# Patient Record
Sex: Female | Born: 1970
Health system: Southern US, Community
[De-identification: ages and names within clinical notes are randomized; demographics above are authoritative.]

## PROBLEM LIST (undated history)

## (undated) DIAGNOSIS — R569 Unspecified convulsions: Secondary | ICD-10-CM

## (undated) DIAGNOSIS — G43019 Migraine without aura, intractable, without status migrainosus: Secondary | ICD-10-CM

## (undated) DIAGNOSIS — J9600 Acute respiratory failure, unspecified whether with hypoxia or hypercapnia: Secondary | ICD-10-CM

## (undated) DIAGNOSIS — I6930 Unspecified sequelae of cerebral infarction: Secondary | ICD-10-CM

## (undated) DIAGNOSIS — G43409 Hemiplegic migraine, not intractable, without status migrainosus: Secondary | ICD-10-CM

## (undated) DIAGNOSIS — R51 Headache: Secondary | ICD-10-CM

## (undated) DIAGNOSIS — R519 Headache, unspecified: Secondary | ICD-10-CM

## (undated) DIAGNOSIS — I671 Cerebral aneurysm, nonruptured: Secondary | ICD-10-CM

## (undated) HISTORY — DX: Hemiplegic migraine, not intractable, without status migrainosus: G43.409

## (undated) HISTORY — PX: ABDOMINAL HYSTERECTOMY: SHX81

## (undated) HISTORY — DX: Migraine without aura, intractable, without status migrainosus: G43.019

## (undated) HISTORY — DX: Unspecified sequelae of cerebral infarction: I69.30

---

## 2005-09-29 ENCOUNTER — Inpatient Hospital Stay: Payer: Self-pay | Admitting: Obstetrics and Gynecology

## 2005-10-08 ENCOUNTER — Inpatient Hospital Stay: Payer: Self-pay | Admitting: Obstetrics and Gynecology

## 2006-04-27 ENCOUNTER — Emergency Department: Payer: Self-pay | Admitting: Emergency Medicine

## 2006-06-13 ENCOUNTER — Emergency Department: Payer: Self-pay | Admitting: Emergency Medicine

## 2006-06-17 ENCOUNTER — Emergency Department: Payer: Self-pay | Admitting: Emergency Medicine

## 2007-06-19 ENCOUNTER — Inpatient Hospital Stay: Payer: Self-pay | Admitting: Internal Medicine

## 2007-11-06 ENCOUNTER — Emergency Department: Payer: Self-pay | Admitting: Unknown Physician Specialty

## 2008-02-05 ENCOUNTER — Emergency Department: Payer: Self-pay | Admitting: Emergency Medicine

## 2008-02-06 ENCOUNTER — Inpatient Hospital Stay (HOSPITAL_COMMUNITY): Admission: EM | Admit: 2008-02-06 | Discharge: 2008-02-11 | Payer: Self-pay | Admitting: Neurology

## 2008-03-07 ENCOUNTER — Ambulatory Visit: Payer: Self-pay

## 2008-03-19 ENCOUNTER — Inpatient Hospital Stay (HOSPITAL_COMMUNITY): Admission: EM | Admit: 2008-03-19 | Discharge: 2008-03-22 | Payer: Self-pay | Admitting: Emergency Medicine

## 2008-04-30 ENCOUNTER — Emergency Department (HOSPITAL_COMMUNITY): Admission: EM | Admit: 2008-04-30 | Discharge: 2008-05-01 | Payer: Self-pay | Admitting: Emergency Medicine

## 2008-06-24 ENCOUNTER — Emergency Department: Payer: Self-pay | Admitting: Emergency Medicine

## 2008-06-24 ENCOUNTER — Other Ambulatory Visit: Payer: Self-pay

## 2008-08-15 ENCOUNTER — Encounter: Payer: Self-pay | Admitting: Neurology

## 2008-08-26 ENCOUNTER — Encounter: Payer: Self-pay | Admitting: Neurology

## 2008-09-25 ENCOUNTER — Encounter: Payer: Self-pay | Admitting: Neurology

## 2008-10-26 ENCOUNTER — Encounter: Payer: Self-pay | Admitting: Neurology

## 2008-11-02 ENCOUNTER — Ambulatory Visit: Payer: Self-pay

## 2008-11-09 ENCOUNTER — Ambulatory Visit: Payer: Self-pay | Admitting: Cardiovascular Disease

## 2008-11-20 ENCOUNTER — Ambulatory Visit: Payer: Self-pay

## 2008-11-20 ENCOUNTER — Encounter: Payer: Self-pay | Admitting: Cardiovascular Disease

## 2008-11-20 ENCOUNTER — Ambulatory Visit: Payer: Self-pay | Admitting: Cardiovascular Disease

## 2009-05-30 ENCOUNTER — Ambulatory Visit: Payer: Self-pay

## 2009-08-08 ENCOUNTER — Ambulatory Visit: Payer: Self-pay

## 2009-12-01 ENCOUNTER — Emergency Department: Payer: Self-pay | Admitting: Emergency Medicine

## 2009-12-20 ENCOUNTER — Ambulatory Visit: Payer: Self-pay

## 2010-02-01 IMAGING — CT CT HEAD W/O CM
1 series · 16 of 30 positions shown, 20 images · non-contrast
Comparison: MRI 02/06/2008 and head CT 02/06/2008

CLINICAL DATA: Intracranial hemorrhage.  Headache.

CT HEAD WITHOUT CONTRAST
TECHNIQUE: Contiguous axial images were obtained from the base of
the skull through the vertex without contrast.

[Series 2: head routine 4.8 h37s · axial · 0.43mm/px · z∈[+1245,+1378]mm · 16 of 30 slices shown, 20 images]
[im 2/30  brain]
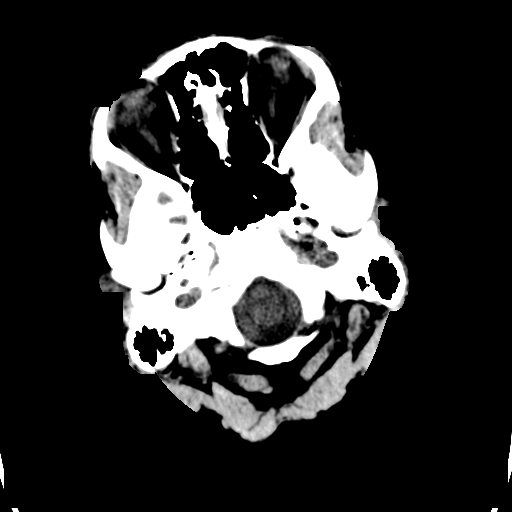
[im 2/30  bone]
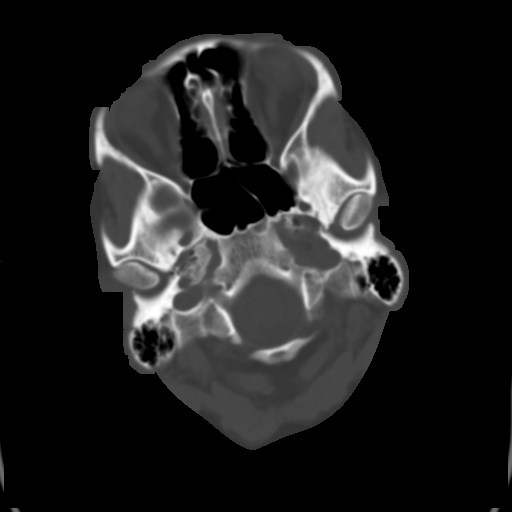
[im 4/30  brain]
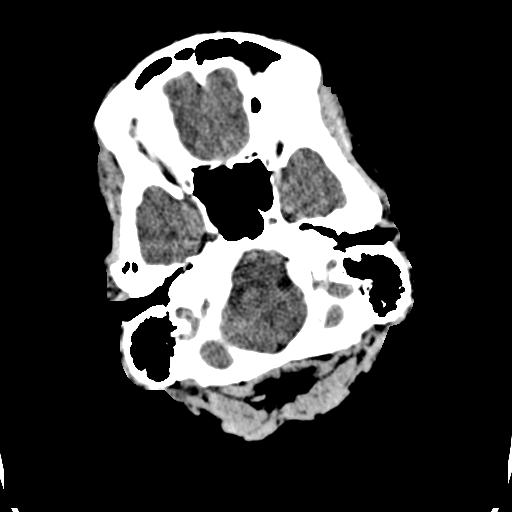
[im 6/30  brain]
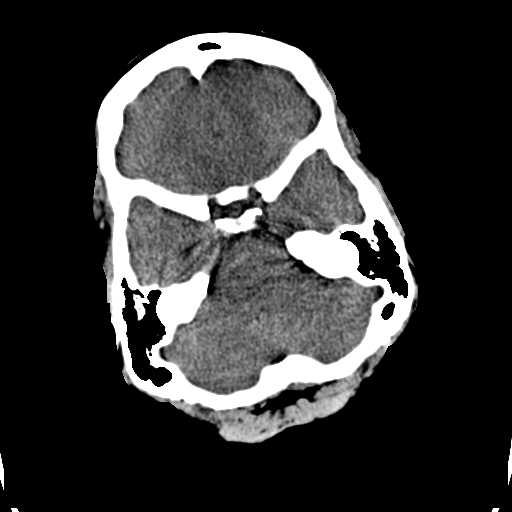
[im 8/30  brain]
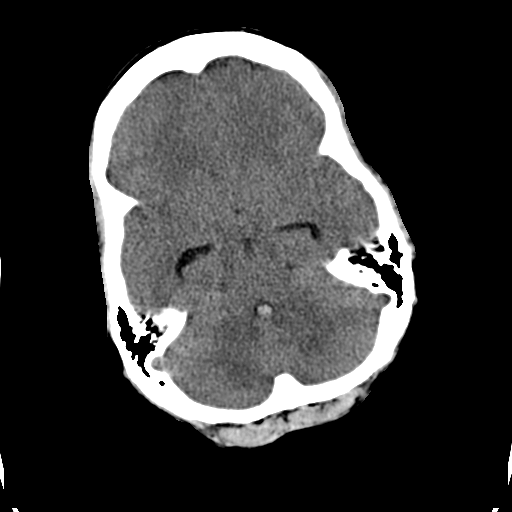
[im 9/30  brain]
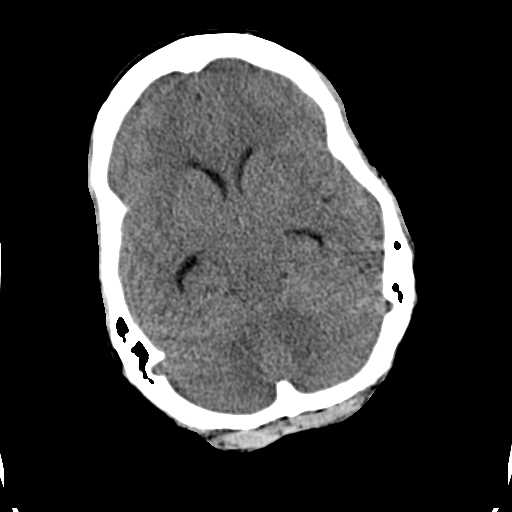
[im 9/30  bone]
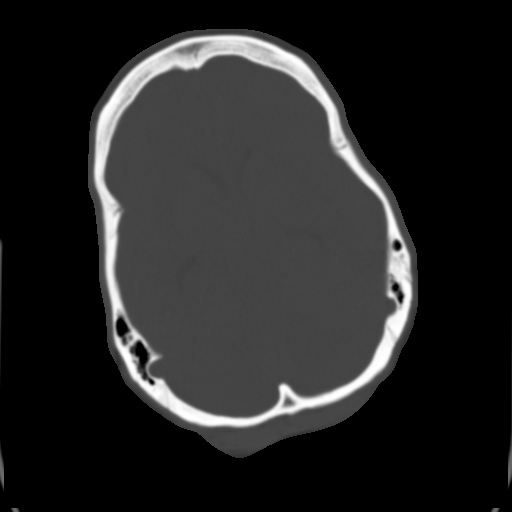
[im 11/30  brain]
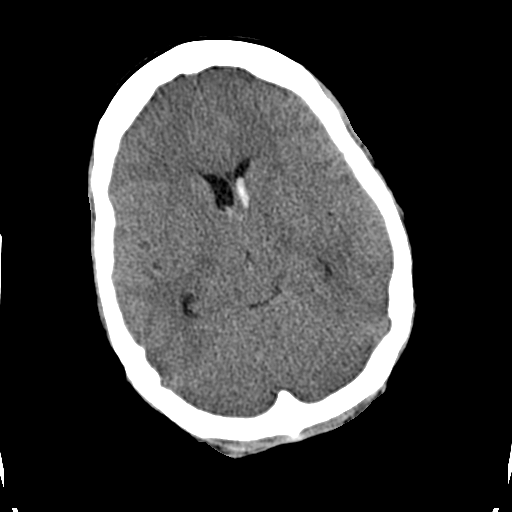
[im 13/30  brain]
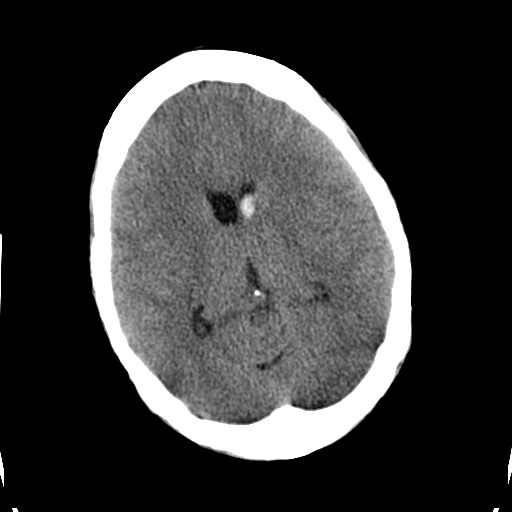
[im 15/30  brain]
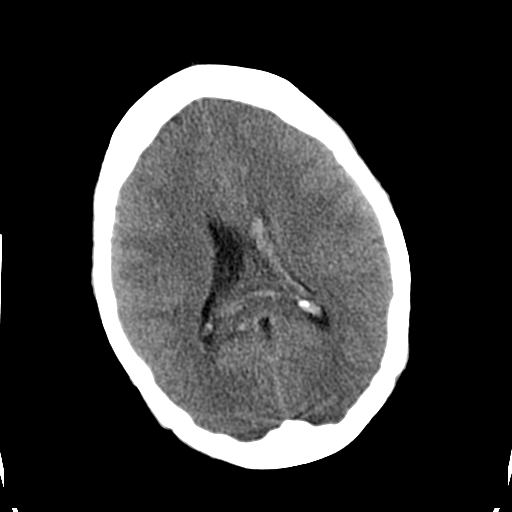
[im 16/30  brain]
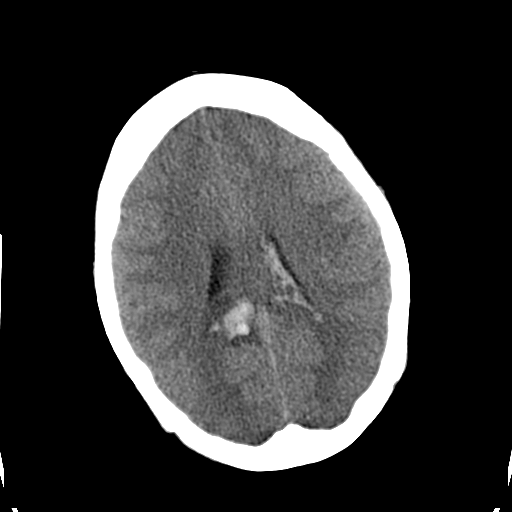
[im 16/30  bone]
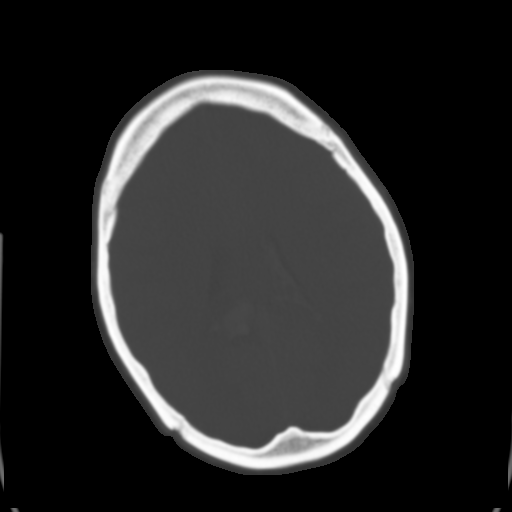
[im 18/30  brain]
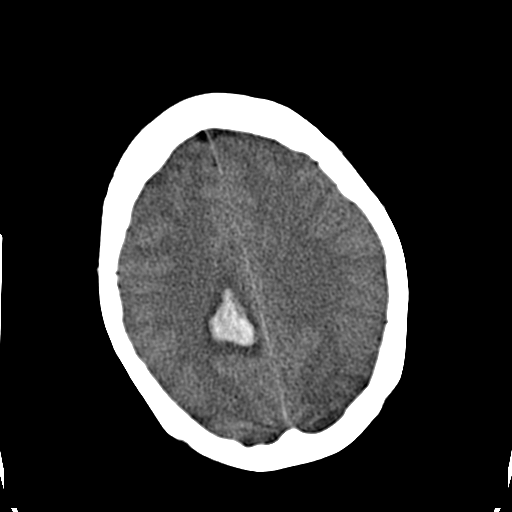
[im 20/30  brain]
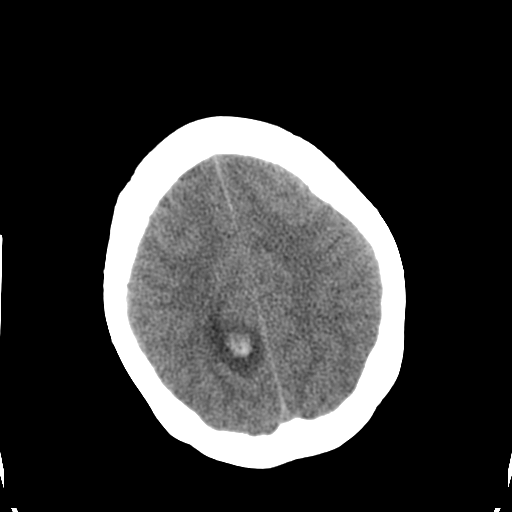
[im 22/30  brain]
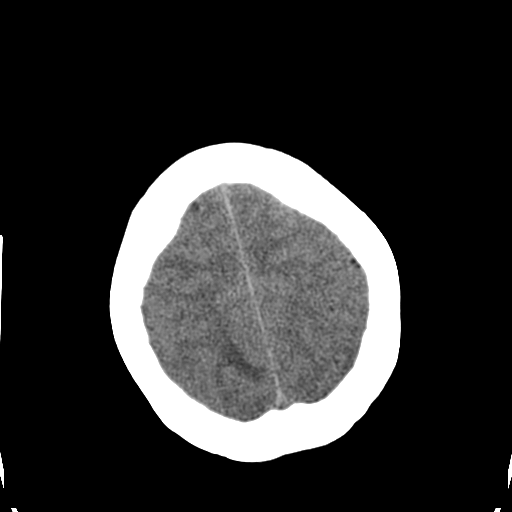
[im 23/30  brain]
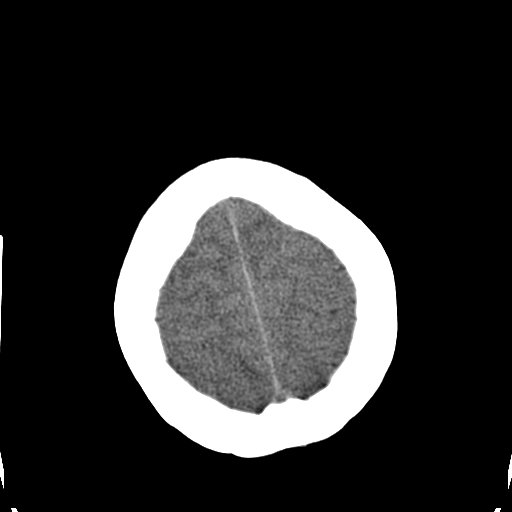
[im 23/30  bone]
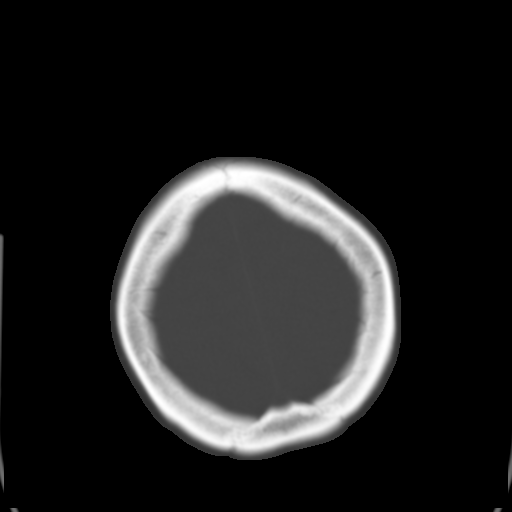
[im 25/30  brain]
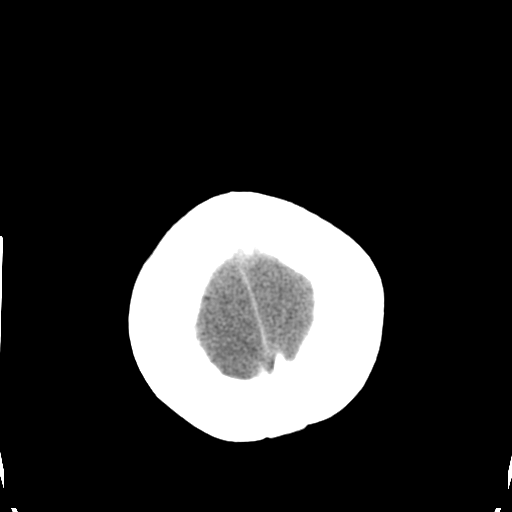
[im 27/30  brain]
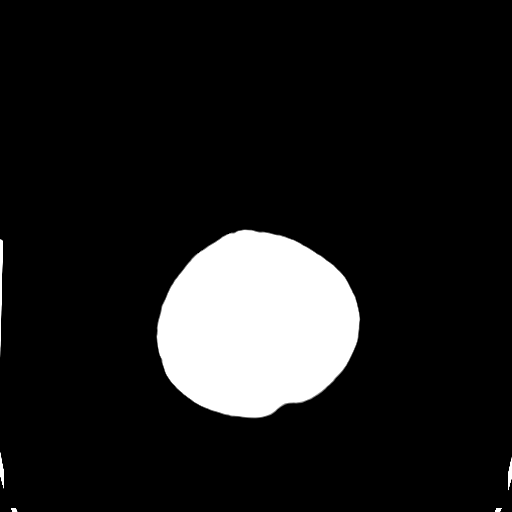
[im 29/30  brain]
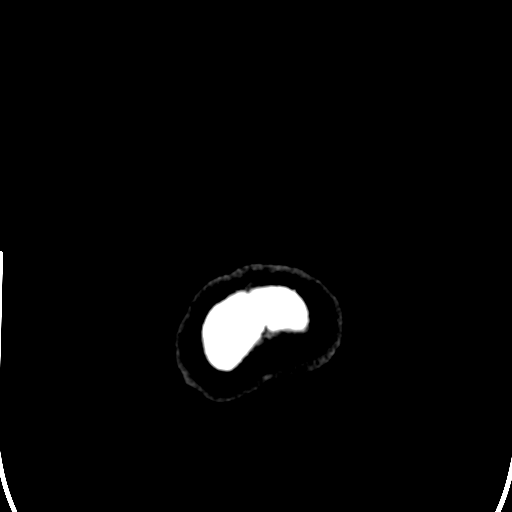

[16 of 30 positions shown; findings below may reference images not displayed]

FINDINGS: There is no sign of increased bleeding.  There is a 2.5 x
1.8 cm intraparenchymal hematoma in the right parietal region of
the deep brain, adjacent to and extending into the splenium of the
corpus callosum.  Surrounding vasogenic edema is slightly more
prominent.  Intraventricular blood appears to be undergoing
expected evolutionary change.  No additional intraventricular
bleeding.  I do think the lateral ventricles are very minimally
larger than were seen yesterday.  The remainder of the brain is
unremarkable.
IMPRESSION: No additional bleeding demonstrated

Slightly more surrounding vasogenic edema

Slight increase in size of the lateral ventricles.

## 2010-02-03 IMAGING — CT CT HEAD W/O CM
1 series · 16 of 28 positions shown, 20 images · non-contrast
Comparison: [DATE]

CLINICAL DATA: Follow-up intracranial hemorrhage

CT HEAD WITHOUT CONTRAST
TECHNIQUE: Contiguous axial images were obtained from the base of
the skull through the vertex without contrast.

[Series 2: brain · axial · 0.47mm/px · z∈[+135,+265]mm · 16 of 28 slices shown, 20 images]
[im 2/28  brain]
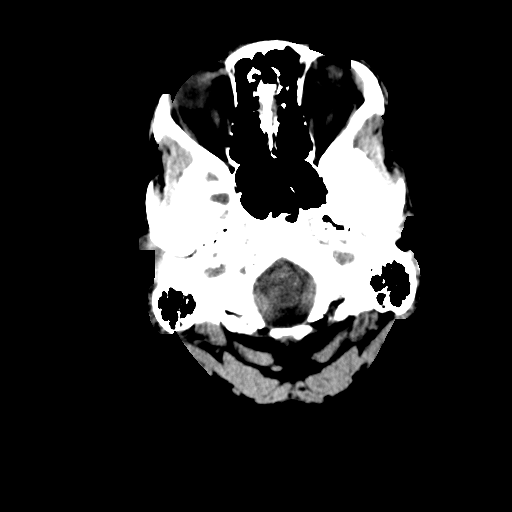
[im 2/28  bone]
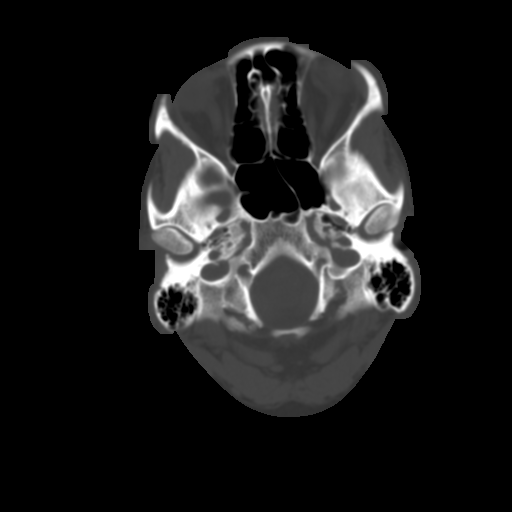
[im 4/28  brain]
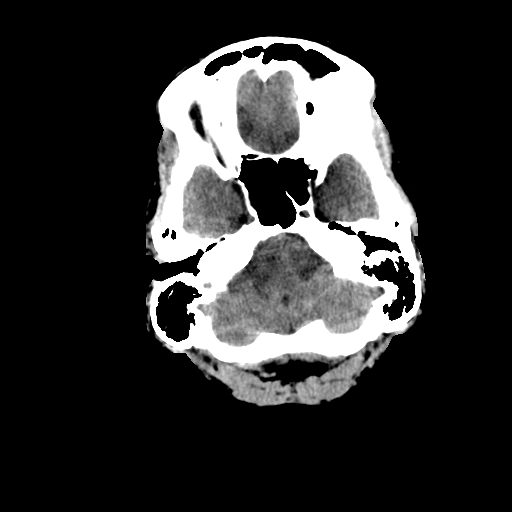
[im 6/28  brain]
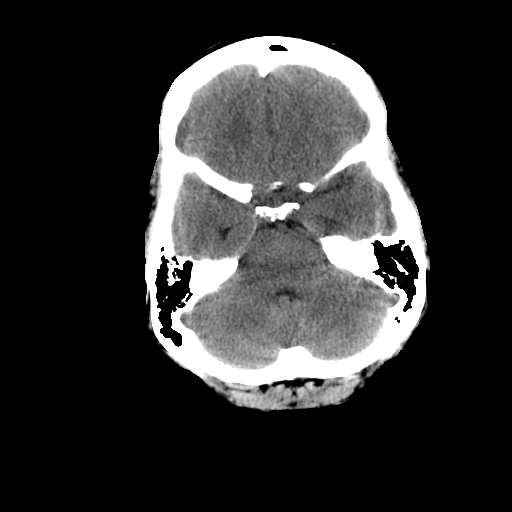
[im 7/28  brain]
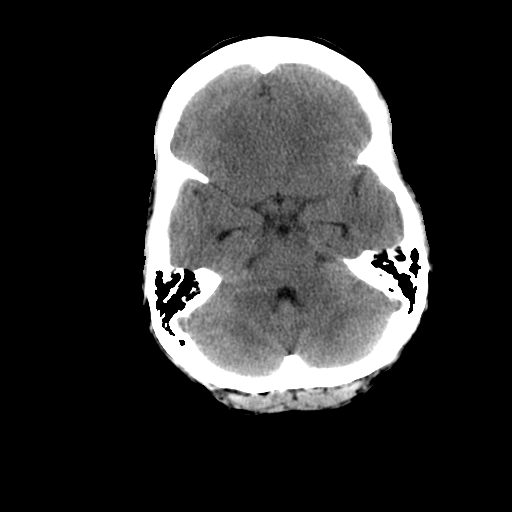
[im 9/28  brain]
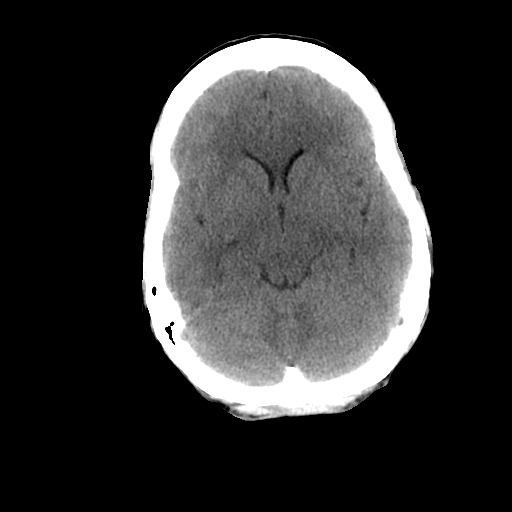
[im 9/28  bone]
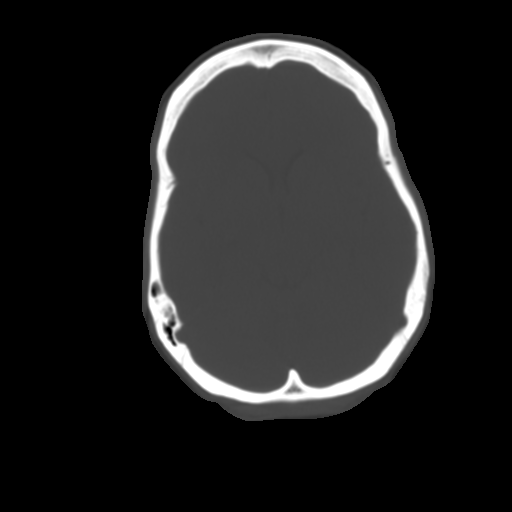
[im 10/28  brain]
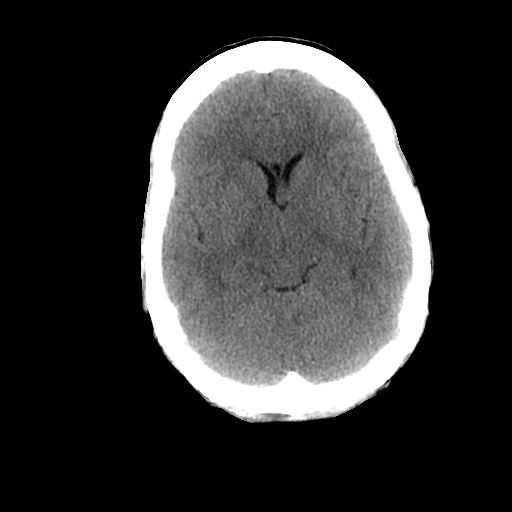
[im 12/28  brain]
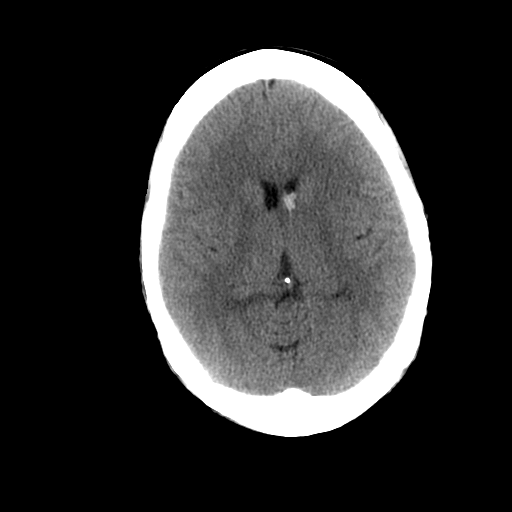
[im 14/28  brain]
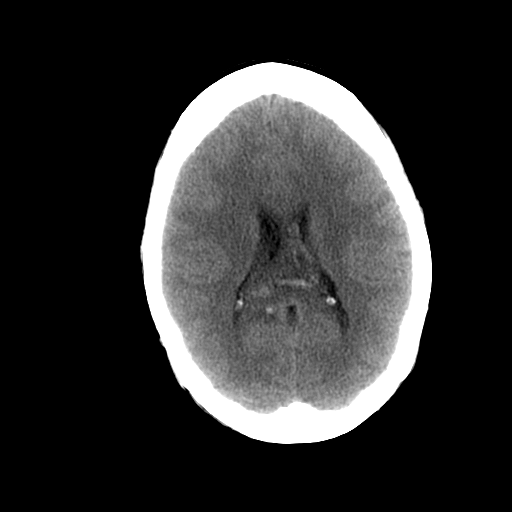
[im 15/28  brain]
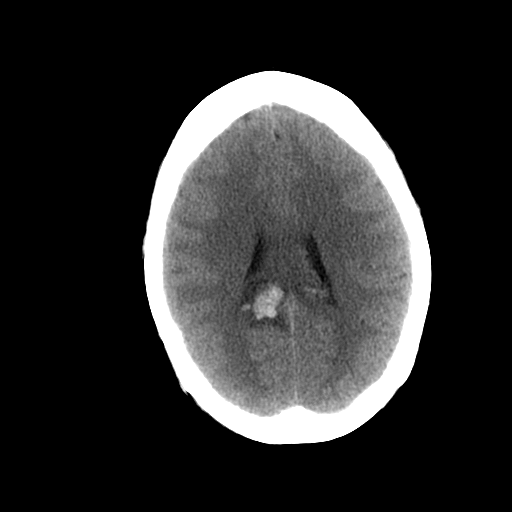
[im 15/28  bone]
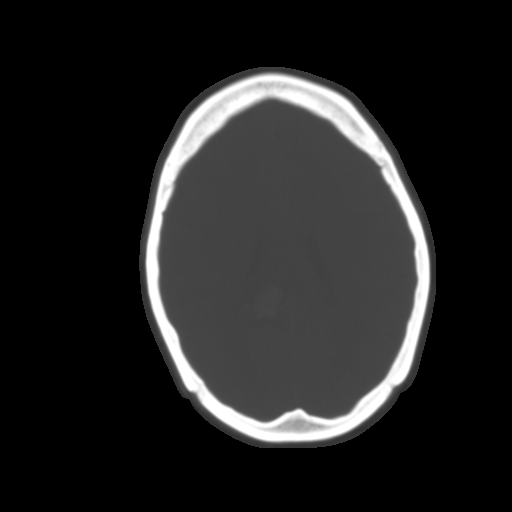
[im 17/28  brain]
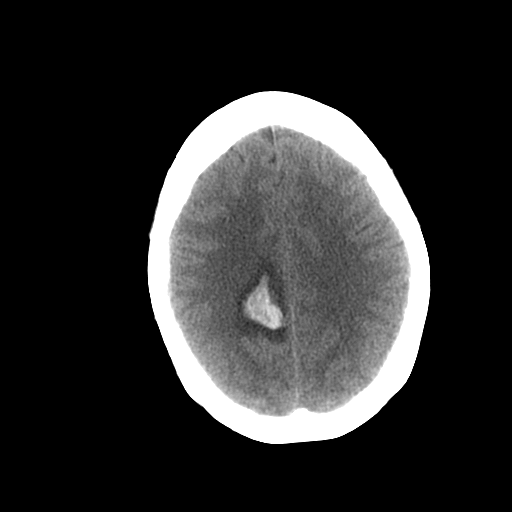
[im 19/28  brain]
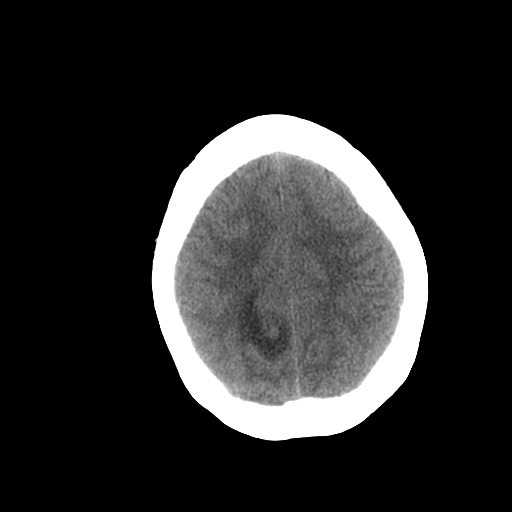
[im 20/28  brain]
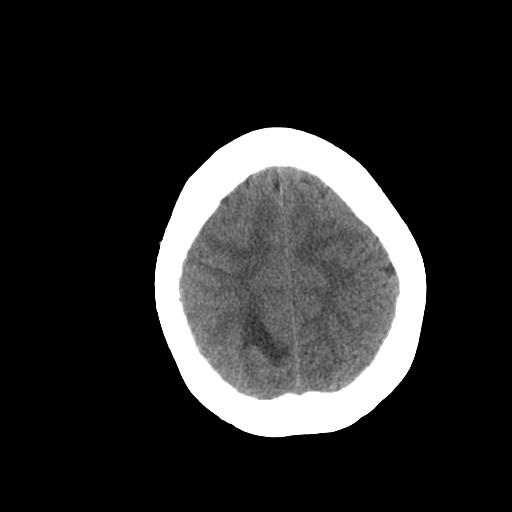
[im 22/28  brain]
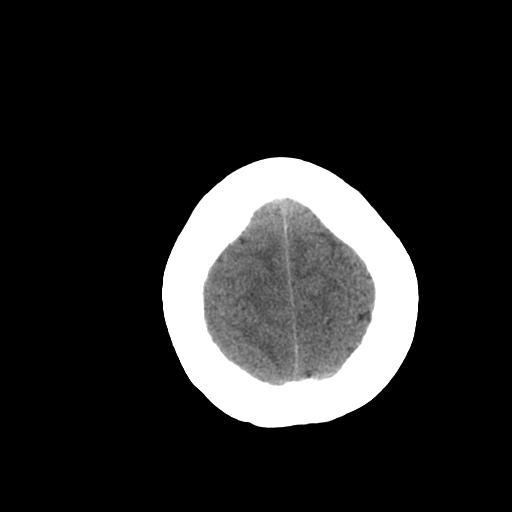
[im 22/28  bone]
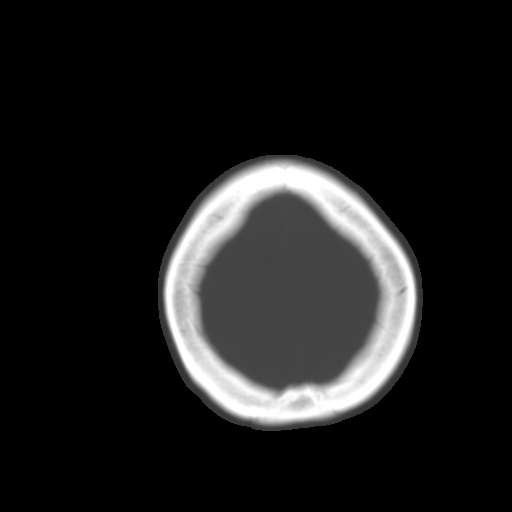
[im 23/28  brain]
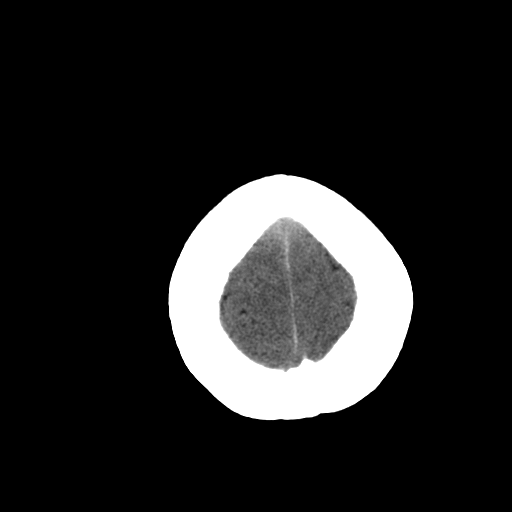
[im 25/28  brain]
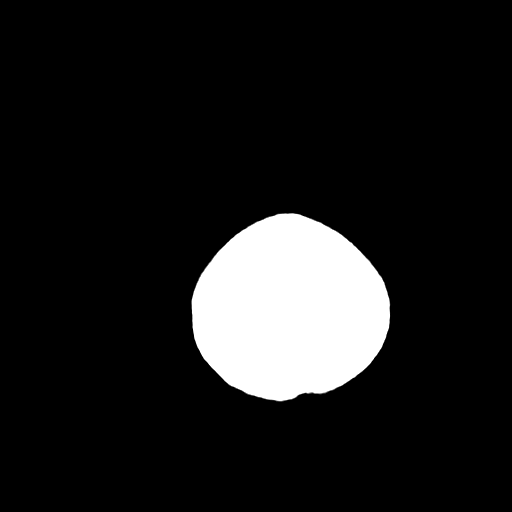
[im 27/28  brain]
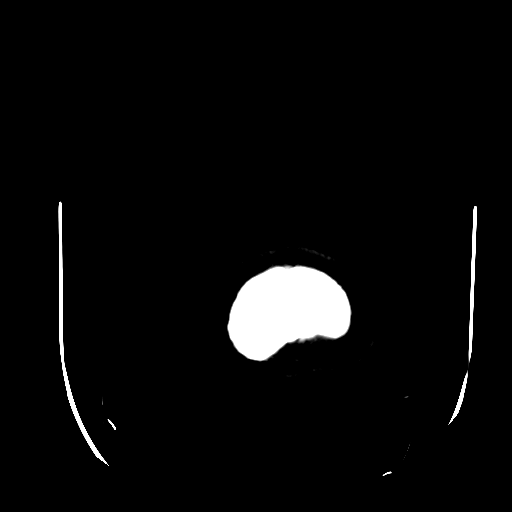

[16 of 28 positions shown; findings below may reference images not displayed]

FINDINGS: No additional intracranial bleeding is seen.  At 1.7 x
2.5 cm hematoma remains evident in the right parietal region
adjacent to and extending into the splenium of the corpus callosum.
Intraventricular blood is becoming less dense as expected.  Edema
surrounding hematoma is very minimally more prominent.  There is no
shift.  Ventricular size is slightly smaller than was seen 2 days
ago.  No new areas of hemorrhage.  No new areas of brain
abnormality demonstrable.
IMPRESSION: No increased bleeding.  Slightly more surrounding vasogenic edema.
Decrease in density of intraventricular  blood.  Slight decrease in
ventricular size since 2 days ago.

## 2010-10-07 ENCOUNTER — Emergency Department: Payer: Self-pay | Admitting: Emergency Medicine

## 2011-01-25 ENCOUNTER — Emergency Department: Payer: Self-pay | Admitting: Emergency Medicine

## 2011-03-10 NOTE — H&P (Signed)
[name], [name]                ACCOUNT NO.:  [id number]   MEDICAL RECORD NO.:  [id number]          PATIENT TYPE:  INP   LOCATION:  3111                         FACILITY:  [facility]   PHYSICIAN:  Casimiro Needle L. Reynolds, M.D.DATE OF BIRTH:  1971/06/18   DATE OF ADMISSION:  02/06/2008  DATE OF DISCHARGE:                              HISTORY & PHYSICAL   CHIEF COMPLAINT:  Intracerebral hemorrhage.   HISTORY OF PRESENT ILLNESS:  This is the initial [facility]  admission for this 40 year old woman with little past medical history.  The patient states she has a history of headaches and began developing  headache on Saturday, 2 days prior to admission, which was somewhat her  usual headaches.  However, this headache increased in intensity and  became severe at the back of the head, particularly when she would move  or cough.  A day later, she developed significant nausea and vomiting  more than usual.  Because of the progress and severity of headaches and  because there was no response to over-the-counter medication including  ibuprofen, she was seen in the emergency department at [facility] in the early morning hours of February 06, 2008.  She had CT of  the head there, which demonstrated acute intracerebral hemorrhage with  intraventricular extension and was transferred to Stroke Service at  [facility] for further management of this.  She presently is  complaining of headache, which worsens with movement and Valsalva, along  with some photophobia.  She is not nauseated at present.  She denies any  chest pain, shortness of breath, or any focal neurologic symptoms except  for some vague blurring of vision.   PAST MEDICAL HISTORY:  She has history of depression for which she has  been Zoloft.  She has told that she had migraines in the past and treats  them with over-the-counter medications.  Beyond that she denies chronic  medical problems.   FAMILY HISTORY:   Remarkable for a grandfather who died with a brain  aneurysm.   SOCIAL HISTORY:  No history of tobacco, alcohol, or drug use.   MEDICATIONS:  Zoloft and p.r.n. ibuprofen.   REVIEW OF SYSTEMS:  NEUROLOGIC:  As above.  GI:  Nausea and vomiting as above.  PSYCHIATRIC HISTORY:  Depression as above.  CV, PULMONARY, GU, MUSCULOSKELETAL, ENDOCRINE, HEME, LYMPH, ALLERGIC:  All negative.   ALLERGIES:  No known drug allergies.   PHYSICAL EXAMINATION:  VITAL SIGNS:  Temperature 98.4, blood pressure  115/60, pulse 80, respirations 16, and O2 sat 99% on room air.  GENERAL:  This is a healthy-appearing woman supine on hospital bed,  uncomfortable due to pain, but in no respiratory distress.  HEAD:  Cranium is normocephalic and atraumatic.  Oropharynx is benign.  NECK:  Supple without carotid or supraclavicular bruits.  HEART:  Regular rate and rhythm without murmurs.  NEUROLOGIC:  Mental status, she is drowsy, but alert diffusely to voice.  She is fully oriented to time and place.  She is able to name objects,  repeat phrases without difficulty.  There is no aphagia  or dysarthria.  Cranial nerves, pupils are equal and brisk, clearly reactive.  Extraocular movements full.  No nystagmus.  Face, tongue, and palate  moved normally and symmetrically.  Facial sensation is intact.  Motor,  normal bulk and tone.  Normal strength in all tested extremity muscles.  Sensation is intact to light touch in all extremities.  Reflexes are 2+  and symmetric.  Toes are downgoing bilaterally.   LABORATORY DATA:  She has had no laboratory work done up to this point.  CT of the head is personally reviewed that is remarkable for an  approximately 2 x 2-cm acute hemorrhage in the right frontoparietal  pericallosal area with extension into the ventricles, left greater than  right.  A little bit of blood in the third and fourth ventricles.  There  is no evidence of hydrocephalus at this point.  There is no midline   shift.   IMPRESSION:  Acute intracerebral hemorrhage with associated headache,  nausea, and vomiting but without focal neurologic findings at this time.   PLAN:  Admit to the ICU for close observation.  We will need serial  scans to exclude hydrocephalus. After the results, will consult  neurosurgery.  She will need a workup for the source of this ICH,  including MRI, consider angiogram.  Stroke service will follow.      Michael L. Thad Ranger, M.D.  Electronically Signed     MLR/MEDQ  D:  02/06/2008  T:  02/06/2008  Job:  [id number]

## 2011-03-10 NOTE — Assessment & Plan Note (Signed)
[facility]                            CARDIOLOGY OFFICE NOTE   [name], [name]                       MRN:          [id number]  DATE:11/09/2008                            DOB:          [birth year]    A 40 year old patient referred by Elveria Rising, Dr. Marlis Edelson nurse  practitioner, for shortness of breath, chest pain, and question of  syncope.  Unfortunately, the patient has had a recent parietal  hemorrhage.  Apparently, she had a four-vessel angiogram by Dr.  Corliss Skains which did not show any AVM.  This occurred on February 06, 2008.  She continues to have rehab for this and has residual left-sided  weakness.  She has been having recurrent headaches.  In the setting of  one of these recurrent headaches, she passed out.  Apparently, her  boyfriend stated there was some jerking motion.  She recovered in about  10-15 minutes and had a residual headache and some left-sided weakness.   There has been some issues regarding her medications in regards to  compliance.  Her Depakote levels have been low.  She has had a positive  alcohol level since she continues to smoke.   The patient complains of chest pain that sounds more musculoskeletal.  It hurts with a deep breath.  There has been no associated cough.  She  has significant shortness of breath with PND and orthopnea.  There has  been no lower extremity edema.  Some of the pain radiates to the left  shoulder, but she appears to have a separate musculoskeletal issue  there.  She has a bit of a frozen shoulder and still cannot lift her  left arm above her head.  There is significant pain with physical  therapy.   In talking to the patient, she has not had a previous cardiac problems.  She has not had previous chest pain outside of about a week ago.  Shortness of breath has been building for 2-3 weeks.   Prior to her stroke, she had been active.  She had not been having any  significant PND or  orthopnea.  There was no history of hypertension,  cardiomyopathy, or syncope.   The patient had an EEG performed on December 31, which was normal with  no seizure focus, despite documented seizures.  Apparently, there was a  Holter monitor order.  I do not have these results.   REVIEW OF SYSTEMS:  Remarkable for headaches and twitching in the left  side of her face.  The patient gets occasional nausea after her  headaches and there has also been some blurry vision in the left eye.   Otherwise, she is also felt some frequent urination and easy  bruisability.   ALLERGIC:  She is allergic to ROCEPHIN.   CURRENT MEDICATIONS:  1. Depakote 500 mg tabs 2 at bedtime.  2. Neurontin 100 b.i.d. p.r.n.  3. Excedrin.  4. Nortriptyline 50 nightly.  5. Diclofenac 75 b.i.d.   FAMILY HISTORY:  Remarkable for mother apparently having an aneurysm.  The patient is single.  She has a boyfriend.  She smokes  about a pack a  day.  She does drink alcohol.  She has 3 children, who are 7, 11, and  13.  She is currently not working and she denies other drugs.  She is  fairly sedentary and has not been back to a rehab since her new symptoms  have developed.   PAST MEDICAL HISTORY:  Otherwise, remarkable for having had a previous  hysterectomy and tubal ligation, and of course her cerebral hemorrhage  in April 2009.   PHYSICAL EXAMINATION:  GENERAL:  Remarkable for a young black female in  no distress.  VITAL SIGNS:  Weight is 173; blood pressure 130/80; pulse is 95 and  regular; and respiratory rate 14, afebrile.  HEENT:  Unremarkable.  LUNGS:  Carotids are normal without bruit.  No lymphadenopathy,  thyromegaly, or JVP elevation.  LUNGS:  Clear, good diaphragmatic motion.  No wheezing.  HEART:  S1 and S2.  No obvious murmur.  PMI not palpable.  ABDOMEN:  Benign.  Bowel sounds positive.  No AAA.  No tenderness.  No  bruit.  No hepatosplenomegaly or hepatojugular reflux. EXTREMITIES:  Distal pulses  are intact.  No edema.  NEURO:  Remarkable for somewhat contracted and weak left upper  extremity.  She is unable to AV duct her arm above her shoulder.  She  also has left lower extremity weakness.   Her electrocardiogram shows sinus tachycardia at a rate of 100, but is  otherwise normal.   LABORATORY DATA:  Review of her lab work that was done on January 5 and  her potassium was 3.8 and BUN was 15.  LFTs were normal.  Valproic acid  level was low.  I reviewed EEG report, unremarkable with no seizure  focus.  Holter monitor report to be reviewed, but need to obtain first.   IMPRESSION:  1. Dyspnea.  The patient's cardiac exam is not that abnormal outside      of tachycardia.  She will have a 2-D echocardiogram to assess right      ventricular and left ventricular function.  2. Chest pain.  The patient's chest pain is likely musculoskeletal in      related to her frozen left shoulder.  There is a bit of a pleuritic      component to it in the setting of a previous cerebral hemorrhage,      which could denote endovascular disease.  I think it is reasonable      to proceed with an adenosine Myoview study.  Her resting heart rate      is too high for dobutamine echo and she certainly cannot walk on a      treadmill.  3. Cerebrovascular accident.  Follow up with Neurology.  It appears      that there is some issues regarding compliance with her      medications, low Depakote levels, and ongoing drinking and smoking.  4. Smoking cessation.  Counseled the patient for less than 10 minutes      regarding smoking cessation.  Given her recent cerebral hemorrhage      and Depakote use, I do not think she is an ideal candidate for      Chantix.  She currently does not seem very motivated to quit given      her recent cerebrovascular accident.  There are multiple issues      regarding her coping mechanisms.  5. Questioned syncope versus seizure.  I think there is a low  likelihood that she  passed out from a cardiac issue.  Further      recommendations on this will be based on her left ventricular      function and workup for ischemia.  Her QT interval on EKG is only      346 and given the description of seizure activity during this      passing out episode, I think it is much more related to her recent      hemorrhagic event and residual neurological deficits.   I will see her back after her echo and Myoview.     Noralyn Pick. Eden Emms, MD, [facility]  Electronically Signed    PCN/MedQ  DD: 11/09/2008  DT: 11/09/2008  Job #: [id number]

## 2011-03-10 NOTE — Consult Note (Signed)
Hannah, Collins NO.:  [id number]   MEDICAL RECORD NO.:  [id number]          PATIENT TYPE:  INP   LOCATION:  2631                         FACILITY:  [facility]   PHYSICIAN:  Marlan Palau, M.D.  DATE OF BIRTH:  1971-03-10   DATE OF CONSULTATION:  03/19/2008  DATE OF DISCHARGE:                                 CONSULTATION   HISTORY OF PRESENT ILLNESS:  [name] is a 40 year old black female  born on November 22, 1970, with a history of a right parietal  intracranial hemorrhage with interventricular extension that occurred  around February 06, 2008.  The patient was on the stroke services at that  time, and underwent a thorough workup looking for the cause of the  hemorrhage which was not found.  This patient underwent an MRI scan of  the brain and underwent a cerebral angiogram.  All studies not  delineating the cause of the hemorrhage.  The patient was felt to have a  history of migraine headaches and was discharged on Depakote taking 500  mg twice daily, Zoloft 100 mg a day, and Ultram 100 mg q.6 h if needed.  The patient was at home and was noted to have some confusion and  drowsiness.  The patient was brought into the emergency room for  evaluation of an altered mental status.  In the emergency room, the  patient was noted to have a tonic-clonic episode consistent with a  seizure.  The patient had a fall prior to the seizure-type event.  The  patient was admitted for further evaluation.  A CT scan of the head  showed a resolving right brain intracranial hemorrhage.  No acute  changes were seen.  Neurology was asked to see this patient for further  evaluation.  Valproic acid levels were less than 10.  Alcohol level was  133.   Urine drug screen otherwise was negative.   PAST MEDICAL HISTORY:  Significant for:  1. Right carotid intercranial hemorrhage on February 06, 2008.  2. 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  3. Migraine.  4.  Depression.   ALLERGIES:  This patient is allergic to ROCEPHIN.  Smokes half a pack of  cigarettes daily.  Drinks alcohol on occasion.   SOCIAL HISTORY:  This patient lives in [place], [place] area.  Is not married.  Has three children.  The patient is not employed.  Lives with her children.   FAMILY HISTORY:  Notable for that both parents are alive.  Father has a  history of hypertension and diabetes.  Mother is alive and well.  The  patient has a lot of brothers and sisters.  There is a family history  of hypertension.  Currently, there is a grandfather who died with a  brain aneurysm in the past, and a history of stroke in the family.   REVIEW OF SYSTEMS:  Notable for no recent fevers or chills.  The patient  does note headache.  The patient denies shortness of breath, chest  pains, and abdominal pain.  Does note some left-sided face, arm, and leg  numbness since the intracranial hemorrhage.  Has been using a walker to  get around.   PHYSICAL EXAMINATION:  VITALS:  Blood pressure is 96/44, heart rate 89,  respiratory rate is 19, and temperature afebrile.  GENERAL:  This patient is a fairly well-developed black female who is  sleepy but will arouse and conversant at the time examination.  HEENT:  Head is atraumatic.  Eyes, pupils are round and reactive to  light.  Disks are flat bilaterally.  NECK:  Supple.  No carotid bruits noted.  RESPIRATORY:  Clear.  CARDIOVASCULAR:  Regular rate and rhythm.  No obvious murmurs or rubs  noted.  EXTREMITIES:  Without significant edema.  NEUROLOGIC:  Cranial nerves as above.  Facial symmetry is present.  The  patient has decreased pinprick sensation on left face.  The right  extraocular movements are full.  Visual fields are full.  The patient  has good symmetry with smile.  Motor testing was good strength in all  fours.  Good symmetric motor is noted throughout.  No drift is seen with  upper extremities.  The patient has decreased  pinprick, soft touch, and  vibratory sensation in the left arm and left leg.  The patient has fair  finger-nose-finger bilaterally and total finger bilaterally.  Gait was  not tested.  Deep tendon reflexes remained symmetric.  Toes down going  bilaterally.   LABORATORY VALUES:  Notable for a negative urine drug screen.  Alcohol  level 133.  Urinalysis reveals specific gravity of 1.008, pH is 6.5, 40  mg/dL ketones, and 4-09 red cells.  The patient has a Depakote level  less than 10, has sodium 148, potassium 3.2, chloride of 114, CO2 21,  glucose of 84, BUN of 3, creatinine 0.57 a total bili 0.7, alk  phosphatase of 44, SGOT of 20, SGPT of 12, total protein 6.1, albumin  3.5, calcium 9.0, phosphorus 2.4, and magnesium 2.1.  CK of 64.   IMPRESSION:  1. Right parietal intracranial hemorrhage on February 06, 2008.  2. Seizures secondary to right intercranial hemorrhage.   This patient had been drinking prior to this admission, also, may have  been on some Ultram for pain, both to lower the seizure threshold in  combination with the prior neurologic deficit.  The patient did have  subarachnoid blood with interventricular extension to  prior hemorrhage.  The patient is on Depakote.  The levels are very, very low and the  patient may not have been taking the medication at home.  We will need  to recheck the levels.  Now the patient is on a maintenance dose of 500  mg 3 times daily.  EEG study has been ordered, not yet done.  We will  follow the patient's clinical course while in-house.  To thank you very  much dictation please.      Marlan Palau, M.D.  Electronically Signed     CKW/MEDQ  D:  03/19/2008  T:  03/19/2008  Job:  [id number]   cc:   [facility]

## 2011-03-10 NOTE — H&P (Signed)
[name], [name]                ACCOUNT NO.:  [id number]   MEDICAL RECORD NO.:  [id number]          PATIENT TYPE:  INP   LOCATION:  2106                         FACILITY:  [facility]   PHYSICIAN:  Mobolaji B. Bakare, M.D.DATE OF BIRTH:  [birth year]   DATE OF ADMISSION:  03/18/2008  DATE OF DISCHARGE:                              HISTORY & PHYSICAL   PRIMARY CARE PHYSICIAN:  Unassigned.   CHIEF COMPLAINT:  Altered mental status.   HISTORY OF PRESENTING COMPLAINT:  The patient is unable to give any  history because of changes in her mental status.  According to the  emergency room chart, she is a 40 year old lady who was brought to the  emergency room by EMS for altered mental status.  The family called EMS  because the patient's behavior changed earlier yesterday and she had a  fall.  She initially resisted coming to the hospital, but eventually the  family prevailed and they called EMS.  The patient was brought to the  emergency room for further evaluation.  EMS was called twice today; the  first call was  earlier in the day after the fall.  Upon evaluation by  EMS, the patient resisted coming to the emergency room.  EMS again was  called later in the evening because she was not acting right.  This time  the patient was unresponsive and only responsive to painful stimuli.  There was no reported seizure at that time.  She was brought to the  emergency room for further evaluation.   Upon arrival, initial vitals were:  Temperature 97.6, blood pressure  113/70, pulse 96, respiratory rate 14, O2 saturations 100%.  She had a  head CT scan which showed no acute intracranial abnormality.  Of note,  is that the patient had a right parietal infarct hemorrhagic stroke in  April 2009.   During the course of stay in the emergency room, she was noted to have  seizure-like activity which was tonic-clonic; with head shaking and a  fixed gaze.  Pupils were normal at that time.  There was no arm or  leg  shaking; no incontinence or tongue biting.  She received Ativan and the  head and neck movements stopped.   The patient was arousable to sternal rub.  She opened her eyes and when  asked as to what she drank, she responded by saying juice.   Further evaluation revealed alcohol level of 133.  She had a neck  cervical CT scan which was unremarkable.  We have been asked to admit  for further stabilization.   REVIEW OF SYSTEMS:  Unobtainable.   PAST MEDICAL HISTORY:  1. Most recently the patient had right parietal intracerebral      hemorrhage with intraventricular extension in April 2009.  Workup      was negative at that time.  2. History of migraine headaches.  3. History of depression..   CURRENT MEDICATIONS:  1. Depakote 500 mg b.i.d.  2. Tramadol 100 mg q.6 h. p.r.n.  3. Trazodone 100 mg one-half to one tablet nightly p.r.n.   FAMILY HISTORY:  Obtained  from the chart record; was remarkable for  grandfather who died from brain aneurysm.   SOCIAL HISTORY:  Unobtainable.   The patient is arousable to sternal rub.  She opens her eyes, and when  asked as to what she drank, she responded by saying juice.Marland Kitchen   PHYSICAL EXAMINATION:  CURRENT VITALS:  Temperature 97.6 , blood  pressure 110/68.  Heart rate of 94, respiratory rate of 16-18.  Oxygen  saturations 99% on oxygen.  On examination the patient was arousable to  sternal rub.  Pupils were 4 mm, equal and reactive to light.  Mucous  membranes were moist.  Not in respiratory distress.  No elevated JVD.  No carotid bruit.  LUNGS: Clear clinically to auscultation.  CVS:  S1 and S2 regular; no murmur.  ABDOMEN:  Not distended; soft and nontender.  Bowel sounds present.  EXTREMITIES:  No pedal edema or calf tenderness.  Dorsalis pedis pulses  palpable bilaterally.  SKIN:  No rash or petechiae.  CNS:  The patient was able to move all limbs.  No apparent focal  deficit.  The CNS examination was difficult due to the diminished  level  of consciousness.   INITIAL LABORATORY DATA:  Depakote/Depakene level less than 10.  Lipase  18.  Alcohol level 133.  Sodium 148, potassium 3.2, chloride 114, bicarb  21, glucose 84, BUN 3, creatinine 0.57.  Total bilirubin 0.7, alkaline  phosphatase 44, AST 20, ALT 12.  Total protein 6.1, albumin 3.5, calcium  9.0.  Urine drug screen negative for cannabis, opiates, cocaine,  benzodiazepine, barbiturates, amphetamines.  White cell 11.3, hemoglobin  11.7, hematocrit 34.9, platelets 225, neutrophils 8.5.  Urinalysis:  Red  blood cells 7-10, nitrites negative, leukocyte esterase negative.  Urine  pregnancy test negative.   A CT scan of the chest showed old right parietal infarct from previous  hemorrhage with no acute abnormality.  The cervical spine x-ray showed  mild scoliosis and minimal reversal of the normal cervical lordosis; no  fracture subluxation.  A chest x-ray showed no acute cardiopulmonary  disease.   EKG:  Shows normal sinus rhythm with prolonged QTC interval of 81.   ASSESSMENT AND PLAN:  [name] is a 40 year old African American female  presenting with altered mental status; abnormal behavior sometimes  yesterday, with resultant fall.  She had a nonacute head CT scan upon  evaluation in the emergency room.  She was noted to have an alcohol  level of 136.  The patient is currently arousable to painful stimulus.  She was able to protect here airway and she was not in respiratory  distress.   ADMISSION DIAGNOSES:  1. Altered mental status, secondary to alcohol intoxication.  2. Seizure-like activity noted in the emergency room; probably      secondary to alcohol intoxication.  3. Hypokalemia.  4. Hyponatremia.  5. Prolonged QTC interval.  6. History of intracerebral hemorrhage in April 2009.  7. History of migraine headaches.   PLAN:  Will admit to step-down unit.  Hydrate with IV fluids; D5 half  normal saline at 150 mL/hour.  Thiamine 100 mg IV daily.   Folate 1 mg IV  daily.  We will watch for alcohol withdrawal.   The patient has received Depakote 1000 mg loading dose in the emergency  room.  We will continue with Depakote 500 mg IV q.8 h.; we will ask  pharmacy to dose.  It is unclear if she was using Depakote for migraine  headaches or if she had  seizures following the intracranial bleed in  April 2009.  We will check Tylenol, salicylate and tricyclic acid  levels.  We will place her on seizure precautions.  Will check magnesium  level and correct hypokalemia.  Will repeat EKG again in the morning.  Will give Protonix 40 mg IV for GI prophylaxis and a sequential  compression device for DVT prophylaxis.  Will  do neurological checks  q.2 h.      Mobolaji B. Corky Downs, M.D.  Electronically Signed     MBB/MEDQ  D:  03/19/2008  T:  03/19/2008  Job:  [id number]   cc:   Pramod P. Pearlean Brownie, MD

## 2011-03-10 NOTE — Discharge Summary (Signed)
[name], [name]                ACCOUNT NO.:  [id number]   MEDICAL RECORD NO.:  [id number]          PATIENT TYPE:  INP   LOCATION:  3031                         FACILITY:  [facility]   PHYSICIAN:  Pramod P. Pearlean Brownie, MD    DATE OF BIRTH:  Jul 27, 1971   DATE OF ADMISSION:  02/06/2008  DATE OF DISCHARGE:  02/10/2008                               DISCHARGE SUMMARY   DIAGNOSES AT TIME OF DISCHARGE:  1. Right parietal intracerebral hemorrhage with intraventricular      extension, etiology unknown, workup thus for negative.  2. Headache secondary to intercerebral hemorrhage.  3. Question urinary tract infection.  Repeat urinalysis and culture      pending at time of dictation.  4. Depression.  5. Migraine.   MEDICINES AT TIME OF DISCHARGE:  1. Zoloft 100 mg a day.  2. Depakote 500 mg b.i.d.  3. Tramadol 100 mg q.6 h. p.r.n.   STUDIES PERFORMED:  1. CT of the brain on admission shows a high right parietal hematoma      with intraventricular extension.  MRI of the brain showed a 2.7 x      2.1 x 2.0-cm hematoma in the right parietal occipital region with      extension into the splenium of the corpus callosum and mild      surrounding edema, intraventricular penetration without      hydrocephalus, and no underlying lesion definable.  2. MRA of the head was negative.  CT of the brain followup at 24 hours      shows slightly more surrounding vasogenic edema, no additional      bleeding, and slight increase in size of lateral ventricles.  3. CT of the brain at 3 days shows no increased bleeding, slightly      more surrounding vasogenic edema, decrease in density of      intraventricular blood, and slight decrease in ventricular size.  4. Cerebral angiogram shows no evidence of early AV shunting to      suspect an AV malformation, dural AV fistula, or dissections      intracranially or extracranially.  No evidence of intracranial      aneurysms or spasms.  Venous outflow is within normal  limits.  5. A 2-D echocardiogram was not performed.  Carotid Doppler not      performed.  EKG shows sinus bradycardia with sinus arrhythmia.   LABORATORY STUDIES:  Urinalysis on admission showed 0-2 white blood  cells, 11-20 red blood cells, and rare bacteria.  Repeat urinalysis on  the February 08, 2008, shows many bacteria, white blood cells and red blood  cells too numerous to count, though the urinalysis repeated on the February 10, 2008, again was 0-2 white blood cells, 7-10 red blood cells, and few  bacteria.  Hypercoagulable panel, total protein C 73, functional protein  C 120, total protein S 94, and functional protein S 85.  Lupus  anticoagulant, not detected.  Homocystine 5.9.  Beta 2-glycoprotein  negative.  Cardiolipin antibody negative.  Negative factor V Leiden.  CA-  50 of 57.  ANA negative.  C4 of 23, C3 of 130, sickle cell screen  negative, and sed rate 6.  Chemistry with glucose 101, BUN 5, creatinine  0.52, and otherwise normal. Coagulation studies, normal.  CBC on  admission with white blood cells 15.8 dropped to 14.8 and hemoglobin  11.2   HISTORY OF PRESENT ILLNESS:  [name] is a 40 year old African  American female with little past medical history.  She states she has a  history of headaches and began developing the headache on Saturday 2  days prior to admission, which was somewhat her usual headache.  However, this headache increased in intensity and became severe at the  back of her head particularly when she would move or cough.  A day  later, she developed significant nausea and vomiting.  Because of the  progress and severity of headache and there was no response to over-the-  counter medication including ibuprofen, she was staying in the emergency  department at [facility].  There a CT of the head  demonstrated an acute intracerebral hemorrhage with intraventricular  extension, and she was transferred to [facility]  for further management.   HOSPITAL COURSE:  CT remained stable throughout hospitalization with no  significant increase in hemorrhage size, though there was normal  evolutionary changes of increasing edema.  The patient had significant  headache for which she was given morphine and Dilaudid.  Secondary to  the morphine and Dilaudid, the patient developed nausea and vomiting.  Nausea and vomiting subsided when morphine and Dilaudid were stopped.  She was eventually placed on Depakote and p.r.n. Ultram for headache,  which worked well.  Her nausea resolved.  She has no focal neurologic  deficit.  It is somewhat questionable that she has a urinary tract  infection, though the most recent UA is unlikely.  Plans are for  discharge home on Saturday, February 11, 2008, in care of her family.  Her  mother lives near close by and plans to help her with her children.  She  has no PT, OT, or speech therapy needs.   CONDITION AT DISCHARGE:  The patient with headache and mild nausea, but  significantly decreased, ambulatory now without assistance.  She is  alert and oriented x3.  No aphasia.  No dysarthria.  No focal weakness.  She moves all 4 extremities well.   DISCHARGE PLAN:  1. Discharged home with family.  2. No aspirin or aspirin-containing products.  No ibuprofen.  3. Follow up with Dr. Delia Heady in 2-3 months.  4. Follow up with regular medical doctor within 1 month.      Annie Main, N.P.    ______________________________  Sunny Schlein. Pearlean Brownie, MD    SB/MEDQ  D:  02/10/2008  T:  02/11/2008  Job:  [id number]

## 2011-03-10 NOTE — Procedures (Signed)
EEG NUMBER:  [id number].   CLINICAL HISTORY:  This is a 40 year old woman admitted for altered  mental status and seizures with a history of a right parietal  hemorrhagic stroke suffered in April of this year.  EEG is performed for  evaluation.  The patient described as awake and drowsy.  This is a  portable EEG done at the bedside.   DESCRIPTION:  The dominant rhythms tracing is at moderate amplitude  alpha rhythm of 8-9 Hz, which predominates posteriorly, appears without  abnormal asymmetry, attenuates without opening and closing.  Low  amplitude fast activity seen frontally and centrally and appears without  abnormal asymmetry.  No focal slowing is noted and no epileptiform  discharges seen.  Drowsiness occurs naturally as evidenced by  fragmentation at the background generalized slowing of rhythms.  Stage  II sleep is not seen.  A single-channel devoted EKG revealed sinus  rhythm throughout with the rate of approximately 90 beats minute.   CONCLUSIONS:  Borderline study due to the presence of borderline or mild  slowing of background rhythms, findings suggestive of diffuse widespread  cerebral dysfunction, and consistent with a drowsy and/or mildly  encephalopathic state.  No focal slowing is noted and no epileptiform  discharges were seen.      Michael L. Thad Ranger, M.D.  Electronically Signed     BJY:NWGN  D:  03/21/2008 16:23:12  T:  03/22/2008 09:16:26  Job #:  [id number]

## 2011-03-10 NOTE — Discharge Summary (Signed)
NAMEANISHKA, [name]                ACCOUNT NO.:  [id number]   MEDICAL RECORD NO.:  [id number]          PATIENT TYPE:  INP   LOCATION:  5527                         FACILITY:  [facility]   PHYSICIAN:  Hannah I Elsaid, MD      DATE OF BIRTH:  05/05/1971   DATE OF ADMISSION:  03/20/2008  DATE OF DISCHARGE:  03/22/2008                               DISCHARGE SUMMARY   DISCHARGE DIAGNOSES:  1. Altered mental status multifactorial secondary to alcohol      intoxication and postictal state.  2. Seizure disorder with subtherapeutic valproic acid.  3. Alcohol intoxication.  4. Hypophosphatemia, resolved.  5. Normocytic anemia.  6. Recent right parietal intracerebral hemorrhage with      intraventricular extension.  7. History of migraine headache.  8. History of depression.   DISCHARGE MEDICATIONS:  1. Levaquin 500 mg 3 times daily.  2. Folic acid 1 mg daily.  3. Thiamine 100 mg daily.  4.valoproic 500 mg TID   CONSULTATION:  Neurology consult done by Dr. Lesia Sago.   PROCEDURES:  CT head, old right parietal infarct from previous  hemorrhage, no acute abnormalities.  CT cervical spine, mild scoliosis, minimal reversal of normal cervical  lordosis.  No fracture or subluxation.  EEG, borderline study due to the presence of borderline or mild slowing  of background rhythm suggestive of diffuse widespread cerebral  dysfunction and consistent with drowsy and mild encephalopathic state.  No focal slowing is noted and no epileptiform discharge.   HISTORY OF PRESENT ILLNESS:  This is a 40 year old African American  female admitted to the hospital by the EMS for altered mental status.  Family called EMS because of the patient's behavioral changes, earlier  yesterday she had a fall.  There were no reports of seizure activity.  CT head, which showed no acute intracranial abnormality.  In the  emergency room, the patient noted to have seizure-like activity, which  was tonic-clonic with head  shaking and fixed gaze, lids were normal at  that time and there was no arm or leg shaking.  No incontinence or  tongue biting.  She received Ativan and head and neck movement stopped.  The patient found to have alcohol level of 133 and the patient admitted  for evaluation.   1. Altered mental status, multifactorial secondary to alcohol      intoxication, and possible postictal state.  Accordingly, the      patient was started on IV fluids, thiamine, and folate IV.  The      patient was recently discharged from the hospital secondary to      intracranial hemorrhage and placed on valproate for seizure.  The      patient found to have subtherapeutic valproic acid, which is less      than 10.  The patient was kept on close monitoring and seizure      precautions, started on higher dose of valproic acid and Neurology      consulted where they recommend to stop tramadol as it increases the      risk of seizure and a seizure  has multifactorial including      medications mainly tramadol and alcohol intoxication, in addition      to very low valproic acid.  As we mentioned CT head was negative,      and accordingly, the patient was started on valproic acid 3 times      daily.  There is no further seizure activity noted.  Altered mental      status completely resolved, and at this time, the patient is alert      and oriented x3.  2. Alcohol intoxication.  A counsel was done during the hospital, the      patient denies any history of alcohol abuse, counseling was done,      and the patient admitted she did not need any further help with      alcohol.  Follow up of valproic acid remains therapeutic during      hospitalization, so the patient will be discharged on valproic acid      500 mg 3 times daily.  3. Hypophosphatemia and hyponatremia, status post replacement with      normalization of the level.  4. Prolong QT interval noted on the EKG, resulted secondary to      electrolyte imbalance.  A  repeat EKG showed normal sinus without      any abnormal QT intervals.  5. Normocytic anemia.  Hemoglobin remained stable during      hospitalization.  The patient need to follow with DR [facility]  as an      outpatient.   DISPOSITION:  The patient received also physical therapy and  occupational therapy during hospitalization.  They recommend home health  PT for increased activity.  Accordingly, the patient will be discharged  home with home PT.  The patient will follow up with Dr. Pearlean Brownie the Neurology within 2-3  weeks.      Hannah Bosie Helper, MD  Electronically Signed     HIE/MEDQ  D:  03/22/2008  T:  03/23/2008  Job:  [id number]

## 2011-03-10 NOTE — Assessment & Plan Note (Signed)
[facility]                            CARDIOLOGY OFFICE NOTE   [name], [name]                       MRN:          [id number]  DATE:11/20/2008                            DOB:          1971-02-12    [name] returns today for followup.  I initially saw her as a consult on  January 15.  She had been having atypical chest pain and dyspnea.   She has history of parietal hemorrhage and there were some issues  regarding compliance with her Depakote.  I reviewed her adenosine  Myoview study today.  Her baseline EKG was normal.  She did have  occasional ectopy with the infusion.  Her Myoview study was normal.  There was no evidence of ischemia or infarction.  The ejection fraction  was 70%.   I also reviewed her echocardiogram today.  She had trivial to mild  mitral insufficiency, but otherwise, no significant valvular heart  disease and normal LV function with an EF of 55-60%.   In talking to the patient, she continues to have some dyspnea.  She says  she smothers when she lays down at night.   Given her normal EKG, normal exam, normal echo, and normal Myoview.  I  do not think this represents PND or orthopnea.  I suggested that she  possibly followup with Dr. Maryellen Pile to have further pulmonary evaluation.   She continues to also have atypical muscular type chest pain.  It is in  the center of her chest.  It is not progressive.  He can occasionally be  positional.  There is radiation to the shoulders.   Nothing she does makes it better or worse.   There was a previous questionable history of syncope, which has not  recurred.  I think it was more likely related to a possible seizure  since she was not taking her Depakote.   REVIEW OF SYSTEMS:  Otherwise negative.   CURRENT MEDICATIONS:  1. Depakote 500 mg tabs two at bedtime.  2. Neurontin 100 b.i.d.  3. Excedrin.  4. Nortriptyline.  5. Diclofenac 75 b.i.d.   PHYSICAL EXAMINATION:  VITAL SIGNS:   Remarkable for healthy-appearing  black female in no distress.  VITAL SIGNS:  Blood pressure is 110/74, pulse is 88 and regular,  respiratory rate 14, afebrile.  HEENT:  Unremarkable.  NECK:  Carotids are normal without bruit.  No lymphadenopathy,  thyromegaly, or JVP elevation.  LUNGS:  Clear.  Good diaphragmatic motion.  No wheezing.  CARDIAC:  S1, S2 with distant heart sounds.  PMI normal.  ABDOMEN:  Benign.  Bowel sounds positive.  No AAA.  No tenderness.  No  bruit.  No hepatosplenomegaly or hepatojugular reflux.  No tenderness.  EXTREMITIES:  Distal pulses are intact.  No edema.  NEUROLOGIC:  Remarkable for a contracted and weak left upper extremity.  She also has some left lower extremity weakness.   EKG is normal as indicated adenosine Myoview and echocardiogram.   IMPRESSION:  1. Chest pain atypical, nonischemic Myoview.  Consider baby aspirin.  2. Dyspnea.  No obvious cardiac etiology trivial to  mild mitral      regurgitation by echo, but normal left ventricular function without      evidence of pulmonary hypertension.  Followup with Dr. Maryellen Pile,      consider pulmonary workup.  3. Parietal hemorrhage.  Followup with Dr. Pearlean Brownie.  Consider ongoing PT      and OT in regards to left-sided weakness.  4. The patient's smoking cessation.  Again, counseled the patient for      less than 10 minutes regarding smoking cessation.  I suspect this      has a lot to do with her vascular disease.  She would be a      reasonable candidate for Wellbutrin.  5. Question history of syncope versus seizure, non-recurrent.      Followup with Neurology.  No high risk factors regarding cardiac      arrhythmias with normal QT interval, normal left ventricular      function, and no ischemia on Myoview.   The patient will be seen on an as-needed basis and followup primarily  with Dr. Pearlean Brownie and Dr. Maryellen Pile.     Noralyn Pick. Eden Emms, MD, [facility]  Electronically Signed    PCN/MedQ  DD: 11/20/2008  DT:  11/21/2008  Job #: [id number]   cc:   Pramod P. Pearlean Brownie, MD  Toy Cookey, MD  Jacqulyn Liner, NP with Dr. Marlis Edelson office

## 2011-04-14 ENCOUNTER — Emergency Department: Payer: Self-pay | Admitting: Emergency Medicine

## 2011-06-23 ENCOUNTER — Emergency Department (HOSPITAL_COMMUNITY)
Admission: EM | Admit: 2011-06-23 | Discharge: 2011-06-23 | Disposition: A | Discharge status: Home / Self Care | Payer: Medicaid Other | Attending: Emergency Medicine | Admitting: Emergency Medicine

## 2011-06-23 ENCOUNTER — Emergency Department (HOSPITAL_COMMUNITY): Payer: Medicaid Other

## 2011-06-23 DIAGNOSIS — G43909 Migraine, unspecified, not intractable, without status migrainosus: Secondary | ICD-10-CM | POA: Insufficient documentation

## 2011-06-23 DIAGNOSIS — M542 Cervicalgia: Secondary | ICD-10-CM | POA: Insufficient documentation

## 2011-06-23 DIAGNOSIS — H53149 Visual discomfort, unspecified: Secondary | ICD-10-CM | POA: Insufficient documentation

## 2011-06-23 DIAGNOSIS — Z8673 Personal history of transient ischemic attack (TIA), and cerebral infarction without residual deficits: Secondary | ICD-10-CM | POA: Insufficient documentation

## 2011-06-23 DIAGNOSIS — R112 Nausea with vomiting, unspecified: Secondary | ICD-10-CM | POA: Insufficient documentation

## 2011-06-23 DIAGNOSIS — R209 Unspecified disturbances of skin sensation: Secondary | ICD-10-CM | POA: Insufficient documentation

## 2011-07-21 LAB — URINALYSIS, ROUTINE W REFLEX MICROSCOPIC
Bilirubin Urine: NEGATIVE
Glucose, UA: NEGATIVE
Glucose, UA: NEGATIVE
Ketones, ur: >80 — AB
Nitrite: NEGATIVE
Nitrite: NEGATIVE
Protein, ur: 30 — AB
Specific Gravity, Urine: 1.016
Specific Gravity, Urine: 1.006
Urobilinogen, UA: 0.2
Urobilinogen, UA: 1
pH: 6.5
pH: 7.5

## 2011-07-21 LAB — BASIC METABOLIC PANEL
BUN: 5 — ABNORMAL LOW
CO2: 23
CO2: 26
Calcium: 8.8
Calcium: 8.8
Chloride: 110
Creatinine, Ser: 0.52
Creatinine, Ser: 0.67
GFR calc Af Amer: >60
GFR calc Af Amer: >60
GFR calc non Af Amer: >60
Sodium: 139

## 2011-07-21 LAB — URINE MICROSCOPIC-ADD ON

## 2011-07-21 LAB — COMPREHENSIVE METABOLIC PANEL
ALT: 13
AST: 19
Albumin: 3.4 — ABNORMAL LOW
Alkaline Phosphatase: 47
CO2: 25
Chloride: 111
Creatinine, Ser: 0.65
GFR calc Af Amer: >60
GFR calc non Af Amer: >60
Potassium: 3.3 — ABNORMAL LOW
Sodium: 142
Total Bilirubin: 0.7

## 2011-07-21 LAB — ANA: Anti Nuclear Antibody(ANA): NEGATIVE

## 2011-07-21 LAB — BETA-2-GLYCOPROTEIN I ABS, IGG/M/A
Beta-2 Glyco I IgG: <4 U/mL (ref <20)
Beta-2-Glycoprotein I IgA: 7 U/mL (ref <10)

## 2011-07-21 LAB — LUPUS ANTICOAGULANT PANEL
DRVVT: 39.6 (ref 36.1–47.0)
Lupus Anticoagulant: NOT DETECTED

## 2011-07-21 LAB — URINE CULTURE

## 2011-07-21 LAB — CBC
HCT: 33.9 — ABNORMAL LOW
Hemoglobin: 11.2 — ABNORMAL LOW
Hemoglobin: 11.1 — ABNORMAL LOW
MCHC: 32.9
MCV: 82.9
Platelets: 266
Platelets: 279
RBC: 4.05
RBC: 4.03
RDW: 13.7
WBC: 15.8 — ABNORMAL HIGH
WBC: 15.5 — ABNORMAL HIGH

## 2011-07-21 LAB — C4 COMPLEMENT: Complement C4, Body Fluid: 23

## 2011-07-21 LAB — APTT
aPTT: 23 — ABNORMAL LOW
aPTT: 26

## 2011-07-21 LAB — ANTITHROMBIN III: AntiThromb III Func: 118 (ref 76–126)

## 2011-07-21 LAB — CARDIOLIPIN ANTIBODIES, IGG, IGM, IGA
Anticardiolipin IgG: <7 — ABNORMAL LOW (ref <11)
Anticardiolipin IgM: 7 — ABNORMAL LOW (ref <10)

## 2011-07-21 LAB — PROTHROMBIN GENE MUTATION

## 2011-07-21 LAB — PROTIME-INR: INR: 1.1

## 2011-07-21 LAB — SICKLE CELL SCREEN: Sickle Cell Screen: NEGATIVE

## 2011-07-21 LAB — PROTEIN S ACTIVITY: Protein S Activity: 85 % (ref 69–129)

## 2011-07-22 LAB — POCT PREGNANCY, URINE: Operator id: 277751

## 2011-07-22 LAB — URINE MICROSCOPIC-ADD ON

## 2011-07-22 LAB — TRICYCLIC ANTIDEPRESSANT EVALUATION
Amitrip+Nortrip: <5 mcg/L — ABNORMAL LOW (ref 120–250)
Desemethylclomipramine: <5
Doxepin: <5 mcg/L
Imipram+Desipr Total: <5 mcg/L — ABNORMAL LOW (ref 150–300)
Imipramine Lvl: <5 mcg/L
Nortriptyline Lvl: <5 mcg/L
Tot Clomipramine+Desmethylclomipramine: NOT DETECTED not reported

## 2011-07-22 LAB — COMPREHENSIVE METABOLIC PANEL
AST: 20
Albumin: 3.5
Alkaline Phosphatase: 41
Alkaline Phosphatase: 44
BUN: 4 — ABNORMAL LOW
CO2: 21
Calcium: 8.1 — ABNORMAL LOW
Chloride: 114 — ABNORMAL HIGH
Creatinine, Ser: 0.56
Creatinine, Ser: 0.57
GFR calc Af Amer: >60
GFR calc non Af Amer: >60
Glucose, Bld: 91
Potassium: 3.2 — ABNORMAL LOW
Potassium: 3.6
Total Bilirubin: 0.7
Total Protein: 5.4 — ABNORMAL LOW

## 2011-07-22 LAB — URINALYSIS, ROUTINE W REFLEX MICROSCOPIC
Glucose, UA: NEGATIVE
Leukocytes, UA: NEGATIVE
Protein, ur: NEGATIVE
Specific Gravity, Urine: 1.008

## 2011-07-22 LAB — RAPID URINE DRUG SCREEN, HOSP PERFORMED
Amphetamines: NOT DETECTED
Barbiturates: NOT DETECTED
Benzodiazepines: NOT DETECTED

## 2011-07-22 LAB — CBC
HCT: 31.4 — ABNORMAL LOW
HCT: 34.9 — ABNORMAL LOW
MCHC: 33.8
MCV: 83.6
MCV: 83.9
Platelets: 223
Platelets: 225
RBC: 4.17
RDW: 14.6
RDW: 14.8
WBC: 11.3 — ABNORMAL HIGH

## 2011-07-22 LAB — BASIC METABOLIC PANEL
BUN: 2 — ABNORMAL LOW
CO2: 26
Chloride: 111
Creatinine, Ser: 0.6

## 2011-07-22 LAB — CK: Total CK: 64

## 2011-07-22 LAB — LIPASE, BLOOD: Lipase: 18

## 2011-07-22 LAB — ETHANOL: Alcohol, Ethyl (B): 133 — ABNORMAL HIGH

## 2011-07-22 LAB — DIFFERENTIAL
Basophils Absolute: 0
Basophils Relative: 0
Eosinophils Absolute: 0
Eosinophils Relative: 0
Monocytes Absolute: 0.7

## 2011-07-22 LAB — PHOSPHORUS: Phosphorus: 2.4

## 2011-07-22 LAB — SALICYLATE LEVEL: Salicylate Lvl: <4

## 2011-07-22 LAB — MAGNESIUM: Magnesium: 2.1

## 2011-09-22 ENCOUNTER — Emergency Department: Payer: Self-pay | Admitting: Emergency Medicine

## 2011-12-19 ENCOUNTER — Ambulatory Visit: Payer: Self-pay | Admitting: Neurology

## 2011-12-19 ENCOUNTER — Observation Stay: Payer: Self-pay | Admitting: Internal Medicine

## 2011-12-19 LAB — URINALYSIS, COMPLETE
Bacteria: NONE SEEN
Bilirubin,UR: NEGATIVE
Glucose,UR: NEGATIVE mg/dL (ref 0–75)
Ph: 8 (ref 4.5–8.0)
Protein: NEGATIVE
RBC,UR: 32 /HPF (ref 0–5)
Specific Gravity: 1.004 (ref 1.003–1.030)
Squamous Epithelial: 6
WBC UR: 62 /HPF (ref 0–5)

## 2011-12-19 LAB — BASIC METABOLIC PANEL
Anion Gap: 15 (ref 7–16)
BUN: 7 mg/dL (ref 7–18)
Chloride: 107 mmol/L (ref 98–107)
Co2: 23 mmol/L (ref 21–32)
Creatinine: 0.53 mg/dL — ABNORMAL LOW (ref 0.60–1.30)
EGFR (African American): >60
Osmolality: 286 (ref 275–301)
Potassium: 3.2 mmol/L — ABNORMAL LOW (ref 3.5–5.1)

## 2011-12-19 LAB — SEDIMENTATION RATE: Erythrocyte Sed Rate: 11 mm/hr (ref 0–20)

## 2011-12-19 LAB — CBC
HCT: 38.7 % (ref 35.0–47.0)
HGB: 12.6 g/dL (ref 12.0–16.0)
MCH: 27.2 pg (ref 26.0–34.0)
MCHC: 32.7 g/dL (ref 32.0–36.0)
Platelet: 310 10*3/uL (ref 150–440)
RBC: 4.64 10*6/uL (ref 3.80–5.20)

## 2011-12-20 LAB — CBC WITH DIFFERENTIAL/PLATELET
Basophil #: 0 10*3/uL (ref 0.0–0.1)
Basophil %: 0.4 %
Eosinophil %: 1.5 %
HCT: 33.4 % — ABNORMAL LOW (ref 35.0–47.0)
Lymphocyte #: 3.6 10*3/uL (ref 1.0–3.6)
Lymphocyte %: 30.2 %
MCHC: 33.3 g/dL (ref 32.0–36.0)
Monocyte #: 1 10*3/uL — ABNORMAL HIGH (ref 0.0–0.7)
Monocyte %: 8 %
Neutrophil %: 59.9 %
Platelet: 275 10*3/uL (ref 150–440)
RBC: 4.02 10*6/uL (ref 3.80–5.20)
WBC: 12 10*3/uL — ABNORMAL HIGH (ref 3.6–11.0)

## 2011-12-20 LAB — BASIC METABOLIC PANEL
Anion Gap: 9 (ref 7–16)
BUN: 10 mg/dL (ref 7–18)
Chloride: 109 mmol/L — ABNORMAL HIGH (ref 98–107)
Co2: 24 mmol/L (ref 21–32)
Creatinine: 0.7 mg/dL (ref 0.60–1.30)
EGFR (African American): >60
Osmolality: 282 (ref 275–301)

## 2012-04-27 ENCOUNTER — Emergency Department: Payer: Self-pay | Admitting: *Deleted

## 2012-04-27 LAB — CBC
HCT: 39.9 % (ref 35.0–47.0)
HGB: 12.3 g/dL (ref 12.0–16.0)
MCHC: 31 g/dL — ABNORMAL LOW (ref 32.0–36.0)
Platelet: 300 10*3/uL (ref 150–440)
RBC: 4.85 10*6/uL (ref 3.80–5.20)

## 2012-04-27 LAB — COMPREHENSIVE METABOLIC PANEL
Alkaline Phosphatase: 92 U/L (ref 50–136)
Bilirubin,Total: 0.3 mg/dL (ref 0.2–1.0)
Co2: 27 mmol/L (ref 21–32)
Creatinine: 0.68 mg/dL (ref 0.60–1.30)
EGFR (African American): >60
EGFR (Non-African Amer.): >60
Glucose: 90 mg/dL (ref 65–99)
SGOT(AST): 26 U/L (ref 15–37)
SGPT (ALT): 30 U/L
Sodium: 142 mmol/L (ref 136–145)
Total Protein: 8.1 g/dL (ref 6.4–8.2)

## 2012-04-27 LAB — TROPONIN I: Troponin-I: <0.02 ng/mL

## 2013-04-07 ENCOUNTER — Observation Stay: Payer: Self-pay | Admitting: Internal Medicine

## 2013-04-07 DIAGNOSIS — I6789 Other cerebrovascular disease: Secondary | ICD-10-CM

## 2013-04-07 LAB — SALICYLATE LEVEL: Salicylates, Serum: 2.9 mg/dL — ABNORMAL HIGH

## 2013-04-07 LAB — CBC
HCT: 39.4 % (ref 35.0–47.0)
HGB: 12.9 g/dL (ref 12.0–16.0)
MCH: 26.7 pg (ref 26.0–34.0)
MCHC: 32.7 g/dL (ref 32.0–36.0)
Platelet: 301 10*3/uL (ref 150–440)
RDW: 14.5 % (ref 11.5–14.5)
WBC: 16.7 10*3/uL — ABNORMAL HIGH (ref 3.6–11.0)

## 2013-04-07 LAB — BASIC METABOLIC PANEL
Anion Gap: 8 (ref 7–16)
EGFR (Non-African Amer.): >60
Glucose: 103 mg/dL — ABNORMAL HIGH (ref 65–99)
Osmolality: 282 (ref 275–301)
Potassium: 3.9 mmol/L (ref 3.5–5.1)

## 2014-02-01 ENCOUNTER — Emergency Department: Payer: Self-pay | Admitting: Emergency Medicine

## 2014-02-01 LAB — PROTIME-INR
INR: 1
Prothrombin Time: 13.2 secs (ref 11.5–14.7)

## 2014-02-01 LAB — COMPREHENSIVE METABOLIC PANEL
ALK PHOS: 74 U/L
ALT: 30 U/L (ref 12–78)
ANION GAP: 6 — AB (ref 7–16)
AST: 20 U/L (ref 15–37)
Albumin: 4 g/dL (ref 3.4–5.0)
BUN: 6 mg/dL — ABNORMAL LOW (ref 7–18)
Bilirubin,Total: 0.4 mg/dL (ref 0.2–1.0)
Calcium, Total: 9.4 mg/dL (ref 8.5–10.1)
Chloride: 106 mmol/L (ref 98–107)
Co2: 28 mmol/L (ref 21–32)
Creatinine: 0.69 mg/dL (ref 0.60–1.30)
EGFR (African American): >60
EGFR (Non-African Amer.): >60
GLUCOSE: 88 mg/dL (ref 65–99)
OSMOLALITY: 276 (ref 275–301)
POTASSIUM: 3.3 mmol/L — AB (ref 3.5–5.1)
SODIUM: 140 mmol/L (ref 136–145)
Total Protein: 7.9 g/dL (ref 6.4–8.2)

## 2014-02-01 LAB — CBC
HCT: 39.3 % (ref 35.0–47.0)
HGB: 12.5 g/dL (ref 12.0–16.0)
MCH: 26.3 pg (ref 26.0–34.0)
MCHC: 31.7 g/dL — ABNORMAL LOW (ref 32.0–36.0)
MCV: 83 fL (ref 80–100)
PLATELETS: 290 10*3/uL (ref 150–440)
RBC: 4.75 10*6/uL (ref 3.80–5.20)
RDW: 13.9 % (ref 11.5–14.5)
WBC: 16.1 10*3/uL — ABNORMAL HIGH (ref 3.6–11.0)

## 2014-08-29 ENCOUNTER — Emergency Department: Payer: Self-pay | Admitting: Emergency Medicine

## 2014-08-29 LAB — URINALYSIS, COMPLETE
Bilirubin,UR: NEGATIVE
Glucose,UR: NEGATIVE mg/dL (ref 0–75)
Ketone: NEGATIVE
LEUKOCYTE ESTERASE: NEGATIVE
Nitrite: NEGATIVE
Ph: 7 (ref 4.5–8.0)
Protein: NEGATIVE
RBC,UR: 66 /HPF (ref 0–5)
SPECIFIC GRAVITY: 1.012 (ref 1.003–1.030)
Squamous Epithelial: 7

## 2014-08-29 LAB — BASIC METABOLIC PANEL
ANION GAP: 8 (ref 7–16)
BUN: 9 mg/dL (ref 7–18)
CHLORIDE: 113 mmol/L — AB (ref 98–107)
CREATININE: 0.74 mg/dL (ref 0.60–1.30)
Calcium, Total: 8.5 mg/dL (ref 8.5–10.1)
Co2: 24 mmol/L (ref 21–32)
Glucose: 87 mg/dL (ref 65–99)
Osmolality: 287 (ref 275–301)
POTASSIUM: 4 mmol/L (ref 3.5–5.1)
Sodium: 145 mmol/L (ref 136–145)

## 2014-08-29 LAB — CBC
HCT: 40.2 % (ref 35.0–47.0)
HGB: 12.7 g/dL (ref 12.0–16.0)
MCH: 26.1 pg (ref 26.0–34.0)
MCHC: 31.7 g/dL — ABNORMAL LOW (ref 32.0–36.0)
MCV: 83 fL (ref 80–100)
Platelet: 396 10*3/uL (ref 150–440)
RBC: 4.87 10*6/uL (ref 3.80–5.20)
RDW: 13.9 % (ref 11.5–14.5)
WBC: 15.3 10*3/uL — AB (ref 3.6–11.0)

## 2014-08-29 LAB — TROPONIN I: Troponin-I: <0.02 ng/mL

## 2014-08-31 ENCOUNTER — Other Ambulatory Visit: Payer: Self-pay

## 2014-08-31 ENCOUNTER — Emergency Department (HOSPITAL_COMMUNITY): Payer: Managed Care, Other (non HMO)

## 2014-08-31 ENCOUNTER — Encounter (HOSPITAL_COMMUNITY): Payer: Self-pay | Admitting: Emergency Medicine

## 2014-08-31 ENCOUNTER — Emergency Department (HOSPITAL_COMMUNITY)
Admission: EM | Admit: 2014-08-31 | Discharge: 2014-08-31 | Disposition: A | Discharge status: Home / Self Care | Payer: Managed Care, Other (non HMO) | Attending: Emergency Medicine | Admitting: Emergency Medicine

## 2014-08-31 DIAGNOSIS — R55 Syncope and collapse: Secondary | ICD-10-CM | POA: Diagnosis present

## 2014-08-31 DIAGNOSIS — R200 Anesthesia of skin: Secondary | ICD-10-CM | POA: Insufficient documentation

## 2014-08-31 DIAGNOSIS — R630 Anorexia: Secondary | ICD-10-CM | POA: Insufficient documentation

## 2014-08-31 DIAGNOSIS — R51 Headache: Secondary | ICD-10-CM | POA: Diagnosis not present

## 2014-08-31 DIAGNOSIS — H53149 Visual discomfort, unspecified: Secondary | ICD-10-CM | POA: Insufficient documentation

## 2014-08-31 DIAGNOSIS — Z880 Allergy status to penicillin: Secondary | ICD-10-CM | POA: Diagnosis not present

## 2014-08-31 DIAGNOSIS — R569 Unspecified convulsions: Secondary | ICD-10-CM | POA: Insufficient documentation

## 2014-08-31 DIAGNOSIS — Z8669 Personal history of other diseases of the nervous system and sense organs: Secondary | ICD-10-CM | POA: Insufficient documentation

## 2014-08-31 DIAGNOSIS — Z8679 Personal history of other diseases of the circulatory system: Secondary | ICD-10-CM | POA: Insufficient documentation

## 2014-08-31 DIAGNOSIS — Z720 Tobacco use: Secondary | ICD-10-CM | POA: Diagnosis not present

## 2014-08-31 DIAGNOSIS — R42 Dizziness and giddiness: Secondary | ICD-10-CM | POA: Insufficient documentation

## 2014-08-31 DIAGNOSIS — Z3202 Encounter for pregnancy test, result negative: Secondary | ICD-10-CM | POA: Insufficient documentation

## 2014-08-31 DIAGNOSIS — R531 Weakness: Secondary | ICD-10-CM | POA: Diagnosis not present

## 2014-08-31 DIAGNOSIS — R519 Headache, unspecified: Secondary | ICD-10-CM

## 2014-08-31 HISTORY — DX: Unspecified convulsions: R56.9

## 2014-08-31 LAB — CBC WITH DIFFERENTIAL/PLATELET
Basophils Absolute: 0 10*3/uL (ref 0.0–0.1)
Basophils Relative: 0 % (ref 0–1)
EOS PCT: 1 % (ref 0–5)
Eosinophils Absolute: 0.2 10*3/uL (ref 0.0–0.7)
HEMATOCRIT: 38.3 % (ref 36.0–46.0)
Hemoglobin: 12.6 g/dL (ref 12.0–15.0)
LYMPHS ABS: 3.5 10*3/uL (ref 0.7–4.0)
Lymphocytes Relative: 26 % (ref 12–46)
MCH: 26.6 pg (ref 26.0–34.0)
MCHC: 32.9 g/dL (ref 30.0–36.0)
MCV: 80.8 fL (ref 78.0–100.0)
Monocytes Absolute: 0.8 10*3/uL (ref 0.1–1.0)
Monocytes Relative: 6 % (ref 3–12)
Neutro Abs: 8.8 10*3/uL — ABNORMAL HIGH (ref 1.7–7.7)
Neutrophils Relative %: 67 % (ref 43–77)
PLATELETS: 368 10*3/uL (ref 150–400)
RBC: 4.74 MIL/uL (ref 3.87–5.11)
RDW: 13.8 % (ref 11.5–15.5)
WBC: 13.3 10*3/uL — AB (ref 4.0–10.5)

## 2014-08-31 LAB — BASIC METABOLIC PANEL
Anion gap: 14 (ref 5–15)
BUN: 9 mg/dL (ref 6–23)
CO2: 25 meq/L (ref 19–32)
Calcium: 9.7 mg/dL (ref 8.4–10.5)
Chloride: 103 mEq/L (ref 96–112)
Creatinine, Ser: 0.68 mg/dL (ref 0.50–1.10)
GFR calc Af Amer: >90 mL/min (ref >90)
GFR calc non Af Amer: >90 mL/min (ref >90)
Glucose, Bld: 96 mg/dL (ref 70–99)
POTASSIUM: 3.6 meq/L — AB (ref 3.7–5.3)
SODIUM: 142 meq/L (ref 137–147)

## 2014-08-31 LAB — POC URINE PREG, ED: Preg Test, Ur: NEGATIVE

## 2014-08-31 MED ORDER — IOHEXOL 350 MG/ML SOLN
50.0000 mL | Freq: Once | INTRAVENOUS | Status: AC | PRN
  Administered 2014-08-31: 50 mL via INTRAVENOUS

## 2014-08-31 MED ORDER — DIPHENHYDRAMINE HCL 50 MG/ML IJ SOLN
50.0000 mg | Freq: Once | INTRAMUSCULAR | Status: AC
  Administered 2014-08-31: 50 mg via INTRAVENOUS
  Filled 2014-08-31: qty 1

## 2014-08-31 MED ORDER — METOCLOPRAMIDE HCL 5 MG/ML IJ SOLN
10.0000 mg | Freq: Once | INTRAMUSCULAR | Status: AC
  Administered 2014-08-31: 10 mg via INTRAVENOUS
  Filled 2014-08-31: qty 2

## 2014-08-31 MED ORDER — METOCLOPRAMIDE HCL 10 MG PO TABS: 10.0000 mg | ORAL_TABLET | Freq: Three times a day (TID) | ORAL | Status: DC | PRN

## 2014-08-31 MED ORDER — FENTANYL CITRATE 0.05 MG/ML IJ SOLN: 50.0000 ug | INTRAMUSCULAR | Status: DC | PRN

## 2014-08-31 MED ORDER — SODIUM CHLORIDE 0.9 % IV BOLUS (SEPSIS)
500.0000 mL | Freq: Once | INTRAVENOUS | Status: AC
  Administered 2014-08-31: 500 mL via INTRAVENOUS

## 2014-08-31 NOTE — ED Notes (Signed)
Patient states has been having syncopal episodes x 3 days.  Patient states has not had one today, but it still having severe headache, L eye blurry vision, and tingling in L arm.   Patient states she went to [facility] yesterday and they sent her home after CT and EKG.   Patient states she is scared because she had aneurysm previously and needs MRI.

## 2014-08-31 NOTE — ED Provider Notes (Addendum)
CSN: [id number]     Arrival date & time 08/31/14  1152 History   First MD Initiated Contact with Patient 08/31/14 1204     Chief Complaint  Patient presents with  . Loss of Consciousness     (Consider location/radiation/quality/duration/timing/severity/associated sxs/prior Treatment) HPI Comments: 43 year old female presents for worsening headache, syncope and left facial numbness.  Patient has a history of bleeding aneurysm, denies coiling procedure. 2 days prior patient had an episode where she had left sided headache, left blurry vision, left facial numbness and then lightheadedness with syncope, I was followed by seizure activity witnessed by family generalized. Patient has intermittent seizures since her aneurysm. Patient feels that headache is similar twitches had before in 2 days ago it started mild gradual onset and has gradually worsened since. Patient has mild photophobia with it. No fevers or vomiting. Patient was instructed follow neurology however neurology unable to get her in soon per their report.  Patient is a 43 y.o. female presenting with syncope. The history is provided by the patient.  Loss of Consciousness Associated symptoms: headaches and seizures   Associated symptoms: no chest pain, no fever, no shortness of breath and no vomiting     Past Medical History  Diagnosis Date  . Seizures   . Aneurysm    Past Surgical History  Procedure Laterality Date  . Abdominal hysterectomy     No family history on file. History  Substance Use Topics  . Smoking status: Current Every Day Smoker    Types: Cigarettes  . Smokeless tobacco: Not on file  . Alcohol Use: No   OB History    No data available     Review of Systems  Constitutional: Positive for appetite change. Negative for fever and chills.  HENT: Negative for congestion.   Eyes: Positive for photophobia and visual disturbance.  Respiratory: Negative for shortness of breath.   Cardiovascular: Positive for  syncope. Negative for chest pain.  Gastrointestinal: Negative for vomiting and abdominal pain.  Genitourinary: Negative for dysuria and flank pain.  Musculoskeletal: Negative for back pain, neck pain and neck stiffness.  Skin: Negative for rash.  Neurological: Positive for seizures, syncope, light-headedness, numbness (left face and left arm) and headaches.      Allergies  Amoxicillin; Morphine and related; Penicillins; and Shellfish allergy  Home Medications   Prior to Admission medications   Medication Sig Start Date End Date Taking? Authorizing Provider  ibuprofen (ADVIL,MOTRIN) 200 MG tablet Take 400 mg by mouth every 6 (six) hours as needed for mild pain or moderate pain.   Yes Historical Provider, MD  metoCLOPramide (REGLAN) 10 MG tablet Take 1 tablet (10 mg total) by mouth every 8 (eight) hours as needed for nausea. 08/31/14   Enid SkeensJoshua M Amiri Riechers, MD   BP 113/68 mmHg  Pulse 74  Temp(Src) 98.1 F (36.7 C) (Oral)  Resp 20  Ht 5' 4.5" (1.638 m)  Wt 173 lb (78.472 kg)  BMI 29.25 kg/m2  SpO2 100% Physical Exam  Constitutional: She is oriented to person, place, and time. She appears well-developed and well-nourished.  HENT:  Head: Normocephalic and atraumatic.  Eyes: Conjunctivae are normal. Right eye exhibits no discharge. Left eye exhibits no discharge.  Neck: Normal range of motion. Neck supple. No tracheal deviation present.  Cardiovascular: Normal rate and regular rhythm.   Pulmonary/Chest: Effort normal and breath sounds normal.  Abdominal: Soft. She exhibits no distension. There is no tenderness. There is no guarding.  Musculoskeletal: She exhibits no edema.  Neurological:  She is alert and oriented to person, place, and time. GCS eye subscore is 4. GCS verbal subscore is 5. GCS motor subscore is 6.  5+ strength in UE and LE with f/e at major joints except mild weakness LLE vs right. Sensation to palpation intact in UE and LE. CNs 2-12 grossly intact.  EOMFI.  PERRL.    Finger nose and coordination intact bilateral.   Visual fields intact to finger testing.   Skin: Skin is warm. No rash noted.  Psychiatric: She has a normal mood and affect.  Nursing note and vitals reviewed.   ED Course  Procedures (including critical care time) Labs Review Labs Reviewed  BASIC METABOLIC PANEL - Abnormal; Notable for the following:    Potassium 3.6 (*)    All other components within normal limits  CBC WITH DIFFERENTIAL - Abnormal; Notable for the following:    WBC 13.3 (*)    Neutro Abs 8.8 (*)    All other components within normal limits  POC URINE PREG, ED    Imaging Review Ct Angio Head W/cm &/or Wo Cm  08/31/2014   CLINICAL DATA:  Acute stabbing head pain. Acute intractable headache. Patient states cerebral angiogram in 2009. Imaging performed in 2009 does not demonstrate an aneurysm. The patient did have a parenchymal hemorrhage in the right parietal lobe at that time.  EXAM: CT ANGIOGRAPHY HEAD  TECHNIQUE: Multidetector CT imaging of the head was performed using the standard protocol during bolus administration of intravenous contrast. Multiplanar CT image reconstructions and MIPs were obtained to evaluate the vascular anatomy.  CONTRAST:  50mL OMNIPAQUE IOHEXOL 350 MG/ML SOLN  COMPARISON:  CT head without contrast 06/23/2011. Cerebral arteriogram 02/07/2008.  FINDINGS: Remote right parietal encephalomalacia associated with the prior hemorrhage is again seen. No acute hemorrhage or mass lesion is present. The ventricles are of normal size. No significant extra-axial fluid collection is evident.  The paranasal sinuses and mastoid air cells are clear.  The Internal carotid arteries are within normal limits from the high cervical segments through the ICA termini bilaterally. The A1 and M1 segments are normal. The anterior communicating artery is patent. MCA bifurcations are within normal limits. The ACA and MCA branch vessels are normal.  The vertebral arteries are  codominant. The right PICA origin is visualized and normal. The left AICA is dominant. Both posterior cerebral arteries originate from the basilar tip. The PCA branch vessels are normal.  Postcontrast images demonstrate no pathologic enhancement. The dural sinuses are patent.  Review of the MIP images confirms the above findings.  IMPRESSION: 1. No significant proximal stenosis, aneurysm, or branch vessel occlusion. 2. Encephalomalacia in the right parietal lobe corresponding to the area of previous hemorrhage. 3. No acute infarct, hemorrhage, or mass lesion.   Electronically Signed   By: Gennette Pachris  Mattern M.D.   On: 08/31/2014 15:13     EKG Interpretation None      MDM   Final diagnoses:  Headache   Patient with worsening headache and left-sided numbness since syncope/seizure activity2 days prior. Family in the room and witnessed generalize seizure lasting approximately 3-5 minutes and she was very tired afterwards. Patient doesn't have any heart history. No current chest pain or shortness of breath.  Plan for headache cocktail, IV fluids, blood work and CT angio for further detail with her headache history. No seizures in ED.   Patient improved in the ER. CT angiogram of the head no acute findings. Follow-up with general neurology.  Results and differential diagnosis  were discussed with the patient/parent/guardian. Close follow up outpatient was discussed, comfortable with the plan.   Medications  fentaNYL (SUBLIMAZE) injection 50 mcg (not administered)  metoCLOPramide (REGLAN) injection 10 mg (10 mg Intravenous Given 08/31/14 1310)  diphenhydrAMINE (BENADRYL) injection 50 mg (50 mg Intravenous Given 08/31/14 1310)  sodium chloride 0.9 % bolus 500 mL (0 mLs Intravenous Stopped 08/31/14 1548)  iohexol (OMNIPAQUE) 350 MG/ML injection 50 mL (50 mLs Intravenous Contrast Given 08/31/14 1453)    Filed Vitals:   08/31/14 1500 08/31/14 1515 08/31/14 1530 08/31/14 1545  BP: 118/76 110/59 114/71  113/68  Pulse: 72 74 71 74  Temp:      TempSrc:      Resp: 14 15 16 20   Height:      Weight:      SpO2: 100% 100% 100% 100%    Final diagnoses:  Headache       Enid Skeens, MD 08/31/14 1550  Enid Skeens, MD 08/31/14 1551

## 2014-08-31 NOTE — ED Notes (Signed)
Pt placed into gown and on monitor upon arrival to room. Pt monitored by blood pressure, pulse ox, and 5 lead. Brett CanalesSteve, RN and Dr. Jodi MourningZavitz at bedside. Family remains at bedside.

## 2014-08-31 NOTE — ED Notes (Signed)
Patient transported to CT 

## 2014-08-31 NOTE — Discharge Instructions (Signed)
If you were given medicines take as directed.  If you are on coumadin or contraceptives realize their levels and effectiveness is altered by many different medicines.  If you have any reaction (rash, tongues swelling, other) to the medicines stop taking and see a physician.   Take reglan with benadryl for headaches and nausea.  Please follow up as directed and return to the ER or see a physician for new or worsening symptoms.  Thank you. Filed Vitals:   08/31/14 1158 08/31/14 1335  BP: 125/65 133/81  Pulse: 80 74  Temp: 98.1 F (36.7 C)   TempSrc: Oral   Resp: 22 23  Height: 5' 4.5" (1.638 m)   Weight: 173 lb (78.472 kg)   SpO2: 98% 100%

## 2014-09-11 DIAGNOSIS — R253 Fasciculation: Secondary | ICD-10-CM | POA: Insufficient documentation

## 2014-09-11 DIAGNOSIS — R200 Anesthesia of skin: Secondary | ICD-10-CM | POA: Insufficient documentation

## 2015-01-22 ENCOUNTER — Ambulatory Visit: Payer: Self-pay | Admitting: Neurology

## 2015-02-15 NOTE — Discharge Summary (Signed)
PATIENT NAME:  [name], [name] MR#:  [id number] DATE OF BIRTH:  1971/08/01  DATE OF ADMISSION:  04/07/2013 DATE OF DISCHARGE:  04/07/2013  PRIMARY CARE PHYSICIAN:  Dr. Maryellen PileEason.  DISCHARGE DIAGNOSES:  Chronic left lower extremity pain and weakness.   HISTORY OF PRESENT ILLNESS:  The patient is a 44 year old female patient with history of arthritis, chronic left lower extremity pain, weakness, presented to the hospital complaining of some slurred speech and left lower extremity weakness.  The patient was thought to be having a stroke by the ER physician and was admitted to the hospitalist service.  The patient mentions that she does get stuttering when she has anxiety.  Her speech was stuttering and not slurring.  The patient did have lower extremity pain on neurological exam.  Minimal weakness which she says is unchanged.  Had MRI of the brain which showed no stroke.  Carotid Doppler showed good ejection fraction without any source of embolus.  Carotid Dopplers showed no significant stenosis.  Echo was fine.  The patient was started on aspirin, statin for prevention of any future strokes.  This plan was discussed with the patient, set up for outpatient physical therapy and is being discharged home in a fair condition.  I have also discussed with patient'Collins husband at bedside.  The patient will follow up with her primary care physician, Dr. Maryellen PileEason in a week and Dr. Sherryll BurgerShah of neurology in 2 to 3 weeks.   DISCHARGE MEDICATIONS: 1.  Aspirin 81 mg oral once a day.  2.  Zocor 20 mg oral once a day.   DISCHARGE INSTRUCTIONS:  Low-fat diet.  Activity as tolerated.  Get physical therapy with outpatient services as scheduled.   Time spent today on this discharge activity was 40 minutes.    ____________________________ Molinda BailiffSrikar R. Teesha Ohm, MD srs:ea D: 04/07/2013 16:15:00 ET T: 04/08/2013 05:10:23 ET JOB#: [id number]  cc: Serita ShellerErnest B. Maryellen PileEason, MD Dr. Fatima SangerShah Connor Foxworthy R. Shera Laubach, MD, <Dictator>   Orie FishermanSRIKAR R Temperance Kelemen  MD ELECTRONICALLY SIGNED 04/18/2013 10:52

## 2015-02-15 NOTE — H&P (Signed)
PATIENT NAME:  [name], [name] MR#:  [id number] DATE OF BIRTH:  1971-07-19  DATE OF ADMISSION:  04/07/2013  PRIMARY CARE PHYSICIAN: Alden ServerErnest B. Maryellen PileEason, MD  REFERRING PHYSICIAN: Su Leyobert L. Kinner, MD  CHIEF COMPLAINT: Slurred speech and left lower extremity weakness.   HISTORY OF PRESENT ILLNESS: The patient is a 44 year old female with a past medical history of pseudoseizures, questionable history of cerebral aneurysm, chronic headaches, COPD and recent history of left-sided body pain, which could be from conversion disorder, and GERD, who was brought into the ER with a chief complaint of slurred speech. According to the patient'Collins 2 daughters at bedside, the patient started having slurred speech at around 2:00 a.m. associated with left lower extremity weakness. The patient'Collins CAT scan of the head is negative for any acute findings. She was given aspirin, and hospitalist team was called to admit the patient. During my examination, more than difficulty in speech, the patient is stuttering her words with repetitions. Denies any headache or blurry vision, but complaining of left-sided lower extremity weakness. Denies any dizziness or loss of consciousness. No chest pain or shortness of breath.   PAST MEDICAL HISTORY:  1. Questionable history of cerebral aneurysm. 2. Pseudoseizures.  3. Chronic headache. 4. COPD. 5. GERD.   PAST SURGICAL HISTORY: Hysterectomy.   ALLERGIES: SHE IS ALLERGIC TO MORPHINE, PENICILLIN, ROCEPHIN, TYLENOL AND SHELLFISH.   HOME MEDICATIONS: None.   PSYCHOSOCIAL HISTORY: Lives at home with 2 daughters. Smokes 1 pack a day. Denies alcohol or illicit drug usage.   FAMILY HISTORY: Positive for diabetes mellitus, hypertension and stroke.   REVIEW OF SYSTEMS:  CONSTITUTIONAL: Denies any fever, fatigue, weakness, weight loss or weight gain.  EYES: Denies any blurry vision, glaucoma or cataracts.  ENT: Denies any epistaxis, discharge, snoring, postnasal drip, difficulty in  swallowing.  RESPIRATION: Has history of COPD, but denies any cough, wheezing.  CARDIOVASCULAR: No chest pain, palpitations or syncope.  GASTROINTESTINAL: No nausea, vomiting, diarrhea. Has history of GERD.  GENITOURINARY: Denies any dysuria, hematuria.  GYNECOLOGIC AND BREASTS: Denies any breast mass or vaginal discharge.  ENDOCRINE: Denies any polyuria, nocturia or thyroid problems. HEMATOLOGIC AND LYMPHATIC: No anemia, easy bruising or bleeding.  INTEGUMENTARY: No acne, rash or lesions.  MUSCULOSKELETAL: No joint pain in the neck, back. Denies any gout.  NEUROLOGIC: Past medical history of pseudoseizures. Denies any numbness or weakness. Has dysarthria. No vertigo or ataxia.  PSYCHIATRIC: Denies any ADD, OCD, bipolar disorder.  PHYSICAL EXAMINATION: VITAL SIGNS: The patient is afebrile, pulse is 60 to 70, respirations 18 per minute, pulse oximetry 94% on room air, blood pressure is 120/76.  GENERAL APPEARANCE: Not in acute distress. Moderately built and moderately nourished.  HEENT: Normocephalic, atraumatic. Pupils are equally reactive to light and accommodation. No scleral icterus. No conjunctival injection. No sinus tenderness. No postnasal drip. No pharyngeal exudates.  NECK: Supple. No JVD. No thyromegaly. No lymphadenopathy.  LUNGS: Clear to auscultation bilaterally. No accessory muscle usage. No anterior chest wall tenderness on palpation.  CARDIAC: S1, S2 normal. Regular rate and rhythm. No murmurs.  GASTROINTESTINAL: Soft. Bowel sounds are positive in all 4 quadrants. Nontender, nondistended. No masses felt. No hepatosplenomegaly.  NEUROLOGIC: Awake, alert and oriented x3. Cranial nerves II through XII are intact. Motor is intact except in the left lower extremity, where the strength is 3 to 4 out of 5. Sensory is intact. Reflexes are 2+.  SKIN: Warm to touch. Normal turgor. No rashes. No lesions.  EXTREMITIES: No edema. No cyanosis. No clubbing.  MUSCULOSKELETAL: No joint  effusion, tenderness or erythema.  PSYCHIATRIC: Normal mood and affect   LABORATORY AND IMAGING STUDIES: CAT scan of the head: No acute findings. Glucose 103, BUN 8, creatinine 0.6, sodium 142, potassium 3.9, chloride 107, CO2 27, anion gap is 8, GFR greater than 60. WBC 16.7, hemoglobin is 12.9, hematocrit 39.4, platelets are 301. Salicylates 2.9.   ASSESSMENT AND PLAN:  1. Dysarthria with left lower extremity weakness. Will rule out stroke. On the differential is conversion disorder, but the patient denies any stressful situations at home. Will admit her to telemetry under observation. Will provide her aspirin and statin if she passes the bedside swallow evaluation done by nursing. Will get stroke workup with MRI of the brain, carotid Dopplers and 2-D echocardiogram. 2. History of pseudoseizures, not on any medications.  3. Chronic headache, currently stable. Denies any headache now.  4. Chronic obstructive pulmonary disease, stable. Will provide her nebulizer treatments on as-needed basis. 5. Gastroesophageal reflux disease. The patient will be on proton pump inhibitor.  6. Will provide her gastrointestinal and deep vein thrombosis prophylaxis.   CODE STATUS: She is full code.   TOTAL TIME SPENT ON ADMISSION: 45 minutes.  The diagnoses and plan of care were discussed with the patient and her 2 daughters at bedside. They verbalized understanding of the plan.   ____________________________ Ramonita Lab, MD ag:OSi D: 04/07/2013 06:23:29 ET T: 04/07/2013 07:16:43 ET JOB#: [id number]  cc: Ramonita Lab, MD, <Dictator> Ramonita Lab MD ELECTRONICALLY SIGNED 04/10/2013 22:32

## 2015-02-15 NOTE — Discharge Summary (Signed)
PATIENT NAME:  [name], [name] MR#:  [id number] DATE OF BIRTH:  1971/04/21  DATE OF ADMISSION:  04/07/2013 DATE OF DISCHARGE:    PRIMARY CARE PHYSICIAN:  Dr. Maryellen PileEason.  DISCHARGE DIAGNOSES:  Chronic left lower extremity pain and weakness.   HISTORY OF PRESENT ILLNESS:  The patient is a 44 year old female patient with history of arthritis, chronic left lower extremity pain, weakness, presented to the hospital complaining of some slurred speech and left lower extremity weakness.  The patient was thought to be having a stroke by the ER physician and was admitted to the hospitalist service.  The patient mentions that she does get stuttering when she has anxiety.  Her speech was stuttering and not slurring.  The patient did have lower extremity pain on neurological exam.  Minimal weakness which she says is unchanged.  Had MRI of the brain which showed no stroke.  Carotid Doppler showed good ejection fraction without any source of embolus.  Carotid Dopplers showed no significant stenosis.  Echo was fine.  The patient was started on aspirin, statin for prevention of any future strokes.  This plan was discussed with the patient, set up for outpatient physical therapy and is being discharged home in a fair condition.  I have also discussed with patient'Collins husband at bedside.  The patient will follow up with her primary care physician, Dr. Maryellen PileEason in a week and Dr. Sherryll BurgerShah of neurology in 2 to 3 weeks.   DISCHARGE MEDICATIONS: 1.  Aspirin 81 mg oral once a day.  2.  Zocor 20 mg oral once a day.   DISCHARGE INSTRUCTIONS:  Low-fat diet.  Activity as tolerated.  Get physical therapy with outpatient services as scheduled.   Time spent today on this discharge activity was 40 minutes.    ____________________________ Molinda BailiffSrikar R. Adrinne Sze, MD srs:ea D: 04/07/2013 16:15:44 ET T: 04/08/2013 05:10:23 ET JOB#: [id number]  cc: Wardell HeathSrikar R. Latisha Lasch, MD, <Dictator> Serita ShellerErnest B. Maryellen PileEason, MD Dr. Sherryll BurgerShah

## 2015-02-17 NOTE — H&P (Signed)
PATIENT NAME:  [name], KROLIKOWSKI MR#:  [id number] DATE OF BIRTH:  19-Sep-1971  DATE OF ADMISSION:  12/19/2011  REFERRING PHYSICIAN: Dr. Jens Som.   PRIMARY CARE PHYSICIAN:  Dr. Maryellen Pile.   REASON FOR ADMISSION: Left right-sided pain with numbness.   HISTORY OF PRESENT ILLNESS: The patient is a 44 year old female followed by Dr. Maryellen Pile with a significant history of chronic obstructive pulmonary disease/tobacco abuse, pseudoseizures, chronic headaches, and questionable aneurysm in the past. The patient takes two medications, one for joint pain and one for breathing. She does not know the names of these medications. She presents to the Emergency Room tonight with acute onset of left arm and left-sided neck and body pain associated with numbness. No chest pain or shortness of breath. No nausea or vomiting. Had been drinking alcohol at the time of her symptoms. In the Emergency Room, the patient was hemodynamically stable and her head CT was unremarkable. She is now admitted for further evaluation.   PAST MEDICAL HISTORY:  1. Questionable history of cerebral aneurysm.  2. Pseudoseizures.  3. Chronic headaches.  4. Chronic obstructive pulmonary disease/tobacco abuse.  5. Status post hysterectomy.   MEDICATIONS: Unknown.   ALLERGIES: Morphine, shellfish, Tylenol, and Rocephin.   SOCIAL HISTORY: The patient smokes 1 pack per day. No history of alcohol abuse.   FAMILY HISTORY: Positive for diabetes, hypertension, and stroke.   REVIEW OF SYSTEMS: CONSTITUTIONAL: No fever or change in weight. EYES: No blurred or double vision. No glaucoma. ENT: No tinnitus or hearing loss. No nasal discharge or bleeding. No difficulty swallowing. RESPIRATORY: No cough or wheezing. Denies hemoptysis. CARDIOVASCULAR: No chest pain or orthopnea. No palpitations or syncope. GASTROINTESTINAL: No nausea, vomiting, or diarrhea. No abdominal pain. No change in bowel habits. GU: No dysuria or hematuria. No incontinence. ENDOCRINE: No  polyuria or polydipsia. No heat or cold intolerance. HEMATOLOGIC: The patient denies anemia, easy bruising, or bleeding. LYMPHATIC: No swollen glands. MUSCULOSKELETAL: The patient has pain in her neck, left arm, left side, and left hip. Denies gout. NEUROLOGIC: Does have numbness and weakness. Denies migraines. Denies previous stroke. PSYCH: The patient denies anxiety, insomnia, or depression.   PHYSICAL EXAMINATION:  GENERAL: The patient is in no acute distress.   VITAL SIGNS: Vital signs are currently remarkable for a blood pressure of 135/70 with a heart rate of 100 and a respiratory rate of 18. She is afebrile.   HEENT: Normocephalic, atraumatic. Pupils equally round and reactive to light and accommodation. Extraocular movements are intact. Sclerae are anicteric. Conjunctivae are clear. Oropharynx is clear.   NECK: Supple without jugular venous distention or bruits. No adenopathy or thyromegaly is noted.   LUNGS: Clear to auscultation and percussion without wheezes, rales, or rhonchi. No dullness.   CARDIAC: Regular rate and rhythm with normal S1 and S2. No significant rubs, murmurs, or gallops. PMI is nondisplaced. Chest wall is nontender.   ABDOMEN: Soft, nontender, with normoactive bowel sounds. No organomegaly or masses were appreciated. No hernias or bruits were noted.   EXTREMITIES: No clubbing, cyanosis or edema. Pulses were 2+ bilaterally.   SKIN: Warm and dry without rash or lesions.   NEUROLOGIC: Cranial nerves II through XII grossly intact. Deep tendon reflexes were symmetric. Motor and sensory exam nonfocal.   MUSCULOSKELETAL: Did reveal some pain upon motion of the left upper extremity.   PSYCH: The patient was alert and oriented to person, place, and time. She was cooperative and used good judgment.   LABORATORY, DIAGNOSTIC AND RADIOLOGICAL DATA: EKG revealed sinus  rhythm with no acute ischemic changes. Head CT done in the Emergency Room was unremarkable. Urinalysis  showed 62 WBCs per high-power field with 3+ leukocyte esterase. White count was 15.0 with a hemoglobin of 12.6. Glucose was 92 with a BUN of 7 and a creatinine of 0.53 with a sodium of 145 and a potassium of 3.2.   ASSESSMENT:  1. Left-sided pain with paresthesias.  2. Hypokalemia.  3. Presumed urinary tract infection.  4. Tobacco abuse.  5. Questionable history of cerebral aneurysm.   PLAN:  1. The patient will be observed on telemetry because of her left-sided pain and numbness.  2. We will begin Septra DS for her urinary tract infection.  3. We will supplement potassium.  4. We will follow neuro checks q.4 hours.  5. We will check a sedimentation rate urgently and obtain a CT of the cervical spine.  6. We will consult neurology because of the patient's inconsistent exam and history.  7. Further treatment and evaluation will depend upon the patient's progress.   TOTAL TIME SPENT ON THIS PATIENT: 45 minutes.   ____________________________ Duane LopeJeffrey D. Judithann SheenSparks, MD jds:ap D: 12/19/2011 05:13:30 ET T: 12/19/2011 08:45:56 ET JOB#: [id number]  cc: Duane LopeJeffrey D. Judithann SheenSparks, MD, <Dictator> Serita ShellerErnest B. Maryellen PileEason, MD Brittnie Lewey Rodena Medin Syretta Kochel MD ELECTRONICALLY SIGNED 12/20/2011 0:47

## 2015-02-17 NOTE — Discharge Summary (Signed)
PATIENT NAME:  [name], [name] MR#:  [id number] DATE OF BIRTH:  1971-08-25  DATE OF ADMISSION:  12/19/2011 DATE OF DISCHARGE:  12/20/2011  PRESENTING COMPLAINT: Left-sided body pain.   DISCHARGE DIAGNOSES:  1. Left-sided body pain, resolving, likely stress related/mood disorder/possibility of conversion disorder.  2. History of seizure/pseudoseizures.  3. History of aneurysm. 4. History of asthma.  5. Acid reflux.  6. Asymptomatic urinary tract infection.   CONDITION ON DISCHARGE: Fair. Vitals stable.   DISCHARGE MEDICATIONS:  1. Citalopram 40 mg p.o. daily.  2. Daliresp 500 mcg p.o. daily.  3. Dexilant 60 mg daily.  4. Cipro 500 mg p.o. twice a day for 3 days.  DISCHARGE FOLLOWUP: Follow-up with Dr. Brynda Greathouse in 1 to 2 weeks. Psych consultation with Dr. Camie Patience. Neurologic consultation with Dr. Elie Goody, Fellow at [facility]   IMAGING STUDIES: MRI of the cervical spine showed no significant posterior disk bulge. Spinal cord is normal in caliber.   MRI of the brain showed no acute intracranial findings. No acute infarct. Findings likely represent sequela of encephalomalacia from the old prior right parietal lobe hemorrhage.   CT of  cervical spine showed no acute abnormality.   CT of head showed no acute intracranial process.   Chest x-ray showed no acute cardiopulmonary disease.   LABS/STUDIES: ESR is 11.   Urinalysis was positive for urinary tract infection.   Basic metabolic panel was within normal limits. White count was 12.0.  BRIEF SUMMARY OF HOSPITAL COURSE: Ms. Turvey is a 44 year old African American female with past medical history of questionable cerebral aneurysm, history of pseudoseizures, and chronic headaches who came to the emergency room with complaints of left body pain for one day, acute onset. Per family and the patient, she has been undergoing a lot of stress with conflicts with her son at home along with a good friend who passed away. The patient has been  very stressed out. She was admitted with: 1. Left body pain, which was likely stress related: All workup turned out to be negative, in the form of CT of the head, CT of the cervical neck,  MRI of the brain, and MRI of the cervical spine. The patient admitted to being under a lot of stress, has been tearful and has been feeling depressed as well. She was seen by Dr. Elie Goody, [facility] Fellow, from neurology, and there was a possibility for questionable conversion disorder in the setting of stress. The patient was continued on aspirin. She did very well with physical therapy. Prior to discharge she was able to walk around well without any support and no PT needs were recommended. No further work-up per neurology was recommended.  2. Depression with current stressors at home: The patient was seen by Dr. Camie Patience and recommended to start citalopram, which was prescribed. The patient was told it is a generic and should be able to get for 4 dollars from any pharmacy.  3. Chronic headaches, improved.  4. Chronic obstructive pulmonary disease: She was continued on Daliresp.  5. Asymptomatic urinary tract infection: She will complete a course of Cipro.             Her hospital stay otherwise remained stable. The patient did fairly well. Discharge plan was discussed with the patient, and I did have a discussion with the patient's father, on the day of admission, and he did agree with the above management. The patient remained a FULL CODE.   TIME SPENT: 40 minutes. ____________________________ Hart Rochester  Posey Pronto, MD sap:slb D: 12/20/2011 11:45:11 ET T: 12/20/2011 15:16:51 ET JOB#: [id number]  cc: Ambry Dix A. Posey Pronto, MD, <Dictator> Mikeal Hawthorne. Brynda Greathouse, MD Ilda Basset MD ELECTRONICALLY SIGNED 12/30/2011 6:23

## 2015-02-17 NOTE — Consult Note (Signed)
Referring Physician:  Idelle Crouch   Primary Care Physician:  Sherilyn Cooter, [address], [place], [place] [address], [phone]  Reason for Consult:  Admit Date: 19-Dec-2011   Chief Complaint: left-sided pain/numbness/weakness   Reason for Consult: evaluation of left-sided pain/numbness/weakness   History of Present Illness:  History of Present Illness:   [name] is seen in consultation at the request of Dr. Doy Hutching for evaluation of left-sided pain/numbness/weakness. [name] is a 44 year-old right-handed female with a past medical history significant for COPD, chronic headches, questionable history of cerebral aneurysm, and possible pseudoseizure/conversion disorder who was hospitalized on the hospitalist service on 12/19/2011 for symptoms of left-sided pain/numbness/weakness. The patient reports that she had been in her usual state of health throughout the day yesterday, other than a slight headache at the top of her head. Last night, at around 11:30 PM, she was sitting and talking to her husband when she felt that her left arm felt "funny". When she got up to go use the bathroom, she noticed tingling throughout her left leg and fingers in her left hand. Her husband had to help her outside, and the patient subsequently felt weak and "passed out" for about 10-15 minutes. Fortunately, her husband was able to catchher before she fell to the ground. Husband reports that the patient could open her eyes during this time, but was minimally responsive. She started waking up after EMS arrived. The patient reprots that currently, she continues to have a tingling sensation in her left leg and in the fingers of her left hand. In addition, she has significant pain in her left arm, left sideof her neck, and left side of her face. She reports that her left side is weak beauseof the pain, though she was able to ambulate to the bathroom with ehr husband's assitance  earlier this morning. The patient denies any recent head/neck trauma. Of note, the patient does endorse that she has been under a lot of stress recently regarding her son. Furthermore, a close friend of hers died yesterday.  ROS:   General pain    HEENT blurred vision    Lungs cough  SOB    Cardiac had chest pain prior to onset of left arm symptoms, though currently resolved    GI no complaints    GU no complaints    Musculoskeletal joint pain    Extremities pain    Skin no complaints    Neuro headache    Endocrine no complaints    Psych lots of stress   Past Medical/ Surgical Hx:   Past Medical History Questionable history of cerebral aneurysm.  Pseudoseizures/conversion disorder Chronic headaches.  Chronic obstructive pulmonary disease/tobacco abuse.    Past Surgical History S/p hysterectomy.   [facility] Current Meds:  Sodium Chloride 0.45%, 1000 ml     Potassium Chloride injection 40 mEq at 100 ml/hr  Pantoprazole tablet, 40 mg Oral daily  - Indication: Erosive Esophagitis/ GERD  Instructions:  DO NOT CRUSH  Diazepam injection,  ( Valium injection )  2 mg, IV push, q6h PRN for anxiety  Indication: Anxiety/ Convulsive Disorders/ Muscle Relaxation/ Alcohol Withdrawal  Tramadol tablet,  ( Ultram)  50 mg Oral q6h PRN for pain  - Indication: Pain  Aspirin Enteric Coated tablet, ( Ecotrin)  81 mg Oral daily  - Indication: Pain/Fever/Thromboembolic Disorders/Post MI/Prophylaxis MI  Instructions:  Initiate Bleeding Precautions Protocol--DO NOT CRUSH  Ondansetron injection,  ( Zofran injection )  4 mg, IV push, q4h PRN for nausea, vomiting  Indication: Nausea/ Vomiting  Sulfamethoxazole-Trimethoprim DS tablet,  ( Septra DS)  1 tablet(s) Oral q12h  - Indication: Infection  Nicotine Patch,  ( Nicoderm CQ patch )  21 mg Transdermal daily  -Indication:Smoking Cessation  Instructions:  [Waste Code: Black with pkg]  Influenza Virus Trivalent Vaccine injection, 0.5  ml, Intramuscular, atdischarge  Indication: Provide Active Immunity to Influenza Strains contained in Vaccine, GIVE ON DISCHARGE DAY.  Allergies:  Tylenol: Other  Rocephin: Other  Morphine: Itching  Shellfish: Swelling  Social/Family History:  Social History: Lives at home with her husband. Smokes 1/3 ppd x 10 years. Social drinker on the weekends. Denies illicit drug use. Endorses being under a lot of stress lately.   Family History: Notable for hypertension, diabetes, stroke, seizures, and cancer.   Vital Signs: **Vital Signs.:   23-Feb-13 08:00   Vital Signs Type Routine   Temperature Temperature (F) 98   Celsius 36.6   Temperature Source oral   Pulse Pulse 96   Pulse source per Dinamap   Respirations Respirations 18   Systolic BP Systolic BP 878   Diastolic BP (mmHg) Diastolic BP (mmHg) 77   Mean BP 89   BP Source Dinamap   Pulse Ox % Pulse Ox % 97   Oxygen Delivery Room Air/ 21 %   Physical Exam:  General: No acute distress   HEENT: Moist mucous membranes   Neck: No carotid bruits   Chest: CTAB   Cardiac: RRR, no murmurs/gallops/rubs   Extremities: Tender to palpation of left arm, left leg, and left side of her entire upper and lower back   Neurologic Exam:  Mental Status: Alert and oriented to person, place, and time. Attention and concentration within normal limits. Speech without dysarthria, able to name and repeat without difficulty.   Cranial Nerves: Visual fields intact. PERRL. EOMI. Facial sensation diminished by about 25% to light touch, pinprick, and temperature sense along entire V1-V3 distribution of left side of face. She also endorses diminished vibratory sense to tuning fork on left side of forehead compared with right side of forehead. Face symmetrical. Hearing grossly intact. Full shoulder shrug on right, limited on left due to pain. Tongue protrudes midline   Motor Exam: Normal bulk and tone. No pronator drift on right arm, patient reports she is unable  to extend her left arm due to pain.  Left upper extremity: Strength exam is greatly limited by poor effort due to pain. Grossly, patient exhibits 4-/5 deltoid, 4/5 bicep, 4/5 tricep, 4+/5 interosseous, 4/5 hand grip   Right upper extremity: 5/5 deltoid, bicep, tricep, interosseous, hand grip   Left lower extremity: Greatly limited by pain. Wiggled toes, but otherwise would not move left leg due to pain  Right lower extremity: 5/5 iliopsoas, knee extension/flexion, foot dorsi/plantarflexion   Deep Tendon Reflexes: Symmetric and 2+ throughout. Toes equivocal bilaterally.   Sensory Exam: Sensory exam diminished by about 25% to light touch, pin prick and temperature throughout left arm, left leg, and left side of face.   Coordination: Finger to nose intact on right, unable to test on left due to pain with movement.   Cerebellar Exam: No truncal ataxia.   Gait: Unable to test gait due to patient's persistent pain.   Lab Results: Routine Chem:  23-Feb-13 01:24    Glucose, Serum 92   BUN 7   Creatinine (comp)   0.53   Sodium, Serum 145   Potassium, Serum   3.2  Chloride, Serum 107   CO2, Serum 23   Calcium (Total), Serum 9.0   Anion Gap 15   Osmolality (calc) 286   eGFR (African American) >60   eGFR (Non-African American) >60  Cardiac:  23-Feb-13 01:24    Troponin I < 0.02  Routine UA:  23-Feb-13 02:21    Color (UA) Straw   Clarity (UA) Hazy   Glucose (UA) Negative   Bilirubin (UA) Negative   Ketones (UA) Negative   Specific Gravity (UA) 1.004   Blood (UA) 3+   pH (UA) 8.0   Protein (UA) Negative   Nitrite (UA) Negative   Leukocyte Esterase (UA) 3+   RBC (UA) 32 /HPF   WBC (UA) 62 /HPF   Epithelial Cells (UA) 6 /HPF   Mucous (UA) PRESENT  Routine Hem:  23-Feb-13 01:24    WBC (CBC)   15.0   RBC (CBC) 4.64   Hemoglobin (CBC) 12.6   Hematocrit (CBC) 38.7   Platelet Count (CBC) 310   MCV 83   MCH 27.2   MCHC 32.7   RDW 13.6    05:53    Erythrocyte Sed Rate 11    Radiology Results: CT:    23-Feb-13 04:25, CT Head Without Contrast   CT Head Without Contrast    REASON FOR EXAM:    left sided numbness  COMMENTS:       PROCEDURE: CT  - CT HEAD WITHOUT CONTRAST  - Dec 19 2011  4:25AM     RESULT: Comparison:  01/25/2011    Technique: Multiple axial images from the foramen magnum to the vertex   were obtained without IV contrast.    Findings:    There is no evidence for mass effect, midline shift, or extra-axial fluid   collections. There is no evidence for space-occupying lesion,   intracranial hemorrhage, or cortical-based area of infarction.     The osseousstructures are unremarkable.    IMPRESSION:    No acute intracranial process.    CT can underestimate ischemia in the first 24 hours after the event. If   there is clinical concern for an acute infarct, a followup MRI or repeat   CT scan in 24 hours may provide additional information.          Verified By: Gregor Hams, M.D., MD   Impression/Recommendations:  Recommendations:   44 year-old right-handed female with a past medical history significant for COPD, chronic headches, questionable history of cerebral aneurysm, and possible pseudoseizure/conversion disorder who was hospitalized on the hospitalist service on 12/19/2011 for symptoms of left-sided pain/numbness/weakness.  pain/numbness/weakness: Patient reports acute-onset of pain/numbness/weakness throughout the left side of her body which started late last night. Her neurological exam is limited due to the extreme amount of pain that she endorses with any movement of her left arm and left leg. She does have some diminished sensation to all modalities throughout the enitre left side of her body, though she also endorses diminished vibratory sense to tuning fork  when it is placed on the left side of her forehead compared with the right side, which is an inconsistency with her exam. Overall, it is verydifficult to localize her  symptoms based on the limtied exam. It should be noted that the patient endorses being under a lot of stress and just learned of a close friend's death yesterday. Given her previous possible history of non-epileptic seizures/conversion disorder, it's possible that her stress may be playing a role with her symptoms, though this should be  a diagnosis of exclusion. recommend checking MRI brain/MRI cervical spine to rule-out any evidence of infarct or mass lesion, as patient's limited exam makes it difficult to localize her symptoms.with starting patient on aspirin for now.imaging is unremarkable, it would be likely that her symptoms are related to her stress, and the patient might benefit from a psychiatry consult in that case.any case, patient would likely benefit from woring with PT/OT to help regain her strength. communicated with Dr. Posey Pronto.  Electronic Signatures: Rennis Chris (MD)  (Signed [phone] 10:54)  Authored: REFERRING PHYSICIAN, Primary Care Physician, Consult, History of Present Illness, Review of Systems, PAST MEDICAL/SURGICAL HISTORY, Current Medications, ALLERGIES, Social/Family History, NURSING VITAL SIGNS, Physical Exam-, LAB RESULTS, RADIOLOGY RESULTS, Recommendations   Last Updated: 23-Feb-13 10:54 by Rennis Chris (MD)

## 2015-03-11 ENCOUNTER — Encounter: Payer: Self-pay | Admitting: Emergency Medicine

## 2015-03-11 ENCOUNTER — Emergency Department
Admission: EM | Admit: 2015-03-11 | Discharge: 2015-03-11 | Disposition: A | Discharge status: Home / Self Care | Payer: Managed Care, Other (non HMO) | Attending: Emergency Medicine | Admitting: Emergency Medicine

## 2015-03-11 ENCOUNTER — Emergency Department: Payer: Managed Care, Other (non HMO)

## 2015-03-11 DIAGNOSIS — Z79899 Other long term (current) drug therapy: Secondary | ICD-10-CM | POA: Insufficient documentation

## 2015-03-11 DIAGNOSIS — Z720 Tobacco use: Secondary | ICD-10-CM | POA: Insufficient documentation

## 2015-03-11 DIAGNOSIS — G43009 Migraine without aura, not intractable, without status migrainosus: Secondary | ICD-10-CM | POA: Diagnosis not present

## 2015-03-11 DIAGNOSIS — Z880 Allergy status to penicillin: Secondary | ICD-10-CM | POA: Insufficient documentation

## 2015-03-11 DIAGNOSIS — Z791 Long term (current) use of non-steroidal anti-inflammatories (NSAID): Secondary | ICD-10-CM | POA: Diagnosis not present

## 2015-03-11 DIAGNOSIS — R51 Headache: Secondary | ICD-10-CM | POA: Diagnosis present

## 2015-03-11 HISTORY — DX: Headache, unspecified: R51.9

## 2015-03-11 HISTORY — DX: Headache: R51

## 2015-03-11 LAB — COMPREHENSIVE METABOLIC PANEL
ALT: 21 U/L (ref 14–54)
ANION GAP: 12 (ref 5–15)
AST: 25 U/L (ref 15–41)
Albumin: 3.8 g/dL (ref 3.5–5.0)
Alkaline Phosphatase: 67 U/L (ref 38–126)
BILIRUBIN TOTAL: 0.6 mg/dL (ref 0.3–1.2)
BUN: 8 mg/dL (ref 6–20)
CALCIUM: 9.2 mg/dL (ref 8.9–10.3)
CHLORIDE: 109 mmol/L (ref 101–111)
CO2: 21 mmol/L — AB (ref 22–32)
CREATININE: 0.6 mg/dL (ref 0.44–1.00)
GFR calc Af Amer: >60 mL/min (ref >60)
Glucose, Bld: 92 mg/dL (ref 65–99)
Potassium: 4 mmol/L (ref 3.5–5.1)
Sodium: 142 mmol/L (ref 135–145)
Total Protein: 6.9 g/dL (ref 6.5–8.1)

## 2015-03-11 LAB — CBC WITH DIFFERENTIAL/PLATELET
Basophils Absolute: 0.1 10*3/uL (ref 0–0.1)
Basophils Relative: 1 %
EOS PCT: 2 %
Eosinophils Absolute: 0.3 10*3/uL (ref 0–0.7)
HCT: 40.4 % (ref 35.0–47.0)
Hemoglobin: 13 g/dL (ref 12.0–16.0)
LYMPHS PCT: 25 %
Lymphs Abs: 4.1 10*3/uL — ABNORMAL HIGH (ref 1.0–3.6)
MCH: 26.2 pg (ref 26.0–34.0)
MCHC: 32.2 g/dL (ref 32.0–36.0)
MCV: 81.2 fL (ref 80.0–100.0)
MONO ABS: 0.7 10*3/uL (ref 0.2–0.9)
MONOS PCT: 5 %
NEUTROS ABS: 11.1 10*3/uL — AB (ref 1.4–6.5)
Neutrophils Relative %: 67 %
Platelets: 310 10*3/uL (ref 150–440)
RBC: 4.98 MIL/uL (ref 3.80–5.20)
RDW: 14.3 % (ref 11.5–14.5)
WBC: 16.3 10*3/uL — AB (ref 3.6–11.0)

## 2015-03-11 MED ORDER — DIPHENHYDRAMINE HCL 50 MG/ML IJ SOLN
25.0000 mg | Freq: Once | INTRAMUSCULAR | Status: AC
  Administered 2015-03-11: 25 mg via INTRAVENOUS

## 2015-03-11 MED ORDER — DIPHENHYDRAMINE HCL 50 MG/ML IJ SOLN
INTRAMUSCULAR | Status: AC
  Administered 2015-03-11: 25 mg via INTRAVENOUS
  Filled 2015-03-11: qty 1

## 2015-03-11 MED ORDER — DEXTROSE 5 % IV SOLN
20.0000 mg | Freq: Once | INTRAVENOUS | Status: AC
  Administered 2015-03-11: 20 mg via INTRAVENOUS

## 2015-03-11 MED ORDER — DEXAMETHASONE SODIUM PHOSPHATE 10 MG/ML IJ SOLN
10.0000 mg | Freq: Once | INTRAMUSCULAR | Status: AC
  Administered 2015-03-11: 10 mg via INTRAVENOUS

## 2015-03-11 MED ORDER — ONDANSETRON HCL 4 MG/2ML IJ SOLN
4.0000 mg | INTRAMUSCULAR | Status: AC
  Administered 2015-03-11: 4 mg via INTRAVENOUS

## 2015-03-11 MED ORDER — ONDANSETRON HCL 4 MG/2ML IJ SOLN
INTRAMUSCULAR | Status: AC
  Administered 2015-03-11: 4 mg via INTRAVENOUS
  Filled 2015-03-11: qty 2

## 2015-03-11 MED ORDER — DEXAMETHASONE SODIUM PHOSPHATE 10 MG/ML IJ SOLN
INTRAMUSCULAR | Status: AC
  Administered 2015-03-11: 10 mg via INTRAVENOUS
  Filled 2015-03-11: qty 1

## 2015-03-11 MED ORDER — SODIUM CHLORIDE 0.9 % IV BOLUS (SEPSIS)
1000.0000 mL | INTRAVENOUS | Status: AC
  Administered 2015-03-11: 1000 mL via INTRAVENOUS

## 2015-03-11 MED ORDER — METOCLOPRAMIDE HCL 10 MG PO TABS: 10.0000 mg | ORAL_TABLET | Freq: Three times a day (TID) | ORAL | Status: DC

## 2015-03-11 MED ORDER — METOCLOPRAMIDE HCL 5 MG/ML IJ SOLN
INTRAMUSCULAR | Status: AC
  Filled 2015-03-11: qty 4

## 2015-03-11 MED ORDER — KETOROLAC TROMETHAMINE 30 MG/ML IJ SOLN
INTRAMUSCULAR | Status: AC
  Administered 2015-03-11: 30 mg via INTRAVENOUS
  Filled 2015-03-11: qty 1

## 2015-03-11 MED ORDER — KETOROLAC TROMETHAMINE 30 MG/ML IJ SOLN
30.0000 mg | Freq: Once | INTRAMUSCULAR | Status: AC
  Administered 2015-03-11: 30 mg via INTRAVENOUS

## 2015-03-11 MED ORDER — DIPHENHYDRAMINE HCL 25 MG PO CAPS: 50.0000 mg | ORAL_CAPSULE | Freq: Four times a day (QID) | ORAL | Status: DC | PRN

## 2015-03-11 NOTE — ED Notes (Signed)
Pt to ed with c/o headache that started on Friday.  Pt states hx of anerysm in 2009. Pt states pain is primarily on right side of head.  Reports photosensitivity and nausea and vomiting today.

## 2015-03-11 NOTE — Discharge Instructions (Signed)
Recurrent Migraine Headache °A migraine headache is very bad, throbbing pain on one or both sides of your head. Recurrent migraines keep coming back. Talk to your doctor about what things may bring on (trigger) your migraine headaches. °HOME CARE °· Only take medicines as told by your doctor. °· Lie down in a dark, quiet room when you have a migraine. °· Keep a journal to find out if certain things bring on migraine headaches. For example, write down: °¨ What you eat and drink. °¨ How much sleep you get. °¨ Any change to your diet or medicines. °· Lessen how much alcohol you drink. °· Quit smoking if you smoke. °· Get enough sleep. °· Lessen any stress in your life. °· Keep lights dim if bright lights bother you or make your migraines worse. °GET HELP IF: °· Medicine does not help your migraines. °· Your pain keeps coming back. °· You have a fever. °GET HELP RIGHT AWAY IF:  °· Your migraine becomes really bad. °· You have a stiff neck. °· You have trouble seeing. °· Your muscles are weak, or you lose muscle control. °· You lose your balance or have trouble walking. °· You feel like you will pass out (faint), or you pass out. °· You have really bad symptoms that are different than your first symptoms. °MAKE SURE YOU:  °· Understand these instructions. °· Will watch your condition. °· Will get help right away if you are not doing well or get worse. °Document Released: 07/21/2008 Document Revised: 10/17/2013 Document Reviewed: 06/19/2013 °ExitCare® Patient Information ©2015 [employer]. This information is not intended to replace advice given to you by your health care provider. Make sure you discuss any questions you have with your health care provider. ° °

## 2015-03-11 NOTE — ED Provider Notes (Signed)
[facility] Emergency Department Provider Note  ____________________________________________  Time seen: 12:20 PM  I have reviewed the triage vital signs and the nursing notes.   HISTORY  Chief Complaint Headache    HPI [name] is a 44 y.o. female who complains of right occipital headache for the past 4 days. The pain is similar to her previous migraine headaches. With these. She usually gets twitching of the left face and tingling of the left arm and leg, as well as nausea and vomiting which she has had today. She reports this is chronic and she follows up with a neurologist for this. [facility] at [facility].Denies any other numbness, tingling or weakness or vision changes. No syncope, chest pain, shortness of breath, fever, chills. No other symptoms. She does report that with these headaches. She comes sometimes has. She seizures, but she normally maintains consciousness throughout and has no urinary incontinence or postseizure confusion or paralysis. There are no other atypical or unusual features for this headache. It is otherwise consistent with her usual migraine headaches     Past Medical History  Diagnosis Date  . Seizures   . Aneurysm   . Headache     There are no active problems to display for this patient.   Past Surgical History  Procedure Laterality Date  . Abdominal hysterectomy      Current Outpatient Rx  Name  Route  Sig  Dispense  Refill  . diclofenac (VOLTAREN) 25 MG EC tablet   Oral   Take 25 mg by mouth 2 (two) times daily.         . diphenhydrAMINE (BENADRYL) 25 MG tablet   Oral   Take 25 mg by mouth every 6 (six) hours as needed for allergies.         Marland Kitchen. gabapentin (NEURONTIN) 300 MG capsule   Oral   Take 300 mg by mouth daily.         Marland Kitchen. ibuprofen (ADVIL,MOTRIN) 200 MG tablet   Oral   Take 400 mg by mouth every 6 (six) hours as needed for mild pain or moderate pain.         . pregabalin (LYRICA) 25 MG  capsule   Oral   Take 25 mg by mouth daily.         . metoCLOPramide (REGLAN) 10 MG tablet   Oral   Take 1 tablet (10 mg total) by mouth every 8 (eight) hours as needed for nausea. Patient not taking: Reported on 03/11/2015   8 tablet   0     Allergies Amoxicillin; Morphine and related; Penicillins; and Shellfish allergy  No family history on file.  Social History History  Substance Use Topics  . Smoking status: Current Every Day Smoker    Types: Cigarettes  . Smokeless tobacco: Not on file  . Alcohol Use: Yes    Review of Systems  Constitutional: No fever or chills. No weight changes Eyes:No blurry vision or double vision.  ENT: No sore throat. Cardiovascular: No chest pain. Respiratory: No dyspnea or cough. Gastrointestinal: Negative for abdominal pain. Genitourinary: Negative for dysuria, urinary retention, bloody urine, or difficulty urinating. Musculoskeletal: Negative for back pain. Chronic left knee and left leg pain  Skin: Negative for rash. Neurological: Negative for headaches, focal weakness or numbness. Psychiatric:No anxiety or depression.   Endocrine:No hot/cold intolerance, changes in energy, or sleep difficulty.  10-point ROS otherwise negative.  ____________________________________________   PHYSICAL EXAM:  VITAL SIGNS: ED Triage Vitals  Enc  Vitals Group     BP 03/11/15 0946 122/74 mmHg     Pulse Rate 03/11/15 0946 78     Resp 03/11/15 0946 18     Temp 03/11/15 0946 97.8 F (36.6 C)     Temp Source 03/11/15 0946 Oral     SpO2 03/11/15 0946 98 %     Weight 03/11/15 0946 192 lb (87.091 kg)     Height 03/11/15 0946 5\' 4"  (1.626 m)     Head Cir --      Peak Flow --      Pain Score 03/11/15 0948 10     Pain Loc --      Pain Edu? --      Excl. in GC? --      Constitutional: Alert and oriented. Well appearing and in no distress. Eyes: No scleral icterus. No conjunctival pallor. PERRL. EOMI ENT   Head: Normocephalic and  atraumatic. Pain with percussion of right frontal sinus. Normal TMs    Nose: No congestion/rhinnorhea. No septal hematoma   Mouth/Throat: MMM, no pharyngeal erythema. No peritonsillar mass. No uvula shift.   Neck: No stridor. No SubQ emphysema. No meningismus. No bruit Hematological/Lymphatic/Immunilogical: No cervical lymphadenopathy. Cardiovascular: RRR. Normal and symmetric distal pulses are present in all extremities. No murmurs, rubs, or gallops. Respiratory: Normal respiratory effort without tachypnea nor retractions. Breath sounds are clear and equal bilaterally. No wheezes/rales/rhonchi. Gastrointestinal: Soft and nontender. No distention. There is no CVA tenderness.  No rebound, rigidity, or guarding. Genitourinary: deferred Musculoskeletal: Nontender with normal range of motion in all extremities. No joint effusions.  No lower extremity tenderness.  No edema. Neurologic:   Normal speech and language.  Photophobia on right eye CN 2-10 normal. Motor grossly intact. No pronator drift.  Normal gait. No gross focal neurologic deficits are appreciated.  Skin:  Skin is warm, dry and intact. No rash noted.  No petechiae, purpura, or bullae. Psychiatric: Mood and affect are normal. Speech and behavior are normal. Patient exhibits appropriate insight and judgment.  ____________________________________________    LABS (pertinent positives/negatives) (all labs ordered are listed, but only abnormal results are displayed) Labs Reviewed  CBC WITH DIFFERENTIAL/PLATELET - Abnormal; Notable for the following:    WBC 16.3 (*)    Neutro Abs 11.1 (*)    Lymphs Abs 4.1 (*)    All other components within normal limits  COMPREHENSIVE METABOLIC PANEL   ____________________________________________   EKG    ____________________________________________    RADIOLOGY  CT head  unremarkable  ____________________________________________   PROCEDURES  ____________________________________________   INITIAL IMPRESSION / ASSESSMENT AND PLAN / ED COURSE  Pertinent labs & imaging results that were available during my care of the patient were reviewed by me and considered in my medical decision making (see chart for details).  Patient given a migraine cocktail on arrival. On reassessment about 12:30 PM she is feeling better with pain decreased to 7 out of 10, and much more manageable and tolerable. No nausea or vomiting in the ED. Patient will follow-up with her neurologist this week for further monitoring of her symptoms  ____________________________________________   FINAL CLINICAL IMPRESSION(S) / ED DIAGNOSES  Final diagnoses:  None  Acute migraine headache     Sharman CheekPhillip Syann Cupples, MD 03/11/15 1257

## 2015-03-11 NOTE — ED Notes (Signed)
Family of pt reports pt was experiencing a seizure. MD and RN Romeo Apple( Ben) at bedside.

## 2015-03-11 NOTE — ED Notes (Signed)
Pt informed to return if any life threatening symptoms occur.  

## 2015-03-11 NOTE — ED Notes (Signed)
Patient transported to CT 

## 2015-05-01 DIAGNOSIS — Z833 Family history of diabetes mellitus: Secondary | ICD-10-CM | POA: Insufficient documentation

## 2015-10-01 ENCOUNTER — Emergency Department (HOSPITAL_COMMUNITY): Payer: Managed Care, Other (non HMO)

## 2015-10-01 ENCOUNTER — Emergency Department (HOSPITAL_COMMUNITY)
Admission: EM | Admit: 2015-10-01 | Discharge: 2015-10-01 | Disposition: A | Discharge status: Home / Self Care | Payer: Managed Care, Other (non HMO) | Attending: Emergency Medicine | Admitting: Emergency Medicine

## 2015-10-01 DIAGNOSIS — R51 Headache: Secondary | ICD-10-CM | POA: Diagnosis present

## 2015-10-01 DIAGNOSIS — G43809 Other migraine, not intractable, without status migrainosus: Secondary | ICD-10-CM | POA: Insufficient documentation

## 2015-10-01 DIAGNOSIS — M6289 Other specified disorders of muscle: Secondary | ICD-10-CM | POA: Diagnosis not present

## 2015-10-01 DIAGNOSIS — R531 Weakness: Secondary | ICD-10-CM

## 2015-10-01 DIAGNOSIS — G43009 Migraine without aura, not intractable, without status migrainosus: Secondary | ICD-10-CM

## 2015-10-01 DIAGNOSIS — Z880 Allergy status to penicillin: Secondary | ICD-10-CM | POA: Insufficient documentation

## 2015-10-01 LAB — COMPREHENSIVE METABOLIC PANEL
ALK PHOS: 66 U/L (ref 38–126)
ALT: 22 U/L (ref 14–54)
ANION GAP: 13 (ref 5–15)
AST: 26 U/L (ref 15–41)
Albumin: 4 g/dL (ref 3.5–5.0)
BILIRUBIN TOTAL: 0.5 mg/dL (ref 0.3–1.2)
BUN: 8 mg/dL (ref 6–20)
CALCIUM: 9.4 mg/dL (ref 8.9–10.3)
CO2: 21 mmol/L — ABNORMAL LOW (ref 22–32)
CREATININE: 0.58 mg/dL (ref 0.44–1.00)
Chloride: 108 mmol/L (ref 101–111)
Glucose, Bld: 84 mg/dL (ref 65–99)
Potassium: 3.6 mmol/L (ref 3.5–5.1)
SODIUM: 142 mmol/L (ref 135–145)
TOTAL PROTEIN: 7.2 g/dL (ref 6.5–8.1)

## 2015-10-01 LAB — DIFFERENTIAL
BASOS PCT: 0 %
Basophils Absolute: 0 10*3/uL (ref 0.0–0.1)
Eosinophils Absolute: 0.2 10*3/uL (ref 0.0–0.7)
Eosinophils Relative: 1 %
LYMPHS ABS: 6 10*3/uL — AB (ref 0.7–4.0)
LYMPHS PCT: 38 %
MONOS PCT: 5 %
Monocytes Absolute: 0.8 10*3/uL (ref 0.1–1.0)
NEUTROS ABS: 8.7 10*3/uL — AB (ref 1.7–7.7)
NEUTROS PCT: 56 %

## 2015-10-01 LAB — CBC
HCT: 42.3 % (ref 36.0–46.0)
HEMOGLOBIN: 13.5 g/dL (ref 12.0–15.0)
MCH: 26.4 pg (ref 26.0–34.0)
MCHC: 31.9 g/dL (ref 30.0–36.0)
MCV: 82.6 fL (ref 78.0–100.0)
PLATELETS: 300 10*3/uL (ref 150–400)
RBC: 5.12 MIL/uL — AB (ref 3.87–5.11)
RDW: 14.6 % (ref 11.5–15.5)
WBC: 15.7 10*3/uL — ABNORMAL HIGH (ref 4.0–10.5)

## 2015-10-01 LAB — I-STAT CHEM 8, ED
BUN: 8 mg/dL (ref 6–20)
CALCIUM ION: 1.19 mmol/L (ref 1.12–1.23)
CHLORIDE: 105 mmol/L (ref 101–111)
Creatinine, Ser: 0.6 mg/dL (ref 0.44–1.00)
GLUCOSE: 81 mg/dL (ref 65–99)
HCT: 46 % (ref 36.0–46.0)
HEMOGLOBIN: 15.6 g/dL — AB (ref 12.0–15.0)
Potassium: 3.4 mmol/L — ABNORMAL LOW (ref 3.5–5.1)
SODIUM: 145 mmol/L (ref 135–145)
TCO2: 23 mmol/L (ref 0–100)

## 2015-10-01 LAB — I-STAT TROPONIN, ED: TROPONIN I, POC: 0 ng/mL (ref 0.00–0.08)

## 2015-10-01 LAB — PROTIME-INR
INR: 1.12 (ref 0.00–1.49)
PROTHROMBIN TIME: 14.6 s (ref 11.6–15.2)

## 2015-10-01 LAB — APTT: aPTT: 26 seconds (ref 24–37)

## 2015-10-01 MED ORDER — KETOROLAC TROMETHAMINE 60 MG/2ML IM SOLN
60.0000 mg | Freq: Once | INTRAMUSCULAR | Status: AC
  Administered 2015-10-01: 60 mg via INTRAMUSCULAR
  Filled 2015-10-01: qty 2

## 2015-10-01 MED ORDER — ONDANSETRON HCL 4 MG/2ML IJ SOLN: 4.0000 mg | Freq: Once | INTRAMUSCULAR | Status: DC

## 2015-10-01 MED ORDER — ONDANSETRON 4 MG PO TBDP
4.0000 mg | ORAL_TABLET | Freq: Once | ORAL | Status: AC
  Administered 2015-10-01: 4 mg via ORAL

## 2015-10-01 MED ORDER — DIPHENHYDRAMINE HCL 25 MG PO TABS: 25.0000 mg | ORAL_TABLET | Freq: Four times a day (QID) | ORAL | Status: DC

## 2015-10-01 MED ORDER — DIPHENHYDRAMINE HCL 50 MG/ML IJ SOLN
12.5000 mg | Freq: Once | INTRAMUSCULAR | Status: DC
  Filled 2015-10-01: qty 1

## 2015-10-01 MED ORDER — PROCHLORPERAZINE EDISYLATE 5 MG/ML IJ SOLN
10.0000 mg | Freq: Once | INTRAMUSCULAR | Status: DC
  Filled 2015-10-01: qty 2

## 2015-10-01 MED ORDER — ONDANSETRON HCL 4 MG/2ML IJ SOLN
INTRAMUSCULAR | Status: AC
  Filled 2015-10-01: qty 2

## 2015-10-01 MED ORDER — ONDANSETRON 4 MG PO TBDP
ORAL_TABLET | ORAL | Status: AC
  Filled 2015-10-01: qty 1

## 2015-10-01 MED ORDER — METOCLOPRAMIDE HCL 10 MG PO TABS: 10.0000 mg | ORAL_TABLET | Freq: Four times a day (QID) | ORAL | Status: DC | PRN

## 2015-10-01 MED ORDER — PROCHLORPERAZINE EDISYLATE 5 MG/ML IJ SOLN
10.0000 mg | Freq: Once | INTRAMUSCULAR | Status: AC
  Administered 2015-10-01: 10 mg via INTRAMUSCULAR

## 2015-10-01 MED ORDER — ONDANSETRON 4 MG PO TBDP: ORAL_TABLET | ORAL | Status: DC

## 2015-10-01 MED ORDER — IBUPROFEN 800 MG PO TABS: 800.0000 mg | ORAL_TABLET | Freq: Three times a day (TID) | ORAL | Status: DC

## 2015-10-01 MED ORDER — DIPHENHYDRAMINE HCL 50 MG/ML IJ SOLN
25.0000 mg | Freq: Once | INTRAMUSCULAR | Status: AC
  Administered 2015-10-01: 25 mg via INTRAMUSCULAR

## 2015-10-01 MED ORDER — KETOROLAC TROMETHAMINE 30 MG/ML IJ SOLN
30.0000 mg | Freq: Once | INTRAMUSCULAR | Status: DC
  Filled 2015-10-01: qty 1

## 2015-10-01 NOTE — ED Provider Notes (Signed)
CSN: [id number]     Arrival date & time 10/01/15  1126 History   First MD Initiated Contact with Patient 10/01/15 1138     Chief Complaint  Patient presents with  . Code Stroke    @ (Consider location/radiation/quality/duration/timing/severity/associated sxs/prior Treatment) HPI Patient is per history of ICH and migraines. She states she's had a headache for about 3 days. It was more gradual in onset. She reports however continued to worsen significantly. She states her whole head has a terrible pressure and it that is in the back of her head as well. Today she developed weakness on the left side. She reports she has had migraines with left-sided weakness previously but it was more pronounced today. She also reports his headache is lasted longer than typical. She developed vomiting today as well. No fever or chills. NO general illness. Patient used to have migraine medications but she reports she doesn't really have any specific migraine medications anymore. In the past she had gone to go for neurologic. She then however change to a different neurologist and she reports since then she has been having more difficult controlling headaches and problems with syncope as well. No past medical history on file. No past surgical history on file. No family history on file. Social History  Substance Use Topics  . Smoking status: Not on file  . Smokeless tobacco: Not on file  . Alcohol Use: Not on file   OB History    No data available     Review of Systems  10 Systems reviewed and are negative for acute change except as noted in the HPI.   Allergies  Morphine and related and Penicillins  Home Medications   Prior to Admission medications   Medication Sig Start Date End Date Taking? Authorizing Provider  diphenhydrAMINE (BENADRYL) 25 MG tablet Take 1 tablet (25 mg total) by mouth every 6 (six) hours. 10/01/15   Arby Barrette, MD  ibuprofen (ADVIL,MOTRIN) 800 MG tablet Take 1  tablet (800 mg total) by mouth 3 (three) times daily. 10/01/15   Arby Barrette, MD  metoCLOPramide (REGLAN) 10 MG tablet Take 1 tablet (10 mg total) by mouth every 6 (six) hours as needed for nausea (nausea/headache). 10/01/15   Arby Barrette, MD  ondansetron (ZOFRAN ODT) 4 MG disintegrating tablet  ODT q4 hours prn nausea/vomit 10/01/15   Arby Barrette, MD   BP 121/78 mmHg  Pulse 68  Temp(Src) 98 F (36.7 C) (Oral)  Resp 19  SpO2 99% Physical Exam  Constitutional: She is oriented to person, place, and time. She appears well-developed and well-nourished.  Patient is vomiting and appears uncomfortable. She is alert and does not show signs of confusion.  HENT:  Head: Normocephalic and atraumatic.  Right Ear: External ear normal.  Left Ear: External ear normal.  Nose: Nose normal.  Mouth/Throat: Oropharynx is clear and moist.  Eyes: EOM are normal. Pupils are equal, round, and reactive to light.  Neck: Neck supple.  Cardiovascular: Normal rate, regular rhythm, normal heart sounds and intact distal pulses.   Pulmonary/Chest: Effort normal and breath sounds normal.  Abdominal: Soft. Bowel sounds are normal. She exhibits no distension. There is no tenderness.  Musculoskeletal: Normal range of motion. She exhibits no edema.  Neurological: She is alert and oriented to person, place, and time. She has normal strength. No cranial nerve deficit. She exhibits normal muscle tone. Coordination normal. GCS eye subscore is 4. GCS verbal subscore is 5. GCS motor subscore is 6.  Skin: Skin is  warm, dry and intact.    ED Course  Procedures (including critical care time) Labs Review Labs Reviewed  CBC - Abnormal; Notable for the following:    WBC 15.7 (*)    RBC 5.12 (*)    All other components within normal limits  DIFFERENTIAL - Abnormal; Notable for the following:    Neutro Abs 8.7 (*)    Lymphs Abs 6.0 (*)    All other components within normal limits  COMPREHENSIVE METABOLIC PANEL -  Abnormal; Notable for the following:    CO2 21 (*)    All other components within normal limits  I-STAT CHEM 8, ED - Abnormal; Notable for the following:    Potassium 3.4 (*)    Hemoglobin 15.6 (*)    All other components within normal limits  PROTIME-INR  APTT  I-STAT TROPOININ, ED  CBG MONITORING, ED    Imaging Review Ct Head Wo Contrast  10/01/2015  CLINICAL DATA:  Code stroke.  Left leg numbness.  Slurred speech. EXAM: CT HEAD WITHOUT CONTRAST TECHNIQUE: Contiguous axial images were obtained from the base of the skull through the vertex without intravenous contrast. COMPARISON:  CT head without contrast 03/11/2015. FINDINGS: The basal ganglia are intact. Insular ribbon is normal bilaterally. No focal cortical lesions are present. The ventricles are of normal size. No significant extra-axial fluid collection is present. The paranasal sinuses and mastoid air cells are clear. The calvarium is intact. The extracranial soft tissues are within normal limits. IMPRESSION: Negative CT of the head. These results were called by telephone at the time of interpretation on 10/01/2015 at 11:57 AM to Dr. Amada Jupiter, who verbally acknowledged these results. Electronically Signed   By: Marin Roberts M.D.   On: 10/01/2015 12:07   Mr Brain Ltd W/o Cm  10/01/2015  CLINICAL DATA:  Left-sided weakness.  History of headaches. EXAM: MRI HEAD WITHOUT CONTRAST TECHNIQUE: Multiplanar, multiecho pulse sequences of the brain and surrounding structures were obtained without intravenous contrast. COMPARISON:  CT head 10/01/2015.  MR head 04/07/2013 FINDINGS: Patient vomited during the scan. Axial diffusion-weighted imaging only was obtained which is degraded by motion. The study was terminated and no additional scanning performed Diffusion-weighted imaging reveals no acute infarct. No areas of restricted diffusion. Area of susceptibility in the right occipital lobe corresponds to a chronic area of hemorrhage. This is  noted on the prior MRI. IMPRESSION: Limited exam.  Negative for acute infarct. Electronically Signed   By: Marlan Palau M.D.   On: 10/01/2015 12:29   I have personally reviewed and evaluated these images and lab results as part of my medical decision-making.   EKG Interpretation   Date/Time:  Tuesday October 01 2015 12:14:21 EST Ventricular Rate:  80 PR Interval:  137 QRS Duration: 100 QT Interval:  403 QTC Calculation: 465 R Axis:   17 Text Interpretation:  Sinus rhythm ISOLATED INFERIOR T WAVE. O/W NORMAL  Confirmed by Donnald Garre, MD, Lebron Conners [phone]) on 10/01/2015 1:47:10 PM     Consult: Patient's case has been evaluated by Dr. Amada Jupiter as a code stroke. Reevaluation he determined this is consistent with complex migraine. Patient has had CT head and MRI that do not show acute findings. MDM   Final diagnoses:  Atypical migraine   Patient presents as outlined. She is treated with IM Toradol, Compazine, Benadryl with good result. She has significant relief of pain and symptoms. At this time patient will be provided with prescriptions for Reglan, Benadryl and ibuprofen. She wishes to follow-up with [facility]  again. Discharged structure provided with return precautions and follow-up plan.    Arby BarretteMarcy Amal Saiki, MD 10/01/15 [phone]

## 2015-10-01 NOTE — Discharge Instructions (Signed)

## 2015-10-01 NOTE — ED Notes (Signed)
Pt tremulous, GCS 15, no change to neuro status noted.

## 2015-10-01 NOTE — Code Documentation (Signed)
44yo [facility] at 1126.  Patient was at work when she had a "hot flash" and began to pass out and her coworkers assisted her to a chair.  EMS called and activated a code stroke for left facial droop and left sided weakness.  Patient reporting severe HA and was reportedly vomiting en route. Patient with left leg worsening with no movement en route per EMS.  Stroke team at the bedside on patient arrival.  Labs drawn and patient cleared for CT by Betsy CoderWill Dansie, PA.  Patient to CT with team.  Dr. Amada JupiterKirkpatrick reviewed CT.  MRI called and patient straight to MRI.  Patient unable to tolerate MRI d/t vomiting.  Patient transported back to ED A3.  NIHSS 3->1, see documentation for details and code stroke times.   Patient initially with left leg weakness but this improved.  Patient continuing to report decreased sensation on the left side.  Patient with h/o migraines.  Patient continuing to have vomiting.  Patient is contraindicated for treatment with tPA d/t h/o ICH.  Code stroke canceled per Dr. Amada JupiterKirkpatrick.  Bedside handoff with ED RN Chrislyn.

## 2015-10-01 NOTE — ED Notes (Signed)
Pt arrives from work via [facility].  EMS reports pt had near syncopal hot flash at work, denies fall, denies LOC.  EMS reports pt then had sudden onset headache with L sided weakness.  Pt reports hx CVA.  Pt reports headache 10/10.  AO, tearful upon arrival.  Stroke team present.

## 2015-10-01 NOTE — ED Notes (Signed)
Dr. Kirkpatrick MD at bedside. 

## 2015-10-01 NOTE — Consult Note (Signed)
Neurology Consultation Reason for Consult: left sided weakness Referring Physician: Stanton KidneyPfeiffer, M  CC: Left sided weakness  History is obtained from:patient  HPI: [name] is a 44 y.o. female with a history of previous intracerebral hemorrhage in 2009 which was of undetermined etiology as well as frequent headaches presents with headache that is typical of her headaches, but coupled with weakness that is out of proportion which she typically experiences. She says that she commonly will have left-sided weakness with her headaches but it is much milder than what she is currently experiencing. She does think that she has had 2-3 episodes since her stroke during which time she had left-sided weakness as profound as what she is expressing now on his coupled with migraine.  She was treated here for her ICH which is documented under a separate medical record number.   LKW: 10:30 AM tpa given?: no, previous ICH    ROS: A 14 point ROS was performed and is negative except as noted in the HPI.   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  Psych: Affect appropriate to situation Eyes: No scleral injection HENT: No OP obstrucion Head: Normocephalic.  Cardiovascular: Normal rate and regular rhythm.  Respiratory: Effort normal and breath sounds normal to anterior ascultation GI: Soft.  No distension. There is no tenderness.  Skin: WDI  Neuro: Mental Status: Patient is awake,  alert, oriented to person, place, month, year, and situation. Patient is able to give a clear and coherent history. No signs of aphasia or neglect Cranial Nerves: II: Visual Fields are full. Pupils are equal, round, and reactive to light.   III,IV, VI: EOMI without ptosis or diploplia.  V: Facial sensation is diminished on the left.  VII: Facial movement is symmetric.  VIII: hearing is intact to voice X: Uvula elevates symmetrically XI: Shoulder shrug is symmetric. XII: tongue is midline without atrophy or fasciculations.  Motor: Tone is normal. Bulk is normal. 5/5 strength on the right, 4+/5 in the left arm and 3/5 in the left leg.  Sensory: Sensation is diminished throughout the left side  Cerebellar: FNF  intact bilaterally   I have reviewed labs in epic and the results pertinent to this consultation are: Bmp - unremarkable  I have reviewed the images obtained:CT/MRI no acute findigns  Impression: 44 yo F with previous ICH and intermittent headaches with left sided weakness. With negative DWI and history of similar symptoms, I feel that complicated migraine is the likley diagnosis and would treat it as such.   Recommendations: 1) Compazine/benadryl/toradol.  2) I recommended MRI which has been done at the time of this note being finalized.  3) No further recommendations at this time.    Ritta SlotMcNeill Ovide Dusek, MD [facility] [phone]  If 7pm- 7am, please page neurology on call as listed in AMION.

## 2015-10-07 ENCOUNTER — Encounter: Payer: Self-pay | Admitting: Neurology

## 2015-10-07 ENCOUNTER — Ambulatory Visit (INDEPENDENT_AMBULATORY_CARE_PROVIDER_SITE_OTHER): Payer: Managed Care, Other (non HMO) | Admitting: Neurology

## 2015-10-07 VITALS — BP 126/79 | HR 91 | Ht 64.5 in | Wt 189.5 lb

## 2015-10-07 DIAGNOSIS — G43409 Hemiplegic migraine, not intractable, without status migrainosus: Secondary | ICD-10-CM

## 2015-10-07 DIAGNOSIS — R569 Unspecified convulsions: Secondary | ICD-10-CM | POA: Diagnosis not present

## 2015-10-07 DIAGNOSIS — G43419 Hemiplegic migraine, intractable, without status migrainosus: Secondary | ICD-10-CM | POA: Diagnosis not present

## 2015-10-07 HISTORY — DX: Hemiplegic migraine, not intractable, without status migrainosus: G43.409

## 2015-10-07 MED ORDER — TOPIRAMATE 25 MG PO TABS: ORAL_TABLET | ORAL | Status: DC

## 2015-10-07 NOTE — Patient Instructions (Signed)
   Stop the Lyrica, we will start Topamax, and get an EEG study.  Migraine Headache A migraine headache is an intense, throbbing pain on one or both sides of your head. A migraine can last for 30 minutes to several hours. CAUSES  The exact cause of a migraine headache is not always known. However, a migraine may be caused when nerves in the brain become irritated and release chemicals that cause inflammation. This causes pain. Certain things may also trigger migraines, such as:  Alcohol.  Smoking.  Stress.  Menstruation.  Aged cheeses.  Foods or drinks that contain nitrates, glutamate, aspartame, or tyramine.  Lack of sleep.  Chocolate.  Caffeine.  Hunger.  Physical exertion.  Fatigue.  Medicines used to treat chest pain (nitroglycerine), birth control pills, estrogen, and some blood pressure medicines. SIGNS AND SYMPTOMS  Pain on one or both sides of your head.  Pulsating or throbbing pain.  Severe pain that prevents daily activities.  Pain that is aggravated by any physical activity.  Nausea, vomiting, or both.  Dizziness.  Pain with exposure to bright lights, loud noises, or activity.  General sensitivity to bright lights, loud noises, or smells. Before you get a migraine, you may get warning signs that a migraine is coming (aura). An aura may include:  Seeing flashing lights.  Seeing bright spots, halos, or zigzag lines.  Having tunnel vision or blurred vision.  Having feelings of numbness or tingling.  Having trouble talking.  Having muscle weakness. DIAGNOSIS  A migraine headache is often diagnosed based on:  Symptoms.  Physical exam.  A CT scan or MRI of your head. These imaging tests cannot diagnose migraines, but they can help rule out other causes of headaches. TREATMENT Medicines may be given for pain and nausea. Medicines can also be given to help prevent recurrent migraines.  HOME CARE INSTRUCTIONS  Only take over-the-counter or  prescription medicines for pain or discomfort as directed by your health care provider. The use of long-term narcotics is not recommended.  Lie down in a dark, quiet room when you have a migraine.  Keep a journal to find out what may trigger your migraine headaches. For example, write down:  What you eat and drink.  How much sleep you get.  Any change to your diet or medicines.  Limit alcohol consumption.  Quit smoking if you smoke.  Get 7-9 hours of sleep, or as recommended by your health care provider.  Limit stress.  Keep lights dim if bright lights bother you and make your migraines worse. SEEK IMMEDIATE MEDICAL CARE IF:   Your migraine becomes severe.  You have a fever.  You have a stiff neck.  You have vision loss.  You have muscular weakness or loss of muscle control.  You start losing your balance or have trouble walking.  You feel faint or pass out.  You have severe symptoms that are different from your first symptoms. MAKE SURE YOU:   Understand these instructions.  Will watch your condition.  Will get help right away if you are not doing well or get worse.   This information is not intended to replace advice given to you by your health care provider. Make sure you discuss any questions you have with your health care provider.   Document Released: 10/12/2005 Document Revised: 11/02/2014 Document Reviewed: 06/19/2013 Elsevier Interactive Patient Education [employer]2016 Elsevier Inc.

## 2015-10-07 NOTE — Progress Notes (Signed)
Reason for visit: Headache  Referring physician: Dr. Francee GentileEason  Devynn S Uptain is a 44 y.o. female  History of present illness:  Ms. Alva Garnetyner is a 44 year old black female with a history of migraine headaches since she was 44 years old. The patient indicates that over the last several months, her headaches have become more frequent in nature. The patient is having 3 or 4 headaches a week, the headaches may last several hours. The headaches tend to start in the back of the head, and then go forward. The patient reports some photophobia and phonophobia with the headache, and some nausea and vomiting. She has some residual left-sided numbness following a right parietal intracranial hemorrhage that occurred in 2009. The numbness on the left side increases during the headaches. The patient believes that there may be some weakness as well. She does have some visual changes with some blurring of vision, and spots in front of the eyes. She reports occasional double vision. She has taken Advil without much benefit. She is on low-dose Lyrica 25 mg at night and gabapentin 300 mg at night. The patient has been followed by Dr. Malvin JohnsPotter, [place] neurolog[place]. She comes to this office for an evaluation. She reports a history of seizures that in the past was worsened with the use of Ultram. The last seizure was 4 months ago. The patient may have generalized jerking, loss of consciousness, urinary incontinence, and occasional tongue biting. The patient had a blackout episode within the last week when she started feeling hot, and then had a syncopal event. 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 The intracranial hemorrhage source in 2009 was never determined, cerebral angiogram did not show an AVM or an aneurysm. The patient has had a recent MRI of the brain that showed no acute changes and she went to the emergency room on 10/01/2015.  Past Medical History    Diagnosis Date  . Seizures (HCC)   . Headache   . Hemiplegic migraine 10/07/2015    Past Surgical History  Procedure Laterality Date  . Abdominal hysterectomy      Family History  Problem Relation Age of Onset  . Stroke Mother   . Hypertension Father   . Diabetes Father   . Hypertension Paternal Aunt   . Diabetes Paternal Aunt   . Stroke Paternal Aunt   . Hypertension Paternal Uncle   . Diabetes Paternal Uncle   . Stroke Paternal Uncle   . Hypertension Maternal Grandmother   . Aneurysm Paternal Grandmother   . Diabetes Paternal Grandmother   . Hypertension Paternal Grandfather   . Cancer Paternal Grandfather   . Seizures Cousin   . Migraines Sister   . Migraines Brother   . Migraines Sister   . Migraines Sister   . Migraines Sister   . Migraines Sister   . Migraines Sister     Social history:  reports that she has been smoking Cigarettes.  She has been smoking about 1.50 packs per day. She has never used smokeless tobacco. She reports that she drinks alcohol. She reports that she does not use illicit drugs.  Medications:  Prior to Admission medications   Medication Sig Start Date End Date Taking? Authorizing Provider  diclofenac (VOLTAREN) 25 MG EC tablet Take 25 mg by mouth 2 (two) times daily.   Yes Historical Provider, MD  diphenhydrAMINE (BENADRYL) 25 MG tablet Take 1 tablet (25 mg total) by mouth every  6 (six) hours. 10/01/15  Yes Arby Barrette, MD  gabapentin (NEURONTIN) 300 MG capsule Take 300 mg by mouth daily.   Yes Historical Provider, MD  ibuprofen (ADVIL,MOTRIN) 800 MG tablet Take 1 tablet (800 mg total) by mouth 3 (three) times daily. 10/01/15  Yes Arby Barrette, MD  metoCLOPramide (REGLAN) 10 MG tablet Take 1 tablet (10 mg total) by mouth every 6 (six) hours as needed for nausea (nausea/headache). 10/01/15  Yes Arby Barrette, MD  ondansetron (ZOFRAN ODT) 4 MG disintegrating tablet  ODT q4 hours prn nausea/vomit 10/01/15  Yes Arby Barrette, MD   pregabalin (LYRICA) 25 MG capsule Take 25 mg by mouth daily.   Yes Historical Provider, MD      Allergies  Allergen Reactions  . Amoxicillin Hives  . Morphine And Related Hives  . Morphine And Related   . Penicillins Hives  . Penicillins   . Shellfish Allergy Hives    ROS:  Out of a complete 14 system review of symptoms, the patient complains only of the following symptoms, and all other reviewed systems are negative.  Ringing in the ears, difficulty swallowing Blurred vision Shortness of breath, cough Increased thirst Joint pain, muscle cramps, aching muscles Confusion, headache, numbness, weakness, difficulty swallowing, passing out Enough sleep, change in appetite Insomnia, restless legs  Blood pressure 126/79, pulse 91, height 5' 4.5" (1.638 m), weight 189 lb 8 oz (85.957 kg).  Physical Exam  General: The patient is alert and cooperative at the time of the examination. The patient is minimally obese.  Eyes: Pupils are equal, round, and reactive to light. Discs are flat bilaterally.  Neck: The neck is supple, no carotid bruits are noted.  Respiratory: The respiratory examination is clear.  Cardiovascular: The cardiovascular examination reveals a regular rate and rhythm, no obvious murmurs or rubs are noted.  Skin: Extremities are without significant edema.  Neurologic Exam  Mental status: The patient is alert and oriented x 3 at the time of the examination. The patient has apparent normal recent and remote memory, with an apparently normal attention span and concentration ability.  Cranial nerves: Facial symmetry is present. There is good sensation of the face to pinprick and soft touch on the right, decreased on the left. The patient does not split the midline with vibration sensation on the forehead. The strength of the facial muscles and the muscles to head turning and shoulder shrug are normal bilaterally. Speech is well enunciated, no aphasia or dysarthria is  noted. Extraocular movements are full. Visual fields are full. The tongue is midline, and the patient has symmetric elevation of the soft palate. No obvious hearing deficits are noted.  Motor: The motor testing reveals 5 over 5 strength of all 4 extremities. Good symmetric motor tone is noted throughout.  Sensory: Sensory testing is intact to pinprick, soft touch, vibration sensation, and position sense on all 4 extremities, with exception of some decrease in pinprick sedation on the left arm and leg, some decrease in position sense on the left foot. Vibration sensation is decreased on the left hand. No evidence of extinction is noted.  Coordination: Cerebellar testing reveals good finger-nose-finger and heel-to-shin bilaterally.  Gait and station: Gait is normal. Tandem gait is normal. Romberg is negative. No drift is seen.  Reflexes: Deep tendon reflexes are symmetric and normal bilaterally. Toes are downgoing bilaterally.   Assessment/Plan:  1. Migraine headache  2. Right parietal intracranial hemorrhage  3. 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 I have indicated that she is not to operate a motor vehicle for least 6 months after her last seizure. The patient is on very low-dose gabapentin and Lyrica, she is not being adequately treated for either the headache or for the seizures. The patient will stop the Lyrica, start Topamax building up to a 75 mg daily dose, she will contact our office if she is tolerating the medication, but is still having a lot of headache, and the dose will continue to be increased. The patient follow-up in 3 months. The patient will undergo an EEG study. Given the focal features, and history of stroke, the patient cannot use triptan medications.   Marlan Palau MD 10/07/2015 7:23 PM  [facility] [address] [place], [place]  [address]  Phone [phone] Fax [phone]

## 2015-10-23 ENCOUNTER — Emergency Department
Admission: EM | Admit: 2015-10-23 | Discharge: 2015-10-23 | Disposition: A | Discharge status: Home / Self Care | Payer: Managed Care, Other (non HMO) | Attending: Emergency Medicine | Admitting: Emergency Medicine

## 2015-10-23 ENCOUNTER — Encounter: Payer: Self-pay | Admitting: Medical Oncology

## 2015-10-23 DIAGNOSIS — Z880 Allergy status to penicillin: Secondary | ICD-10-CM | POA: Insufficient documentation

## 2015-10-23 DIAGNOSIS — G43809 Other migraine, not intractable, without status migrainosus: Secondary | ICD-10-CM | POA: Diagnosis not present

## 2015-10-23 DIAGNOSIS — F17210 Nicotine dependence, cigarettes, uncomplicated: Secondary | ICD-10-CM | POA: Diagnosis not present

## 2015-10-23 DIAGNOSIS — R197 Diarrhea, unspecified: Secondary | ICD-10-CM | POA: Diagnosis not present

## 2015-10-23 DIAGNOSIS — Z79899 Other long term (current) drug therapy: Secondary | ICD-10-CM | POA: Insufficient documentation

## 2015-10-23 DIAGNOSIS — R51 Headache: Secondary | ICD-10-CM | POA: Diagnosis present

## 2015-10-23 DIAGNOSIS — Z791 Long term (current) use of non-steroidal anti-inflammatories (NSAID): Secondary | ICD-10-CM | POA: Diagnosis not present

## 2015-10-23 MED ORDER — SODIUM CHLORIDE 0.9 % IV BOLUS (SEPSIS)
1000.0000 mL | Freq: Once | INTRAVENOUS | Status: AC
  Administered 2015-10-23: 1000 mL via INTRAVENOUS

## 2015-10-23 MED ORDER — PROMETHAZINE HCL 12.5 MG PO TABS: 12.5000 mg | ORAL_TABLET | Freq: Four times a day (QID) | ORAL | Status: DC | PRN

## 2015-10-23 MED ORDER — PROCHLORPERAZINE EDISYLATE 5 MG/ML IJ SOLN
10.0000 mg | Freq: Four times a day (QID) | INTRAMUSCULAR | Status: DC | PRN
  Administered 2015-10-23: 10 mg via INTRAVENOUS
  Filled 2015-10-23: qty 2

## 2015-10-23 MED ORDER — KETOROLAC TROMETHAMINE 30 MG/ML IJ SOLN
30.0000 mg | Freq: Once | INTRAMUSCULAR | Status: DC
  Filled 2015-10-23: qty 1

## 2015-10-23 NOTE — ED Notes (Signed)
MD notified that pt refused toradol. MD also notified that pt states "if I can't get it to go away, then I will go home and tough it out".

## 2015-10-23 NOTE — ED Provider Notes (Signed)
[facility] Emergency Department Provider Note   ____________________________________________  Time seen: 1420  I have reviewed the triage vital signs and the nursing notes.   HISTORY  Chief Complaint Emesis and Headache   History limited by: Not Limited   HPI Hannah CaoKatina S Collins is a 44 y.o. female who presents to the emergency department today because of concerns for headache. Patient states she has a history of migraines. She states this migraine started roughly 3 days ago. She states it is progressively gotten worse. She states it is worse in the back part of her head and the pain radiates down to her neck. It is severe. She has had headaches frequently in the past was last seen earlier this month for migraine. She denies any significant trauma to her head. Denies any fevers. She has had some nausea and vomiting recently which he thinks she picked up at her work.   Past Medical History  Diagnosis Date  . Seizures (HCC)   . Headache   . Hemiplegic migraine 10/07/2015    Patient Active Problem List   Diagnosis Date Noted  . Seizures (HCC) 10/07/2015  . Hemiplegic migraine 10/07/2015    Past Surgical History  Procedure Laterality Date  . Abdominal hysterectomy      Current Outpatient Rx  Name  Route  Sig  Dispense  Refill  . diclofenac (VOLTAREN) 25 MG EC tablet   Oral   Take 25 mg by mouth 2 (two) times daily.         . diphenhydrAMINE (BENADRYL) 25 MG tablet   Oral   Take 1 tablet (25 mg total) by mouth every 6 (six) hours.   20 tablet   0   . gabapentin (NEURONTIN) 300 MG capsule   Oral   Take 300 mg by mouth daily.         Marland Kitchen. ibuprofen (ADVIL,MOTRIN) 800 MG tablet   Oral   Take 1 tablet (800 mg total) by mouth 3 (three) times daily.   21 tablet   0   . metoCLOPramide (REGLAN) 10 MG tablet   Oral   Take 1 tablet (10 mg total) by mouth every 6 (six) hours as needed for nausea (nausea/headache).   6 tablet   0   . ondansetron  (ZOFRAN ODT) 4 MG disintegrating tablet      4mg  ODT q4 hours prn nausea/vomit   20 tablet   0   . topiramate (TOPAMAX) 25 MG tablet      Take one tablet at night for one week, then take 2 tablets at night for one week, then take 3 tablets at night.   90 tablet   1     Allergies Amoxicillin; Morphine and related; Morphine and related; Penicillins; Penicillins; and Shellfish allergy  Family History  Problem Relation Age of Onset  . Stroke Mother   . Hypertension Father   . Diabetes Father   . Hypertension Paternal Aunt   . Diabetes Paternal Aunt   . Stroke Paternal Aunt   . Hypertension Paternal Uncle   . Diabetes Paternal Uncle   . Stroke Paternal Uncle   . Hypertension Maternal Grandmother   . Aneurysm Paternal Grandmother   . Diabetes Paternal Grandmother   . Hypertension Paternal Grandfather   . Cancer Paternal Grandfather   . Seizures Cousin   . Migraines Sister   . Migraines Brother   . Migraines Sister   . Migraines Sister   . Migraines Sister   . Migraines Sister   .  Migraines Sister     Social History Social History  Substance Use Topics  . Smoking status: Current Every Day Smoker -- 1.50 packs/day    Types: Cigarettes  . Smokeless tobacco: Never Used  . Alcohol Use: 0.0 oz/week    0 Standard drinks or equivalent per week     Comment: occasionally    Review of Systems  Constitutional: Negative for fever. Cardiovascular: Negative for chest pain. Respiratory: Negative for shortness of breath. Gastrointestinal: Positive for vomiting and diarrhea. Neurological: Positive for headache.  10-point ROS otherwise negative.  ____________________________________________   PHYSICAL EXAM:  VITAL SIGNS: ED Triage Vitals  Enc Vitals Group     BP 10/23/15 1041 145/76 mmHg     Pulse Rate 10/23/15 1041 92     Resp 10/23/15 1041 20     Temp 10/23/15 1041 97.5 F (36.4 C)     Temp Source 10/23/15 1041 Oral     SpO2 10/23/15 1041 98 %     Weight  10/23/15 1041 189 lb (85.73 kg)     Height 10/23/15 1041  (1.626 m)     Head Cir --      Peak Flow --      Pain Score 10/23/15 1042 10   Constitutional: Alert and oriented. Well appearing and in no distress. Eyes: Conjunctivae are normal. PERRL. Normal extraocular movements. ENT   Head: Normocephalic and atraumatic.   Nose: No congestion/rhinnorhea.   Mouth/Throat: Mucous membranes are moist.   Neck: No stridor. Hematological/Lymphatic/Immunilogical: No cervical lymphadenopathy. Cardiovascular: Normal rate, regular rhythm.  No murmurs, rubs, or gallops. Respiratory: Normal respiratory effort without tachypnea nor retractions. Breath sounds are clear and equal bilaterally. No wheezes/rales/rhonchi. Gastrointestinal: Soft and nontender. No distention.  Genitourinary: Deferred Musculoskeletal: Normal range of motion in all extremities. No joint effusions.  No lower extremity tenderness nor edema. Neurologic:  Normal speech and language. No gross focal neurologic deficits are appreciated.  Skin:  Skin is warm, dry and intact. No rash noted. Psychiatric: Mood and affect are normal. Speech and behavior are normal. Patient exhibits appropriate insight and judgment.  ____________________________________________    LABS (pertinent positives/negatives)  None  ____________________________________________   EKG  None  ____________________________________________    RADIOLOGY  None   ____________________________________________   PROCEDURES  Procedure(s) performed: None  Critical Care performed: No  ____________________________________________   INITIAL IMPRESSION / ASSESSMENT AND PLAN / ED COURSE  Pertinent labs & imaging results that were available during my care of the patient were reviewed by me and considered in my medical decision making (see chart for details).  Patient presented to the emergency department today because of headache typical of  her migraines. Patient was given fluids and medication here. Patient felt some improvement but she wanted to further treat her migraine at home. Encourage follow-up with her neurologist.  ____________________________________________   FINAL CLINICAL IMPRESSION(S) / ED DIAGNOSES  Final diagnoses:  Other type of migraine     Phineas Semen, MD 10/23/15 1606

## 2015-10-23 NOTE — ED Notes (Signed)
Pt has reports of migraine headache that began 2 days ago, reports nausea and vomiting also.

## 2015-10-23 NOTE — ED Notes (Signed)
Pt alert and oriented X4, active, cooperative, pt in NAD. RR even and unlabored, color WNL.  Pt informed to return if any life threatening symptoms occur.   

## 2015-10-23 NOTE — Discharge Instructions (Signed)
Please seek medical attention for any high fevers, chest pain, shortness of breath, change in behavior, persistent vomiting, bloody stool or any other new or concerning symptoms. ° ° °Migraine Headache °A migraine headache is an intense, throbbing pain on one or both sides of your head. A migraine can last for 30 minutes to several hours. °CAUSES  °The exact cause of a migraine headache is not always known. However, a migraine may be caused when nerves in the brain become irritated and release chemicals that cause inflammation. This causes pain. °Certain things may also trigger migraines, such as: °· Alcohol. °· Smoking. °· Stress. °· Menstruation. °· Aged cheeses. °· Foods or drinks that contain nitrates, glutamate, aspartame, or tyramine. °· Lack of sleep. °· Chocolate. °· Caffeine. °· Hunger. °· Physical exertion. °· Fatigue. °· Medicines used to treat chest pain (nitroglycerine), birth control pills, estrogen, and some blood pressure medicines. °SIGNS AND SYMPTOMS °· Pain on one or both sides of your head. °· Pulsating or throbbing pain. °· Severe pain that prevents daily activities. °· Pain that is aggravated by any physical activity. °· Nausea, vomiting, or both. °· Dizziness. °· Pain with exposure to bright lights, loud noises, or activity. °· General sensitivity to bright lights, loud noises, or smells. °Before you get a migraine, you may get warning signs that a migraine is coming (aura). An aura may include: °· Seeing flashing lights. °· Seeing bright spots, halos, or zigzag lines. °· Having tunnel vision or blurred vision. °· Having feelings of numbness or tingling. °· Having trouble talking. °· Having muscle weakness. °DIAGNOSIS  °A migraine headache is often diagnosed based on: °· Symptoms. °· Physical exam. °· A CT scan or MRI of your head. These imaging tests cannot diagnose migraines, but they can help rule out other causes of headaches. °TREATMENT °Medicines may be given for pain and nausea.  Medicines can also be given to help prevent recurrent migraines.  °HOME CARE INSTRUCTIONS °· Only take over-the-counter or prescription medicines for pain or discomfort as directed by your health care provider. The use of long-term narcotics is not recommended. °· Lie down in a dark, quiet room when you have a migraine. °· Keep a journal to find out what may trigger your migraine headaches. For example, write down: °¨ What you eat and drink. °¨ How much sleep you get. °¨ Any change to your diet or medicines. °· Limit alcohol consumption. °· Quit smoking if you smoke. °· Get 7-9 hours of sleep, or as recommended by your health care provider. °· Limit stress. °· Keep lights dim if bright lights bother you and make your migraines worse. °SEEK IMMEDIATE MEDICAL CARE IF:  °· Your migraine becomes severe. °· You have a fever. °· You have a stiff neck. °· You have vision loss. °· You have muscular weakness or loss of muscle control. °· You start losing your balance or have trouble walking. °· You feel faint or pass out. °· You have severe symptoms that are different from your first symptoms. °MAKE SURE YOU:  °· Understand these instructions. °· Will watch your condition. °· Will get help right away if you are not doing well or get worse. °  °This information is not intended to replace advice given to you by your health care provider. Make sure you discuss any questions you have with your health care provider. °  °Document Released: 10/12/2005 Document Revised: 11/02/2014 Document Reviewed: 06/19/2013 °Elsevier Interactive Patient Education ©2016 [employer] ° °

## 2015-10-23 NOTE — ED Notes (Signed)
Pt reports that she has body aches and n/v since yesterday. States she was sent home from work because she was so sick. Pt states she cannot keep down any food or liquids and that her head aches, as well as her body.

## 2015-10-23 NOTE — ED Notes (Signed)
Report from Katie, RN

## 2015-10-24 ENCOUNTER — Telehealth: Payer: Self-pay | Admitting: Neurology

## 2015-10-24 NOTE — Telephone Encounter (Signed)
I called back and spoke with the patient.  Says she has been experiencing nausea since she started Topamax, however, upon increasing the dose she has developed severe vomiting.  States she was seen at ED yesterday and treated for dehydration.  She is requesting an alternate medication.  Dr Anne HahnWillis is out of the office.  [facility] for [facility].  Thank you!

## 2015-10-24 NOTE — Telephone Encounter (Signed)
Please ask her to reduce to the dose of Topamax she was able to tolerate without N/V. Alternative medication is best discussed with Dr. Anne HahnWillis directly next week.  Please remind her to stay well hydrated.

## 2015-10-24 NOTE — Telephone Encounter (Signed)
I called the patient back.  Relayed Dr Teofilo PodAthar's note.  She expressed understanding and appreciation.

## 2015-10-24 NOTE — Telephone Encounter (Signed)
Patient is calling. She has been taking topiramate (TOPAMAX) 25 MG tablet for a headache since 10-07-15 increasing the dosage to 3 tablets @ night and it is making her stomach irritated and she throws up everything she eats. She states she is still having bad headaches. Is there something else she can take? She [facility] [place]. Thank you.

## 2015-10-27 MED ORDER — ZONISAMIDE 25 MG PO CAPS: ORAL_CAPSULE | ORAL | Status: DC

## 2015-10-27 NOTE — Telephone Encounter (Signed)
I called patient. The patient was not able to tolerate 75 mg of Topamax daily. She is now back down to 25 mg a day. I will stop the medication, start Zonegran taking 25 mg twice daily for 2 weeks, then 50 mg twice daily. If she can tolerate the drug, we may gradually continue to increase the dosing.

## 2015-11-01 ENCOUNTER — Ambulatory Visit (INDEPENDENT_AMBULATORY_CARE_PROVIDER_SITE_OTHER): Payer: Managed Care, Other (non HMO) | Admitting: Neurology

## 2015-11-01 ENCOUNTER — Telehealth: Payer: Self-pay | Admitting: Neurology

## 2015-11-01 ENCOUNTER — Encounter (INDEPENDENT_AMBULATORY_CARE_PROVIDER_SITE_OTHER): Payer: Self-pay

## 2015-11-01 DIAGNOSIS — G43419 Hemiplegic migraine, intractable, without status migrainosus: Secondary | ICD-10-CM

## 2015-11-01 DIAGNOSIS — R569 Unspecified convulsions: Secondary | ICD-10-CM | POA: Diagnosis not present

## 2015-11-01 MED ORDER — ZONISAMIDE 50 MG PO CAPS: ORAL_CAPSULE | ORAL | Status: DC

## 2015-11-01 NOTE — Procedures (Signed)
    History:  [name] is a 45 year old patient with a history of migraine headaches and a prior history of a right parietal intracranial hemorrhage. The patient has had seizure events associated with generalized jerking and tongue biting. The patient is being evaluated for the seizure episodes.  This is a routine EEG. No skull defects are noted. Medications include diclofenac, gabapentin, Reglan, Zofran, Phenergan, and Zonegran.   EEG classification: Normal awake and drowsy  Description of the recording: The background rhythms of this recording consists of a fairly well modulated medium amplitude alpha rhythm of 10 Hz that is reactive to eye opening and closure. As the record progresses, the patient appears to remain in the waking state throughout the recording. Photic stimulation was performed, resulting in a bilateral and symmetric photic driving response. Hyperventilation was also performed, resulting in a minimal buildup of the background rhythm activities without significant slowing seen. Toward the end of the recording, the patient enters the drowsy state with slight symmetric slowing seen. The patient never enters stage II sleep. At no time during the recording does there appear to be evidence of spike or spike wave discharges or evidence of focal slowing. EKG monitor shows no evidence of cardiac rhythm abnormalities with a heart rate of 72.  Impression: This is a normal EEG recording in the waking and drowsy state. No evidence of ictal or interictal discharges are seen.

## 2015-11-01 NOTE — Telephone Encounter (Signed)
I called the patient. The EEG study was unremarkable. The patient is on Zonegran, tolerating this fairly well, but the medication makes her very tired. Hopefully she will get acclimated to this. We will try to work up to a dose of 50 mg in the morning and 100 mg in the evening.

## 2016-01-07 ENCOUNTER — Encounter: Payer: Self-pay | Admitting: Adult Health

## 2016-01-07 ENCOUNTER — Ambulatory Visit (INDEPENDENT_AMBULATORY_CARE_PROVIDER_SITE_OTHER): Payer: Managed Care, Other (non HMO) | Admitting: Adult Health

## 2016-01-07 VITALS — BP 122/78 | HR 86 | Resp 14 | Ht 64.0 in | Wt 185.0 lb

## 2016-01-07 DIAGNOSIS — R569 Unspecified convulsions: Secondary | ICD-10-CM | POA: Diagnosis not present

## 2016-01-07 DIAGNOSIS — G43019 Migraine without aura, intractable, without status migrainosus: Secondary | ICD-10-CM

## 2016-01-07 MED ORDER — ZONISAMIDE 50 MG PO CAPS: ORAL_CAPSULE | ORAL | Status: DC

## 2016-01-07 NOTE — Patient Instructions (Signed)
Increase Zonegran to 50 mg in the morning and 150 mg in the evening Continue gabapentin If your symptoms worsen or you develop new symptoms please let us know.

## 2016-01-07 NOTE — Progress Notes (Signed)
PATIENT: [name] DOB: 02-Dec-1970  REASON FOR VISIT: follow up- headache , seizure HISTORY FROM: patient  HISTORY OF PRESENT ILLNESS: [name] is a 45 year old with a migraine headaches and seizures. She returns today for follow-up. She is currently on Zonegran 50 mg twice a day. She also takes gabapentin 300 mg daily. She states that since she started Zonegran her severity of her headaches have improved however she continues to have daily headaches. The headaches normally occur on the left side of the neck and the back of the head. Her headaches are now a 7 out of 10 on the pain scale. She will occasionally have intermittent numbness on the left side of the body. This does not always occur with a headache. However it's been present since she had the intracranial hemorrhage. The patient does confirm nausea and vomiting as well as photophobia and phonophobia with her headaches. The patient states that since she started zonegran it has seemed to help her seizures. She states occasionally at night she will have twitching in the left side of the face and will wake up and notice that she's bitten her cheek or tongue. Occasionally she'll have this twitching during the day but denies any loss of consciousness. She does state that she's been having episodes where she loses her memory. She states that she can be doing something and all of a sudden she can't remember what happened. These episodes only last for seconds. Her husband states that when he has witnessed it it's as if she is staring off into space. She is not operating a motor vehicle at this time. She returns today for an evaluation.  HISTORY 10/07/15 (WILLIS): [name] is a 45 year old black female with a history of migraine headaches since she was 45 years old. The patient indicates that over the last several months, her headaches have become more frequent in nature. The patient is having 3 or 4 headaches a week, the headaches may last  several hours. The headaches tend to start in the back of the head, and then go forward. The patient reports some photophobia and phonophobia with the headache, and some nausea and vomiting. She has some residual left-sided numbness following a right parietal intracranial hemorrhage that occurred in 2009. The numbness on the left side increases during the headaches. The patient believes that there may be some weakness as well. She does have some visual changes with some blurring of vision, and spots in front of the eyes. She reports occasional double vision. She has taken Advil without much benefit. She is on low-dose Lyrica 25 mg at night and gabapentin 300 mg at night. The patient has been followed by Dr. Malvin JohnsPotter, a neurolog[place]. She comes to this office for an evaluation. She reports a history of seizures that in the past was worsened with the use of Ultram. The last seizure was 4 months ago. The patient may have generalized jerking, loss of consciousness, urinary incontinence, and occasional tongue biting. The patient had a blackout episode within the last week when she started feeling hot, and then had a syncopal event. 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 The intracranial hemorrhage source in 2009 was never determined, cerebral angiogram did not show an AVM or an aneurysm. The patient has had a recent MRI of the brain that showed no acute changes and she went to the emergency room on 10/01/2015.  REVIEW OF SYSTEMS:  Out of a complete 14 system review of symptoms, the patient complains only of the following symptoms, and all other reviewed systems are negative.  Memory loss, headache, numbness, weakness, confusion, decreased concentration, back pain, aching muscles, walking difficulty, neck pain, neck stiffness, frequency of urination, excessive thirst, restless leg, insomnia, frequent waking, shortness of breath, blurred vision, light  sensitivity, appetite change, ringing in ears, trouble swallowing  ALLERGIES: Allergies  Allergen Reactions  . Amoxicillin Hives  . Morphine And Related Hives  . Morphine And Related   . Penicillins Hives  . Penicillins   . Shellfish Allergy Hives  . Topamax [Topiramate]     Nausea and vomiting    HOME MEDICATIONS: Outpatient Prescriptions Prior to Visit  Medication Sig Dispense Refill  . gabapentin (NEURONTIN) 300 MG capsule Take 300 mg by mouth daily.    Marland Kitchen zonisamide (ZONEGRAN) 50 MG capsule 1 tablet in the morning, 2 in the evening 90 capsule 2  . diclofenac (VOLTAREN) 25 MG EC tablet Take 25 mg by mouth 2 (two) times daily. Reported on 01/07/2016    . diphenhydrAMINE (BENADRYL) 25 MG tablet Take 1 tablet (25 mg total) by mouth every 6 (six) hours. (Patient not taking: Reported on 01/07/2016) 20 tablet 0  . ibuprofen (ADVIL,MOTRIN) 800 MG tablet Take 1 tablet (800 mg total) by mouth 3 (three) times daily. (Patient not taking: Reported on 01/07/2016) 21 tablet 0  . metoCLOPramide (REGLAN) 10 MG tablet Take 1 tablet (10 mg total) by mouth every 6 (six) hours as needed for nausea (nausea/headache). (Patient not taking: Reported on 01/07/2016) 6 tablet 0  . ondansetron (ZOFRAN ODT) 4 MG disintegrating tablet  ODT q4 hours prn nausea/vomit (Patient not taking: Reported on 01/07/2016) 20 tablet 0  . promethazine (PHENERGAN) 12.5 MG tablet Take 1 tablet (12.5 mg total) by mouth every 6 (six) hours as needed for nausea or vomiting. (Patient not taking: Reported on 01/07/2016) 30 tablet 0   No facility-administered medications prior to visit.    PAST MEDICAL HISTORY: Past Medical History  Diagnosis Date  . Seizures (HCC)   . Headache   . Hemiplegic migraine 10/07/2015    PAST SURGICAL HISTORY: Past Surgical History  Procedure Laterality Date  . Abdominal hysterectomy      FAMILY HISTORY: Family History  Problem Relation Age of Onset  . Stroke Mother   . Hypertension Father   .  Diabetes Father   . Hypertension Paternal Aunt   . Diabetes Paternal Aunt   . Stroke Paternal Aunt   . Hypertension Paternal Uncle   . Diabetes Paternal Uncle   . Stroke Paternal Uncle   . Hypertension Maternal Grandmother   . Aneurysm Paternal Grandmother   . Diabetes Paternal Grandmother   . Hypertension Paternal Grandfather   . Cancer Paternal Grandfather   . Seizures Cousin   . Migraines Sister   . Migraines Brother   . Migraines Sister   . Migraines Sister   . Migraines Sister   . Migraines Sister   . Migraines Sister     SOCIAL HISTORY: Social History   Social History  . Marital Status: Married    Spouse Name: N/A  . Number of Children: 3  . Years of Education: 11   Occupational History  . [employer]    Social History Main Topics  . Smoking status: Current Every Day Smoker -- 1.50 packs/day    Types: Cigarettes  . Smokeless tobacco: Never Used  . Alcohol Use: 0.0 oz/week  0 Standard drinks or equivalent per week     Comment: occasionally  . Drug Use: No  . Sexual Activity: Not on file   Other Topics Concern  . Not on file   Social History Narrative   Patient drink about 2 cups of caffeine daily.   Patient is right handed.       PHYSICAL EXAM  Filed Vitals:   01/07/16 0914  BP: 122/78  Pulse: 86  Resp: 14  Height: 5\' 4"  (1.626 m)  Weight: 185 lb (83.915 kg)   Body mass index is 31.74 kg/(m^2).  Generalized: Well developed, in no acute distress   Neurological examination  Mentation: Alert oriented to time, place, history taking. Follows all commands speech and language fluent Cranial nerve II-XII: Pupils were equal round reactive to light. Extraocular movements were full, visual field were full on confrontational test. Facial sensation and strength were normal. Uvula tongue midline. Head turning and shoulder shrug  were normal and symmetric. Motor: The motor testing reveals 5 over 5 strength In the right upper and lower extremity.  4/5 strength in the left upper extremity and left lower extremity.Peri Jefferson symmetric motor tone is noted throughout.  Sensory: Sensory testing is intact to soft touch on all 4 extremities except decreased in the left arm.. No evidence of extinction is noted.  Coordination: Cerebellar testing reveals good finger-nose-finger and heel-to-shin bilaterally.  Gait and station: Gait is normal. Tandem gait is normal. Romberg is negative. No drift is seen.  Reflexes: Deep tendon reflexes are symmetric and normal bilaterally.   DIAGNOSTIC DATA (LABS, IMAGING, TESTING) - I reviewed patient records, labs, notes, testing and imaging myself where available.  Lab Results  Component Value Date   WBC 15.7* 10/01/2015   HGB 15.6* 10/01/2015   HCT 46.0 10/01/2015   MCV 82.6 10/01/2015   PLT 300 10/01/2015      Component Value Date/Time   NA 145 10/01/2015 1138   NA 145 08/29/2014 1556   K 3.4* 10/01/2015 1138   K 4.0 08/29/2014 1556   CL 105 10/01/2015 1138   CL 113* 08/29/2014 1556   CO2 21* 10/01/2015 1133   CO2 24 08/29/2014 1556   GLUCOSE 81 10/01/2015 1138   GLUCOSE 87 08/29/2014 1556   BUN 8 10/01/2015 1138   BUN 9 08/29/2014 1556   CREATININE 0.60 10/01/2015 1138   CREATININE 0.74 08/29/2014 1556   CALCIUM 9.4 10/01/2015 1133   CALCIUM 8.5 08/29/2014 1556   PROT 7.2 10/01/2015 1133   PROT 7.9 02/01/2014 1605   ALBUMIN 4.0 10/01/2015 1133   ALBUMIN 4.0 02/01/2014 1605   AST 26 10/01/2015 1133   AST 20 02/01/2014 1605   ALT 22 10/01/2015 1133   ALT 30 02/01/2014 1605   ALKPHOS 66 10/01/2015 1133   ALKPHOS 74 02/01/2014 1605   BILITOT 0.5 10/01/2015 1133   BILITOT 0.4 02/01/2014 1605   GFRNONAA >60 10/01/2015 1133   GFRNONAA >60 08/29/2014 1556   GFRNONAA >60 02/01/2014 1605   GFRAA >60 10/01/2015 1133   GFRAA >60 08/29/2014 1556   GFRAA >60 02/01/2014 1605      ASSESSMENT AND PLAN 45 y.o. year old female  has a past medical history of Seizures (HCC); Headache; and Hemiplegic  migraine (10/07/2015). here with:  1. Seizures 2. Headache  We will increase the patient's Zonegran. The patient cannot tolerate taking the medication during the day as it makes her very drowsy. She will continue taking 50 mg in the morning and increase to  150 mg in the evening. She will let me know if this is not beneficial. The patient should not operate a motor vehicle until she is seizure-free for 6 months. She will follow-up in 3 months or sooner if needed.    Butch Penny, MSN, NP-C 01/07/2016, 9:14 AM [facility] [address] [place], [place] [address] [phone]

## 2016-01-14 NOTE — Progress Notes (Signed)
I reviewed note and agree with plan.   Locklan Canoy R. Virdia Ziesmer, MD  Certified in Neurology, Neurophysiology and Neuroimaging  [facility] [address], [address] [place], [place] [address] [phone]   

## 2016-02-04 ENCOUNTER — Emergency Department
Admission: EM | Admit: 2016-02-04 | Discharge: 2016-02-04 | Disposition: A | Discharge status: Home / Self Care | Payer: Worker's Compensation | Attending: Emergency Medicine | Admitting: Emergency Medicine

## 2016-02-04 ENCOUNTER — Encounter: Payer: Self-pay | Admitting: Emergency Medicine

## 2016-02-04 ENCOUNTER — Emergency Department: Payer: Worker's Compensation

## 2016-02-04 DIAGNOSIS — R0602 Shortness of breath: Secondary | ICD-10-CM | POA: Diagnosis present

## 2016-02-04 DIAGNOSIS — F17210 Nicotine dependence, cigarettes, uncomplicated: Secondary | ICD-10-CM | POA: Insufficient documentation

## 2016-02-04 DIAGNOSIS — T59811A Toxic effect of smoke, accidental (unintentional), initial encounter: Secondary | ICD-10-CM

## 2016-02-04 DIAGNOSIS — J705 Respiratory conditions due to smoke inhalation: Secondary | ICD-10-CM | POA: Insufficient documentation

## 2016-02-04 LAB — COMPREHENSIVE METABOLIC PANEL
ALT: 16 U/L (ref 14–54)
AST: 24 U/L (ref 15–41)
Albumin: 4.2 g/dL (ref 3.5–5.0)
Alkaline Phosphatase: 69 U/L (ref 38–126)
Anion gap: 8 (ref 5–15)
BUN: 10 mg/dL (ref 6–20)
CHLORIDE: 106 mmol/L (ref 101–111)
CO2: 23 mmol/L (ref 22–32)
CREATININE: 0.58 mg/dL (ref 0.44–1.00)
Calcium: 9.2 mg/dL (ref 8.9–10.3)
GFR calc non Af Amer: >60 mL/min (ref >60)
Glucose, Bld: 95 mg/dL (ref 65–99)
POTASSIUM: 3.6 mmol/L (ref 3.5–5.1)
SODIUM: 137 mmol/L (ref 135–145)
Total Bilirubin: 0.5 mg/dL (ref 0.3–1.2)
Total Protein: 7.4 g/dL (ref 6.5–8.1)

## 2016-02-04 LAB — CARBOXYHEMOGLOBIN
Carboxyhemoglobin: 5.5 % (ref 1.5–9.0)
METHEMOGLOBIN: 1 %
O2 Saturation: 69.5 %
Total oxygen content: 65 mL/dL

## 2016-02-04 LAB — CBC WITH DIFFERENTIAL/PLATELET
BASOS ABS: 0.1 10*3/uL (ref 0–0.1)
BASOS PCT: 1 %
EOS PCT: 2 %
Eosinophils Absolute: 0.2 10*3/uL (ref 0–0.7)
HCT: 39 % (ref 35.0–47.0)
Hemoglobin: 12.8 g/dL (ref 12.0–16.0)
LYMPHS ABS: 3 10*3/uL (ref 1.0–3.6)
LYMPHS PCT: 23 %
MCH: 26.9 pg (ref 26.0–34.0)
MCHC: 32.9 g/dL (ref 32.0–36.0)
MCV: 81.7 fL (ref 80.0–100.0)
Monocytes Absolute: 0.7 10*3/uL (ref 0.2–0.9)
Monocytes Relative: 5 %
NEUTROS ABS: 9.3 10*3/uL — AB (ref 1.4–6.5)
NEUTROS PCT: 69 %
PLATELETS: 329 10*3/uL (ref 150–440)
RBC: 4.78 MIL/uL (ref 3.80–5.20)
RDW: 14.2 % (ref 11.5–14.5)
WBC: 13.3 10*3/uL — AB (ref 3.6–11.0)

## 2016-02-04 LAB — LIPASE, BLOOD: Lipase: 18 U/L (ref 11–51)

## 2016-02-04 MED ORDER — PHENOL 1.4 % MT LIQD
1.0000 | OROMUCOSAL | Status: DC | PRN
  Administered 2016-02-04: 1 via OROMUCOSAL

## 2016-02-04 MED ORDER — IBUPROFEN 800 MG PO TABS
ORAL_TABLET | ORAL | Status: AC
  Administered 2016-02-04: 800 mg via ORAL
  Filled 2016-02-04: qty 1

## 2016-02-04 MED ORDER — IBUPROFEN 800 MG PO TABS
800.0000 mg | ORAL_TABLET | Freq: Once | ORAL | Status: AC
  Administered 2016-02-04: 800 mg via ORAL
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
## 2016-02-04 NOTE — Discharge Instructions (Signed)
Chemical Inhalation Injury A chemical inhalation injury is an internal injury, such as lung damage, that results from breathing in fumes of a chemical or harmful substance (toxic agent). Chemical inhalation injuries most often occur:  During fires, when materials that are burned release chemicals into the environment.  During work accidents, when large quantities of toxic chemicals are spilled at [employer] or industrial sites. Chemical inhalation injuries vary in severity. An injury tends to be more severe:  The more acidic or alkaline the chemical is.  The more concentrated the substance is.  The longer you are exposed to the substance. RISK FACTORS You are at a high risk for a chemical inhalation injury if you:  Are exposed to burning materials.  Work with chemicals, solvents, or cleaners. SIGNS AND SYMPTOMS Symptoms of a chemical inhalation injury may include:  Hoarse voice.  Shortness of breath or trouble breathing.  Chest pain.  Pale or blue skin.  Mucus production.  Cough.  Weakness.  Dizziness or fainting. DIAGNOSIS Most chemical inhalation injuries can be diagnosed with a physical exam and medical history. Tests may be done to check for lung damage. They may include:  A blood oxygen level test.  A chest X-ray.  Pulmonary function tests. There are no tests to identify the specific chemical or substance that caused the injury. TREATMENT  There is no specific treatment for a chemical inhalation injury. Most treatment is directed at improving the ability of the lungs to deliver oxygen to the body. Time is needed for lung tissue to heal. Supportive treatment may include:  Aerosol treatments to decrease swelling in the airways.  Suctioning of the airways to remove excess mucus.  Supplemental oxygen. HOME CARE INSTRUCTIONS  Do not use any tobacco products, including cigarettes, chewing tobacco, or electronic cigarettes. If you need help quitting, ask your  health care provider.  Do not allow yourself to be exposed to any airway irritants, such as cigarette smoke or smoke from a fireplace.  Follow your health care provider's instructions for the use of any inhalers.  Take medicines only as directed by your health care provider.  Keep all follow-up visits as directed by your health care provider. This is important. SEEK MEDICAL CARE IF:  Your symptoms are not improving as your health care provider predicted. SEEK IMMEDIATE MEDICAL CARE IF:  Your symptoms get worse.  You have increasing shortness of breath or wheezing.  Your skin or your lips appear very pale or blue.  You have a persistent cough.  You cough up blood or dark material.  You have chest pain or weakness.  You have a fever.  You faint.   This information is not intended to replace advice given to you by your health care provider. Make sure you discuss any questions you have with your health care provider.   Document Released: 06/15/2014 Document Reviewed: 06/15/2014 Elsevier Interactive Patient Education [employer]2016 Elsevier Inc.   Please return for any worsening trouble breathing even in the next few days. Please also return for any worsening abdominal pain or vomiting. At present everything looks fairly good and your lab work.
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
## 2016-02-04 NOTE — ED Notes (Signed)
XRAY PERFORMED

## 2016-04-08 ENCOUNTER — Encounter: Payer: Self-pay | Admitting: *Deleted

## 2016-04-08 ENCOUNTER — Encounter: Payer: Self-pay | Admitting: Adult Health

## 2016-04-08 ENCOUNTER — Ambulatory Visit (INDEPENDENT_AMBULATORY_CARE_PROVIDER_SITE_OTHER): Payer: Managed Care, Other (non HMO) | Admitting: Adult Health

## 2016-04-08 VITALS — BP 130/80 | HR 88 | Resp 20 | Ht 64.0 in | Wt 186.0 lb

## 2016-04-08 DIAGNOSIS — R569 Unspecified convulsions: Secondary | ICD-10-CM

## 2016-04-08 DIAGNOSIS — G43019 Migraine without aura, intractable, without status migrainosus: Secondary | ICD-10-CM

## 2016-04-08 MED ORDER — ZONISAMIDE 50 MG PO CAPS: ORAL_CAPSULE | ORAL | Status: DC

## 2016-04-08 NOTE — Progress Notes (Signed)
Received physician status notification from [facility] that they received referral and they will contact us once pt is scheduled for 72hr [facility] EEG.   

## 2016-04-08 NOTE — Progress Notes (Addendum)
PATIENT: [name] DOB: 07/16/1971  REASON FOR VISIT: follow up- migraine headaches, seizures HISTORY FROM: patient  HISTORY OF PRESENT ILLNESS: [name] is a 45 year old female with a history of migraine headaches and seizures. She returns today for follow-up. She reports that she has a current headache that is located in the left temporal region. She reports her pain at 10/10 on the pain scale. She has nausea and vomiting with her headaches as well as photophobia and phonophobia. She reports that the increase in zonogran has helped some with the severity of her headaches. But she continues to have daily headaches. She also reports numbness in the left arm and leg with her headaches. The numbness began after she had the intracranial hemorrhage in 2009. She reports occasionally she will see "dots" in her vision prior to a headache. She also has a history of seizures. She has not had any grand mal seizures however she continues to have episodes of staring throughout the day. She states that she can be at work and will have a lapse in memory. She states that it takes her a few seconds to remember what she was doing. She describes this as "blacking out." Her husband has noticed her staring off at home. He states that he will call her name several times but she does not respond initially. The patient had an EEG in the past that was relatively unremarkable. She is not operating a motor vehicle. She returns today for an evaluation.  HISTORY 01/07/16: [name] is a 45 year old with a migraine headaches and seizures. She returns today for follow-up. She is currently on Zonegran 50 mg twice a day. She also takes gabapentin 300 mg daily. She states that since she started Zonegran her severity of her headaches have improved however she continues to have daily headaches. The headaches normally occur on the left side of the neck and the back of the head. Her headaches are now a 7 out of 10 on the pain scale.  She will occasionally have intermittent numbness on the left side of the body. This does not always occur with a headache. However it's been present since she had the intracranial hemorrhage. The patient does confirm nausea and vomiting as well as photophobia and phonophobia with her headaches. The patient states that since she started zonegran it has seemed to help her seizures. She states occasionally at night she will have twitching in the left side of the face and will wake up and notice that she's bitten her cheek or tongue. Occasionally she'll have this twitching during the day but denies any loss of consciousness. She does state that she's been having episodes where she loses her memory. She states that she can be doing something and all of a sudden she can't remember what happened. These episodes only last for seconds. Her husband states that when he has witnessed it it's as if she is staring off into space. She is not operating a motor vehicle at this time. She returns today for an evaluation.  HISTORY 10/07/15 (WILLIS): [name] is a 45 year old black female with a history of migraine headaches since she was 45 years old. The patient indicates that over the last several months, her headaches have become more frequent in nature. The patient is having 3 or 4 headaches a week, the headaches may last several hours. The headaches tend to start in the back of the head, and then go forward. The patient reports some photophobia and  phonophobia with the headache, and some nausea and vomiting. She has some residual left-sided numbness following a right parietal intracranial hemorrhage that occurred in 2009. The numbness on the left side increases during the headaches. The patient believes that there may be some weakness as well. She does have some visual changes with some blurring of vision, and spots in front of the eyes. She reports occasional double vision. She has taken Advil without much benefit. She is on  low-dose Lyrica 25 mg at night and gabapentin 300 mg at night. The patient has been followed by Dr. Malvin Johns, a neurologist in the [place]. She comes to this office for an evaluation. She reports a history of seizures that in the past was worsened with the use of Ultram. The last seizure was 4 months ago. The patient may have generalized jerking, loss of consciousness, urinary incontinence, and occasional tongue biting. The patient had a blackout episode within the last week when she started feeling hot, and then had a syncopal event. 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 The intracranial hemorrhage source in 2009 was never determined, cerebral angiogram did not show an AVM or an aneurysm. The patient has had a recent MRI of the brain that showed no acute changes and she went to the emergency room on 10/01/2015.  REVIEW OF SYSTEMS: Out of a complete 14 system review of symptoms, the patient complains only of the following symptoms, and all other reviewed systems are negative.  Light sensitivity, double vision, blurred vision, excessive sweating, activity change, ringing in ears, trouble swallowing,  shortness of breath, nausea, joint pain, back pain, aching muscles, neck pain, confusion, decreased concentration, tremors, weakness, speech difficulty, numbness, headache, memory loss, anemia, environmental allergies, food allergies  ALLERGIES: Amoxicillin, penicillin, morphine, Tylenol, Topamax, shellfish  HOME MEDICATIONS: Outpatient Prescriptions Prior to Visit  Medication Sig Dispense Refill  . diphenhydrAMINE (BENADRYL) 25 MG tablet Take 1 tablet (25 mg total) by mouth every 6 (six) hours. 20 tablet 0  . gabapentin (NEURONTIN) 300 MG capsule Take 300 mg by mouth every evening.     . meloxicam (MOBIC) 15 MG tablet Take 15 mg by mouth daily.    . ondansetron (ZOFRAN-ODT) 4 MG disintegrating tablet Take 4 mg by mouth every 4 (four) hours as needed  for nausea or vomiting.    . zonisamide (ZONEGRAN) 50 MG capsule 1 tablet in the morning, 3 in the evening (Patient taking differently: Take 50-150 mg by mouth 2 (two) times daily. 1 tablet in the morning, 3 in the evening) 120 capsule 5   No facility-administered medications prior to visit.    PAST MEDICAL HISTORY: Past Medical History  Diagnosis Date  . Seizures (HCC)   . Headache   . Hemiplegic migraine 10/07/2015    PAST SURGICAL HISTORY: Past Surgical History  Procedure Laterality Date  . Abdominal hysterectomy      FAMILY HISTORY: Family History  Problem Relation Age of Onset  . Stroke Mother   . Hypertension Father   . Diabetes Father   . Hypertension Paternal Aunt   . Diabetes Paternal Aunt   . Stroke Paternal Aunt   . Hypertension Paternal Uncle   . Diabetes Paternal Uncle   . Stroke Paternal Uncle   . Hypertension Maternal Grandmother   . Aneurysm Paternal Grandmother   . Diabetes Paternal Grandmother   . Hypertension Paternal Grandfather   . Cancer Paternal Grandfather   . Seizures Cousin   .  Migraines Sister   . Migraines Brother   . Migraines Sister   . Migraines Sister   . Migraines Sister   . Migraines Sister   . Migraines Sister     SOCIAL HISTORY: Social History   Social History  . Marital Status: Married    Spouse Name: N/A  . Number of Children: 3  . Years of Education: 11   Occupational History  . [employer]    Social History Main Topics  . Smoking status: Current Every Day Smoker -- 1.50 packs/day    Types: Cigarettes  . Smokeless tobacco: Never Used  . Alcohol Use: 0.0 oz/week    0 Standard drinks or equivalent per week     Comment: occasionally  . Drug Use: No  . Sexual Activity: Not on file   Other Topics Concern  . Not on file   Social History Narrative   Patient drink about 2 cups of caffeine daily.   Patient is right handed.       PHYSICAL EXAM  Filed Vitals:   04/08/16 0727  BP: 130/80  Pulse: 88    Resp: 20  Height: 5\' 4"  (1.626 m)  Weight: 186 lb (84.369 kg)   Body mass index is 31.91 kg/(m^2).  Generalized: Well developed, Vomiting due to headache, tearful  Neurological examination  Mentation: Alert oriented to time, place, history taking. Follows all commands speech and language fluent, tearful Cranial nerve II-XII: Pupils were equal round reactive to light. Extraocular movements were full, visual field were full on confrontational test. Facial sensation and strength were normal. Uvula tongue midline. Head turning and shoulder shrug  were normal and symmetric. Motor: The motor testing reveals 5 over 5 strength of all 4 extremities. Good symmetric motor tone is noted throughout.  Sensory: Sensory testing is intact to soft touch on all 4 extremities. No evidence of extinction is noted.  Coordination: Cerebellar testing reveals good finger-nose-finger and heel-to-shin bilaterally.  Gait and station: Gait is normal. Tandem gait is normal. Romberg is negative. No drift is seen.  Reflexes: Deep tendon reflexes are symmetric and normal bilaterally.   DIAGNOSTIC DATA (LABS, IMAGING, TESTING) - I reviewed patient records, labs, notes, testing and imaging myself where available.  Lab Results  Component Value Date   WBC 13.3* 02/04/2016   HGB 12.8 02/04/2016   HCT 39.0 02/04/2016   MCV 81.7 02/04/2016   PLT 329 02/04/2016      Component Value Date/Time   NA 137 02/04/2016 0819   NA 145 08/29/2014 1556   K 3.6 02/04/2016 0819   K 4.0 08/29/2014 1556   CL 106 02/04/2016 0819   CL 113* 08/29/2014 1556   CO2 23 02/04/2016 0819   CO2 24 08/29/2014 1556   GLUCOSE 95 02/04/2016 0819   GLUCOSE 87 08/29/2014 1556   BUN 10 02/04/2016 0819   BUN 9 08/29/2014 1556   CREATININE 0.58 02/04/2016 0819   CREATININE 0.74 08/29/2014 1556   CALCIUM 9.2 02/04/2016 0819   CALCIUM 8.5 08/29/2014 1556   PROT 7.4 02/04/2016 0819   PROT 7.9 02/01/2014 1605   ALBUMIN 4.2 02/04/2016 0819    ALBUMIN 4.0 02/01/2014 1605   AST 24 02/04/2016 0819   AST 20 02/01/2014 1605   ALT 16 02/04/2016 0819   ALT 30 02/01/2014 1605   ALKPHOS 69 02/04/2016 0819   ALKPHOS 74 02/01/2014 1605   BILITOT 0.5 02/04/2016 0819   BILITOT 0.4 02/01/2014 1605   GFRNONAA >60 02/04/2016 0819   GFRNONAA >60  08/29/2014 1556   GFRNONAA >60 02/01/2014 1605   GFRAA >60 02/04/2016 0819   GFRAA >60 08/29/2014 1556   GFRAA >60 02/01/2014 1605     ASSESSMENT AND PLAN 45 y.o. year old female  has a past medical history of Seizures (HCC); Headache; and Hemiplegic migraine (10/07/2015). here with:  1. Migraine headache 2. Seizures  The patient has had some benefit with Zonegran. I will increase Zonegran to 50 mg in morning and 200 mg in the evening. Patient continues to have daily headaches. She has at least 15 headache days a month. She also has severe headaches. These headaches are accompanied with worsening of numbness on the left side. I consulted with Dr. Lucia GaskinsAhern. The patient has tried Topamax, gabapentin and zonegran with little benefit. We can consider weekly trigger point injections? Today we will give the patient- Compazine I 10 mg IV, 500 mg IVSolu-Medrol and 500 mg IV Depakote. The patient continues to have staring episodes. Not sure if this represents seizures? I will set the patient up for a 3- day ambulatory EEG. She will follow-up in one to 2 months or sooner if needed.    Butch PennyMegan Elizar Alpern, MSN, NP-C 04/08/2016, 8:37 AM [facility] [address], [address] [place], [place] [address] [phone]  Personally reviewed plan as stated above and agree.  I have personally reviewed the history, evaluated lab date, reviewed imaging studies and agree with radiology interpretations.

## 2016-04-08 NOTE — Progress Notes (Signed)
Patient signed ABN in office today for migraine infusion acknowledging her insurance may require her to pay for office visit or migraine infusion. Gave signed form to billing, Marylene Landngela A.

## 2016-04-08 NOTE — Progress Notes (Signed)
Faxed completed form to [facility] to schedule pt for in-home 72-hour EEG. Fax: [phone]. Received confirmation.   

## 2016-04-08 NOTE — Patient Instructions (Signed)
Increase Zonegran to 50 mg in the morning and 200 mg in the evening Will look into trigger point injections and botox If your symptoms worsen or you develop new symptoms please let us know.

## 2016-04-12 NOTE — Progress Notes (Signed)
I have read the note, and I agree with the clinical assessment and plan.  Kourosh Jablonsky KEITH   

## 2016-04-29 NOTE — Progress Notes (Signed)
Hannah BadgerMegan- FYI  Received physician status notification from [facility] that the study is completed and has been loaded into server. They will send notification when report is scanned and ready for report to be generated for Dr Lura Emorbier to review/interpret.

## 2016-05-04 NOTE — Progress Notes (Signed)
Placed in MM/NP inbox for review.

## 2016-05-04 NOTE — Progress Notes (Signed)
Towana BadgerMegan- FYI, I gave to [place] to give to you, thank you! Received physician status notification from [facility] that the study is completed by Dr Lura Emorbier and interpretation faxed to us. Gave faxed results to Tylene FantasiaMegan M, NP for review.

## 2016-05-05 NOTE — Progress Notes (Signed)
Ambulatory EEG was normal. Please call patient.

## 2016-05-06 NOTE — Progress Notes (Signed)
I called pt and advised her that her EEG was normal. Pt verbalized understanding of results. Pt had no questions at this time but was encouraged to call back if questions arise.

## 2016-05-14 ENCOUNTER — Encounter: Payer: Self-pay | Admitting: Adult Health

## 2016-05-14 ENCOUNTER — Ambulatory Visit (INDEPENDENT_AMBULATORY_CARE_PROVIDER_SITE_OTHER): Payer: Managed Care, Other (non HMO) | Admitting: Adult Health

## 2016-05-14 VITALS — BP 122/68 | HR 92 | Wt 187.0 lb

## 2016-05-14 DIAGNOSIS — R569 Unspecified convulsions: Secondary | ICD-10-CM | POA: Diagnosis not present

## 2016-05-14 DIAGNOSIS — G43019 Migraine without aura, intractable, without status migrainosus: Secondary | ICD-10-CM | POA: Diagnosis not present

## 2016-05-14 MED ORDER — DIVALPROEX SODIUM ER 250 MG PO TB24: 250.0000 mg | ORAL_TABLET | Freq: Every day | ORAL | Status: DC

## 2016-05-14 NOTE — Progress Notes (Signed)
PATIENT: [name] DOB: 12/28/1970  REASON FOR VISIT: follow up- migraine headaches, seizures HISTORY FROM: patient  HISTORY OF PRESENT ILLNESS: [name] is a 45 year old female with a history of migraine headaches and seizures. She returns today for follow-up. She reports that she is not had any seizure events. Her ambulatory EEG was unremarkable. She reports that she continues to have daily headaches. Some days her headaches are more severe than others. She did increase the Zonegran and reports that it helps with the severity but continues to have daily headaches. Her headaches are typically located in the left temporal region or the left occipital region. She does have photophobia, phonophobia nausea and vomiting with severe headaches. She reports that she's been on Lyrica, gabapentin and Topamax and Depakote. She reports that the only reason why she weaned off Lyrica and Depakote was because of weight gain. She returns today for an evaluation.  HISTORY 04/08/16: [name] is a 45 year old female with a history of migraine headaches and seizures. She returns today for follow-up. She reports that she has a current headache that is located in the left temporal region. She reports her pain at 10/10 on the pain scale. She has nausea and vomiting with her headaches as well as photophobia and phonophobia. She reports that the increase in zonogran has helped some with the severity of her headaches. But she continues to have daily headaches. She also reports numbness in the left arm and leg with her headaches. The numbness began after she had the intracranial hemorrhage in 2009. She reports occasionally she will see "dots" in her vision prior to a headache. She also has a history of seizures. She has not had any grand mal seizures however she continues to have episodes of staring throughout the day. She states that she can be at work and will have a lapse in memory. She states that it takes her a few  seconds to remember what she was doing. She describes this as "blacking out." Her husband has noticed her staring off at home. He states that he will call her name several times but she does not respond initially. The patient had an EEG in the past that was relatively unremarkable. She is not operating a motor vehicle. She returns today for an evaluation.  HISTORY 01/07/16: [name] is a 45 year old with a migraine headaches and seizures. She returns today for follow-up. She is currently on Zonegran 50 mg twice a day. She also takes gabapentin 300 mg daily. She states that since she started Zonegran her severity of her headaches have improved however she continues to have daily headaches. The headaches normally occur on the left side of the neck and the back of the head. Her headaches are now a 7 out of 10 on the pain scale. She will occasionally have intermittent numbness on the left side of the body. This does not always occur with a headache. However it's been present since she had the intracranial hemorrhage. The patient does confirm nausea and vomiting as well as photophobia and phonophobia with her headaches. The patient states that since she started zonegran it has seemed to help her seizures. She states occasionally at night she will have twitching in the left side of the face and will wake up and notice that she's bitten her cheek or tongue. Occasionally she'll have this twitching during the day but denies any loss of consciousness. She does state that she's been having episodes where she loses her  memory. She states that she can be doing something and all of a sudden she can't remember what happened. These episodes only last for seconds. Her husband states that when he has witnessed it it's as if she is staring off into space. She is not operating a motor vehicle at this time. She returns today for an evaluation.  HISTORY 10/07/15 (WILLIS): [name] is a 45 year old black female with a history of  migraine headaches since she was 45 years old. The patient indicates that over the last several months, her headaches have become more frequent in nature. The patient is having 3 or 4 headaches a week, the headaches may last several hours. The headaches tend to start in the back of the head, and then go forward. The patient reports some photophobia and phonophobia with the headache, and some nausea and vomiting. She has some residual left-sided numbness following a right parietal intracranial hemorrhage that occurred in 2009. The numbness on the left side increases during the headaches. The patient believes that there may be some weakness as well. She does have some visual changes with some blurring of vision, and spots in front of the eyes. She reports occasional double vision. She has taken Advil without much benefit. She is on low-dose Lyrica 25 mg at night and gabapentin 300 mg at night. The patient has been followed by Dr. Malvin JohnsPotter, [place] neurolog[place]. She comes to this office for an evaluation. She reports a history of seizures that in the past was worsened with the use of Ultram. The last seizure was 4 months ago. The patient may have generalized jerking, loss of consciousness, urinary incontinence, and occasional tongue biting. The patient had a blackout episode within the last week when she started feeling hot, and then had a syncopal event. 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 The intracranial hemorrhage source in 2009 was never determined, cerebral angiogram did not show an AVM or an aneurysm. The patient has had a recent MRI of the brain that showed no acute changes and she went to the emergency room on 10/01/2015.  REVIEW OF SYSTEMS: Out of a complete 14 system review of symptoms, the patient complains only of the following symptoms, and all other reviewed systems are negative.  Dizziness, headache, numbness, weakness, back pain,  aching muscles, muscle cramps, neck pain, neck stiffness, confusion, insomnia, nausea, vomiting, shortness of breath, chest tightness, blurred vision, eye discharge, eye itching, excessive sweating, ringing in ears, trouble swallowing  ALLERGIES: Allergies  Allergen Reactions  . Amoxicillin Anaphylaxis  . Morphine And Related Anaphylaxis  . Penicillins Anaphylaxis    Has patient had a PCN reaction causing immediate rash, facial/tongue/throat swelling, SOB or lightheadedness with hypotension: Yes Has patient had a PCN reaction causing severe rash involving mucus membranes or skin necrosis: Yes Has patient had a PCN reaction that required hospitalization Yes Has patient had a PCN reaction occurring within the last 10 years: Yes If all of the above answers are "NO", then may proceed with Cephalosporin use.   . Shellfish Allergy Anaphylaxis  . Tylenol [Acetaminophen] Anaphylaxis  . Topamax [Topiramate] Nausea And Vomiting    HOME MEDICATIONS: Outpatient Prescriptions Prior to Visit  Medication Sig Dispense Refill  . cyclobenzaprine (FLEXERIL) 10 MG tablet Take 10 mg by mouth at bedtime.    . diphenhydrAMINE (BENADRYL) 25 MG tablet Take 1 tablet (25 mg total) by mouth every 6 (six) hours. 20 tablet 0  .  gabapentin (NEURONTIN) 300 MG capsule Take 300 mg by mouth every evening.     . meloxicam (MOBIC) 15 MG tablet Take 15 mg by mouth daily.    . ondansetron (ZOFRAN-ODT) 4 MG disintegrating tablet Take 4 mg by mouth every 4 (four) hours as needed for nausea or vomiting.    . zonisamide (ZONEGRAN) 50 MG capsule 1 tablet in the morning, 4 in the evening 150 capsule 5   No facility-administered medications prior to visit.    PAST MEDICAL HISTORY: Past Medical History  Diagnosis Date  . Seizures (HCC)   . Headache   . Hemiplegic migraine 10/07/2015    PAST SURGICAL HISTORY: Past Surgical History  Procedure Laterality Date  . Abdominal hysterectomy      FAMILY HISTORY: Family  History  Problem Relation Age of Onset  . Stroke Mother   . Hypertension Father   . Diabetes Father   . Hypertension Paternal Aunt   . Diabetes Paternal Aunt   . Stroke Paternal Aunt   . Hypertension Paternal Uncle   . Diabetes Paternal Uncle   . Stroke Paternal Uncle   . Hypertension Maternal Grandmother   . Aneurysm Paternal Grandmother   . Diabetes Paternal Grandmother   . Hypertension Paternal Grandfather   . Cancer Paternal Grandfather   . Seizures Cousin   . Migraines Sister   . Migraines Brother   . Migraines Sister   . Migraines Sister   . Migraines Sister   . Migraines Sister   . Migraines Sister     SOCIAL HISTORY: Social History   Social History  . Marital Status: Married    Spouse Name: N/A  . Number of Children: 3  . Years of Education: 11   Occupational History  . [employer]    Social History Main Topics  . Smoking status: Current Every Day Smoker -- 1.50 packs/day    Types: Cigarettes  . Smokeless tobacco: Never Used  . Alcohol Use: 0.0 oz/week    0 Standard drinks or equivalent per week     Comment: occasionally  . Drug Use: No  . Sexual Activity: Not on file   Other Topics Concern  . Not on file   Social History Narrative   Patient drink about 2 cups of caffeine daily.   Patient is right handed.       PHYSICAL EXAM  There were no vitals filed for this visit. There is no weight on file to calculate BMI.  Generalized: Well developed, in no acute distress   Neurological examination  Mentation: Alert oriented to time, place, history taking. Follows all commands speech and language fluent Cranial nerve II-XII: Pupils were equal round reactive to light. Extraocular movements were full, visual field were full on confrontational test. Facial sensation and strength were normal. Uvula tongue midline. Head turning and shoulder shrug  were normal and symmetric. Motor: The motor testing reveals 5 over 5 strength of all 4 extremities.  Good symmetric motor tone is noted throughout.  Sensory: Sensory testing is intact to soft touch on all 4 extremities. No evidence of extinction is noted.  Coordination: Cerebellar testing reveals good finger-nose-finger and heel-to-shin bilaterally.  Gait and station: Gait is normal. Tandem gait is unsteady. Romberg is negative. No drift is seen.  Reflexes: Deep tendon reflexes are symmetric and normal bilaterally.   DIAGNOSTIC DATA (LABS, IMAGING, TESTING) - I reviewed patient records, labs, notes, testing and imaging myself where available.  Lab Results  Component Value Date   WBC  13.3* 02/04/2016   HGB 12.8 02/04/2016   HCT 39.0 02/04/2016   MCV 81.7 02/04/2016   PLT 329 02/04/2016      Component Value Date/Time   NA 137 02/04/2016 0819   NA 145 08/29/2014 1556   K 3.6 02/04/2016 0819   K 4.0 08/29/2014 1556   CL 106 02/04/2016 0819   CL 113* 08/29/2014 1556   CO2 23 02/04/2016 0819   CO2 24 08/29/2014 1556   GLUCOSE 95 02/04/2016 0819   GLUCOSE 87 08/29/2014 1556   BUN 10 02/04/2016 0819   BUN 9 08/29/2014 1556   CREATININE 0.58 02/04/2016 0819   CREATININE 0.74 08/29/2014 1556   CALCIUM 9.2 02/04/2016 0819   CALCIUM 8.5 08/29/2014 1556   PROT 7.4 02/04/2016 0819   PROT 7.9 02/01/2014 1605   ALBUMIN 4.2 02/04/2016 0819   ALBUMIN 4.0 02/01/2014 1605   AST 24 02/04/2016 0819   AST 20 02/01/2014 1605   ALT 16 02/04/2016 0819   ALT 30 02/01/2014 1605   ALKPHOS 69 02/04/2016 0819   ALKPHOS 74 02/01/2014 1605   BILITOT 0.5 02/04/2016 0819   BILITOT 0.4 02/01/2014 1605   GFRNONAA >60 02/04/2016 0819   GFRNONAA >60 08/29/2014 1556   GFRNONAA >60 02/01/2014 1605   GFRAA >60 02/04/2016 0819   GFRAA >60 08/29/2014 1556   GFRAA >60 02/01/2014 1605     ASSESSMENT AND PLAN 45 y.o. year old female  has a past medical history of Seizures (HCC); Headache; and Hemiplegic migraine (10/07/2015). here with:  1. Migraine 2. seizures  The patient seizures seem to be  controlled with Zonegran however she continues to have headaches. In the past she has tolerated Depakote well with the exception of weight gain. However the patient states that she isn't sure she can contribute the weight gain  to Depakote or Lyrica. She is willing to try Depakote again. We will start off with Depakote ER 250 mg daily. I have reviewed the side effects with the patient. The patient has had a hysterectomy-so possibility of pregnancy is no concern. Patient advised that if her headache frequency or severity does not improve she will let us know. Follow-up in 6 months with Dr. Anne Hahn.   Butch Penny, MSN, NP-C 05/14/2016, 11:45 AM [facility] [address] [place], [place] [address] [phone]

## 2016-05-14 NOTE — Patient Instructions (Signed)
Continue Zonegran Start Depakote ER 250 mg daily If your symptoms worsen or you develop new symptoms please let us know.   Valproic Acid, Divalproex Sodium delayed or extended-release tablets What is this medicine? DIVALPROEX SODIUM (dye VAL pro ex SO dee um) is used to prevent seizures caused by some forms of epilepsy. It is also used to treat bipolar mania and to prevent migraine headaches. This medicine may be used for other purposes; ask your health care provider or pharmacist if you have questions. What should I tell my health care provider before I take this medicine? They need to know if you have any of these conditions: -blood disease -brain damage or disease -kidney disease -liver disease -low blood proteins -mitochondrial disease -suicidal thoughts, plans, or attempt; a previous suicide attempt by you or a family member -urea cycle disorder (UCD) -an unusual or allergic reaction to divalproex sodium, other medicines, foods, dyes, or preservatives -pregnant or trying to get pregnant -breast-feeding How should I use this medicine? Take this medicine by mouth with a drink of water. Follow the directions on the prescription label. Do not crush or chew. If this medicine upsets your stomach, take it with food or milk. Take your medicine at regular intervals. Do not take it more often than directed. Talk to your pediatrician regarding the use of this medicine in children. Special care may be needed. Overdosage: If you think you have taken too much of this medicine contact a poison control center or emergency room at once. NOTE: This medicine is only for you. Do not share this medicine with others. What if I miss a dose? If you miss a dose, take it as soon as you can. If it is almost time for your next dose, take only that dose. Do not take double or extra doses. What may interact with this medicine? -aspirin -barbiturates, like phenobarbital -diazepam -isoniazid -medicines for  depression, anxiety, or psychotic disturbances -medicines that treat or prevent blood clots like warfarin -meropenem -other seizure medicines -rifampin -tolbutamide -zidovudine This list may not describe all possible interactions. Give your health care provider a list of all the medicines, herbs, non-prescription drugs, or dietary supplements you use. Also tell them if you smoke, drink alcohol, or use illegal drugs. Some items may interact with your medicine. What should I watch for while using this medicine? Visit your doctor or health care professional for regular checks on your progress. If you are taking this medicine to treat epilepsy (seizures), do not stop taking it suddenly. This increases the risk of seizures. Wear a medical identification bracelet or chain to say you have epilepsy or seizures, and carry a card that lists all your medicines. You may get drowsy, dizzy, or have blurred vision. Do not drive, use machinery, or do anything that needs mental alertness until you know how this medicine affects you. To reduce dizzy or fainting spells, do not sit or stand up quickly, especially if you are an older patient. Alcohol can increase drowsiness and dizziness. Avoid alcoholic drinks. This medicine can cause blood problems. This can mean slow healing and a risk of infection. Problems can arise if you need dental work, and in the day to day care of your teeth. Try to avoid damage to your teeth and gums when you brush or floss your teeth. This medicine can make you more sensitive to the sun. Keep out of the sun. If you cannot avoid being in the sun, wear protective clothing and use sunscreen. Do not use  sun lamps or tanning beds/booths. The use of this medicine may increase the chance of suicidal thoughts or actions. Pay special attention to how you are responding while on this medicine. Any worsening of mood, or thoughts of suicide or dying should be reported to your health care professional right  away. Women who become pregnant while using this medicine may enroll in the [place] [employer] Antiepileptic Drug Pregnancy Registry by calling [phone]. This registry collects information about the safety of antiepileptic drug use during pregnancy. Contact your doctor or healthcare professional if you notice any part of your medicine in your stool. Your healthcare provider may want to check the amount of medicine in your blood if this happens. What side effects may I notice from receiving this medicine? Side effects that you should report to your doctor or health care professional as soon as possible: -allergic reactions like skin rash, itching or hives, swelling of the face, lips, or tongue -changes in the frequency or severity of seizures -double vision or uncontrollable eye movements -nausea and vomiting -redness, blistering, peeling or loosening of the skin, including inside the mouth -stomach pain or cramps -trembling of hands or arms -unusual bleeding or bruising or pinpoint red spots on the skin -unusual swelling of the arms or legs -unusually weak or tired -worsening of mood, thoughts or actions of suicide or dying -yellowing of skin or eyes Side effects that usually do not require medical attention (report to your doctor or health care professional if they continue or are bothersome): -change in menstrual cycle -diarrhea or constipation -headache -loss of bladder control -loss of hair or unusual growth of hair -loss or increase in appetite -weight gain or loss This list may not describe all possible side effects. Call your doctor for medical advice about side effects. You may report side effects to FDA at 1-800-FDA-1088. Where should I keep my medicine? Keep out of reach of children. Store at room temperature between 15 and 30 degrees C (59 and 86 degrees F). Keep container tightly closed. Throw away any unused medicine after the expiration date. NOTE: This sheet is a  summary. It may not cover all possible information. If you have questions about this medicine, talk to your doctor, pharmacist, or health care provider.    2016, [employer]. (2012-06-07 11:50:42)

## 2016-05-14 NOTE — Progress Notes (Signed)
I have read the note, and I agree with the clinical assessment and plan.  [name] KEITH   

## 2016-08-23 ENCOUNTER — Emergency Department: Payer: Managed Care, Other (non HMO)

## 2016-08-23 ENCOUNTER — Emergency Department
Admission: EM | Admit: 2016-08-23 | Discharge: 2016-08-23 | Disposition: A | Discharge status: Home / Self Care | Payer: Managed Care, Other (non HMO) | Attending: Emergency Medicine | Admitting: Emergency Medicine

## 2016-08-23 DIAGNOSIS — J029 Acute pharyngitis, unspecified: Secondary | ICD-10-CM | POA: Insufficient documentation

## 2016-08-23 DIAGNOSIS — H6693 Otitis media, unspecified, bilateral: Secondary | ICD-10-CM | POA: Insufficient documentation

## 2016-08-23 DIAGNOSIS — H6690 Otitis media, unspecified, unspecified ear: Secondary | ICD-10-CM

## 2016-08-23 DIAGNOSIS — F17210 Nicotine dependence, cigarettes, uncomplicated: Secondary | ICD-10-CM | POA: Insufficient documentation

## 2016-08-23 DIAGNOSIS — R062 Wheezing: Secondary | ICD-10-CM

## 2016-08-23 LAB — POCT RAPID STREP A: Streptococcus, Group A Screen (Direct): NEGATIVE

## 2016-08-23 MED ORDER — PREDNISONE 10 MG PO TABS: 10.0000 mg | ORAL_TABLET | Freq: Every day | ORAL | 0 refills | Status: DC

## 2016-08-23 MED ORDER — ALBUTEROL SULFATE HFA 108 (90 BASE) MCG/ACT IN AERS: 2.0000 | INHALATION_SPRAY | Freq: Four times a day (QID) | RESPIRATORY_TRACT | 2 refills | Status: DC | PRN

## 2016-08-23 MED ORDER — IBUPROFEN 800 MG PO TABS
800.0000 mg | ORAL_TABLET | Freq: Once | ORAL | Status: AC
  Administered 2016-08-23: 800 mg via ORAL
  Filled 2016-08-23: qty 1

## 2016-08-23 MED ORDER — AZITHROMYCIN 250 MG PO TABS: ORAL_TABLET | ORAL | 0 refills | Status: DC

## 2016-08-23 MED ORDER — IPRATROPIUM-ALBUTEROL 0.5-2.5 (3) MG/3ML IN SOLN
3.0000 mL | Freq: Once | RESPIRATORY_TRACT | Status: AC
  Administered 2016-08-23: 3 mL via RESPIRATORY_TRACT
  Filled 2016-08-23: qty 3

## 2016-08-23 NOTE — ED Provider Notes (Signed)
[facility]-EMERGENCY DEPARTMENT Provider Note   CSN: [id number] Arrival date & time: 08/23/16  1009     History   Chief Complaint Chief Complaint  Patient presents with  . Sore Throat    HPI [name] is a 45 y.o. female presents to the emergency department for evaluation of bilateral ear pain, sore throat, cough. Patient has had symptoms for 3 days. She denies any fevers but has had some chills. She has taken Alka-Seltzer over-the-counter, 2 days ago took ibuprofen. She denies any difficulty with swallowing. She does note some intermittent wheezing and mild shortness of breath with coughing. Her cough is productive. Ear pain and sore throat is 8 out of 10 she describes the ears as pressure and burning. She denies any drainage. No nausea vomiting or diarrhea.  HPI  Past Medical History:  Diagnosis Date  . Headache   . Hemiplegic migraine 10/07/2015  . Seizures [facility](HCC)     Patient Active Problem List   Diagnosis Date Noted  . Seizures (HCC) 10/07/2015  . Hemiplegic migraine 10/07/2015    Past Surgical History:  Procedure Laterality Date  . ABDOMINAL HYSTERECTOMY      OB History    Gravida Para Term Preterm AB Living   3 3           SAB TAB Ectopic Multiple Live Births                   Home Medications    Prior to Admission medications   Medication Sig Start Date End Date Taking? Authorizing Provider  albuterol (PROVENTIL HFA;VENTOLIN HFA) 108 (90 Base) MCG/ACT inhaler Inhale 2 puffs into the lungs every 6 (six) hours as needed for wheezing or shortness of breath. 08/23/16   Evon Slackhomas C Wanetta Funderburke, PA-C  azithromycin (ZITHROMAX Z-PAK) 250 MG tablet Take 2 tablets (500 mg) on  Day 1,  followed by 1 tablet (250 mg) once daily on Days 2 through 5. 08/23/16   Evon Slackhomas C Paola Flynt, PA-C  cyclobenzaprine (FLEXERIL) 10 MG tablet Take 10 mg by mouth at bedtime.    Historical Provider, MD  diphenhydrAMINE (BENADRYL) 25 MG tablet Take 1 tablet (25 mg total) by mouth every 6 (six)  hours. 10/01/15   Arby BarretteMarcy Pfeiffer, MD  divalproex (DEPAKOTE ER) 250 MG 24 hr tablet Take 1 tablet (250 mg total) by mouth daily. 05/14/16   Butch PennyMegan Millikan, NP  gabapentin (NEURONTIN) 300 MG capsule Take 300 mg by mouth every evening.     Historical Provider, MD  meloxicam (MOBIC) 15 MG tablet Take 15 mg by mouth daily.    Historical Provider, MD  ondansetron (ZOFRAN-ODT) 4 MG disintegrating tablet Take 4 mg by mouth every 4 (four) hours as needed for nausea or vomiting.    Historical Provider, MD  predniSONE (DELTASONE) 10 MG tablet Take 1 tablet (10 mg total) by mouth daily. 6,5,4,3,2,1 six day taper 08/23/16   Evon Slackhomas C Neftali Thurow, PA-C  zonisamide (ZONEGRAN) 50 MG capsule 1 tablet in the morning, 4 in the evening 04/08/16   Butch PennyMegan Millikan, NP    Family History Family History  Problem Relation Age of Onset  . Stroke Mother   . Hypertension Father   . Diabetes Father   . Hypertension Paternal Aunt   . Diabetes Paternal Aunt   . Stroke Paternal Aunt   . Hypertension Paternal Uncle   . Diabetes Paternal Uncle   . Stroke Paternal Uncle   . Hypertension Maternal Grandmother   . Aneurysm Paternal Grandmother   .  Diabetes Paternal Grandmother   . Hypertension Paternal Grandfather   . Cancer Paternal Grandfather   . Seizures Cousin   . Migraines Sister   . Migraines Brother   . Migraines Sister   . Migraines Sister   . Migraines Sister   . Migraines Sister   . Migraines Sister     Social History Social History  Substance Use Topics  . Smoking status: Current Every Day Smoker    Packs/day: 1.50    Types: Cigarettes  . Smokeless tobacco: Never Used  . Alcohol use 0.0 oz/week     Comment: occasionally     Allergies   Amoxicillin; Morphine and related; Penicillins; Shellfish allergy; Tylenol [acetaminophen]; and Topamax [topiramate]   Review of Systems Review of Systems  Constitutional: Negative for chills and fever.  HENT: Positive for congestion, ear pain, rhinorrhea and sore  throat. Negative for drooling, hearing loss, mouth sores and trouble swallowing.   Eyes: Negative for pain, redness and visual disturbance.  Respiratory: Positive for cough and wheezing. Negative for shortness of breath.   Cardiovascular: Negative for chest pain, palpitations and leg swelling.  Gastrointestinal: Negative for abdominal pain and vomiting.  Genitourinary: Negative for dysuria and hematuria.  Musculoskeletal: Negative for arthralgias, back pain, neck pain and neck stiffness.  Skin: Negative for color change and rash.  Neurological: Negative for dizziness, seizures, syncope and headaches.  All other systems reviewed and are negative.    Physical Exam Updated Vital Signs BP 139/74 (BP Location: Left Arm)   Pulse 94   Temp 98.3 F (36.8 C) (Oral)   Resp 18   Ht 5\' 4"  (1.626 m)   Wt 88 kg   SpO2 96%   BMI 33.30 kg/m   Physical Exam  Constitutional: She is oriented to person, place, and time. She appears well-developed and well-nourished. No distress.  HENT:  Head: Normocephalic and atraumatic.  Right Ear: Hearing and external ear normal. No drainage. Tympanic membrane is injected and erythematous.  Left Ear: Hearing and external ear normal. No drainage. Tympanic membrane is injected and erythematous.  Nose: Rhinorrhea present.  Mouth/Throat: Uvula is midline and oropharynx is clear and moist. No oral lesions. No trismus in the jaw. No dental abscesses, uvula swelling or lacerations. No oropharyngeal exudate, posterior oropharyngeal edema or posterior oropharyngeal erythema.  Eyes: EOM are normal. Pupils are equal, round, and reactive to light. Right eye exhibits no discharge. Left eye exhibits no discharge.  Neck: Normal range of motion. Neck supple.  Cardiovascular: Normal rate, regular rhythm and intact distal pulses.   Pulmonary/Chest: Effort normal. No stridor. No respiratory distress. She has wheezes (mild expiratory). She exhibits no tenderness.  Abdominal: Soft.  She exhibits no distension. There is no tenderness.  Musculoskeletal: Normal range of motion. She exhibits no edema.  Lymphadenopathy:    She has cervical adenopathy (posterior).  Neurological: She is alert and oriented to person, place, and time. She has normal reflexes.  Skin: Skin is warm and dry.  Psychiatric: She has a normal mood and affect. Her behavior is normal. Judgment and thought content normal.     ED Treatments / Results  Labs (all labs ordered are listed, but only abnormal results are displayed) Labs Reviewed  CULTURE, GROUP A STREP [facility])  POCT RAPID STREP A    EKG  EKG Interpretation None       Radiology Dg Chest 2 View  Result Date: 08/23/2016 CLINICAL DATA:  Ear pain and throat pain. EXAM: CHEST  2 VIEW COMPARISON:  February 04, 2016 FINDINGS: The heart size is borderline. No other interval changes or acute abnormalities. No evidence of pneumonia. IMPRESSION: No active cardiopulmonary disease. Electronically Signed   By: Gerome Samavid  Williams III M.D   On: 08/23/2016 10:58    Procedures Procedures (including critical care time)  Medications Ordered in ED Medications  ibuprofen (ADVIL,MOTRIN) tablet 800 mg (800 mg Oral Given 08/23/16 1043)  ipratropium-albuterol (DUONEB) 0.5-2.5 (3) MG/3ML nebulizer solution 3 mL (3 mLs Nebulization Given 08/23/16 1043)     Initial Impression / Assessment and Plan / ED Course  I have reviewed the triage vital signs and the nursing notes.  Pertinent labs & imaging results that were available during my care of the patient were reviewed by me and considered in my medical decision making (see chart for details).  Clinical Course   [age] year old female presents to the emergency department for evaluation of cough, sore throat and congestion and bilateral ear pain. Rapid strep test negative, no signs of peritonsillar abscess. She is tolerating by mouth well. No drooling. She is diagnosed with viral pharyngitis. Bilateral ears with  erythematous tympanic membranes, will treat with azithromycin due to penicillin allergy. Mild wheezing improved with DuoNeb. No wheezing after treatment, good air movement after. Chest x-ray normal. She will take over-the-counter decongestant medications, return to the ER for any worsening symptoms urgent changes in her health.  Final Clinical Impressions(s) / ED Diagnoses   Final diagnoses:  Viral pharyngitis  Acute otitis media, unspecified otitis media type  Wheezing    New Prescriptions New Prescriptions   ALBUTEROL (PROVENTIL HFA;VENTOLIN HFA) 108 (90 BASE) MCG/ACT INHALER    Inhale 2 puffs into the lungs every 6 (six) hours as needed for wheezing or shortness of breath.   AZITHROMYCIN (ZITHROMAX Z-PAK) 250 MG TABLET    Take 2 tablets (500 mg) on  Day 1,  followed by 1 tablet (250 mg) once daily on Days 2 through 5.   PREDNISONE (DELTASONE) 10 MG TABLET    Take 1 tablet (10 mg total) by mouth daily. 6,5,4,3,2,1 six day taper     Evon Slackhomas C Markis Langland, PA-C 08/23/16 1133    Jeanmarie PlantJames A McShane, MD 08/23/16 1250

## 2016-08-23 NOTE — ED Notes (Signed)
Patient transported to X-ray 

## 2016-08-23 NOTE — Discharge Instructions (Signed)
Please take medications as prescribed. Increase fluids. Return to the ER for any fevers or shortness of breath, difficulty breathing, difficulty swallowing, worsening symptoms or any urgent changes in her health.

## 2016-08-23 NOTE — ED Notes (Signed)
Returned from XR 

## 2016-08-23 NOTE — ED Triage Notes (Signed)
Sore throat for 2 days.  reports sweats and chills

## 2016-08-23 NOTE — ED Notes (Signed)
Pt c/o bilateral ear pain and sore throat. Pt also has been gagging and dry heaving as well.  Sxs started a few days ago. Voice is hoarse. Able to drink some liquids, pain when swallowing.

## 2016-08-25 ENCOUNTER — Encounter: Payer: Self-pay | Admitting: Neurology

## 2016-08-26 LAB — CULTURE, GROUP A STREP (THRC)

## 2016-08-30 ENCOUNTER — Emergency Department
Admission: EM | Admit: 2016-08-30 | Discharge: 2016-08-30 | Disposition: A | Discharge status: Home / Self Care | Payer: Managed Care, Other (non HMO) | Attending: Emergency Medicine | Admitting: Emergency Medicine

## 2016-08-30 ENCOUNTER — Encounter: Payer: Self-pay | Admitting: Emergency Medicine

## 2016-08-30 DIAGNOSIS — Z79899 Other long term (current) drug therapy: Secondary | ICD-10-CM | POA: Diagnosis not present

## 2016-08-30 DIAGNOSIS — F17210 Nicotine dependence, cigarettes, uncomplicated: Secondary | ICD-10-CM | POA: Diagnosis not present

## 2016-08-30 DIAGNOSIS — L739 Follicular disorder, unspecified: Secondary | ICD-10-CM | POA: Insufficient documentation

## 2016-08-30 DIAGNOSIS — R51 Headache: Secondary | ICD-10-CM | POA: Diagnosis present

## 2016-08-30 HISTORY — DX: Cerebral aneurysm, nonruptured: I67.1

## 2016-08-30 MED ORDER — LIDOCAINE 4 % EX CREA: 1.0000 "application " | TOPICAL_CREAM | Freq: Four times a day (QID) | CUTANEOUS | 0 refills | Status: DC | PRN

## 2016-08-30 MED ORDER — ONDANSETRON 4 MG PO TBDP
ORAL_TABLET | ORAL | Status: AC
  Filled 2016-08-30: qty 1

## 2016-08-30 MED ORDER — SULFAMETHOXAZOLE-TRIMETHOPRIM 800-160 MG PO TABS: 1.0000 | ORAL_TABLET | Freq: Two times a day (BID) | ORAL | 0 refills | Status: DC

## 2016-08-30 MED ORDER — LIDOCAINE 5 % EX OINT
TOPICAL_OINTMENT | Freq: Once | CUTANEOUS | Status: AC
  Administered 2016-08-30: 21:00:00 via TOPICAL
  Filled 2016-08-30: qty 35.44

## 2016-08-30 MED ORDER — LIDOCAINE HCL 4 % EX SOLN
CUTANEOUS | Status: AC
  Filled 2016-08-30: qty 50

## 2016-08-30 MED ORDER — ONDANSETRON 4 MG PO TBDP
4.0000 mg | ORAL_TABLET | Freq: Once | ORAL | Status: AC
  Administered 2016-08-30: 4 mg via ORAL

## 2016-08-30 MED ORDER — LIDOCAINE 4 % EX CREA: TOPICAL_CREAM | Freq: Once | CUTANEOUS | Status: DC

## 2016-08-30 NOTE — ED Notes (Signed)
Pt with approx 2.4 cm circumfrential red raised area noted to posterior mid lower scalp. Pt with small area like an insect bite noted to left upper area outside of previously mentioned area. Pt with nausea, vomiting, chills. Pt states "it is making my whole head hurt and my neck." pt is able to flex neck, but states is not able to lie head on pillow due to pain. Pt tearful, states pain 10/10 when area is touched.

## 2016-08-30 NOTE — Discharge Instructions (Signed)
Please seek medical attention for any high fevers, chest pain, shortness of breath, change in behavior, persistent vomiting, bloody stool or any other new or concerning symptoms.  

## 2016-08-30 NOTE — ED Triage Notes (Signed)
Pt with "bites" or abscess noted to posterior lower scalp. Pt states she has had nausea, chills at home. Pt actively attempting to vomit at this time. Small area noted to mid posterior lower scalp approx 1 inch in circumfrence. Pt states area is tender to touch.

## 2016-08-30 NOTE — ED Provider Notes (Signed)
[facility] Emergency Department Provider Note    ____________________________________________   I have reviewed the triage vital signs and the nursing notes.   HISTORY  Chief Complaint Abscess   History limited by: Not Limited   HPI [name] is a 45 y.o. female who presents to the emergency department today because of concern for pain to the back of her head. She states that this started two days ago. This has gotten worse. It is severe. It is worse with pressure. She feels like the pain is starting to extend down the left side of her face. She has noticed a couple of red areas on her scalp which correlate to where she is having her pain. She has not noticed any fevers. She has had some nausea. Tried ibuprofen without any relief. Denies any similar symptoms in the past. Denies using any new hair products.    Past Medical History:  Diagnosis Date  . Brain aneurysm   . Headache   . Hemiplegic migraine 10/07/2015  . Seizures [facility](HCC)     Patient Active Problem List   Diagnosis Date Noted  . Seizures (HCC) 10/07/2015  . Hemiplegic migraine 10/07/2015    Past Surgical History:  Procedure Laterality Date  . ABDOMINAL HYSTERECTOMY      Prior to Admission medications   Medication Sig Start Date End Date Taking? Authorizing Provider  albuterol (PROVENTIL HFA;VENTOLIN HFA) 108 (90 Base) MCG/ACT inhaler Inhale 2 puffs into the lungs every 6 (six) hours as needed for wheezing or shortness of breath. 08/23/16   Evon Slackhomas C Gaines, PA-C  azithromycin (ZITHROMAX Z-PAK) 250 MG tablet Take 2 tablets (500 mg) on  Day 1,  followed by 1 tablet (250 mg) once daily on Days 2 through 5. 08/23/16   Evon Slackhomas C Gaines, PA-C  cyclobenzaprine (FLEXERIL) 10 MG tablet Take 10 mg by mouth at bedtime.    Historical Provider, MD  diphenhydrAMINE (BENADRYL) 25 MG tablet Take 1 tablet (25 mg total) by mouth every 6 (six) hours. 10/01/15   Arby BarretteMarcy Pfeiffer, MD  divalproex (DEPAKOTE ER)  250 MG 24 hr tablet Take 1 tablet (250 mg total) by mouth daily. 05/14/16   Butch PennyMegan Millikan, NP  gabapentin (NEURONTIN) 300 MG capsule Take 300 mg by mouth every evening.     Historical Provider, MD  meloxicam (MOBIC) 15 MG tablet Take 15 mg by mouth daily.    Historical Provider, MD  ondansetron (ZOFRAN-ODT) 4 MG disintegrating tablet Take 4 mg by mouth every 4 (four) hours as needed for nausea or vomiting.    Historical Provider, MD  predniSONE (DELTASONE) 10 MG tablet Take 1 tablet (10 mg total) by mouth daily. 6,5,4,3,2,1 six day taper 08/23/16   Evon Slackhomas C Gaines, PA-C  zonisamide (ZONEGRAN) 50 MG capsule 1 tablet in the morning, 4 in the evening 04/08/16   Butch PennyMegan Millikan, NP    Allergies Amoxicillin; Morphine and related; Penicillins; Rocephin [ceftriaxone sodium in dextrose]; Shellfish allergy; Tylenol [acetaminophen]; and Topamax [topiramate]  Family History  Problem Relation Age of Onset  . Stroke Mother   . Hypertension Father   . Diabetes Father   . Migraines Sister   . Migraines Brother   . Migraines Sister   . Migraines Sister   . Migraines Sister   . Migraines Sister   . Migraines Sister   . Hypertension Paternal Aunt   . Diabetes Paternal Aunt   . Stroke Paternal Aunt   . Hypertension Paternal Uncle   . Diabetes Paternal Uncle   .  Stroke Paternal Uncle   . Hypertension Maternal Grandmother   . Aneurysm Paternal Grandmother   . Diabetes Paternal Grandmother   . Hypertension Paternal Grandfather   . Cancer Paternal Grandfather   . Seizures Cousin     Social History Social History  Substance Use Topics  . Smoking status: Current Every Day Smoker    Packs/day: 1.50    Types: Cigarettes  . Smokeless tobacco: Never Used  . Alcohol use 0.0 oz/week     Comment: occasionally    Review of Systems  Constitutional: Negative for fever. Cardiovascular: Negative for chest pain. Respiratory: Negative for shortness of breath. Gastrointestinal: Negative for abdominal  pain, vomiting and diarrhea. Genitourinary: Negative for dysuria. Musculoskeletal: Negative for back pain. Skin: Positive for redness to the back of her head.  Neurological: Positive for headache.  10-point ROS otherwise negative.  ____________________________________________   PHYSICAL EXAM:  VITAL SIGNS: ED Triage Vitals  Enc Vitals Group     BP 08/30/16 1910 (!) 143/80     Pulse Rate 08/30/16 1910 80     Resp 08/30/16 1910 16     Temp 08/30/16 1910 98 F (36.7 C)     Temp Source 08/30/16 1910 Oral     SpO2 08/30/16 1910 100 %     Weight 08/30/16 1911 194 lb (88 kg)     Height 08/30/16 1911 5\' 4"  (1.626 m)     Head Circumference --      Peak Flow --      Pain Score 08/30/16 1911 10   Constitutional: Alert and oriented. Slightly anxious appearing. Eyes: Conjunctivae are normal. Normal extraocular movements. ENT   Head: Normocephalic and atraumatic.   Nose: No congestion/rhinnorhea.   Mouth/Throat: Mucous membranes are moist.   Neck: No stridor. Hematological/Lymphatic/Immunilogical: No cervical lymphadenopathy. Cardiovascular: Normal rate, regular rhythm.  No murmurs, rubs, or gallops.  Respiratory: Normal respiratory effort without tachypnea nor retractions. Breath sounds are clear and equal bilaterally. No wheezes/rales/rhonchi. Gastrointestinal: Soft and nontender. No distention.  Genitourinary: Deferred Musculoskeletal: Normal range of motion in all extremities. No lower extremity edema. Neurologic:  Normal speech and language. No gross focal neurologic deficits are appreciated.  Skin:  A few small areas of redness to the back of the scalp. No fluctuance. No discharge. Extremely tender to palpation. Psychiatric: Mood and affect are normal. Speech and behavior are normal. Patient exhibits appropriate insight and judgment.  ____________________________________________    LABS (pertinent  positives/negatives)  None  ____________________________________________   EKG  None  ____________________________________________    RADIOLOGY  None  ____________________________________________   PROCEDURES  Procedures  ____________________________________________   INITIAL IMPRESSION / ASSESSMENT AND PLAN / ED COURSE  Pertinent labs & imaging results that were available during my care of the patient were reviewed by me and considered in my medical decision making (see chart for details).  Patient with tender and red areas to the back of her scalp. On exam no fluctuance to suggest abscess. At this point think folliculitis/cellulitis likely. Will plan on discharging home with antibiotics.  ____________________________________________   FINAL CLINICAL IMPRESSION(S) / ED DIAGNOSES  Final diagnoses:  Folliculitis     Note: This dictation was prepared with Dragon dictation. Any transcriptional errors that result from this process are unintentional    Phineas SemenGraydon Maeley Matton, MD 08/30/16 1954

## 2016-09-02 ENCOUNTER — Encounter: Payer: Self-pay | Admitting: Emergency Medicine

## 2016-09-02 ENCOUNTER — Emergency Department
Admission: EM | Admit: 2016-09-02 | Discharge: 2016-09-02 | Disposition: A | Discharge status: Home / Self Care | Payer: Managed Care, Other (non HMO) | Attending: Emergency Medicine | Admitting: Emergency Medicine

## 2016-09-02 DIAGNOSIS — G40909 Epilepsy, unspecified, not intractable, without status epilepticus: Secondary | ICD-10-CM | POA: Insufficient documentation

## 2016-09-02 DIAGNOSIS — F17210 Nicotine dependence, cigarettes, uncomplicated: Secondary | ICD-10-CM | POA: Diagnosis not present

## 2016-09-02 DIAGNOSIS — R569 Unspecified convulsions: Secondary | ICD-10-CM

## 2016-09-02 DIAGNOSIS — L03811 Cellulitis of head [any part, except face]: Secondary | ICD-10-CM | POA: Insufficient documentation

## 2016-09-02 DIAGNOSIS — Z79899 Other long term (current) drug therapy: Secondary | ICD-10-CM | POA: Insufficient documentation

## 2016-09-02 LAB — LIPASE, BLOOD: LIPASE: 16 U/L (ref 11–51)

## 2016-09-02 LAB — CBC WITH DIFFERENTIAL/PLATELET
BASOS ABS: 0.1 10*3/uL (ref 0–0.1)
BASOS PCT: 1 %
EOS ABS: 0.2 10*3/uL (ref 0–0.7)
EOS PCT: 2 %
HCT: 42.1 % (ref 35.0–47.0)
Hemoglobin: 13.7 g/dL (ref 12.0–16.0)
Lymphocytes Relative: 22 %
Lymphs Abs: 3.2 10*3/uL (ref 1.0–3.6)
MCH: 26.2 pg (ref 26.0–34.0)
MCHC: 32.6 g/dL (ref 32.0–36.0)
MCV: 80.2 fL (ref 80.0–100.0)
MONO ABS: 1 10*3/uL — AB (ref 0.2–0.9)
Monocytes Relative: 7 %
Neutro Abs: 9.9 10*3/uL — ABNORMAL HIGH (ref 1.4–6.5)
Neutrophils Relative %: 68 %
PLATELETS: 328 10*3/uL (ref 150–440)
RBC: 5.24 MIL/uL — ABNORMAL HIGH (ref 3.80–5.20)
RDW: 13.7 % (ref 11.5–14.5)
WBC: 14.3 10*3/uL — ABNORMAL HIGH (ref 3.6–11.0)

## 2016-09-02 LAB — URINE DRUG SCREEN, QUALITATIVE (ARMC ONLY)
Amphetamines, Ur Screen: NOT DETECTED
BARBITURATES, UR SCREEN: NOT DETECTED
BENZODIAZEPINE, UR SCRN: NOT DETECTED
CANNABINOID 50 NG, UR ~~LOC~~: NOT DETECTED
Cocaine Metabolite,Ur ~~LOC~~: NOT DETECTED
MDMA (Ecstasy)Ur Screen: NOT DETECTED
Methadone Scn, Ur: NOT DETECTED
Opiate, Ur Screen: NOT DETECTED
PHENCYCLIDINE (PCP) UR S: NOT DETECTED
Tricyclic, Ur Screen: NOT DETECTED

## 2016-09-02 LAB — COMPREHENSIVE METABOLIC PANEL
ALBUMIN: 3.9 g/dL (ref 3.5–5.0)
ALT: 24 U/L (ref 14–54)
AST: 25 U/L (ref 15–41)
Alkaline Phosphatase: 70 U/L (ref 38–126)
Anion gap: 8 (ref 5–15)
BUN: 6 mg/dL (ref 6–20)
CHLORIDE: 107 mmol/L (ref 101–111)
CO2: 25 mmol/L (ref 22–32)
Calcium: 9 mg/dL (ref 8.9–10.3)
Creatinine, Ser: 0.62 mg/dL (ref 0.44–1.00)
GFR calc Af Amer: >60 mL/min (ref >60)
Glucose, Bld: 104 mg/dL — ABNORMAL HIGH (ref 65–99)
POTASSIUM: 3.4 mmol/L — AB (ref 3.5–5.1)
Sodium: 140 mmol/L (ref 135–145)
Total Bilirubin: 0.3 mg/dL (ref 0.3–1.2)
Total Protein: 7.3 g/dL (ref 6.5–8.1)

## 2016-09-02 LAB — URINALYSIS COMPLETE WITH MICROSCOPIC (ARMC ONLY)
Bilirubin Urine: NEGATIVE
Glucose, UA: NEGATIVE mg/dL
Nitrite: NEGATIVE
PH: 9 — AB (ref 5.0–8.0)
PROTEIN: NEGATIVE mg/dL
SPECIFIC GRAVITY, URINE: 1.006 (ref 1.005–1.030)

## 2016-09-02 LAB — VALPROIC ACID LEVEL

## 2016-09-02 MED ORDER — LACOSAMIDE 50 MG PO TABS
100.0000 mg | ORAL_TABLET | Freq: Once | ORAL | Status: AC
  Administered 2016-09-02: 100 mg via ORAL
  Filled 2016-09-02: qty 2

## 2016-09-02 MED ORDER — CLINDAMYCIN HCL 300 MG PO CAPS: 300.0000 mg | ORAL_CAPSULE | Freq: Three times a day (TID) | ORAL | 0 refills | Status: AC

## 2016-09-02 MED ORDER — LORAZEPAM 2 MG/ML IJ SOLN
1.0000 mg | Freq: Once | INTRAMUSCULAR | Status: AC
  Administered 2016-09-02: 1 mg via INTRAVENOUS
  Filled 2016-09-02: qty 1

## 2016-09-02 MED ORDER — CLINDAMYCIN PHOSPHATE 600 MG/50ML IV SOLN
600.0000 mg | Freq: Once | INTRAVENOUS | Status: AC
  Administered 2016-09-02: 600 mg via INTRAVENOUS
  Filled 2016-09-02: qty 50

## 2016-09-02 MED ORDER — DEXTROSE 5 % IV SOLN
1000.0000 mg | Freq: Once | INTRAVENOUS | Status: AC
  Administered 2016-09-02: 1000 mg via INTRAVENOUS
  Filled 2016-09-02: qty 10

## 2016-09-02 MED ORDER — ONDANSETRON HCL 4 MG PO TABS: 4.0000 mg | ORAL_TABLET | Freq: Three times a day (TID) | ORAL | 0 refills | Status: DC | PRN

## 2016-09-02 MED ORDER — PROMETHAZINE HCL 25 MG/ML IJ SOLN
12.5000 mg | Freq: Once | INTRAMUSCULAR | Status: AC
  Administered 2016-09-02: 12.5 mg via INTRAVENOUS
  Filled 2016-09-02: qty 1

## 2016-09-02 MED ORDER — SODIUM CHLORIDE 0.9 % IV BOLUS (SEPSIS)
1000.0000 mL | Freq: Once | INTRAVENOUS | Status: AC
  Administered 2016-09-02: 1000 mL via INTRAVENOUS

## 2016-09-02 NOTE — ED Notes (Signed)
Seizure pads placed on floor and bed rails. MD to bedside.

## 2016-09-02 NOTE — ED Provider Notes (Signed)
[facility] Emergency Department Provider Note  ____________________________________________   I have reviewed the triage vital signs and the nursing notes.   HISTORY  Chief Complaint Seizures    HPI [name] is a 45 y.o. female presents today complaining of multiple different complaints. Patient has a small cellulitis in the back of her head from what appears to be infected skin follicles and she is taking Bactrim for the Bactrim is been making her throw up and that makes her uncomfortable. In addition, patient has a seizure disorder of long-standing, she is supposed to be be taking Vimpat and Depakote but has not been taking it because it makes her sleepy. This when she had a seizure. She denies fever or chills. She has not been vomiting since her seizure. She denies any trauma during the seizure was apparently witnessed seizure.       Past Medical History:  Diagnosis Date  . Brain aneurysm   . Headache   . Hemiplegic migraine 10/07/2015  . Seizures [facility])     Patient Active Problem List   Diagnosis Date Noted  . Seizures (HCC) 10/07/2015  . Hemiplegic migraine 10/07/2015    Past Surgical History:  Procedure Laterality Date  . ABDOMINAL HYSTERECTOMY      Prior to Admission medications   Medication Sig Start Date End Date Taking? Authorizing Provider  albuterol (PROVENTIL HFA;VENTOLIN HFA) 108 (90 Base) MCG/ACT inhaler Inhale 2 puffs into the lungs every 6 (six) hours as needed for wheezing or shortness of breath. 08/23/16  Yes Evon Slack, PA-C  divalproex (DEPAKOTE ER) 250 MG 24 hr tablet Take 1 tablet (250 mg total) by mouth daily. 05/14/16  Yes Butch Penny, NP  lidocaine (LMX) 4 % cream Apply 1 application topically 4 (four) times daily as needed. 08/30/16  Yes Phineas Semen, MD  sulfamethoxazole-trimethoprim (BACTRIM DS,SEPTRA DS) 800-160 MG tablet Take 1 tablet by mouth 2 (two) times daily. 08/30/16  Yes Phineas Semen, MD   zonisamide (ZONEGRAN) 50 MG capsule 1 tablet in the morning, 4 in the evening 04/08/16  Yes Butch Penny, NP  predniSONE (DELTASONE) 10 MG tablet Take 1 tablet (10 mg total) by mouth daily. 6,5,4,3,2,1 six day taper Patient not taking: Reported on 09/02/2016 08/23/16   Evon Slack, PA-C    Allergies Amoxicillin; Morphine and related; Penicillins; Rocephin [ceftriaxone sodium in dextrose]; Shellfish allergy; Tylenol [acetaminophen]; and Topamax [topiramate]  Family History  Problem Relation Age of Onset  . Stroke Mother   . Hypertension Father   . Diabetes Father   . Migraines Sister   . Migraines Brother   . Migraines Sister   . Migraines Sister   . Migraines Sister   . Migraines Sister   . Migraines Sister   . Hypertension Paternal Aunt   . Diabetes Paternal Aunt   . Stroke Paternal Aunt   . Hypertension Paternal Uncle   . Diabetes Paternal Uncle   . Stroke Paternal Uncle   . Hypertension Maternal Grandmother   . Aneurysm Paternal Grandmother   . Diabetes Paternal Grandmother   . Hypertension Paternal Grandfather   . Cancer Paternal Grandfather   . Seizures Cousin     Social History Social History  Substance Use Topics  . Smoking status: Current Every Day Smoker    Packs/day: 1.50    Types: Cigarettes  . Smokeless tobacco: Never Used  . Alcohol use 0.0 oz/week     Comment: occasionally    Review of Systems Constitutional: No fever/chills Eyes:  No visual changes. ENT: No sore throat. No stiff neck no neck pain Cardiovascular: Denies chest pain. Respiratory: Denies shortness of breath. Gastrointestinal:   no vomiting.  No diarrhea.  No constipation. Genitourinary: Negative for dysuria. Musculoskeletal: Negative lower extremity swelling Skin: See history of present illness. Neurological: Negative for severe headaches, focal weakness or numbness. 10-point ROS otherwise negative.  ____________________________________________   PHYSICAL EXAM:  VITAL  SIGNS: ED Triage Vitals  Enc Vitals Group     BP 09/02/16 1057 130/71     Pulse Rate 09/02/16 1057 78     Resp 09/02/16 1057 16     Temp 09/02/16 1057 97.6 F (36.4 C)     Temp Source 09/02/16 1057 Oral     SpO2 09/02/16 1057 100 %     Weight 09/02/16 1058 194 lb (88 kg)     Height 09/02/16 1058 5\' 4"  (1.626 m)     Head Circumference --      Peak Flow --      Pain Score 09/02/16 1058 10     Pain Loc --      Pain Edu? --      Excl. in GC? --     Constitutional: Alert and oriented. Well appearing and in no acute distress. Eyes: Conjunctivae are normal. PERRL. EOMI. Head: Atraumatic. Nose: No congestion/rhinnorhea. Mouth/Throat: Mucous membranes are moist.  Oropharynx non-erythematous. Neck: No stridor.   Nontender with no meningismus Cardiovascular: Normal rate, regular rhythm. Grossly normal heart sounds.  Good peripheral circulation. Respiratory: Normal respiratory effort.  No retractions. Lungs CTAB. Abdominal: Soft and nontender. No distention. No guarding no rebound Back:  There is no focal tenderness or step off.  there is no midline tenderness there are no lesions noted. there is no CVA tenderness Musculoskeletal: No lower extremity tenderness, no upper extremity tenderness. No joint effusions, no DVT signs strong distal pulses no edema Neurologic:  Normal speech and language. No gross focal neurologic deficits are appreciated.  Skin:  Skin is warm, dry and intact. There is a small cellulitic area to the back of her head which does not have any induration or fluctuance and there is a small pustule noted. See prior note. Psychiatric: Mood and affect are normal. Speech and behavior are normal.  ____________________________________________   LABS (all labs ordered are listed, but only abnormal results are displayed)  Labs Reviewed  VALPROIC ACID LEVEL - Abnormal; Notable for the following:       Result Value   Valproic Acid Lvl <10 (*)    All other components within  normal limits  CBC WITH DIFFERENTIAL/PLATELET - Abnormal; Notable for the following:    WBC 14.3 (*)    RBC 5.24 (*)    Neutro Abs 9.9 (*)    Monocytes Absolute 1.0 (*)    All other components within normal limits  COMPREHENSIVE METABOLIC PANEL - Abnormal; Notable for the following:    Potassium 3.4 (*)    Glucose, Bld 104 (*)    All other components within normal limits  LIPASE, BLOOD  URINALYSIS COMPLETEWITH MICROSCOPIC ([facility] ONLY)  URINE DRUG SCREEN, QUALITATIVE ([facility] ONLY)   ____________________________________________  EKG  I personally interpreted any EKGs ordered by me or triage  ____________________________________________  RADIOLOGY  I reviewed any imaging ordered by me or triage that were performed during my shift and, if possible, patient and/or family made aware of any abnormal findings. ____________________________________________   PROCEDURES  Procedure(s) performed: None  Procedures  Critical Care performed: None  ____________________________________________  INITIAL IMPRESSION / ASSESSMENT AND PLAN / ED COURSE  Pertinent labs & imaging results that were available during my care of the patient were reviewed by me and considered in my medical decision making (see chart for details).  Patient here with 2 problems, the first is that she had a seizure today. That's really what brought her in. Seizure was likely provoked by failure to take home medications which patient freely admits. Her Depakote level was nondetectable. We are loading her with Depakote and Vimpat per pharmacy. The second is that she has been vomiting after she started taking Bactrim. Often this medication is not well-tolerated. I'll take her off Bactrim and we will try clindamycin instead for her small area of erythema and discomfort in her scalp. We will give her a dose of IV clindamycin here prior to discharge to see if we can keep her infection under control. There is no evidence of  systemic infection. White count is mildly elevated however this is usual for postictal period. Patient otherwise is in no acute distress. Abdomen is benign. We will see if she can tolerate by mouth and we elected to get her home with clindamycin and we have instructed her to stop taking the Bactrim, I have also instructed her to return to the emergency room or see her doctor tomorrow for a recheck. Finally, I have strongly encouraged her to take her seizure medication and is not safe to do so. Extensive seizure precautions including driving etc. given and already understood by the patient.  Clinical Course    ____________________________________________   FINAL CLINICAL IMPRESSION(S) / ED DIAGNOSES  Final diagnoses:  None      This chart was dictated using voice recognition software.  Despite best efforts to proofread,  errors can occur which can change meaning.      Jeanmarie PlantJames A McShane, MD 09/02/16 1410

## 2016-09-02 NOTE — Discharge Instructions (Signed)
Stop taking the Bactrim. Take the clindamycin for infection or head. If you have increased pain fever vomiting or any other new or worrisome symptoms return immediately to medical assistance. Follow closely with your doctor tomorrow if possible, please come back for recheck either with us or your doctor at tomorrow or the next day. He did have a seizure today, this is likely because you're not taking her seizure medication. We've very strongly advised that you take it as prescribed and that he follow closely with your neurologist. As you know, you  should not drive soak in the tub climb ladders or doing anything else that, if interrupted by a seizure, could cause you harm.

## 2016-09-02 NOTE — Progress Notes (Signed)
Pharmacy Note  Pharmacy consulted to load valproate in this 45 year old female in ED c/o of cellulitis.  Patient with seizure in ED. Prescribed Depakote as an outpatient, not taking due to somnolence. VPA level < 10  Loaded with valproic acid 1000mg  IV x 1  Garlon HatchetJody Marek Nghiem, PharmD Clinical Pharmacist  09/02/2016 2:46 PM

## 2016-09-02 NOTE — ED Triage Notes (Signed)
Pt here from home via [facility] after a seizure this AM. Pt reports started on bactrim yesterday for cellulitis on back of head, reports vomiting since then. Pt alert and oriented upon arrival.

## 2016-09-03 LAB — URINE CULTURE: Culture: NO GROWTH

## 2016-10-04 ENCOUNTER — Emergency Department: Payer: Managed Care, Other (non HMO)

## 2016-10-04 ENCOUNTER — Encounter: Payer: Self-pay | Admitting: Emergency Medicine

## 2016-10-04 ENCOUNTER — Emergency Department
Admission: EM | Admit: 2016-10-04 | Discharge: 2016-10-04 | Disposition: A | Discharge status: Home / Self Care | Payer: Managed Care, Other (non HMO) | Attending: Emergency Medicine | Admitting: Emergency Medicine

## 2016-10-04 DIAGNOSIS — Z79899 Other long term (current) drug therapy: Secondary | ICD-10-CM | POA: Diagnosis not present

## 2016-10-04 DIAGNOSIS — J069 Acute upper respiratory infection, unspecified: Secondary | ICD-10-CM | POA: Diagnosis not present

## 2016-10-04 DIAGNOSIS — F17210 Nicotine dependence, cigarettes, uncomplicated: Secondary | ICD-10-CM | POA: Diagnosis not present

## 2016-10-04 DIAGNOSIS — M545 Low back pain: Secondary | ICD-10-CM | POA: Diagnosis not present

## 2016-10-04 DIAGNOSIS — M549 Dorsalgia, unspecified: Secondary | ICD-10-CM | POA: Insufficient documentation

## 2016-10-04 DIAGNOSIS — R319 Hematuria, unspecified: Secondary | ICD-10-CM

## 2016-10-04 DIAGNOSIS — B9789 Other viral agents as the cause of diseases classified elsewhere: Secondary | ICD-10-CM

## 2016-10-04 DIAGNOSIS — R05 Cough: Secondary | ICD-10-CM | POA: Diagnosis present

## 2016-10-04 DIAGNOSIS — R112 Nausea with vomiting, unspecified: Secondary | ICD-10-CM | POA: Insufficient documentation

## 2016-10-04 DIAGNOSIS — R350 Frequency of micturition: Secondary | ICD-10-CM | POA: Insufficient documentation

## 2016-10-04 LAB — COMPREHENSIVE METABOLIC PANEL
ALBUMIN: 4 g/dL (ref 3.5–5.0)
ALT: 18 U/L (ref 14–54)
AST: 23 U/L (ref 15–41)
Alkaline Phosphatase: 61 U/L (ref 38–126)
Anion gap: 8 (ref 5–15)
BUN: 13 mg/dL (ref 6–20)
CHLORIDE: 108 mmol/L (ref 101–111)
CO2: 25 mmol/L (ref 22–32)
Calcium: 9.5 mg/dL (ref 8.9–10.3)
Creatinine, Ser: 0.6 mg/dL (ref 0.44–1.00)
GFR calc Af Amer: >60 mL/min (ref >60)
GFR calc non Af Amer: >60 mL/min (ref >60)
GLUCOSE: 105 mg/dL — AB (ref 65–99)
POTASSIUM: 3.6 mmol/L (ref 3.5–5.1)
Sodium: 141 mmol/L (ref 135–145)
Total Bilirubin: 0.5 mg/dL (ref 0.3–1.2)
Total Protein: 7.8 g/dL (ref 6.5–8.1)

## 2016-10-04 LAB — CBC
HEMATOCRIT: 40.7 % (ref 35.0–47.0)
Hemoglobin: 13.2 g/dL (ref 12.0–16.0)
MCH: 26.1 pg (ref 26.0–34.0)
MCHC: 32.4 g/dL (ref 32.0–36.0)
MCV: 80.7 fL (ref 80.0–100.0)
Platelets: 356 10*3/uL (ref 150–440)
RBC: 5.05 MIL/uL (ref 3.80–5.20)
RDW: 14.4 % (ref 11.5–14.5)
WBC: 15.1 10*3/uL — ABNORMAL HIGH (ref 3.6–11.0)

## 2016-10-04 LAB — URINALYSIS, COMPLETE (UACMP) WITH MICROSCOPIC
BILIRUBIN URINE: NEGATIVE
Glucose, UA: NEGATIVE mg/dL
Ketones, ur: NEGATIVE mg/dL
Nitrite: NEGATIVE
Protein, ur: NEGATIVE mg/dL
SPECIFIC GRAVITY, URINE: 1.017 (ref 1.005–1.030)
pH: 6 (ref 5.0–8.0)

## 2016-10-04 LAB — LIPASE, BLOOD: LIPASE: 28 U/L (ref 11–51)

## 2016-10-04 LAB — POCT PREGNANCY, URINE: PREG TEST UR: NEGATIVE

## 2016-10-04 MED ORDER — KETOROLAC TROMETHAMINE 30 MG/ML IJ SOLN
30.0000 mg | Freq: Once | INTRAMUSCULAR | Status: AC
  Administered 2016-10-04: 30 mg via INTRAVENOUS
  Filled 2016-10-04: qty 1

## 2016-10-04 MED ORDER — ONDANSETRON HCL 4 MG/2ML IJ SOLN: 4.0000 mg | Freq: Once | INTRAMUSCULAR | Status: DC

## 2016-10-04 MED ORDER — METOCLOPRAMIDE HCL 5 MG/ML IJ SOLN
10.0000 mg | Freq: Once | INTRAMUSCULAR | Status: AC
  Administered 2016-10-04: 10 mg via INTRAVENOUS
  Filled 2016-10-04 (×2): qty 2

## 2016-10-04 MED ORDER — SODIUM CHLORIDE 0.9 % IV BOLUS (SEPSIS)
1000.0000 mL | Freq: Once | INTRAVENOUS | Status: AC
  Administered 2016-10-04: 1000 mL via INTRAVENOUS

## 2016-10-04 NOTE — ED Triage Notes (Addendum)
Cough and sinus congestion "for a while".  Coughing productively thick yellow and c/o left mid back pain with coughing.  Patient also reports vomiting since Friday.

## 2016-10-04 NOTE — ED Provider Notes (Signed)
[facility] Emergency Department Provider Note  ____________________________________________  Time seen: Approximately 6:19 PM  I have reviewed the triage vital signs and the nursing notes.   HISTORY  Chief Complaint URI and Cough   HPI [name] is a 45 y.o. female with history of seizure disorder and hemiplegic migraines who presents for evaluation of flulike illness. Patient reports 2 weeks of coughing howeverfor the last 2 days patient reports that her cough became productive of yellow sputum she has had body aches, congestion, headache, nausea, and a few episodes of nonbloody nonbilious emesis. She works in a health care facility. She has not received a flu shot. She has been exposed to a lot of patients that have been sick recently. Patient denies fever but has had chills. No diarrhea. No chest pain. She also endorses mild shortness of breath that is mostly present when she coughs. Patient reports for the last 2 days she has had pain on her left upper and lower back. She reports when she gets sick especially with the headaches usually has similar pain on her left side which has been attributed to her complex hemiplegic migraines. She currently endorses that the pain is an ache, 9 out of 10, located on the left upper and lower back, nonradiating. She denies dysuria but endorses frequency. She denies vaginal discharge. Patient denies personal or family history of blood clots, recent travel immobilization, leg pain or swelling, hemoptysis, exogenous hormones.  Past Medical History:  Diagnosis Date  . Brain aneurysm   . Headache   . Hemiplegic migraine 10/07/2015  . Seizures [facility](HCC)     Patient Active Problem List   Diagnosis Date Noted  . Seizures (HCC) 10/07/2015  . Hemiplegic migraine 10/07/2015    Past Surgical History:  Procedure Laterality Date  . ABDOMINAL HYSTERECTOMY      Prior to Admission medications   Medication Sig Start Date End Date  Taking? Authorizing Provider  albuterol (PROVENTIL HFA;VENTOLIN HFA) 108 (90 Base) MCG/ACT inhaler Inhale 2 puffs into the lungs every 6 (six) hours as needed for wheezing or shortness of breath. 08/23/16   Evon Slackhomas C Gaines, PA-C  divalproex (DEPAKOTE ER) 250 MG 24 hr tablet Take 1 tablet (250 mg total) by mouth daily. 05/14/16   Butch PennyMegan Millikan, NP  lidocaine (LMX) 4 % cream Apply 1 application topically 4 (four) times daily as needed. 08/30/16   Phineas SemenGraydon Goodman, MD  ondansetron (ZOFRAN) 4 MG tablet Take 1 tablet (4 mg total) by mouth every 8 (eight) hours as needed for nausea or vomiting. 09/02/16   Jeanmarie PlantJames A McShane, MD  predniSONE (DELTASONE) 10 MG tablet Take 1 tablet (10 mg total) by mouth daily. 6,5,4,3,2,1 six day taper Patient not taking: Reported on 09/02/2016 08/23/16   Evon Slackhomas C Gaines, PA-C  sulfamethoxazole-trimethoprim (BACTRIM DS,SEPTRA DS) 800-160 MG tablet Take 1 tablet by mouth 2 (two) times daily. 08/30/16   Phineas SemenGraydon Goodman, MD  zonisamide (ZONEGRAN) 50 MG capsule 1 tablet in the morning, 4 in the evening 04/08/16   Butch PennyMegan Millikan, NP    Allergies Amoxicillin; Morphine and related; Penicillins; Rocephin [ceftriaxone sodium in dextrose]; Shellfish allergy; Tylenol [acetaminophen]; and Topamax [topiramate]  Family History  Problem Relation Age of Onset  . Stroke Mother   . Hypertension Father   . Diabetes Father   . Migraines Sister   . Migraines Brother   . Migraines Sister   . Migraines Sister   . Migraines Sister   . Migraines Sister   . Migraines  Sister   . Hypertension Paternal Aunt   . Diabetes Paternal Aunt   . Stroke Paternal Aunt   . Hypertension Paternal Uncle   . Diabetes Paternal Uncle   . Stroke Paternal Uncle   . Hypertension Maternal Grandmother   . Aneurysm Paternal Grandmother   . Diabetes Paternal Grandmother   . Hypertension Paternal Grandfather   . Cancer Paternal Grandfather   . Seizures Cousin     Social History Social History  Substance Use  Topics  . Smoking status: Current Every Day Smoker    Packs/day: 1.50    Types: Cigarettes  . Smokeless tobacco: Never Used  . Alcohol use 0.0 oz/week     Comment: occasionally    Review of Systems  Constitutional: Negative for fever. + chills and body ache Eyes: Negative for visual changes. ENT: Negative for sore throat. + congestion Neck: No neck pain  Cardiovascular: Negative for chest pain. Respiratory: + shortness of breath and productive cough Gastrointestinal: Negative for abdominal pain, diarrhea. + N.V Genitourinary: Negative for dysuria. + frequency Musculoskeletal: + left upper and lower back pain. Skin: Negative for rash. Neurological: Negative for weakness or numbness. + HA Psych: No SI or HI  ____________________________________________   PHYSICAL EXAM:  VITAL SIGNS: ED Triage Vitals  Enc Vitals Group     BP 10/04/16 1705 139/72     Pulse Rate 10/04/16 1705 95     Resp 10/04/16 1705 18     Temp 10/04/16 1705 97.7 F (36.5 C)     Temp Source 10/04/16 1705 Oral     SpO2 10/04/16 1705 97 %     Weight 10/04/16 1704 194 lb (88 kg)     Height 10/04/16 1704 5\' 4"  (1.626 m)     Head Circumference --      Peak Flow --      Pain Score 10/04/16 1706 8     Pain Loc --      Pain Edu? --      Excl. in GC? --     Constitutional: Alert and oriented. Well appearing and in no apparent distress. HEENT:      Head: Normocephalic and atraumatic.         Eyes: b/l injected Conjunctivae. Sclera is non-icteric. EOMI. PERRL      Mouth/Throat: Mucous membranes are moist.       Neck: Supple with no signs of meningismus. Cardiovascular: Regular rate and rhythm. No murmurs, gallops, or rubs. 2+ symmetrical distal pulses are present in all extremities. No JVD. Respiratory: Normal respiratory effort. Lungs are clear to auscultation bilaterally. No wheezes, crackles, or rhonchi.  Gastrointestinal: Soft, non tender, and non distended with positive bowel sounds. No rebound or  guarding. Genitourinary: No CVA tenderness. Musculoskeletal: ttp diffuse over the muscles of the L upper and lower back. Nontender with normal range of motion in all extremities. No edema, cyanosis, or erythema of extremities. Neurologic: Normal speech and language. Face is symmetric. Moving all extremities. No gross focal neurologic deficits are appreciated. Skin: Skin is warm, dry and intact. No rash noted. Psychiatric: Mood and affect are normal. Speech and behavior are normal.  ____________________________________________   LABS (all labs ordered are listed, but only abnormal results are displayed)  Labs Reviewed  COMPREHENSIVE METABOLIC PANEL - Abnormal; Notable for the following:       Result Value   Glucose, Bld 105 (*)    All other components within normal limits  CBC - Abnormal; Notable for the following:  WBC 15.1 (*)    All other components within normal limits  URINALYSIS, COMPLETE (UACMP) WITH MICROSCOPIC - Abnormal; Notable for the following:    Color, Urine YELLOW (*)    APPearance HAZY (*)    Hgb urine dipstick LARGE (*)    Leukocytes, UA SMALL (*)    Bacteria, UA RARE (*)    Squamous Epithelial / LPF 0-5 (*)    All other components within normal limits  LIPASE, BLOOD  POCT PREGNANCY, URINE   ____________________________________________  EKG  none ____________________________________________  RADIOLOGY  CT renal: Tiny nonobstructing LEFT renal calculi. Probable LEFT renal cyst 12 mm diameter. Small umbilical hernia containing fat. Otherwise negative exam.  CXR: No active cardiopulmonary disease. No significant change in the appearance of the lungs. ____________________________________________   PROCEDURES  Procedure(s) performed: None Procedures Critical Care performed:  None ____________________________________________   INITIAL IMPRESSION / ASSESSMENT AND PLAN / ED COURSE  45 y.o. female with history of seizure disorder and hemiplegic  migraines who presents for evaluation of flulike illness x 2 days with cough productive of yellow sputum, body aches, congestion, headache, increasing urinary frequency, and now also complaining of pain on the left upper lower back. Patient is well-appearing, in no distress, she has injected bilateral conjunctivae with clear rhinorrhea, remaining of her physical exam is within normal limits. Blood work showing leukocytosis with white count of 15.1, normal CMP and lipase. UA with large amount of blood, rare bacteria, and leuks. Patient is status post hysterectomy. We'll pursue CT renal to rule out a kidney stone. Since patient is having urinary frequency we'll treat this as a possible urinary tract infection. Chest x-ray is pending to rule out pneumonia. Will give IVF, IV reglan and IV toradol for symptom relief.   Clinical Course as of Oct 04 2245  Wynelle LinkSun Oct 04, 2016  2028 Patient feels markedly improved and is requesting discharge at this time. Workup with no acute findings including CT renal study, chest x-ray, and blood work. Patient does have leukocytosis which goes with the picture of a viral URI.  [CV]    Clinical Course User Index [CV] Nita Sicklearolina Niobe Dick, MD    Pertinent labs & imaging results that were available during my care of the patient were reviewed by me and considered in my medical decision making (see chart for details).    ____________________________________________   FINAL CLINICAL IMPRESSION(S) / ED DIAGNOSES  Final diagnoses:  Viral URI with cough  Hematuria, unspecified type      NEW MEDICATIONS STARTED DURING THIS VISIT:  Discharge Medication List as of 10/04/2016  8:29 PM       Note:  This document was prepared using Dragon voice recognition software and may include unintentional dictation errors.    Nita Sicklearolina Melis Trochez, MD 10/04/16 [id number]

## 2016-10-04 NOTE — ED Notes (Signed)
ED Provider at bedside. 

## 2016-11-18 ENCOUNTER — Encounter: Payer: Self-pay | Admitting: Neurology

## 2016-11-18 ENCOUNTER — Ambulatory Visit (INDEPENDENT_AMBULATORY_CARE_PROVIDER_SITE_OTHER): Payer: Commercial Managed Care - PPO | Admitting: Neurology

## 2016-11-18 VITALS — Ht 64.0 in | Wt 196.0 lb

## 2016-11-18 DIAGNOSIS — R569 Unspecified convulsions: Secondary | ICD-10-CM | POA: Diagnosis not present

## 2016-11-18 DIAGNOSIS — G43419 Hemiplegic migraine, intractable, without status migrainosus: Secondary | ICD-10-CM

## 2016-11-18 MED ORDER — ZONISAMIDE 100 MG PO CAPS: ORAL_CAPSULE | ORAL | 3 refills | Status: DC

## 2016-11-18 MED ORDER — VENLAFAXINE HCL ER 37.5 MG PO CP24: ORAL_CAPSULE | ORAL | 2 refills | Status: DC

## 2016-11-18 NOTE — Patient Instructions (Signed)
  We will have you stop the Depakote. We will start effexor 37.5 mg taking one tablet a day for 1 week, then take 2 tablets a day for one week, then take 3 tablets daily.  We will try to initiate Botox therapy.

## 2016-11-18 NOTE — Progress Notes (Signed)
Reason for visit: Headache, seizures  Juanna CaoKatina S Cone is an 46 y.o. female  History of present illness:  Ms. Alva Garnetyner is a 46 year old right-handed black female with a history of seizures and a history of intractable migraine headache. The patient had seizure around the end of November 2017 associated with a viral illness. The patient has not had any seizures since that time. She remains on Zonegran, and she takes low-dose Depakote. The patient continues to have ongoing daily or virtually daily headaches. The patient is missing some work because of this and she feels that she may be in danger of losing her job. The patient indicates that she frequently gets left-sided numbness with her headache. She may have blurring of vision. Flickering lights and certain odors may bring on headaches. The patient has been on various medications in the past without benefit including gabapentin, Zonegran, Topamax, Lyrica, Depakote, Flexeril, she also takes BC powders. The patient has asthma, she cannot go on beta blockers. The patient cannot take triptan medications given the hemiplegic features of her migraine. She believes that she does get weak and numb on the left side with the headaches. She only has one or 2 days a month with no headache. The patient may have significant nausea at times with the headache. She returns for an evaluation. The patient may be spacey or confused with the headache.  Past Medical History:  Diagnosis Date  . Brain aneurysm   . Headache   . Hemiplegic migraine 10/07/2015  . Seizures (HCC)     Past Surgical History:  Procedure Laterality Date  . ABDOMINAL HYSTERECTOMY      Family History  Problem Relation Age of Onset  . Stroke Mother   . Hypertension Father   . Diabetes Father   . Migraines Sister   . Migraines Brother   . Migraines Sister   . Migraines Sister   . Migraines Sister   . Migraines Sister   . Migraines Sister   . Hypertension Paternal Aunt   . Diabetes  Paternal Aunt   . Stroke Paternal Aunt   . Hypertension Paternal Uncle   . Diabetes Paternal Uncle   . Stroke Paternal Uncle   . Hypertension Maternal Grandmother   . Aneurysm Paternal Grandmother   . Diabetes Paternal Grandmother   . Hypertension Paternal Grandfather   . Cancer Paternal Grandfather   . Seizures Cousin     Social history:  reports that she has been smoking Cigarettes.  She has been smoking about 0.50 packs per day. She has never used smokeless tobacco. She reports that she drinks alcohol. She reports that she does not use drugs.    Allergies  Allergen Reactions  . Amoxicillin Anaphylaxis  . Morphine And Related Anaphylaxis  . Penicillins Anaphylaxis    Has patient had a PCN reaction causing immediate rash, facial/tongue/throat swelling, SOB or lightheadedness with hypotension: Yes Has patient had a PCN reaction causing severe rash involving mucus membranes or skin necrosis: Yes Has patient had a PCN reaction that required hospitalization Yes Has patient had a PCN reaction occurring within the last 10 years: Yes If all of the above answers are "NO", then may proceed with Cephalosporin use.   . Rocephin [Ceftriaxone Sodium In Dextrose] Anaphylaxis  . Shellfish Allergy Anaphylaxis  . Tylenol [Acetaminophen] Anaphylaxis  . Topamax [Topiramate] Nausea And Vomiting    Medications:  Prior to Admission medications   Medication Sig Start Date End Date Taking? Authorizing Provider  albuterol (PROVENTIL HFA;VENTOLIN HFA)  108 (90 Base) MCG/ACT inhaler Inhale 2 puffs into the lungs every 6 (six) hours as needed for wheezing or shortness of breath. 08/23/16  Yes Evon Slack, PA-C  lidocaine (LMX) 4 % cream Apply 1 application topically 4 (four) times daily as needed. 08/30/16  Yes Phineas Semen, MD  ondansetron (ZOFRAN) 4 MG tablet Take 1 tablet (4 mg total) by mouth every 8 (eight) hours as needed for nausea or vomiting. 09/02/16  Yes Jeanmarie Plant, MD  venlafaxine XR  (EFFEXOR XR) 37.5 MG 24 hr capsule One in the morning and two in the evening 11/18/16   York Spaniel, MD  zonisamide [facility]) 100 MG capsule One capsule in the morning and 2 in the evening 11/18/16   York Spaniel, MD    ROS:  Out of a complete 14 system review of symptoms, the patient complains only of the following symptoms, and all other reviewed systems are negative.  Ringing in the ears, difficulty swallowing Light sensitivity, blurred vision Shortness of breath Excessive thirst Restless legs, frequent waking Frequency of urination Back pain, walking difficulty Anemia Headache, numbness, seizures, speech difficulty, weakness, tremors, facial drooping Confusion  Height 5\' 4"  (1.626 m), weight 196 lb (88.9 kg).  Physical Exam  General: The patient is alert and cooperative at the time of the examination.  Skin: No significant peripheral edema is noted.   Neurologic Exam  Mental status: The patient is alert and oriented x 3 at the time of the examination. The patient has apparent normal recent and remote memory, with an apparently normal attention span and concentration ability.   Cranial nerves: Facial symmetry is present. Speech is normal, no aphasia or dysarthria is noted. Extraocular movements are full. Visual fields are full.  Motor: The patient has good strength in all 4 extremities.  Sensory examination: Soft touch sensation is symmetric on the face, arms, and legs.  Coordination: The patient has good finger-nose-finger and heel-to-shin bilaterally.  Gait and station: The patient has a normal gait. Tandem gait is normal. Romberg is negative. No drift is seen.  Reflexes: Deep tendon reflexes are symmetric.   Assessment/Plan:  1. Intractable migraine headache with hemiplegic features  2. History seizures  The patient did have a seizure in November 2017. I have indicated that she should not drive for 6 months following that seizure event. The patient  will be taken off of the Depakote, she will be placed on Effexor for the headache. The patient should be a good candidate for Botox, we will try to initiate this treatment. She will follow-up in 3 months, sooner if needed.  Marlan Palau MD 11/18/2016 12:05 PM  [facility] [address] [place], [place] [address]  Phone [phone] Fax [phone]

## 2016-12-01 ENCOUNTER — Emergency Department
Admission: EM | Admit: 2016-12-01 | Discharge: 2016-12-01 | Disposition: A | Discharge status: Home / Self Care | Payer: Worker's Compensation | Attending: Student in an Organized Health Care Education/Training Program | Admitting: Student in an Organized Health Care Education/Training Program

## 2016-12-01 ENCOUNTER — Encounter: Payer: Self-pay | Admitting: Emergency Medicine

## 2016-12-01 ENCOUNTER — Emergency Department: Payer: Worker's Compensation

## 2016-12-01 DIAGNOSIS — R51 Headache: Secondary | ICD-10-CM

## 2016-12-01 DIAGNOSIS — W228XXA Striking against or struck by other objects, initial encounter: Secondary | ICD-10-CM | POA: Insufficient documentation

## 2016-12-01 DIAGNOSIS — Y999 Unspecified external cause status: Secondary | ICD-10-CM | POA: Diagnosis not present

## 2016-12-01 DIAGNOSIS — S0990XA Unspecified injury of head, initial encounter: Secondary | ICD-10-CM | POA: Diagnosis not present

## 2016-12-01 DIAGNOSIS — Y929 Unspecified place or not applicable: Secondary | ICD-10-CM | POA: Insufficient documentation

## 2016-12-01 DIAGNOSIS — Y939 Activity, unspecified: Secondary | ICD-10-CM | POA: Diagnosis not present

## 2016-12-01 DIAGNOSIS — F17210 Nicotine dependence, cigarettes, uncomplicated: Secondary | ICD-10-CM | POA: Insufficient documentation

## 2016-12-01 DIAGNOSIS — R519 Headache, unspecified: Secondary | ICD-10-CM

## 2016-12-01 MED ORDER — DIPHENHYDRAMINE HCL 50 MG/ML IJ SOLN
25.0000 mg | Freq: Once | INTRAMUSCULAR | Status: AC
  Administered 2016-12-01: 25 mg via INTRAVENOUS
  Filled 2016-12-01: qty 1

## 2016-12-01 MED ORDER — DEXAMETHASONE SODIUM PHOSPHATE 10 MG/ML IJ SOLN
10.0000 mg | Freq: Once | INTRAMUSCULAR | Status: AC
  Administered 2016-12-01: 10 mg via INTRAVENOUS
  Filled 2016-12-01: qty 1

## 2016-12-01 MED ORDER — MAGNESIUM SULFATE 2 GM/50ML IV SOLN
2.0000 g | Freq: Once | INTRAVENOUS | Status: DC
  Filled 2016-12-01: qty 50

## 2016-12-01 MED ORDER — KETOROLAC TROMETHAMINE 30 MG/ML IJ SOLN
15.0000 mg | Freq: Once | INTRAMUSCULAR | Status: AC
  Administered 2016-12-01: 15 mg via INTRAVENOUS
  Filled 2016-12-01: qty 1

## 2016-12-01 MED ORDER — PROCHLORPERAZINE EDISYLATE 5 MG/ML IJ SOLN
10.0000 mg | Freq: Once | INTRAMUSCULAR | Status: AC
  Administered 2016-12-01: 10 mg via INTRAVENOUS
  Filled 2016-12-01 (×2): qty 2

## 2016-12-01 NOTE — ED Notes (Signed)
Pt had episode of itching and slight redness at above needle insertion sight after Toradol injection. Site flushed with saline and pink area disappeared. Dr Roxan Hockeyobinson made aware. Pt denies itching.

## 2016-12-01 NOTE — ED Provider Notes (Signed)
[facility] Emergency Department Provider Note    First MD Initiated Contact with Patient 12/01/16 1228     (approximate)  I have reviewed the triage vital signs and the nursing notes.   HISTORY  Chief Complaint Head Injury    HPI [name] is a 46 y.o. female with history of hemiplegic migraines as well as seizures presents with acute severe headache that occurred while she was cleaning her floor next to the stretcher and when standing up at the back of her head on the stretcher. Denied any LOC but states that she saw stars. Did feel lightheaded and dizzy. States she also had left arm numbness and tingling which she gets frequently with migraines. Has seen a neurologist for this. No seizure-like activity. Denies any weakness. States the pain as 10 out of 10 in severity.   Past Medical History:  Diagnosis Date  . Brain aneurysm   . Headache   . Hemiplegic migraine 10/07/2015  . Seizures (HCC)    Family History  Problem Relation Age of Onset  . Stroke Mother   . Hypertension Father   . Diabetes Father   . Migraines Sister   . Migraines Brother   . Migraines Sister   . Migraines Sister   . Migraines Sister   . Migraines Sister   . Migraines Sister   . Hypertension Paternal Aunt   . Diabetes Paternal Aunt   . Stroke Paternal Aunt   . Hypertension Paternal Uncle   . Diabetes Paternal Uncle   . Stroke Paternal Uncle   . Hypertension Maternal Grandmother   . Aneurysm Paternal Grandmother   . Diabetes Paternal Grandmother   . Hypertension Paternal Grandfather   . Cancer Paternal Grandfather   . Seizures Cousin    Past Surgical History:  Procedure Laterality Date  . ABDOMINAL HYSTERECTOMY     Patient Active Problem List   Diagnosis Date Noted  . Seizures (HCC) 10/07/2015  . Hemiplegic migraine 10/07/2015      Prior to Admission medications   Medication Sig Start Date End Date Taking? Authorizing Provider  albuterol (PROVENTIL  HFA;VENTOLIN HFA) 108 (90 Base) MCG/ACT inhaler Inhale 2 puffs into the lungs every 6 (six) hours as needed for wheezing or shortness of breath. 08/23/16   Evon Slackhomas C Gaines, PA-C  lidocaine (LMX) 4 % cream Apply 1 application topically 4 (four) times daily as needed. 08/30/16   Phineas SemenGraydon Goodman, MD  ondansetron (ZOFRAN) 4 MG tablet Take 1 tablet (4 mg total) by mouth every 8 (eight) hours as needed for nausea or vomiting. 09/02/16   Jeanmarie PlantJames A McShane, MD  venlafaxine XR (EFFEXOR XR) 37.5 MG 24 hr capsule One in the morning and two in the evening 11/18/16   York Spanielharles K Willis, MD  zonisamide [facility](ZONEGRAN) 100 MG capsule One capsule in the morning and 2 in the evening 11/18/16   York Spanielharles K Willis, MD    Allergies Amoxicillin; Morphine and related; Penicillins; Rocephin [ceftriaxone sodium in dextrose]; Shellfish allergy; Tylenol [acetaminophen]; and Topamax [topiramate]    Social History Social History  Substance Use Topics  . Smoking status: Current Every Day Smoker    Packs/day: 0.50    Types: Cigarettes  . Smokeless tobacco: Never Used  . Alcohol use 0.0 oz/week     Comment: occasionally    Review of Systems Patient denies headaches, rhinorrhea, blurry vision, numbness, shortness of breath, chest pain, edema, cough, abdominal pain, nausea, vomiting, diarrhea, dysuria, fevers, rashes or hallucinations unless otherwise stated  above in HPI. ____________________________________________   PHYSICAL EXAM:  VITAL SIGNS: Vitals:   12/01/16 1154  BP: 129/82  Pulse: 75  Resp: 17  Temp: 97.7 F (36.5 C)    Constitutional: Alert and oriented. Well appearing and in no acute distress. Eyes: Conjunctivae are normal. PERRL. EOMI. Head: Atraumatic. Nose: No congestion/rhinnorhea. Mouth/Throat: Mucous membranes are moist.  Oropharynx non-erythematous. Neck: No stridor. Painless ROM. No cervical spine tenderness to palpation Hematological/Lymphatic/Immunilogical: No cervical  lymphadenopathy. Cardiovascular: Normal rate, regular rhythm. Grossly normal heart sounds.  Good peripheral circulation. Respiratory: Normal respiratory effort.  No retractions. Lungs CTAB. Gastrointestinal: Soft and nontender. No distention. No abdominal bruits. No CVA tenderness. Genitourinary:  Musculoskeletal: No lower extremity tenderness nor edema.  No joint effusions. Neurologic:  CN- intact.  No facial droop, Normal FNF.  Normal heel to shin.  Sensation intact bilaterally. Normal speech and language. No gross focal neurologic deficits are appreciated. No gait instability.  Skin:  Skin is warm, dry and intact. No rash noted. Psychiatric: Mood and affect are normal. Speech and behavior are normal.  ____________________________________________   LABS (all labs ordered are listed, but only abnormal results are displayed)  No results found for this or any previous visit (from the past 24 hour(s)). ____________________________________________  EKG____________________________________________  RADIOLOGY  I personally reviewed all radiographic images ordered to evaluate for the above acute complaints and reviewed radiology reports and findings.  These findings were personally discussed with the patient.  Please see medical record for radiology report.  ____________________________________________   PROCEDURES  Procedure(s) performed:  Procedures    Critical Care performed: no ____________________________________________   INITIAL IMPRESSION / ASSESSMENT AND PLAN / ED COURSE  Pertinent labs & imaging results that were available during my care of the patient were reviewed by me and considered in my medical decision making (see chart for details).  DDX: migraine, tension, sah, iph, sdh, contusion, fracture  [name] is a 47 y.o. who presents to the ED with with Hx of migraines and seizure disorder p/w acute HA for last few hours after hitting head as descrbied above.Marland Kitchen  Not worst HA ever. No fevers or neck pain. No vision loss. + photophobia.  Afebrile in ED. VSS. Exam as above. No meningeal signs. No CN, motor, sensory or cerebellar deficits. Temporal arteries palpable and non-tender. Appears uncomfortable but non-toxic.  Will provide IV fluids for hydration and IV medications for symptom control.  Will order CT head to eval for traumatic head injury.   Clinical Course as of Dec 01 1505  Tue Dec 01, 2016  1443 Patient reassessed and states her headache is currently 6 out of 10 in severity down from 10 out of 10 in severity. Patient sleeping and feels much improved. We'll continue to monitor. Clinical picture is not consistent with ICH, SAH, SDH, EDH, TIA, or CVA. No concern for meningitis or encephalitis. No concern for GCA/Temporal arteritis.  [PR]  1506 Patient's husband out requesting discharge home. This patient feeling much better. Repeat neuro exam nonfocal. Symptoms have resolved. Discussed signs and symptoms for which patient should return to the ER.  Patient was able to tolerate PO and was able to ambulate with a steady gait.  Have discussed with the patient and available family all diagnostics and treatments performed thus far and all questions were answered to the best of my ability. The patient demonstrates understanding and agreement with plan.   [PR]    Clinical Course User Index [PR] Willy Eddy, MD  ____________________________________________   FINAL CLINICAL IMPRESSION(S) / ED DIAGNOSES  Final diagnoses:  Injury of head, initial encounter  Acute nonintractable headache, unspecified headache type      NEW MEDICATIONS STARTED DURING THIS VISIT:  New Prescriptions   No medications on file     Note:  This document was prepared using Dragon voice recognition software and may include unintentional dictation errors.    Willy Eddy, MD 12/01/16 [phone]

## 2016-12-01 NOTE — ED Notes (Signed)
Family at bedside. 

## 2016-12-01 NOTE — Discharge Instructions (Signed)

## 2016-12-01 NOTE — ED Notes (Signed)
Pt currently being seen by neurology for left leg and left arm tingling that accompanies migraines, pt having slight left sided tingling now. Speech clear, face symmetrical, grips stronger in right than left. Pt tearful during assessment.

## 2016-12-01 NOTE — ED Notes (Signed)
Spoke with [name] with human resources at [employer] regarding if UDS was needed for workers comp due to workers comp profile saying only upon request. Hannah Landngela stated that "no UDS was needed."

## 2016-12-01 NOTE — ED Triage Notes (Signed)
Pt to ED c/o head pain since today.  States was cleaning floor next to stretcher and hit back of head on bed when she came up.  States had tingling sensation to left arm shortly after.  Pt stuttering while talking but family states his normal when she gets bad head pain, hx of migraines, seizures, and aneurism.

## 2016-12-19 ENCOUNTER — Encounter: Payer: Self-pay | Admitting: Emergency Medicine

## 2016-12-19 ENCOUNTER — Emergency Department
Admission: EM | Admit: 2016-12-19 | Discharge: 2016-12-19 | Disposition: A | Discharge status: Home / Self Care | Payer: Commercial Managed Care - PPO | Attending: Emergency Medicine | Admitting: Emergency Medicine

## 2016-12-19 ENCOUNTER — Emergency Department: Payer: Commercial Managed Care - PPO

## 2016-12-19 DIAGNOSIS — M79603 Pain in arm, unspecified: Secondary | ICD-10-CM

## 2016-12-19 DIAGNOSIS — M79601 Pain in right arm: Secondary | ICD-10-CM | POA: Diagnosis not present

## 2016-12-19 DIAGNOSIS — F17210 Nicotine dependence, cigarettes, uncomplicated: Secondary | ICD-10-CM | POA: Insufficient documentation

## 2016-12-19 DIAGNOSIS — Z79899 Other long term (current) drug therapy: Secondary | ICD-10-CM | POA: Diagnosis not present

## 2016-12-19 MED ORDER — MEPERIDINE HCL 50 MG/ML IJ SOLN
50.0000 mg | Freq: Once | INTRAMUSCULAR | Status: AC
  Administered 2016-12-19: 50 mg via INTRAMUSCULAR
  Filled 2016-12-19: qty 1

## 2016-12-19 MED ORDER — ONDANSETRON 4 MG PO TBDP
4.0000 mg | ORAL_TABLET | Freq: Once | ORAL | Status: AC
  Administered 2016-12-19: 4 mg via ORAL
  Filled 2016-12-19: qty 1

## 2016-12-19 MED ORDER — OXYCODONE HCL 5 MG PO TABS: 5.0000 mg | ORAL_TABLET | Freq: Four times a day (QID) | ORAL | 0 refills | Status: AC | PRN

## 2016-12-19 NOTE — ED Notes (Signed)
Ace wrap applied to right forearm

## 2016-12-19 NOTE — ED Provider Notes (Signed)
[facility] Emergency Department Provider Note ____________________________________________  Time seen: Approximately 3:06 PM  I have reviewed the triage vital signs and the nursing notes.   HISTORY  Chief Complaint Arm Pain   HPI Juanna CaoKatina S Froh is a 46 y.o. female who presents to the emergency department for evaluation of right arm pain. She was evaluated here on 12/01/2016 after sustaining a head injury. She states that while the nurse was giving her IV medications her site infiltrated and she began to have some swelling in her right hand. She states that she has been using warm compresses, but the pain and swelling has continued and is now moving further up her arm. She states that the arm and hand is extremely tender to touch. She has been taking ibuprofen without relief.   Past Medical History:  Diagnosis Date  . Brain aneurysm   . Headache   . Hemiplegic migraine 10/07/2015  . Seizures [facility](HCC)     Patient Active Problem List   Diagnosis Date Noted  . Seizures (HCC) 10/07/2015  . Hemiplegic migraine 10/07/2015    Past Surgical History:  Procedure Laterality Date  . ABDOMINAL HYSTERECTOMY      Prior to Admission medications   Medication Sig Start Date End Date Taking? Authorizing Provider  albuterol (PROVENTIL HFA;VENTOLIN HFA) 108 (90 Base) MCG/ACT inhaler Inhale 2 puffs into the lungs every 6 (six) hours as needed for wheezing or shortness of breath. 08/23/16   Evon Slackhomas C Gaines, PA-C  lidocaine (LMX) 4 % cream Apply 1 application topically 4 (four) times daily as needed. 08/30/16   Phineas SemenGraydon Goodman, MD  ondansetron (ZOFRAN) 4 MG tablet Take 1 tablet (4 mg total) by mouth every 8 (eight) hours as needed for nausea or vomiting. 09/02/16   Jeanmarie PlantJames A McShane, MD  oxyCODONE (ROXICODONE) 5 MG immediate release tablet Take 1 tablet (5 mg total) by mouth every 6 (six) hours as needed. 12/19/16 12/22/16  Chinita Pesterari B Maclin Guerrette, FNP  venlafaxine XR (EFFEXOR XR) 37.5 MG 24  hr capsule One in the morning and two in the evening 11/18/16   York Spanielharles K Willis, MD  zonisamide [facility](ZONEGRAN) 100 MG capsule One capsule in the morning and 2 in the evening 11/18/16   York Spanielharles K Willis, MD    Allergies Amoxicillin; Morphine and related; Penicillins; Rocephin [ceftriaxone sodium in dextrose]; Shellfish allergy; Tylenol [acetaminophen]; and Topamax [topiramate]  Family History  Problem Relation Age of Onset  . Stroke Mother   . Hypertension Father   . Diabetes Father   . Migraines Sister   . Migraines Brother   . Migraines Sister   . Migraines Sister   . Migraines Sister   . Migraines Sister   . Migraines Sister   . Hypertension Paternal Aunt   . Diabetes Paternal Aunt   . Stroke Paternal Aunt   . Hypertension Paternal Uncle   . Diabetes Paternal Uncle   . Stroke Paternal Uncle   . Hypertension Maternal Grandmother   . Aneurysm Paternal Grandmother   . Diabetes Paternal Grandmother   . Hypertension Paternal Grandfather   . Cancer Paternal Grandfather   . Seizures Cousin     Social History Social History  Substance Use Topics  . Smoking status: Current Every Day Smoker    Packs/day: 0.50    Types: Cigarettes  . Smokeless tobacco: Never Used  . Alcohol use 0.0 oz/week     Comment: occasionally    Review of Systems Constitutional: No recent illness. Cardiovascular: Denies chest pain or  palpitations. Respiratory: Denies shortness of breath. Musculoskeletal: Pain in right forearm. Skin: Negative for rash, wound, lesion. Neurological: Negative for focal weakness or numbness.  ____________________________________________   PHYSICAL EXAM:  VITAL SIGNS: ED Triage Vitals  Enc Vitals Group     BP 12/19/16 1325 137/71     Pulse Rate 12/19/16 1325 74     Resp 12/19/16 1325 18     Temp 12/19/16 1325 98.1 F (36.7 C)     Temp Source 12/19/16 1325 Oral     SpO2 12/19/16 1325 100 %     Weight 12/19/16 1317 196 lb (88.9 kg)     Height 12/19/16 1317 5\' 4"   (1.626 m)     Head Circumference --      Peak Flow --      Pain Score 12/19/16 1317 10     Pain Loc --      Pain Edu? --      Excl. in GC? --     Constitutional: Alert and oriented. Well appearing and in no acute distress. Eyes: Conjunctivae are normal. EOMI. Head: Atraumatic. Neck: No stridor.  Respiratory: Normal respiratory effort.   Musculoskeletal: Soft tissue tenderness over the right arm from below elbow to fingers. Full ROM of elbow, wrist, and hand. Neurologic:  Normal speech and language. No gross focal neurologic deficits are appreciated. Speech is normal. No gait instability. Skin:  Warm and dry. No erythema over the right arm. Skin blanchable. No lesions or wounds identified. Psychiatric: Mood and affect are normal. Speech and behavior are normal. ___________________________________________   LABS (all labs ordered are listed, but only abnormal results are displayed)  Labs Reviewed - No data to display ____________________________________________  RADIOLOGY  [place] negative for DVT per radiology. ____________________________________________   PROCEDURES  Procedure(s) performed: Light compression with Ace bandage applied to the right arm from the hand to the elbow by ER Tech. Patient was neurovascularly intact post application.   ____________________________________________   INITIAL IMPRESSION / ASSESSMENT AND PLAN / ED COURSE  46 year old female presenting to the emergency department for evaluation of right arm pain. Symptoms started after she states her IV infiltrated on 12/01/2016. Do to multiple allergies of pain medications, patient will be given Demerol IM as she states that she has never had any adverse reactions to this medication. Ultrasound to be completed to rule out DVT.  ----------------------------------------- 4:53 PM on 12/19/2016 -----------------------------------------  Ultrasound negative for DVT. Patient and spouse were advised of the  negative findings. X-ray ordered to rule out retained foreign body from IV catheter, soft tissue gas, or osteomyelitis. She states that the Demerol has "knocked the edge off of the pain." She is allergic to Tylenol and has had ibuprofen within the past 6 hours. No additional medications ordered at this time.  Ace bandage was applied for light compression prior to discharge. Patient is to follow-up with her primary care provider for symptoms that are not improving over the next few days. After checking the narcotic/controlled substance database prescription for oxycodone 5 mg was written for a 3 day supply. Patient was advised to return to the emergency department for symptoms that change or worsen if she's unable schedule an appointment with her primary care provider.   Pertinent labs & imaging results that were available during my care of the patient were reviewed by me and considered in my medical decision making (see chart for details). ____________________________________________   FINAL CLINICAL IMPRESSION(S) / ED DIAGNOSES  Final diagnoses:  Arm pain  Chinita Pester, FNP 12/20/16 1610    Emily Filbert, MD 12/21/16 [phone]

## 2016-12-19 NOTE — ED Triage Notes (Addendum)
Pt c/o right arm pain, mostly to forearm. Was here for head injury couple weeks ago and reports IV infiltrated in right hand and has had swelling/arm pain since.  Will not let RN touch RN r/t pain. Pt tearful and appears to be in lot of pain.

## 2016-12-29 ENCOUNTER — Telehealth: Payer: Self-pay | Admitting: *Deleted

## 2016-12-29 NOTE — Telephone Encounter (Addendum)
LVM for pt to call. Advised I received fax from pharmacy about one of her medications. Gave [facility] phone number.  I received fax from [facility] that pt may not be taking medication, venlafaxine. Called pharmacy where rx sent. They stated pt last filled rx 11/18/16. Asked pt to call back to clarify.

## 2017-01-04 NOTE — Telephone Encounter (Signed)
Patient called office back. States she is taking venlafaxine still but she noticed she is having a lot of joint pain since starting medication. She denies starting any other new medication recently. Advised I will send to Dr Anne HahnWillis to advise. She verbalized understanding.

## 2017-01-04 NOTE — Telephone Encounter (Signed)
Called pt again and LVM for pt to call. Advised office closing today at 12pm d/t weather. Had a question about one of her medications.

## 2017-01-04 NOTE — Telephone Encounter (Signed)
I called the patient. The patient claims that since she started Effexor, she has had a lot of joint pains. She is to stop the medication, and call me in several days. If the joint pains do not stop, we may need to investigate why she is having arthralgias. If the pain goes away, I'll start another medication such as tizanidine.

## 2017-02-09 ENCOUNTER — Emergency Department: Payer: Commercial Managed Care - PPO

## 2017-02-09 ENCOUNTER — Emergency Department
Admission: EM | Admit: 2017-02-09 | Discharge: 2017-02-09 | Disposition: A | Discharge status: Home / Self Care | Payer: Commercial Managed Care - PPO | Attending: Emergency Medicine | Admitting: Emergency Medicine

## 2017-02-09 ENCOUNTER — Encounter: Payer: Self-pay | Admitting: Emergency Medicine

## 2017-02-09 DIAGNOSIS — R079 Chest pain, unspecified: Secondary | ICD-10-CM | POA: Diagnosis present

## 2017-02-09 DIAGNOSIS — F17210 Nicotine dependence, cigarettes, uncomplicated: Secondary | ICD-10-CM | POA: Diagnosis not present

## 2017-02-09 LAB — BASIC METABOLIC PANEL
Anion gap: 9 (ref 5–15)
BUN: 9 mg/dL (ref 6–20)
CALCIUM: 9.8 mg/dL (ref 8.9–10.3)
CO2: 22 mmol/L (ref 22–32)
Chloride: 109 mmol/L (ref 101–111)
Creatinine, Ser: <0.3 mg/dL — ABNORMAL LOW (ref 0.44–1.00)
GLUCOSE: 94 mg/dL (ref 65–99)
Potassium: 3.4 mmol/L — ABNORMAL LOW (ref 3.5–5.1)
SODIUM: 140 mmol/L (ref 135–145)

## 2017-02-09 LAB — CBC
HCT: 44 % (ref 35.0–47.0)
HEMOGLOBIN: 14.2 g/dL (ref 12.0–16.0)
MCH: 25.9 pg — ABNORMAL LOW (ref 26.0–34.0)
MCHC: 32.3 g/dL (ref 32.0–36.0)
MCV: 80.2 fL (ref 80.0–100.0)
Platelets: 314 10*3/uL (ref 150–440)
RBC: 5.49 MIL/uL — ABNORMAL HIGH (ref 3.80–5.20)
RDW: 13.7 % (ref 11.5–14.5)
WBC: 19.4 10*3/uL — AB (ref 3.6–11.0)

## 2017-02-09 LAB — TROPONIN I

## 2017-02-09 LAB — FIBRIN DERIVATIVES D-DIMER (ARMC ONLY): FIBRIN DERIVATIVES D-DIMER (ARMC): 267.68 (ref 0.00–499.00)

## 2017-02-09 MED ORDER — SUCRALFATE 1 G PO TABS: 1.0000 g | ORAL_TABLET | Freq: Four times a day (QID) | ORAL | 0 refills | Status: DC

## 2017-02-09 MED ORDER — GI COCKTAIL ~~LOC~~
30.0000 mL | Freq: Once | ORAL | Status: AC
  Administered 2017-02-09: 30 mL via ORAL
  Filled 2017-02-09: qty 30

## 2017-02-09 MED ORDER — FAMOTIDINE 40 MG PO TABS: 40.0000 mg | ORAL_TABLET | Freq: Every evening | ORAL | 1 refills | Status: DC

## 2017-02-09 MED ORDER — PROCHLORPERAZINE EDISYLATE 5 MG/ML IJ SOLN
10.0000 mg | Freq: Once | INTRAMUSCULAR | Status: AC
  Administered 2017-02-09: 10 mg via INTRAVENOUS
  Filled 2017-02-09: qty 2

## 2017-02-09 NOTE — ED Provider Notes (Signed)
[facility] Emergency Department Provider Note  ____________________________________________   I have reviewed the triage vital signs and the nursing notes.   HISTORY  Chief Complaint Chest Pain   History limited by: Not Limited   HPI [name] is a 46 y.o. female who presents to the emergency department today with concerns for chest pain. Located in the left chest. Started this afternoon. She describes it as both sharp however when lying flat is more pressure-like. It is worse with lying flat. Patient does state severe. She feels like it has radiated into her arm and into her neck. Patient has not had any fevers. Denies any recent travel. No history of blood clots. Not on any estrogen containing medications.   Past Medical History:  Diagnosis Date  . Brain aneurysm   . Headache   . Hemiplegic migraine 10/07/2015  . Seizures [facility])     Patient Active Problem List   Diagnosis Date Noted  . Seizures (HCC) 10/07/2015  . Hemiplegic migraine 10/07/2015    Past Surgical History:  Procedure Laterality Date  . ABDOMINAL HYSTERECTOMY      Prior to Admission medications   Medication Sig Start Date End Date Taking? Authorizing Provider  albuterol (PROVENTIL HFA;VENTOLIN HFA) 108 (90 Base) MCG/ACT inhaler Inhale 2 puffs into the lungs every 6 (six) hours as needed for wheezing or shortness of breath. 08/23/16   Evon Slack, PA-C  lidocaine (LMX) 4 % cream Apply 1 application topically 4 (four) times daily as needed. 08/30/16   Phineas Semen, MD  ondansetron (ZOFRAN) 4 MG tablet Take 1 tablet (4 mg total) by mouth every 8 (eight) hours as needed for nausea or vomiting. 09/02/16   Jeanmarie Plant, MD  venlafaxine XR (EFFEXOR XR) 37.5 MG 24 hr capsule One in the morning and two in the evening 11/18/16   York Spaniel, MD  zonisamide [facility]) 100 MG capsule One capsule in the morning and 2 in the evening 11/18/16   York Spaniel, MD     Allergies Amoxicillin; Morphine and related; Penicillins; Rocephin [ceftriaxone sodium in dextrose]; Shellfish allergy; Tylenol [acetaminophen]; and Topamax [topiramate]  Family History  Problem Relation Age of Onset  . Stroke Mother   . Hypertension Father   . Diabetes Father   . Migraines Sister   . Migraines Brother   . Migraines Sister   . Migraines Sister   . Migraines Sister   . Migraines Sister   . Migraines Sister   . Hypertension Paternal Aunt   . Diabetes Paternal Aunt   . Stroke Paternal Aunt   . Hypertension Paternal Uncle   . Diabetes Paternal Uncle   . Stroke Paternal Uncle   . Hypertension Maternal Grandmother   . Aneurysm Paternal Grandmother   . Diabetes Paternal Grandmother   . Hypertension Paternal Grandfather   . Cancer Paternal Grandfather   . Seizures Cousin     Social History Social History  Substance Use Topics  . Smoking status: Current Every Day Smoker    Packs/day: 0.50    Types: Cigarettes  . Smokeless tobacco: Never Used  . Alcohol use 0.0 oz/week     Comment: occasionally    Review of Systems  Constitutional: Negative for fever. Cardiovascular: Positive for chest pain. Respiratory: Negative for shortness of breath. Gastrointestinal: Negative for abdominal pain, vomiting and diarrhea. Neurological: Negative for headaches, focal weakness or numbness.  10-point ROS otherwise negative.  ____________________________________________   PHYSICAL EXAM:  VITAL SIGNS: ED Triage Vitals [  02/09/17 1758]  Enc Vitals Group     BP 130/78     Pulse Rate 90     Resp 18     Temp 97 F (36.1 C)     Temp Source Oral     SpO2 100 %     Weight 194 lb (88 kg)     Height  (1.626 m)     Head Circumference      Peak Flow      Pain Score 10    Constitutional: Alert and oriented. Well appearing and in no distress. Eyes: Conjunctivae are normal. Normal extraocular movements. ENT   Head: Normocephalic and atraumatic.   Nose:  No congestion/rhinnorhea.   Mouth/Throat: Mucous membranes are moist.   Neck: No stridor. Hematological/Lymphatic/Immunilogical: No cervical lymphadenopathy. Cardiovascular: Normal rate, regular rhythm.  No murmurs, rubs, or gallops.  Respiratory: Normal respiratory effort without tachypnea nor retractions. Breath sounds are clear and equal bilaterally. No wheezes/rales/rhonchi. Gastrointestinal: Soft and non tender. No rebound. No guarding.  Genitourinary: Deferred Musculoskeletal: Normal range of motion in all extremities. No lower extremity edema. Neurologic:  Normal speech and language. No gross focal neurologic deficits are appreciated.  Skin:  Skin is warm, dry and intact. No rash noted. Psychiatric: Mood and affect are normal. Speech and behavior are normal. Patient exhibits appropriate insight and judgment.  ____________________________________________    LABS (pertinent positives/negatives)  Labs Reviewed  BASIC METABOLIC PANEL - Abnormal; Notable for the following:       Result Value   Potassium 3.4 (*)    Creatinine, Ser <0.30 (*)    All other components within normal limits  CBC - Abnormal; Notable for the following:    WBC 19.4 (*)    RBC 5.49 (*)    MCH 25.9 (*)    All other components within normal limits  TROPONIN I     ____________________________________________   EKG  I, Phineas Semen, attending physician, personally viewed and interpreted this EKG  EKG Time: 1743 Rate: 87 Rhythm: normal sinus rhythm Axis: normal Intervals: qtc 476 QRS: narrow ST changes: no st elevation Impression: normal   ____________________________________________    RADIOLOGY  CXR IMPRESSION: Normal chest   ____________________________________________   PROCEDURES  Procedures  ____________________________________________   INITIAL IMPRESSION / ASSESSMENT AND PLAN / ED COURSE  Pertinent labs & imaging results that were available during my care of  the patient were reviewed by me and considered in my medical decision making (see chart for details).  Patient here with acute onset of chest pain. 2 sets of troponin negative. D-dimer negative. Chest x-ray without any concerning findings. Patient states she did feel somewhat better after GI cocktail. Will plan on discharging with antiacid and syncope.  ____________________________________________   FINAL CLINICAL IMPRESSION(S) / ED DIAGNOSES  Final diagnoses:  Nonspecific chest pain     Note: This dictation was prepared with Dragon dictation. Any transcriptional errors that result from this process are unintentional     Phineas Semen, MD 02/09/17 2345

## 2017-02-09 NOTE — ED Triage Notes (Signed)
Pt to ed with c/o acute onset chest pain that started about 45 min pta.  Pt states sharp pain in left breast and radiating down left arm and into left jaw.  Pt tearful at triage.  Pt reports pain worse with laying down.

## 2017-02-09 NOTE — Discharge Instructions (Signed)
Please seek medical attention for any high fevers, chest pain, shortness of breath, change in behavior, persistent vomiting, bloody stool or any other new or concerning symptoms.  

## 2017-02-16 ENCOUNTER — Encounter: Payer: Self-pay | Admitting: Adult Health

## 2017-02-16 ENCOUNTER — Ambulatory Visit (INDEPENDENT_AMBULATORY_CARE_PROVIDER_SITE_OTHER): Payer: Commercial Managed Care - PPO | Admitting: Adult Health

## 2017-02-16 VITALS — BP 125/75 | HR 88 | Resp 16 | Ht 64.0 in | Wt 197.5 lb

## 2017-02-16 DIAGNOSIS — M5442 Lumbago with sciatica, left side: Secondary | ICD-10-CM

## 2017-02-16 DIAGNOSIS — G8929 Other chronic pain: Secondary | ICD-10-CM

## 2017-02-16 DIAGNOSIS — G43419 Hemiplegic migraine, intractable, without status migrainosus: Secondary | ICD-10-CM | POA: Diagnosis not present

## 2017-02-16 MED ORDER — TIZANIDINE HCL 2 MG PO CAPS: 2.0000 mg | ORAL_CAPSULE | Freq: Every day | ORAL | 5 refills | Status: DC

## 2017-02-16 NOTE — Progress Notes (Signed)
PATIENT: [name] DOB: 1970-12-15  REASON FOR VISIT: follow up HISTORY FROM: patient  HISTORY OF PRESENT ILLNESS: [name] is a 46 year old female with a history of seizures and intractable migraine headaches. She returns today for follow-up. At the last visit with Dr. Anne Hahn she was started on Effexor however she states that she had joint pain. She stopped the medication in March. She states that she continues to have joint pain however she does not want to restart Effexor that she did not find it beneficial. She states that she virtually has a headache every day. She states 1-2 times a month she will have a headache that includes left-sided weakness difficulty with speech and blurred vision. She states most of her headaches she does have photophobia and phonophobia nausea and vomiting. She has tried multiple medications in the past including gabapentin, Effexor, Zonegran, Topamax, Lyrica, Depakote, Flexeril and BC powders. Botox therapy was mentioned at the last visit however this has not been initiated. The patient also states that she has low back pain on the left that will radiate down the leg. She returns today for an evaluation.  HISTORY 11/18/16: [name] is a 46 year old right-handed black female with a history of seizures and a history of intractable migraine headache. The patient had seizure around the end of November 2017 associated with a viral illness. The patient has not had any seizures since that time. She remains on Zonegran, and she takes low-dose Depakote. The patient continues to have ongoing daily or virtually daily headaches. The patient is missing some work because of this and she feels that she may be in danger of losing her job. The patient indicates that she frequently gets left-sided numbness with her headache. She may have blurring of vision. Flickering lights and certain odors may bring on headaches. The patient has been on various medications in the past without  benefit including gabapentin, Zonegran, Topamax, Lyrica, Depakote, Flexeril, she also takes BC powders. The patient has asthma, she cannot go on beta blockers. The patient cannot take triptan medications given the hemiplegic features of her migraine. She believes that she does get weak and numb on the left side with the headaches. She only has one or 2 days a month with no headache. The patient may have significant nausea at times with the headache. She returns for an evaluation. The patient may be spacey or confused with the headache.   REVIEW OF SYSTEMS: Out of a complete 14 system review of symptoms, the patient complains only of the following symptoms, and all other reviewed systems are negative.  See history of present illness  ALLERGIES: Allergies  Allergen Reactions  . Amoxicillin Anaphylaxis  . Morphine And Related Anaphylaxis  . Penicillins Anaphylaxis      . Rocephin [Ceftriaxone Sodium In Dextrose] Anaphylaxis  . Shellfish Allergy Anaphylaxis  . Tylenol [Acetaminophen] Anaphylaxis  . Topamax [Topiramate] Nausea And Vomiting    HOME MEDICATIONS: Outpatient Medications Prior to Visit  Medication Sig Dispense Refill  . albuterol (PROVENTIL HFA;VENTOLIN HFA) 108 (90 Base) MCG/ACT inhaler Inhale 2 puffs into the lungs every 6 (six) hours as needed for wheezing or shortness of breath. 1 Inhaler 2  . famotidine (PEPCID) 40 MG tablet Take 1 tablet (40 mg total) by mouth every evening. 30 tablet 1  . lidocaine (LMX) 4 % cream Apply 1 application topically 4 (four) times daily as needed. 30 g 0  . ondansetron (ZOFRAN) 4 MG tablet Take 1 tablet (4 mg total) by  mouth every 8 (eight) hours as needed for nausea or vomiting. 8 tablet 0  . sucralfate (CARAFATE) 1 g tablet Take 1 tablet (1 g total) by mouth 4 (four) times daily. 60 tablet 0  . zonisamide (ZONEGRAN) 100 MG capsule One capsule in the morning and 2 in the evening 270 capsule 3  . venlafaxine XR (EFFEXOR XR) 37.5 MG 24 hr capsule  One in the morning and two in the evening (Patient not taking: Reported on 02/16/2017) 90 capsule 2   No facility-administered medications prior to visit.     PAST MEDICAL HISTORY: Past Medical History:  Diagnosis Date  . Brain aneurysm   . Headache   . Hemiplegic migraine 10/07/2015  . Seizures (HCC)     PAST SURGICAL HISTORY: Past Surgical History:  Procedure Laterality Date  . ABDOMINAL HYSTERECTOMY      FAMILY HISTORY: Family History  Problem Relation Age of Onset  . Stroke Mother   . Hypertension Father   . Diabetes Father   . Migraines Sister   . Migraines Brother   . Migraines Sister   . Migraines Sister   . Migraines Sister   . Migraines Sister   . Migraines Sister   . Hypertension Paternal Aunt   . Diabetes Paternal Aunt   . Stroke Paternal Aunt   . Hypertension Paternal Uncle   . Diabetes Paternal Uncle   . Stroke Paternal Uncle   . Hypertension Maternal Grandmother   . Aneurysm Paternal Grandmother   . Diabetes Paternal Grandmother   . Hypertension Paternal Grandfather   . Cancer Paternal Grandfather   . Seizures Cousin     SOCIAL HISTORY: Social History   Social History  . Marital status: Married    Spouse name: N/A  . Number of children: 3  . Years of education: 41   Occupational History  . [employer]    Social History Main Topics  . Smoking status: Current Every Day Smoker    Packs/day: 0.50    Types: Cigarettes  . Smokeless tobacco: Never Used  . Alcohol use 0.0 oz/week     Comment: occasionally  . Drug use: No  . Sexual activity: Not on file   Other Topics Concern  . Not on file   Social History Narrative   Patient drink about 2 cups of caffeine daily.   Patient is right handed.       PHYSICAL EXAM  Vitals:   02/16/17 1017  BP: 125/75  Pulse: 88  Resp: 16  Weight: 197 lb 8 oz (89.6 kg)  Height:  (1.626 m)   Body mass index is 33.9 kg/m.  Generalized: Well developed, in no acute distress    Neurological examination  Mentation: Alert oriented to time, place, history taking. Follows all commands speech and language fluent Cranial nerve II-XII: Pupils were equal round reactive to light. Extraocular movements were full, visual field were full on confrontational test. Facial sensation and strength were normal. Uvula tongue midline. Head turning and shoulder shrug  were normal and symmetric. Motor: The motor testing reveals 5 over 5 strength of all 4 extremities. Good symmetric motor tone is noted throughout.  Sensory: Sensory testing is intact to soft touch on all 4 extremities. No evidence of extinction is noted.  Coordination: Cerebellar testing reveals good finger-nose-finger and heel-to-shin bilaterally.  Gait and station: Gait is normal. Tandem gait is Unsteady. Romberg is negative. No drift is seen.  Reflexes: Deep tendon reflexes are symmetric and normal bilaterally.  DIAGNOSTIC DATA (LABS, IMAGING, TESTING) - I reviewed patient records, labs, notes, testing and imaging myself where available.  Lab Results  Component Value Date   WBC 19.4 (H) 02/09/2017   HGB 14.2 02/09/2017   HCT 44.0 02/09/2017   MCV 80.2 02/09/2017   PLT 314 02/09/2017      Component Value Date/Time   NA 140 02/09/2017 1803   NA 145 08/29/2014 1556   K 3.4 (L) 02/09/2017 1803   K 4.0 08/29/2014 1556   CL 109 02/09/2017 1803   CL 113 (H) 08/29/2014 1556   CO2 22 02/09/2017 1803   CO2 24 08/29/2014 1556   GLUCOSE 94 02/09/2017 1803   GLUCOSE 87 08/29/2014 1556   BUN 9 02/09/2017 1803   BUN 9 08/29/2014 1556   CREATININE <0.30 (L) 02/09/2017 1803   CREATININE 0.74 08/29/2014 1556   CALCIUM 9.8 02/09/2017 1803   CALCIUM 8.5 08/29/2014 1556   PROT 7.8 10/04/2016 1709   PROT 7.9 02/01/2014 1605   ALBUMIN 4.0 10/04/2016 1709   ALBUMIN 4.0 02/01/2014 1605   AST 23 10/04/2016 1709   AST 20 02/01/2014 1605   ALT 18 10/04/2016 1709   ALT 30 02/01/2014 1605   ALKPHOS 61 10/04/2016 1709    ALKPHOS 74 02/01/2014 1605   BILITOT 0.5 10/04/2016 1709   BILITOT 0.4 02/01/2014 1605   GFRNONAA NOT CALCULATED 02/09/2017 1803   GFRNONAA >60 08/29/2014 1556   GFRNONAA >60 02/01/2014 1605   GFRAA NOT CALCULATED 02/09/2017 1803   GFRAA >60 08/29/2014 1556   GFRAA >60 02/01/2014 1605      ASSESSMENT AND PLAN 46 y.o. year old female  has a past medical history of Brain aneurysm; Headache; Hemiplegic migraine (10/07/2015); and Seizures (HCC). here with:  1. Hemiplegic migraine 2. Low back pain  The patient continues to have daily headaches. We will try tizanidine 2 mg at bedtime. I have reviewed side effects of tizanidine with the patient. We will also get patient set up for Botox therapy. She is encouraged to follow-up with her primary care provider in regards to joint pain. Tizanidine may actually help her low back pain however if it is not beneficial she should let us know. She will follow-up in 6 months or sooner if needed.      Butch Penny, MSN, NP-C 02/16/2017, 10:30 AM [facility] [address], [address] [place], [place] [address] [phone]

## 2017-02-16 NOTE — Patient Instructions (Addendum)
Start Tizanidine 2 mg at bedtime Continue Zonegran for seizures Will get you setup for botox If your symptoms worsen or you develop new symptoms please let us know.   Tizanidine tablets or capsules What is this medicine? TIZANIDINE (tye ZAN i deen) helps to relieve muscle spasms. It may be used to help in the treatment of multiple sclerosis and spinal cord injury. This medicine may be used for other purposes; ask your health care provider or pharmacist if you have questions. COMMON BRAND NAME(S): Zanaflex What should I tell my health care provider before I take this medicine? They need to know if you have any of these conditions: -kidney disease -liver disease -low blood pressure -mental disorder -an unusual or allergic reaction to tizanidine, other medicines, lactose (tablets only), foods, dyes, or preservatives -pregnant or trying to get pregnant -breast-feeding How should I use this medicine? Take this medicine by mouth with a full glass of water. Take this medicine on an empty stomach, at least 30 minutes before or 2 hours after food. Do not take with food unless you talk with your doctor. Follow the directions on the prescription label. Take your medicine at regular intervals. Do not take your medicine more often than directed. Do not stop taking except on your doctor's advice. Suddenly stopping the medicine can be very dangerous. Talk to your pediatrician regarding the use of this medicine in children. Patients over 53 years old may have a stronger reaction and need a smaller dose. Overdosage: If you think you have taken too much of this medicine contact a poison control center or emergency room at once. NOTE: This medicine is only for you. Do not share this medicine with others. What if I miss a dose? If you miss a dose, take it as soon as you can. If it is almost time for your next dose, take only that dose. Do not take double or extra doses. What may interact with this  medicine? Do not take this medicine with any of the following medications: -ciprofloxacin -cisapride -dofetilide -dronedarone -fluvoxamine -narcotic medicines for cough -pimozide -thiabendazole -thioridazine -ziprasidone This medicine may also interact with the following medications: -acyclovir -alcohol -antihistamines for allergy, cough and cold -baclofen -certain antibiotics like levofloxacin, ofloxacin -certain medicines for anxiety or sleep -certain medicines for blood pressure, heart disease, irregular heart beat -certain medicines for depression like amitriptyline, fluoxetine, sertraline -certain medicines for seizures like phenobarbital, primidone -certain medicines for stomach problems like cimetidine, famotidine -female hormones, like estrogens or progestins and birth control pills, patches, rings, or injections -general anesthetics like halothane, isoflurane, methoxyflurane, propofol -local anesthetics like lidocaine, pramoxine, tetracaine -medicines that relax muscles for surgery -narcotic medicines for pain -other medicines that prolong the QT interval (cause an abnormal heart rhythm) -phenothiazines like chlorpromazine, mesoridazine, prochlorperazine -ticlopidine -zileuton This list may not describe all possible interactions. Give your health care provider a list of all the medicines, herbs, non-prescription drugs, or dietary supplements you use. Also tell them if you smoke, drink alcohol, or use illegal drugs. Some items may interact with your medicine. What should I watch for while using this medicine? Tell your doctor or health care professional if your symptoms do not start to get better or if they get worse. You may get drowsy or dizzy. Do not drive, use machinery, or do anything that needs mental alertness until you know how this medicine affects you. Do not stand or sit up quickly, especially if you are an older patient. This reduces the risk of dizzy  or  fainting spells. Alcohol may interfere with the effect of this medicine. Avoid alcoholic drinks. If you are taking another medicine that also causes drowsiness, you may have more side effects. Give your health care provider a list of all medicines you use. Your doctor will tell you how much medicine to take. Do not take more medicine than directed. Call emergency for help if you have problems breathing or unusual sleepiness. Your mouth may get dry. Chewing sugarless gum or sucking hard candy, and drinking plenty of water may help. Contact your doctor if the problem does not go away or is severe. What side effects may I notice from receiving this medicine? Side effects that you should report to your doctor or health care professional as soon as possible: -allergic reactions like skin rash, itching or hives, swelling of the face, lips, or tongue -breathing problems -hallucinations -signs and symptoms of liver injury like dark yellow or brown urine; general ill feeling or flu-like symptoms; light-colored stools; loss of appetite; nausea; right upper quadrant belly pain; unusually weak or tired; yellowing of the eyes or skin -signs and symptoms of low blood pressure like dizziness; feeling faint or lightheaded, falls; unusually weak or tired -unusually slow heartbeat -unusually weak or tired Side effects that usually do not require medical attention (report to your doctor or health care professional if they continue or are bothersome): -blurred vision -constipation -dizziness -dry mouth -tiredness This list may not describe all possible side effects. Call your doctor for medical advice about side effects. You may report side effects to FDA at 1-800-FDA-1088. Where should I keep my medicine? Keep out of the reach of children. Store at room temperature between 15 and 30 degrees C (59 and 86 degrees F). Throw away any unused medicine after the expiration date. NOTE: This sheet is a summary. It may not  cover all possible information. If you have questions about this medicine, talk to your doctor, pharmacist, or health care provider.  2018 Elsevier/Gold Standard (2015-07-23 13:52:12)

## 2017-02-16 NOTE — Progress Notes (Signed)
I have read the note, and I agree with the clinical assessment and plan.  Yasmeen Manka KEITH   

## 2017-03-01 ENCOUNTER — Telehealth: Payer: Self-pay | Admitting: Neurology

## 2017-03-01 NOTE — Telephone Encounter (Signed)
With the pending authorization, should I still call and confirm their apt?

## 2017-03-02 NOTE — Telephone Encounter (Signed)
It has been approved, thank you for asking!!!

## 2017-03-03 ENCOUNTER — Encounter: Payer: Self-pay | Admitting: Neurology

## 2017-03-03 ENCOUNTER — Ambulatory Visit (INDEPENDENT_AMBULATORY_CARE_PROVIDER_SITE_OTHER): Payer: Commercial Managed Care - PPO | Admitting: Neurology

## 2017-03-03 VITALS — BP 125/70 | HR 70 | Ht 64.0 in | Wt 193.0 lb

## 2017-03-03 DIAGNOSIS — G43019 Migraine without aura, intractable, without status migrainosus: Secondary | ICD-10-CM | POA: Diagnosis not present

## 2017-03-03 DIAGNOSIS — G43419 Hemiplegic migraine, intractable, without status migrainosus: Secondary | ICD-10-CM

## 2017-03-03 HISTORY — DX: Migraine without aura, intractable, without status migrainosus: G43.019

## 2017-03-03 MED ORDER — ONABOTULINUMTOXINA 100 UNITS IJ SOLR
200.0000 [IU] | Freq: Once | INTRAMUSCULAR | Status: AC
  Administered 2017-03-03: 200 [IU] via INTRAMUSCULAR

## 2017-03-03 NOTE — Procedures (Signed)
     BOTOX PROCEDURE NOTE FOR MIGRAINE HEADACHE   HISTORY: [name] is a 46 year old patient with a several year history of chronic daily headaches associated with nausea and occasionally associated with hemiplegic features. The patient has not responded to multiple medications in the past, she is coming in today for her first Botox therapy for migraine. Her headaches remain severe and daily.   Description of procedure:  The patient was placed in a sitting position. The standard protocol was used for Botox as follows, with 5 units of Botox injected at each site:   -Procerus muscle, midline injection  -Corrugator muscle, bilateral injection  -Frontalis muscle, bilateral injection, with 2 sites each side, medial injection was performed in the upper one third of the frontalis muscle, in the region vertical from the medial inferior edge of the superior orbital rim. The lateral injection was again in the upper one third of the forehead vertically above the lateral limbus of the cornea, 1.5 cm lateral to the medial injection site.  -Temporalis muscle injection, 4 sites, bilaterally. The first injection was 3 cm above the tragus of the ear, second injection site was 1.5 cm to 3 cm up from the first injection site in line with the tragus of the ear. The third injection site was 1.5-3 cm forward between the first 2 injection sites. The fourth injection site was 1.5 cm posterior to the second injection site.  -Occipitalis muscle injection, 3 sites, bilaterally. The first injection was done one half way between the occipital protuberance and the tip of the mastoid process behind the ear. The second injection site was done lateral and superior to the first, 1 fingerbreadth from the first injection. The third injection site was 1 fingerbreadth superiorly and medially from the first injection site.  -Cervical paraspinal muscle injection, 2 sites, bilateral, the first injection site was 1 cm from the  midline of the cervical spine, 3 cm inferior to the lower border of the occipital protuberance. The second injection site was 1.5 cm superiorly and laterally to the first injection site.  -Trapezius muscle injection was performed at 3 sites, bilaterally. The first injection site was in the upper trapezius muscle halfway between the inflection point of the neck, and the acromion. The second injection site was one half way between the acromion and the first injection site. The third injection was done between the first injection site and the inflection point of the neck.   A 200 unit bottle of Botox was used, 155 units were injected, the rest of the Botox was wasted. The patient tolerated the procedure well, there were no complications of the above procedure.  Botox NDC [id number] Lot number [id number] Expiration date 09/2019

## 2017-03-03 NOTE — Progress Notes (Signed)
Please refer to Botox procedure note. 

## 2017-06-10 ENCOUNTER — Ambulatory Visit (INDEPENDENT_AMBULATORY_CARE_PROVIDER_SITE_OTHER): Payer: Commercial Managed Care - PPO | Admitting: Neurology

## 2017-06-10 ENCOUNTER — Other Ambulatory Visit: Payer: Self-pay | Admitting: Neurology

## 2017-06-10 ENCOUNTER — Encounter: Payer: Self-pay | Admitting: Neurology

## 2017-06-10 VITALS — BP 130/81 | HR 80 | Ht 64.0 in

## 2017-06-10 DIAGNOSIS — G43019 Migraine without aura, intractable, without status migrainosus: Secondary | ICD-10-CM | POA: Diagnosis not present

## 2017-06-10 MED ORDER — DEXAMETHASONE 2 MG PO TABS: ORAL_TABLET | ORAL | 0 refills | Status: DC

## 2017-06-10 MED ORDER — ONABOTULINUMTOXINA 100 UNITS IJ SOLR
200.0000 [IU] | Freq: Once | INTRAMUSCULAR | Status: AC
  Administered 2017-06-10: 200 [IU] via INTRAMUSCULAR

## 2017-06-10 NOTE — Progress Notes (Signed)
Please refer to Botox procedure note. 

## 2017-06-10 NOTE — Procedures (Signed)
     BOTOX PROCEDURE NOTE FOR MIGRAINE HEADACHE   HISTORY: [name] is a 46 year old patient with a history of intractable migraine headaches. She has been having daily headaches that have not responded whatsoever to medical therapy. She had her first Botox treatment 3 months ago, she returns for her second therapy. She indicates that she is now averaging about 10 headache free days a month. The headaches when she does get them are less severe, but within the last several weeks prior to her next Botox injection the headaches have become much more severe. She returns for her therapy.   Description of procedure:  The patient was placed in a sitting position. The standard protocol was used for Botox as follows, with 5 units of Botox injected at each site:   -Procerus muscle, midline injection  -Corrugator muscle, bilateral injection  -Frontalis muscle, bilateral injection, with 2 sites each side, medial injection was performed in the upper one third of the frontalis muscle, in the region vertical from the medial inferior edge of the superior orbital rim. The lateral injection was again in the upper one third of the forehead vertically above the lateral limbus of the cornea, 1.5 cm lateral to the medial injection site.  -Temporalis muscle injection, 4 sites, bilaterally. The first injection was 3 cm above the tragus of the ear, second injection site was 1.5 cm to 3 cm up from the first injection site in line with the tragus of the ear. The third injection site was 1.5-3 cm forward between the first 2 injection sites. The fourth injection site was 1.5 cm posterior to the second injection site.  -Occipitalis muscle injection, 3 sites, bilaterally. The first injection was done one half way between the occipital protuberance and the tip of the mastoid process behind the ear. The second injection site was done lateral and superior to the first, 1 fingerbreadth from the first injection. The third  injection site was 1 fingerbreadth superiorly and medially from the first injection site.  -Cervical paraspinal muscle injection, 2 sites, bilateral, the first injection site was 1 cm from the midline of the cervical spine, 3 cm inferior to the lower border of the occipital protuberance. The second injection site was 1.5 cm superiorly and laterally to the first injection site.  -Trapezius muscle injection was performed at 3 sites, bilaterally. The first injection site was in the upper trapezius muscle halfway between the inflection point of the neck, and the acromion. The second injection site was one half way between the acromion and the first injection site. The third injection was done between the first injection site and the inflection point of the neck.   A 200 unit bottle of Botox was used, 155 units were injected, the rest of the Botox was wasted. The patient tolerated the procedure well, there were no complications of the above procedure.  Botox NDC [id number] Lot number [id number] Expiration date March 2021

## 2017-08-11 ENCOUNTER — Ambulatory Visit (INDEPENDENT_AMBULATORY_CARE_PROVIDER_SITE_OTHER): Payer: Commercial Managed Care - PPO | Admitting: Adult Health

## 2017-08-11 ENCOUNTER — Encounter: Payer: Self-pay | Admitting: Adult Health

## 2017-08-11 ENCOUNTER — Encounter (INDEPENDENT_AMBULATORY_CARE_PROVIDER_SITE_OTHER): Payer: Self-pay

## 2017-08-11 VITALS — BP 133/82 | HR 81 | Ht 64.0 in | Wt 194.8 lb

## 2017-08-11 DIAGNOSIS — G43011 Migraine without aura, intractable, with status migrainosus: Secondary | ICD-10-CM | POA: Diagnosis not present

## 2017-08-11 MED ORDER — PREDNISONE 5 MG PO TABS: ORAL_TABLET | ORAL | 0 refills | Status: DC

## 2017-08-11 NOTE — Progress Notes (Signed)
PATIENT: [name] DOB: [birth year]  REASON FOR VISIT: follow up HISTORY FROM: patient  HISTORY OF PRESENT ILLNESS: Today 08/11/17 [name] is a 46 year old female with a history of seizures and intractable migraine headaches. She returns today for follow-up. She is currently on tizanidine 2 mg at bedtime as well as zonegran 100 mg in the morning and 200 mg in the evening. She is also had 2 rounds of Botox injections. She reports that the first round of injections she did see a decrease in her severity but not necessarily the frequency of headaches. She states after the second round of Botox she has not really noticed any change in her headache patterns. She states that she is essentially have a dull headache every day. She states that there are other times that she has a severe headache. She reports that her headaches location varies. She does have photophobia, phonophobia, nausea and vomiting. She states with more severe headache she will develop weakness on the left side as well as stuttering speech. On occasion she will also developed blurred vision or double vision. She reports that she had a severe headache that started yesterday. She continues to have this headache today. She rates her pain on a scale of 1-10 a 7. She reports that with this headache she has experienced some weakness on the left side as well as difficulty with her speech. She reports that these are not new symptoms but rather typical for her migraines. She states in the past she has had IV infusion of Depakote and Solu-Medrol but states that it made her very jittery afterwards. She reports that she had a prednisone Dosepak for and tolerated it well. She returns today for an evaluation.  HISTORY [name] is a 46 year old female with a history of seizures and intractable migraine headaches. She returns today for follow-up. At the last visit with Dr. Anne Hahn she was started on Effexor however she states that she had joint  pain. She stopped the medication in March. She states that she continues to have joint pain however she does not want to restart Effexor that she did not find it beneficial. She states that she virtually has a headache every day. She states 1-2 times a month she will have a headache that includes left-sided weakness difficulty with speech and blurred vision. She states most of her headaches she does have photophobia and phonophobia nausea and vomiting. She has tried multiple medications in the past including gabapentin, Effexor, Zonegran, Topamax, Lyrica, Depakote, Flexeril and BC powders. Botox therapy was mentioned at the last visit however this has not been initiated. The patient also states that she has low back pain on the left that will radiate down the leg. She returns today for an evaluation.   REVIEW OF SYSTEMS: Out of a complete 14 system review of symptoms, the patient complains only of the following symptoms, and all other reviewed systems are negative.  See HPI   ALLERGIES: Allergies  Allergen Reactions  . Amoxicillin Anaphylaxis  . Morphine And Related Anaphylaxis  . Penicillins Anaphylaxis    Has patient had a PCN reaction causing immediate rash, facial/tongue/throat swelling, SOB or lightheadedness with hypotension: Yes Has patient had a PCN reaction causing severe rash involving mucus membranes or skin necrosis: Yes Has patient had a PCN reaction that required hospitalization Yes Has patient had a PCN reaction occurring within the last 10 years: Yes If all of the above answers are "NO", then may proceed with Cephalosporin use.   Marland Kitchen  Rocephin [Ceftriaxone Sodium In Dextrose] Anaphylaxis  . Shellfish Allergy Anaphylaxis  . Tylenol [Acetaminophen] Anaphylaxis  . Topamax [Topiramate] Nausea And Vomiting    HOME MEDICATIONS: Outpatient Medications Prior to Visit  Medication Sig Dispense Refill  . albuterol (PROVENTIL HFA;VENTOLIN HFA) 108 (90 Base) MCG/ACT inhaler Inhale 2  puffs into the lungs every 6 (six) hours as needed for wheezing or shortness of breath. 1 Inhaler 2  . famotidine (PEPCID) 40 MG tablet Take 1 tablet (40 mg total) by mouth every evening. 30 tablet 1  . lidocaine (LMX) 4 % cream Apply 1 application topically 4 (four) times daily as needed. 30 g 0  . ondansetron (ZOFRAN) 4 MG tablet Take 1 tablet (4 mg total) by mouth every 8 (eight) hours as needed for nausea or vomiting. 8 tablet 0  . sucralfate (CARAFATE) 1 g tablet Take 1 tablet (1 g total) by mouth 4 (four) times daily. 60 tablet 0  . tizanidine (ZANAFLEX) 2 MG capsule Take 1 capsule (2 mg total) by mouth at bedtime. 30 capsule 5  . zonisamide (ZONEGRAN) 100 MG capsule One capsule in the morning and 2 in the evening 270 capsule 3  . dexamethasone (DECADRON) 2 MG tablet Take 3 tablets the first day, 2 the second and 1 the third day (Patient not taking: Reported on 08/11/2017) 6 tablet 0   No facility-administered medications prior to visit.     PAST MEDICAL HISTORY: Past Medical History:  Diagnosis Date  . Brain aneurysm   . Common migraine with intractable migraine 03/03/2017  . Headache   . Hemiplegic migraine 10/07/2015  . Seizures (HCC)     PAST SURGICAL HISTORY: Past Surgical History:  Procedure Laterality Date  . ABDOMINAL HYSTERECTOMY      FAMILY HISTORY: Family History  Problem Relation Age of Onset  . Stroke Mother   . Hypertension Father   . Diabetes Father   . Migraines Sister   . Migraines Brother   . Migraines Sister   . Migraines Sister   . Migraines Sister   . Migraines Sister   . Migraines Sister   . Hypertension Paternal Aunt   . Diabetes Paternal Aunt   . Stroke Paternal Aunt   . Hypertension Paternal Uncle   . Diabetes Paternal Uncle   . Stroke Paternal Uncle   . Hypertension Maternal Grandmother   . Aneurysm Paternal Grandmother   . Diabetes Paternal Grandmother   . Hypertension Paternal Grandfather   . Cancer Paternal Grandfather   . Seizures  Cousin     SOCIAL HISTORY: Social History   Social History  . Marital status: Married    Spouse name: N/A  . Number of children: 3  . Years of education: 38   Occupational History  .  Healthcare    Social History Main Topics  . Smoking status: Current Every Day Smoker    Packs/day: 0.50    Types: Cigarettes  . Smokeless tobacco: Never Used  . Alcohol use 0.0 oz/week     Comment: occasionally  . Drug use: No  . Sexual activity: Not on file   Other Topics Concern  . Not on file   Social History Narrative   Patient drink about 2 cups of caffeine daily.   Patient is right handed.       PHYSICAL EXAM  Vitals:   08/11/17 0856  BP: 133/82  Pulse: 81  Weight: 194 lb 12.8 oz (88.4 kg)  Height: 5\' 4"  (1.626 m)   Body mass index  is 33.44 kg/m.  Generalized: Well developed, in no acute distress   Neurological examination  Mentation: Alert oriented to time, place, history taking. Mild dysarthria noted. Cranial nerve II-XII: Pupils were equal round reactive to light. Extraocular movements were full, visual field were full on confrontational test. Facial sensation and strength were normal. Uvula tongue midline. Head turning and shoulder shrug  were normal and symmetric. Motor: Good strength throughout with the exception of mild weakness in the left lower shin me. Sensory: Sensory testing is intact to soft touch on all 4 extremities. No evidence of extinction is noted.  Coordination: Cerebellar testing reveals good finger-nose-finger and heel-to-shin bilaterally.  Gait and station: Gait is normal. Tandem gait is normal. Romberg is negative. No drift is seen.  Reflexes: Deep tendon reflexes are symmetric and normal bilaterally.   DIAGNOSTIC DATA (LABS, IMAGING, TESTING) - I reviewed patient records, labs, notes, testing and imaging myself where available.  Lab Results  Component Value Date   WBC 19.4 (H) 02/09/2017   HGB 14.2 02/09/2017   HCT 44.0 02/09/2017    MCV 80.2 02/09/2017   PLT 314 02/09/2017      Component Value Date/Time   NA 140 02/09/2017 1803   NA 145 08/29/2014 1556   K 3.4 (L) 02/09/2017 1803   K 4.0 08/29/2014 1556   CL 109 02/09/2017 1803   CL 113 (H) 08/29/2014 1556   CO2 22 02/09/2017 1803   CO2 24 08/29/2014 1556   GLUCOSE 94 02/09/2017 1803   GLUCOSE 87 08/29/2014 1556   BUN 9 02/09/2017 1803   BUN 9 08/29/2014 1556   CREATININE <0.30 (L) 02/09/2017 1803   CREATININE 0.74 08/29/2014 1556   CALCIUM 9.8 02/09/2017 1803   CALCIUM 8.5 08/29/2014 1556   PROT 7.8 10/04/2016 1709   PROT 7.9 02/01/2014 1605   ALBUMIN 4.0 10/04/2016 1709   ALBUMIN 4.0 02/01/2014 1605   AST 23 10/04/2016 1709   AST 20 02/01/2014 1605   ALT 18 10/04/2016 1709   ALT 30 02/01/2014 1605   ALKPHOS 61 10/04/2016 1709   ALKPHOS 74 02/01/2014 1605   BILITOT 0.5 10/04/2016 1709   BILITOT 0.4 02/01/2014 1605   GFRNONAA NOT CALCULATED 02/09/2017 1803   GFRNONAA >60 08/29/2014 1556   GFRNONAA >60 02/01/2014 1605   GFRAA NOT CALCULATED 02/09/2017 1803   GFRAA >60 08/29/2014 1556   GFRAA >60 02/01/2014 1605      ASSESSMENT AND PLAN 46 y.o. year old female  has a past medical history of Brain aneurysm; Common migraine with intractable migraine (03/03/2017); Headache; Hemiplegic migraine (10/07/2015); and Seizures (HCC). here with:   1. Migraine headache  The patient will continue on tizanidine and Zonegran. She will get another round of Botox next month. I have advised that if the third round of Botox is not beneficial for her migraines we may have to consider other options such as Ajovy or Aimovig. Patient verbalized understanding. She will be given a prednisone Dosepak today to help with her current headache. The patient refused an IV infusion for migraine today. She is advised that if she develops any concerning symptoms associated with her headaches that are typically not present  she should consider going to the emergency room. Patient voices  understanding. She will follow-up in 6 months or sooner if needed.     Butch PennyMegan Roark Rufo, MSN, NP-C 08/11/2017, 9:09 AM [facility] [address], [address] [place], [place] [address] [phone]

## 2017-08-11 NOTE — Patient Instructions (Addendum)
Your Plan:  Continue Zonegran, Topamax and Botox Prednisone dose pack today for current headache If your symptoms worsen or you develop new symptoms please let us know.   To prevent or relieve headaches, try the following: Cool Compress. Lie down and place a cool compress on your head.  Avoid headache triggers. If certain foods or odors seem to have triggered your migraines in the past, avoid them. A headache diary might help you identify triggers.  Include physical activity in your daily routine. Try a daily walk or other moderate aerobic exercise.  Manage stress. Find healthy ways to cope with the stressors, such as delegating tasks on your to-do list.  Practice relaxation techniques. Try deep breathing, yoga, massage and visualization.  Eat regularly. Eating regularly scheduled meals and maintaining a healthy diet might help prevent headaches. Also, drink plenty of fluids.  Follow a regular sleep schedule. Sleep deprivation might contribute to headaches Consider biofeedback. With this mind-body technique, you learn to control certain bodily functions - such as muscle tension, heart rate and blood pressure - to prevent headaches or reduce headache pain.    Proceed to emergency room if you experience new or worsening symptoms or symptoms do not resolve, if you have new neurologic symptoms or if headache is severe, or for any concerning symptom    Thank you for coming to see [place] at [facility]. I hope we have been able to provide you high quality care today.  You may receive a patient satisfaction survey over the next few weeks. We would appreciate your feedback and comments so that we may continue to improve ourselves and the health of our patients.  Prednisone tablets What is this medicine? PREDNISONE (PRED ni sone) is a corticosteroid. It is commonly used to treat inflammation of the skin, joints, lungs, and other organs. Common conditions treated include asthma,  allergies, and arthritis. It is also used for other conditions, such as blood disorders and diseases of the adrenal glands. This medicine may be used for other purposes; ask your health care provider or pharmacist if you have questions. COMMON BRAND NAME(S): Deltasone, Predone, Sterapred, Sterapred DS What should I tell my health care provider before I take this medicine? They need to know if you have any of these conditions: -Cushing's syndrome -diabetes -glaucoma -heart disease -high blood pressure -infection (especially a virus infection such as chickenpox, cold sores, or herpes) -kidney disease -liver disease -mental illness -myasthenia gravis -osteoporosis -seizures -stomach or intestine problems -thyroid disease -an unusual or allergic reaction to lactose, prednisone, other medicines, foods, dyes, or preservatives -pregnant or trying to get pregnant -breast-feeding How should I use this medicine? Take this medicine by mouth with a glass of water. Follow the directions on the prescription label. Take this medicine with food. If you are taking this medicine once a day, take it in the morning. Do not take more medicine than you are told to take. Do not suddenly stop taking your medicine because you may develop a severe reaction. Your doctor will tell you how much medicine to take. If your doctor wants you to stop the medicine, the dose may be slowly lowered over time to avoid any side effects. Talk to your pediatrician regarding the use of this medicine in children. Special care may be needed. Overdosage: If you think you have taken too much of this medicine contact a poison control center or emergency room at once. NOTE: This medicine is only for you. Do not share this medicine with  others. What if I miss a dose? If you miss a dose, take it as soon as you can. If it is almost time for your next dose, talk to your doctor or health care professional. You may need to miss a dose or take  an extra dose. Do not take double or extra doses without advice. What may interact with this medicine? Do not take this medicine with any of the following medications: -metyrapone -mifepristone This medicine may also interact with the following medications: -aminoglutethimide -amphotericin B -aspirin and aspirin-like medicines -barbiturates -certain medicines for diabetes, like glipizide or glyburide -cholestyramine -cholinesterase inhibitors -cyclosporine -digoxin -diuretics -ephedrine -female hormones, like estrogens and birth control pills -isoniazid -ketoconazole -NSAIDS, medicines for pain and inflammation, like ibuprofen or naproxen -phenytoin -rifampin -toxoids -vaccines -warfarin This list may not describe all possible interactions. Give your health care provider a list of all the medicines, herbs, non-prescription drugs, or dietary supplements you use. Also tell them if you smoke, drink alcohol, or use illegal drugs. Some items may interact with your medicine. What should I watch for while using this medicine? Visit your doctor or health care professional for regular checks on your progress. If you are taking this medicine over a prolonged period, carry an identification card with your name and address, the type and dose of your medicine, and your doctor's name and address. This medicine may increase your risk of getting an infection. Tell your doctor or health care professional if you are around anyone with measles or chickenpox, or if you develop sores or blisters that do not heal properly. If you are going to have surgery, tell your doctor or health care professional that you have taken this medicine within the last twelve months. Ask your doctor or health care professional about your diet. You may need to lower the amount of salt you eat. This medicine may affect blood sugar levels. If you have diabetes, check with your doctor or health care professional before you change  your diet or the dose of your diabetic medicine. What side effects may I notice from receiving this medicine? Side effects that you should report to your doctor or health care professional as soon as possible: -allergic reactions like skin rash, itching or hives, swelling of the face, lips, or tongue -changes in emotions or moods -changes in vision -depressed mood -eye pain -fever or chills, cough, sore throat, pain or difficulty passing urine -increased thirst -swelling of ankles, feet Side effects that usually do not require medical attention (report to your doctor or health care professional if they continue or are bothersome): -confusion, excitement, restlessness -headache -nausea, vomiting -skin problems, acne, thin and shiny skin -trouble sleeping -weight gain This list may not describe all possible side effects. Call your doctor for medical advice about side effects. You may report side effects to FDA at 1-800-FDA-1088. Where should I keep my medicine? Keep out of the reach of children. Store at room temperature between 15 and 30 degrees C (59 and 86 degrees F). Protect from light. Keep container tightly closed. Throw away any unused medicine after the expiration date. NOTE: This sheet is a summary. It may not cover all possible information. If you have questions about this medicine, talk to your doctor, pharmacist, or health care provider.  2018 [employer]vier/Gold Standard (2011-05-28 10:57:14)

## 2017-08-11 NOTE — Progress Notes (Signed)
I have read the note, and I agree with the clinical assessment and plan.  [name] KEITH   

## 2017-09-14 ENCOUNTER — Telehealth: Payer: Self-pay | Admitting: Neurology

## 2017-09-14 MED ORDER — NONFORMULARY OR COMPOUNDED ITEM: 5 refills | Status: DC

## 2017-09-14 NOTE — Telephone Encounter (Signed)
Ajovy ordered.  

## 2017-09-14 NOTE — Telephone Encounter (Signed)
I spoke with the patient today, we canceled her botox injections. She reports that she has not experienced any relief since starting botox. She says that they discussed her possibly starting another medication instead, (possibly aimovig). She would like to speak with the nurse regarding this, please call and advise.

## 2017-09-14 NOTE — Telephone Encounter (Signed)
Called patient and informed her that NP ordered Ajovy, and [facility]. Advised her there is a savings card she must have. Advised if able, will fax to her pharmacy. Otherwise this RN will mail it to her. She verbalized understanding, appreciation.  Information to large to fax; will mail to patient today for her to take to her pharmacy.

## 2017-09-15 ENCOUNTER — Ambulatory Visit: Payer: Commercial Managed Care - PPO | Admitting: Neurology

## 2017-10-12 NOTE — Telephone Encounter (Signed)
Letter received from the patients insurance regarding request for authorization on botox procedure code [id number]. Per previous phone note this patient no longer takes botox. Authorization request was not completed because the patient is no longer on the medication.

## 2017-11-30 ENCOUNTER — Other Ambulatory Visit: Payer: Self-pay

## 2017-11-30 ENCOUNTER — Encounter: Payer: Self-pay | Admitting: Emergency Medicine

## 2017-11-30 ENCOUNTER — Emergency Department: Payer: Commercial Managed Care - PPO

## 2017-11-30 ENCOUNTER — Emergency Department
Admission: EM | Admit: 2017-11-30 | Discharge: 2017-11-30 | Disposition: A | Discharge status: Home / Self Care | Payer: Commercial Managed Care - PPO | Attending: Emergency Medicine | Admitting: Emergency Medicine

## 2017-11-30 DIAGNOSIS — Z79899 Other long term (current) drug therapy: Secondary | ICD-10-CM | POA: Diagnosis not present

## 2017-11-30 DIAGNOSIS — M79604 Pain in right leg: Secondary | ICD-10-CM | POA: Insufficient documentation

## 2017-11-30 DIAGNOSIS — F17210 Nicotine dependence, cigarettes, uncomplicated: Secondary | ICD-10-CM | POA: Insufficient documentation

## 2017-11-30 MED ORDER — IBUPROFEN 800 MG PO TABS: 800.0000 mg | ORAL_TABLET | Freq: Three times a day (TID) | ORAL | 0 refills | Status: DC | PRN

## 2017-11-30 MED ORDER — ONDANSETRON 4 MG PO TBDP
ORAL_TABLET | ORAL | Status: AC
  Filled 2017-11-30: qty 1

## 2017-11-30 MED ORDER — CYCLOBENZAPRINE HCL 5 MG PO TABS: 5.0000 mg | ORAL_TABLET | Freq: Three times a day (TID) | ORAL | 0 refills | Status: DC | PRN

## 2017-11-30 MED ORDER — ONDANSETRON 4 MG PO TBDP
4.0000 mg | ORAL_TABLET | Freq: Once | ORAL | Status: AC
  Administered 2017-11-30: 4 mg via ORAL

## 2017-11-30 MED ORDER — FENTANYL CITRATE (PF) 100 MCG/2ML IJ SOLN
100.0000 ug | Freq: Once | INTRAMUSCULAR | Status: AC
  Administered 2017-11-30: 100 ug via INTRAMUSCULAR
  Filled 2017-11-30: qty 2
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
## 2017-11-30 NOTE — ED Notes (Signed)
Patient transported to X-ray 
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
## 2018-02-09 ENCOUNTER — Encounter: Payer: Self-pay | Admitting: Neurology

## 2018-02-09 ENCOUNTER — Ambulatory Visit: Payer: Commercial Managed Care - PPO | Admitting: Neurology

## 2018-02-09 ENCOUNTER — Other Ambulatory Visit: Payer: Self-pay

## 2018-02-09 ENCOUNTER — Encounter (INDEPENDENT_AMBULATORY_CARE_PROVIDER_SITE_OTHER): Payer: Self-pay

## 2018-02-09 VITALS — BP 138/86 | HR 91 | Wt 201.5 lb

## 2018-02-09 DIAGNOSIS — G43419 Hemiplegic migraine, intractable, without status migrainosus: Secondary | ICD-10-CM

## 2018-02-09 DIAGNOSIS — R569 Unspecified convulsions: Secondary | ICD-10-CM | POA: Diagnosis not present

## 2018-02-09 DIAGNOSIS — I6930 Unspecified sequelae of cerebral infarction: Secondary | ICD-10-CM | POA: Diagnosis not present

## 2018-02-09 HISTORY — DX: Unspecified sequelae of cerebral infarction: I69.30

## 2018-02-09 MED ORDER — NORTRIPTYLINE HCL 10 MG PO CAPS: ORAL_CAPSULE | ORAL | 3 refills | Status: DC

## 2018-02-09 NOTE — Patient Instructions (Signed)
   We will start Nortriptyline at night for the headache.  Pamelor (nortriptyline) is an antidepressant medication that has many uses that may include headache, whiplash injuries, or for peripheral neuropathy pain. Side effects may include drowsiness, dry mouth, blurred vision, or constipation. As with any antidepressant medication, worsening depression may occur. If you had any significant side effects, please call our office. The full effects of this medication may take 7-10 days after starting the drug, or going up on the dose.
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
## 2018-04-12 ENCOUNTER — Encounter: Payer: Self-pay | Admitting: Emergency Medicine

## 2018-04-12 ENCOUNTER — Emergency Department
Admission: EM | Admit: 2018-04-12 | Discharge: 2018-04-12 | Disposition: A | Discharge status: Home / Self Care | Payer: Commercial Managed Care - PPO | Attending: Emergency Medicine | Admitting: Emergency Medicine

## 2018-04-12 ENCOUNTER — Emergency Department: Payer: Commercial Managed Care - PPO

## 2018-04-12 ENCOUNTER — Other Ambulatory Visit: Payer: Self-pay

## 2018-04-12 DIAGNOSIS — R05 Cough: Secondary | ICD-10-CM | POA: Diagnosis present

## 2018-04-12 DIAGNOSIS — R0981 Nasal congestion: Secondary | ICD-10-CM | POA: Diagnosis not present

## 2018-04-12 DIAGNOSIS — J209 Acute bronchitis, unspecified: Secondary | ICD-10-CM

## 2018-04-12 DIAGNOSIS — Z79899 Other long term (current) drug therapy: Secondary | ICD-10-CM | POA: Diagnosis not present

## 2018-04-12 MED ORDER — AZITHROMYCIN 500 MG PO TABS
500.0000 mg | ORAL_TABLET | Freq: Once | ORAL | Status: AC
  Administered 2018-04-12: 500 mg via ORAL
  Filled 2018-04-12: qty 1

## 2018-04-12 MED ORDER — AZITHROMYCIN 500 MG PO TABS: 500.0000 mg | ORAL_TABLET | Freq: Every day | ORAL | 0 refills | Status: AC

## 2018-04-12 MED ORDER — PREDNISONE 20 MG PO TABS
60.0000 mg | ORAL_TABLET | Freq: Once | ORAL | Status: AC
  Administered 2018-04-12: 60 mg via ORAL
  Filled 2018-04-12: qty 6

## 2018-04-12 MED ORDER — HYDROCOD POLST-CPM POLST ER 10-8 MG/5ML PO SUER: 5.0000 mL | Freq: Two times a day (BID) | ORAL | 0 refills | Status: DC | PRN

## 2018-04-12 MED ORDER — ALBUTEROL SULFATE HFA 108 (90 BASE) MCG/ACT IN AERS: 2.0000 | INHALATION_SPRAY | Freq: Four times a day (QID) | RESPIRATORY_TRACT | 0 refills | Status: DC | PRN

## 2018-04-12 MED ORDER — ALBUTEROL SULFATE (2.5 MG/3ML) 0.083% IN NEBU
2.5000 mg | INHALATION_SOLUTION | Freq: Once | RESPIRATORY_TRACT | Status: AC
  Administered 2018-04-12: 2.5 mg via RESPIRATORY_TRACT
  Filled 2018-04-12: qty 3

## 2018-04-12 MED ORDER — HYDROCOD POLST-CPM POLST ER 10-8 MG/5ML PO SUER
5.0000 mL | Freq: Once | ORAL | Status: AC
  Administered 2018-04-12: 5 mL via ORAL
  Filled 2018-04-12: qty 5

## 2018-04-12 NOTE — ED Triage Notes (Signed)
Patient ambulatory to triage with steady gait, without difficulty or distress noted; pt reports x 2wks having prod cough green sputum, sinus congestion, HA

## 2018-04-12 NOTE — ED Provider Notes (Signed)
[facility] Emergency Department Provider Note    First MD Initiated Contact with Patient 04/12/18 [phone]     (approximate)  I have reviewed the triage vital signs and the nursing notes.   HISTORY  Chief Complaint Cough and Nasal Congestion   HPI [name] is a 47 y.o. female with below list of chronic medical conditions presents to the emergency department with productive cough with green sputum x2 weeks.  Patient admits to chills however no fever.  Patient admits to sinus congestion and headache as a result of coughing   Past Medical History:  Diagnosis Date  . Brain aneurysm   . Common migraine with intractable migraine 03/03/2017  . Headache   . Hemiplegic migraine 10/07/2015  . 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 Seizures [facility])     Patient Active Problem List   Diagnosis Date Noted  . Late effects of cerebral ischemic stroke 02/09/2018  . Common migraine with intractable migraine 03/03/2017  . Seizures (HCC) 10/07/2015  . Hemiplegic migraine 10/07/2015    Past Surgical History:  Procedure Laterality Date  . ABDOMINAL HYSTERECTOMY      Prior to Admission medications   Medication Sig Start Date End Date Taking? Authorizing Provider  albuterol (PROVENTIL HFA;VENTOLIN HFA) 108 (90 Base) MCG/ACT inhaler Inhale 2 puffs into the lungs every 6 (six) hours as needed for wheezing or shortness of breath. 08/23/16   Evon Slack, PA-C  cyclobenzaprine (FLEXERIL) 5 MG tablet Take 1 tablet (5 mg total) by mouth 3 (three) times daily as needed for muscle spasms. 11/30/17   Minna Antis, MD  famotidine (PEPCID) 40 MG tablet Take 1 tablet (40 mg total) by mouth every evening. 02/09/17 02/09/18  Phineas Semen, MD  ibuprofen (ADVIL,MOTRIN) 800 MG tablet Take 1 tablet (800 mg total) by mouth every 8 (eight) hours as needed. 11/30/17   Minna Antis, MD  lidocaine (LMX) 4 % cream Apply 1 application topically  4 (four) times daily as needed. 08/30/16   Phineas Semen, MD  NONFORMULARY OR COMPOUNDED ITEM Ajovy 225mg / 1.5 ML Inject one SQ q1 month 09/14/17   Butch Penny, NP  nortriptyline (PAMELOR) 10 MG capsule Take one capsule at night for one week, then take 2 capsules at night for one week, then take 3 capsules at night 02/09/18   York Spaniel, MD  ondansetron (ZOFRAN) 4 MG tablet Take 1 tablet (4 mg total) by mouth every 8 (eight) hours as needed for nausea or vomiting. 09/02/16   Jeanmarie Plant, MD  predniSONE (DELTASONE) 5 MG tablet Begin taking 6 tablets daily, taper by one tablet daily until off the medication. 08/11/17   Butch Penny, NP  sucralfate (CARAFATE) 1 g tablet Take 1 tablet (1 g total) by mouth 4 (four) times daily. 02/09/17   Phineas Semen, MD  tizanidine (ZANAFLEX) 2 MG capsule Take 1 capsule (2 mg total) by mouth at bedtime. 02/16/17   Butch Penny, NP  zonisamide (ZONEGRAN) 100 MG capsule One capsule in the morning and 2 in the evening 11/18/16   York Spaniel, MD    Allergies Amoxicillin; Morphine and related; Penicillins; Rocephin [ceftriaxone sodium in dextrose]; Shellfish allergy; Tylenol [acetaminophen]; Topamax [topiramate]; and Tramadol  Family History  Problem Relation Age of Onset  . Stroke Mother   . Hypertension Father   . Diabetes Father   . Migraines Sister   . Migraines Brother   . Migraines Sister   .  Migraines Sister   . Migraines Sister   . Migraines Sister   . Migraines Sister   . Hypertension Paternal Aunt   . Diabetes Paternal Aunt   . Stroke Paternal Aunt   . Hypertension Paternal Uncle   . Diabetes Paternal Uncle   . Stroke Paternal Uncle   . Hypertension Maternal Grandmother   . Aneurysm Paternal Grandmother   . Diabetes Paternal Grandmother   . Hypertension Paternal Grandfather   . Cancer Paternal Grandfather   . Seizures Cousin     Social History Social History   Tobacco Use  . Smoking status: Current Every Day  Smoker    Packs/day: 0.50    Types: Cigarettes  . Smokeless tobacco: Never Used  Substance Use Topics  . Alcohol use: Yes    Alcohol/week: 0.0 oz    Comment: occasionally  . Drug use: No    Review of Systems Constitutional: No fever/chills Eyes: No visual changes. ENT: No sore throat.  Positive for sinus congestion Cardiovascular: Denies chest pain. Respiratory: Denies shortness of breath.  Positive for productive cough Gastrointestinal: No abdominal pain.  No nausea, no vomiting.  No diarrhea.  No constipation. Genitourinary: Negative for dysuria. Musculoskeletal: Negative for neck pain.  Negative for back pain. Integumentary: Negative for rash. Neurological: Negative for headaches, focal weakness or numbness.  ____________________________________________   PHYSICAL EXAM:  VITAL SIGNS: ED Triage Vitals [04/12/18 0518]  Enc Vitals Group     BP 135/61     Pulse Rate 87     Resp 18     Temp 98 F (36.7 C)     Temp Source Oral     SpO2 95 %     Weight 91.6 kg (202 lb)     Height 1.549 m (5\' 1" )     Head Circumference      Peak Flow      Pain Score 10     Pain Loc      Pain Edu?      Excl. in GC?     Constitutional: Alert and oriented.  Actively coughing  eyes: Conjunctivae are normal. Head: Atraumatic. Mouth/Throat: Mucous membranes are moist.  Oropharynx non-erythematous. Neck: No stridor.  Cardiovascular: Normal rate, regular rhythm. Good peripheral circulation. Grossly normal heart sounds. Respiratory: Normal respiratory effort.  No retractions.  Mild expiratory wheezing Gastrointestinal: Soft and nontender. No distention.  Musculoskeletal: No lower extremity tenderness nor edema. No gross deformities of extremities. Neurologic:  Normal speech and language. No gross focal neurologic deficits are appreciated.  Skin:  Skin is warm, dry and intact. No rash noted. Psychiatric: Mood and affect are normal. Speech and behavior are  normal.  __________________________________________  RADIOLOGY I, Komatke N BROWN, personally viewed and evaluated these images (plain radiographs) as part of my medical decision making, as well as reviewing the written report by the radiologist.  ED MD interpretation: No active cardiopulmonary disease per radiologist  Official radiology report(s): Dg Chest 2 View  Result Date: 04/12/2018 CLINICAL DATA:  Two week history of productive cough, sinus congestion, and headache. EXAM: CHEST - 2 VIEW COMPARISON:  02/09/2017 FINDINGS: The heart size and mediastinal contours are within normal limits. Both lungs are clear. The visualized skeletal structures are unremarkable. IMPRESSION: No active cardiopulmonary disease. Electronically Signed   By: Burman Nieves M.D.   On: 04/12/2018 05:54     Procedures   ____________________________________________   INITIAL IMPRESSION / ASSESSMENT AND PLAN / ED COURSE  As part of my medical decision making, I  reviewed the following data within the electronic MEDICAL RECORD NUMBER   47 year old female presented with above-stated history and physical exam concerning for pneumonia versus bronchitis and as such chest x-ray was performed which revealed no evidence of pneumonia.  Patient given Tussionex azithromycin and an albuterol breathing treatment in the emergency department.  Will prescribe Tussionex azithromycin for home. ____________________________________________  FINAL CLINICAL IMPRESSION(S) / ED DIAGNOSES  Final diagnoses:  Acute bronchitis, unspecified organism     MEDICATIONS GIVEN DURING THIS VISIT:  Medications  chlorpheniramine-HYDROcodone (TUSSIONEX) 10-8 MG/5ML suspension 5 mL (has no administration in time range)  azithromycin (ZITHROMAX) tablet 500 mg (has no administration in time range)  albuterol (PROVENTIL) (2.5 MG/3ML) 0.083% nebulizer solution 2.5 mg (has no administration in time range)  predniSONE (DELTASONE) tablet 60 mg (has  no administration in time range)     ED Discharge Orders    None       Note:  This document was prepared using Dragon voice recognition software and may include unintentional dictation errors.    Darci CurrentBrown, [place]ita N, MD 04/12/18 [phone]

## 2018-07-09 ENCOUNTER — Emergency Department: Payer: Commercial Managed Care - PPO

## 2018-07-09 ENCOUNTER — Emergency Department
Admission: EM | Admit: 2018-07-09 | Discharge: 2018-07-09 | Disposition: A | Discharge status: Home / Self Care | Payer: Commercial Managed Care - PPO | Attending: Emergency Medicine | Admitting: Emergency Medicine

## 2018-07-09 ENCOUNTER — Encounter: Payer: Self-pay | Admitting: Emergency Medicine

## 2018-07-09 ENCOUNTER — Other Ambulatory Visit: Payer: Self-pay

## 2018-07-09 DIAGNOSIS — G629 Polyneuropathy, unspecified: Secondary | ICD-10-CM | POA: Insufficient documentation

## 2018-07-09 DIAGNOSIS — M79602 Pain in left arm: Secondary | ICD-10-CM | POA: Diagnosis not present

## 2018-07-09 DIAGNOSIS — Z79899 Other long term (current) drug therapy: Secondary | ICD-10-CM | POA: Diagnosis not present

## 2018-07-09 DIAGNOSIS — R079 Chest pain, unspecified: Secondary | ICD-10-CM | POA: Diagnosis present

## 2018-07-09 DIAGNOSIS — F17210 Nicotine dependence, cigarettes, uncomplicated: Secondary | ICD-10-CM | POA: Diagnosis not present

## 2018-07-09 LAB — BASIC METABOLIC PANEL
ANION GAP: 8 (ref 5–15)
BUN: 12 mg/dL (ref 6–20)
CALCIUM: 9.5 mg/dL (ref 8.9–10.3)
CO2: 28 mmol/L (ref 22–32)
CREATININE: 0.66 mg/dL (ref 0.44–1.00)
Chloride: 107 mmol/L (ref 98–111)
GFR calc Af Amer: >60 mL/min (ref >60)
GFR calc non Af Amer: >60 mL/min (ref >60)
GLUCOSE: 115 mg/dL — AB (ref 70–99)
Potassium: 3.1 mmol/L — ABNORMAL LOW (ref 3.5–5.1)
Sodium: 143 mmol/L (ref 135–145)

## 2018-07-09 LAB — CBC
HCT: 39.9 % (ref 35.0–47.0)
Hemoglobin: 13.3 g/dL (ref 12.0–16.0)
MCH: 26.6 pg (ref 26.0–34.0)
MCHC: 33.2 g/dL (ref 32.0–36.0)
MCV: 80.1 fL (ref 80.0–100.0)
PLATELETS: 320 10*3/uL (ref 150–440)
RBC: 4.99 MIL/uL (ref 3.80–5.20)
RDW: 14.9 % — AB (ref 11.5–14.5)
WBC: 17 10*3/uL — ABNORMAL HIGH (ref 3.6–11.0)

## 2018-07-09 LAB — BRAIN NATRIURETIC PEPTIDE: B Natriuretic Peptide: 21 pg/mL (ref 0.0–100.0)

## 2018-07-09 LAB — TROPONIN I

## 2018-07-09 MED ORDER — DEXAMETHASONE SODIUM PHOSPHATE 10 MG/ML IJ SOLN
10.0000 mg | Freq: Once | INTRAMUSCULAR | Status: AC
  Administered 2018-07-09: 10 mg via INTRAVENOUS
  Filled 2018-07-09: qty 1

## 2018-07-09 MED ORDER — METHYLPREDNISOLONE 4 MG PO TBPK: ORAL_TABLET | ORAL | 0 refills | Status: DC

## 2018-07-09 MED ORDER — KETOROLAC TROMETHAMINE 30 MG/ML IJ SOLN
30.0000 mg | Freq: Once | INTRAMUSCULAR | Status: AC
  Administered 2018-07-09: 30 mg via INTRAVENOUS
  Filled 2018-07-09: qty 1

## 2018-07-09 NOTE — ED Triage Notes (Signed)
Pt here for left sided chest pain and left arm pain. + SHOB with pain as well.  Has a numb/tingling feeling to left arm as well as pain. Sx started yesterday. SHOB significantly increased when laying flat.  Pt dry heaving in triage from nausea.  Pt is smoker but denies birth control or travel.

## 2018-07-09 NOTE — ED Notes (Addendum)
Patient reports decreased pain in both chest and arm.

## 2018-07-09 NOTE — ED Notes (Signed)
Sling placed on patient per patient request with MD approval

## 2018-07-09 NOTE — ED Notes (Signed)
Patient c/o left arm pain radiating into left chest, SOB, nausea, vomiting, and tenderness to palpation of left arm/hand. Patient reports both pain and SOB worsens when lying flat.

## 2018-07-09 NOTE — ED Notes (Signed)
Reviewed discharge instructions, follow-up care, and prescriptions with patient. Patient verbalized understanding of all information reviewed. Patient stable, with no distress noted at this time.    

## 2018-07-09 NOTE — ED Provider Notes (Signed)
[facility] Emergency Department Provider Note ____________________________________________   First MD Initiated Contact with Patient 07/09/18 1856     (approximate)  I have reviewed the triage vital signs and the nursing notes.   HISTORY  Chief Complaint Chest Pain  HPI [name] is a 47 y.o. female with a history of brain aneurysm as well as left-sided hemiparesis was presented to the emergency department with left-sided elbow pain rating down the left side of the arm and up the left upper extremity to the chest.  Says the pain is been ongoing over the past 2 days.  She does not have any injury.  Says the pain hurts worse with movement.  Does not report any fever.  Has placed an Ace wrap to the left upper extremity secondary to the pain.  Says the pain feels like a sharp, stabbing pain and is severe especially with movement.  She is tried ibuprofen which helped minimally earlier in the day today.  She says that when she lies back she feels like there is a tightness in the left side of her chest that makes her feel like she is suffocating.  Past Medical History:  Diagnosis Date  . Brain aneurysm   . Common migraine with intractable migraine 03/03/2017  . Headache   . Hemiplegic migraine 10/07/2015  . 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 Seizures [facility](HCC)     Patient Active Problem List   Diagnosis Date Noted  . Late effects of cerebral ischemic stroke 02/09/2018  . Common migraine with intractable migraine 03/03/2017  . Seizures (HCC) 10/07/2015  . Hemiplegic migraine 10/07/2015    Past Surgical History:  Procedure Laterality Date  . ABDOMINAL HYSTERECTOMY      Prior to Admission medications   Medication Sig Start Date End Date Taking? Authorizing Provider  albuterol (PROVENTIL HFA;VENTOLIN HFA) 108 (90 Base) MCG/ACT inhaler Inhale 2 puffs into the lungs every 6 (six) hours as needed for wheezing or shortness of  breath. 04/12/18  Yes Darci CurrentBrown, Morgan N, MD  ibuprofen (ADVIL,MOTRIN) 200 MG tablet Take 200 mg by mouth daily as needed for moderate pain.   Yes [provider]  lidocaine (LMX) 4 % cream Apply 1 application topically 4 (four) times daily as needed. 08/30/16  Yes Phineas SemenGoodman, Graydon, MD  ondansetron (ZOFRAN) 4 MG tablet Take 1 tablet (4 mg total) by mouth every 8 (eight) hours as needed for nausea or vomiting. 09/02/16  Yes Jeanmarie PlantMcShane, James A, MD  zonisamide (ZONEGRAN) 100 MG capsule One capsule in the morning and 2 in the evening 11/18/16  Yes York SpanielWillis, Charles K, MD  albuterol (PROVENTIL HFA;VENTOLIN HFA) 108 (90 Base) MCG/ACT inhaler Inhale 2 puffs into the lungs every 6 (six) hours as needed for wheezing or shortness of breath. Patient not taking: Reported on 07/09/2018 08/23/16   Evon SlackGaines, Thomas C, PA-C  chlorpheniramine-HYDROcodone [facility](TUSSIONEX PENNKINETIC ER) 10-8 MG/5ML SUER Take 5 mLs by mouth every 12 (twelve) hours as needed. Patient not taking: Reported on 07/09/2018 04/12/18   Darci CurrentBrown, Eminence N, MD  cyclobenzaprine (FLEXERIL) 5 MG tablet Take 1 tablet (5 mg total) by mouth 3 (three) times daily as needed for muscle spasms. Patient not taking: Reported on 07/09/2018 11/30/17   Minna AntisPaduchowski, Kevin, MD  famotidine (PEPCID) 40 MG tablet Take 1 tablet (40 mg total) by mouth every evening. Patient not taking: Reported on 07/09/2018 02/09/17 02/09/18  Phineas SemenGoodman, Graydon, MD  ibuprofen (ADVIL,MOTRIN) 800 MG tablet Take 1 tablet (  800 mg total) by mouth every 8 (eight) hours as needed. Patient not taking: Reported on 07/09/2018 11/30/17   Minna Antis, MD  NONFORMULARY OR COMPOUNDED ITEM Ajovy 225mg / 1.5 ML Inject one SQ q1 month Patient not taking: Reported on 07/09/2018 09/14/17   Butch Penny, NP  nortriptyline (PAMELOR) 10 MG capsule Take one capsule at night for one week, then take 2 capsules at night for one week, then take 3 capsules at night Patient not taking: Reported on 07/09/2018 02/09/18    York Spaniel, MD  predniSONE (DELTASONE) 5 MG tablet Begin taking 6 tablets daily, taper by one tablet daily until off the medication. Patient not taking: Reported on 07/09/2018 08/11/17   Butch Penny, NP  sucralfate (CARAFATE) 1 g tablet Take 1 tablet (1 g total) by mouth 4 (four) times daily. Patient not taking: Reported on 07/09/2018 02/09/17   Phineas Semen, MD  tizanidine (ZANAFLEX) 2 MG capsule Take 1 capsule (2 mg total) by mouth at bedtime. Patient not taking: Reported on 07/09/2018 02/16/17   Butch Penny, NP    Allergies Amoxicillin; Morphine and related; Penicillins; Rocephin [ceftriaxone sodium in dextrose]; Shellfish allergy; Tylenol [acetaminophen]; Topamax [topiramate]; and Tramadol  Family History  Problem Relation Age of Onset  . Stroke Mother   . Hypertension Father   . Diabetes Father   . Migraines Sister   . Migraines Brother   . Migraines Sister   . Migraines Sister   . Migraines Sister   . Migraines Sister   . Migraines Sister   . Hypertension Paternal Aunt   . Diabetes Paternal Aunt   . Stroke Paternal Aunt   . Hypertension Paternal Uncle   . Diabetes Paternal Uncle   . Stroke Paternal Uncle   . Hypertension Maternal Grandmother   . Aneurysm Paternal Grandmother   . Diabetes Paternal Grandmother   . Hypertension Paternal Grandfather   . Cancer Paternal Grandfather   . Seizures Cousin     Social History Social History   Tobacco Use  . Smoking status: Current Every Day Smoker    Packs/day: 0.50    Types: Cigarettes  . Smokeless tobacco: Never Used  Substance Use Topics  . Alcohol use: Yes    Alcohol/week: 0.0 standard drinks    Comment: occasionally  . Drug use: No    Review of Systems  Constitutional: No fever/chills Eyes: No visual changes. ENT: No sore throat. Cardiovascular: As above Respiratory: Denies shortness of breath. Gastrointestinal: No abdominal pain.  No nausea, no vomiting.  No diarrhea.  No  constipation. Genitourinary: Negative for dysuria. Musculoskeletal: As above Skin: Negative for rash. Neurological: Negative for headaches, focal weakness or numbness.   ____________________________________________   PHYSICAL EXAM:  VITAL SIGNS: ED Triage Vitals  Enc Vitals Group     BP 07/09/18 1646 (!) 141/82     Pulse Rate 07/09/18 1646 84     Resp 07/09/18 1646 (!) 24     Temp 07/09/18 1646 97.6 F (36.4 C)     Temp Source 07/09/18 1646 Oral     SpO2 07/09/18 1646 100 %     Weight 07/09/18 1644 205 lb (93 kg)     Height 07/09/18 1644 5\' 4"  (1.626 m)     Head Circumference --      Peak Flow --      Pain Score 07/09/18 1644 10     Pain Loc --      Pain Edu? --      Excl. in GC? --  Constitutional: Alert and oriented. Well appearing and in no acute distress. Eyes: Conjunctivae are normal.  Head: Atraumatic. Nose: No congestion/rhinnorhea. Mouth/Throat: Mucous membranes are moist.  Neck: No stridor.   Cardiovascular: Normal rate, regular rhythm. Grossly normal heart sounds.  Good peripheral circulation with equal and bilateral radial pulses. Respiratory: Normal respiratory effort.  No retractions. Lungs CTAB. Gastrointestinal: Soft and nontender. No distention.  Musculoskeletal: No lower extremity tenderness nor edema.  No joint effusions.  Patient initially with Ace wrap which was undone to the left upper extremity.  Pain to just light touch of the left upper extremity diffusely.  No swelling.  No erythema.  No warmth.  Neurovascularly intact to the hand with good grip strength as well as sensation that is intact light touch and intact radial pulse.  Tenderness diffusely to the left upper extremity when palpated.  Also with tenderness to the left sided chest wall anteriorly.  Neurologic:  Normal speech and language. No gross focal neurologic deficits are appreciated. Skin:  Skin is warm, dry and intact. No rash noted. Psychiatric: Mood and affect are normal. Speech  and behavior are normal.  ____________________________________________   LABS (all labs ordered are listed, but only abnormal results are displayed)  Labs Reviewed  BASIC METABOLIC PANEL - Abnormal; Notable for the following components:      Result Value   Potassium 3.1 (*)    Glucose, Bld 115 (*)    All other components within normal limits  CBC - Abnormal; Notable for the following components:   WBC 17.0 (*)    RDW 14.9 (*)    All other components within normal limits  TROPONIN I  BRAIN NATRIURETIC PEPTIDE  POC URINE PREG, ED   ____________________________________________  EKG  ED ECG REPORT I, Arelia Longest, the attending physician, personally viewed and interpreted this ECG.   Date: 07/09/2018  EKG Time: 1645  Rate: 90  Rhythm: normal sinus rhythm  Axis: Normal  Intervals:none  ST&T Change: No ST segment elevation or depression.  No abnormal T wave inversion.  ____________________________________________  RADIOLOGY  Chest x-ray without acute disease ____________________________________________   PROCEDURES  Procedure(s) performed:   Procedures  Critical Care performed:   ____________________________________________   INITIAL IMPRESSION / ASSESSMENT AND PLAN / ED COURSE  Pertinent labs & imaging results that were available during my care of the patient were reviewed by me and considered in my medical decision making (see chart for details).  DDX: Pneumonia, ACS, tennis elbow, radiculopathy, neuropathy As part of my medical decision making, I reviewed the following data within the electronic MEDICAL RECORD NUMBER Notes from prior ED visits  ----------------------------------------- 8:58 PM on 07/09/2018 -----------------------------------------  Patient at this time has received Toradol as well as Decadron.  Will require reassessment.  Chronically elevated white blood cell count.  We will plan to discharge on Medrol Dosepak as long as patient is  clinically improved.  Signed out to Dr. Michell Heinrich. ____________________________________________   FINAL CLINICAL IMPRESSION(S) / ED DIAGNOSES  Neuropathy.  NEW MEDICATIONS STARTED DURING THIS VISIT:  New Prescriptions   No medications on file     Note:  This document was prepared using Dragon voice recognition software and may include unintentional dictation errors.     Myrna Blazer, MD 07/09/18 [phone]

## 2018-08-16 ENCOUNTER — Ambulatory Visit: Payer: Commercial Managed Care - PPO | Admitting: Adult Health

## 2018-08-16 ENCOUNTER — Telehealth: Payer: Self-pay | Admitting: Adult Health

## 2018-08-16 ENCOUNTER — Encounter: Payer: Self-pay | Admitting: Adult Health

## 2018-08-16 VITALS — BP 122/74 | HR 85 | Ht 64.0 in | Wt 206.6 lb

## 2018-08-16 DIAGNOSIS — Z8679 Personal history of other diseases of the circulatory system: Secondary | ICD-10-CM

## 2018-08-16 DIAGNOSIS — R202 Paresthesia of skin: Secondary | ICD-10-CM

## 2018-08-16 DIAGNOSIS — G43409 Hemiplegic migraine, not intractable, without status migrainosus: Secondary | ICD-10-CM

## 2018-08-16 MED ORDER — FREMANEZUMAB-VFRM 225 MG/1.5ML ~~LOC~~ SOSY: 225.0000 mg | PREFILLED_SYRINGE | SUBCUTANEOUS | 11 refills | Status: DC

## 2018-08-16 NOTE — Telephone Encounter (Signed)
Patient returned my call she is scheduled for 08/23/18 at [facility]. °

## 2018-08-16 NOTE — Patient Instructions (Signed)
Your Plan:  Continue nortriptyline Restart Ajovy MRI brain If your symptoms worsen or you develop new symptoms please let us know.   Thank you for coming to see [place] at [facility]. I hope we have been able to provide you high quality care today.  You may receive a patient satisfaction survey over the next few weeks. We would appreciate your feedback and comments so that we may continue to improve ourselves and the health of our patients.
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
## 2018-08-16 NOTE — Telephone Encounter (Signed)
lvm for pt to call back about scheduling [facility] Auth: [id number] (exp. 08/16/18 to 09/16/18)

## 2018-08-16 NOTE — Progress Notes (Signed)
I have read the note, and I agree with the clinical assessment and plan.  Charles K Willis   

## 2018-08-23 ENCOUNTER — Ambulatory Visit: Payer: Commercial Managed Care - PPO

## 2018-08-23 ENCOUNTER — Other Ambulatory Visit: Payer: Self-pay | Admitting: Adult Health

## 2018-08-23 DIAGNOSIS — G43409 Hemiplegic migraine, not intractable, without status migrainosus: Secondary | ICD-10-CM

## 2018-08-23 DIAGNOSIS — Z8679 Personal history of other diseases of the circulatory system: Secondary | ICD-10-CM

## 2018-08-23 DIAGNOSIS — R202 Paresthesia of skin: Secondary | ICD-10-CM | POA: Diagnosis not present

## 2018-08-25 ENCOUNTER — Telehealth: Payer: Self-pay

## 2018-08-25 NOTE — Telephone Encounter (Signed)
-----   Message from Butch Penny, NP sent at 08/25/2018  2:58 PM EDT ----- MRI of the brain is relatively unremarkable.  No acute changes.

## 2018-08-25 NOTE — Telephone Encounter (Signed)
Spoke with [name] about her MRI results. She verbalized understanding her results. No questions or concerns at this time.

## 2018-09-12 ENCOUNTER — Telehealth: Payer: Self-pay

## 2018-09-12 NOTE — Telephone Encounter (Signed)
Pending approval for Ajovy 225 mg/1.5 mL Syringes Key: [id number] Rx number: [id number] PA Case Id: [id number] ICD 10 code: G43.019 and G43.409

## 2018-09-13 NOTE — Telephone Encounter (Signed)
Ajovy approved through 09/12/18- 12/13/2018. [facility]

## 2018-12-22 ENCOUNTER — Telehealth: Payer: Self-pay | Admitting: *Deleted

## 2018-12-22 NOTE — Telephone Encounter (Signed)
Received CMM for reauthorization of ajovy.  Spoke to pt and she relayed that she feels overall the ajovy has helped her migraines.  Will proceed with reauthorization.  Key # [id number] for ajovy initiated. [facility]

## 2018-12-23 NOTE — Telephone Encounter (Signed)
Approved Ajovy 12-22-18 thru 12-23-19  [facility].  ID [id number].   Fax confirmation received [facility] [phone].  LMVM for pt on home # that approval for ajovy given.

## 2018-12-25 ENCOUNTER — Emergency Department
Admission: EM | Admit: 2018-12-25 | Discharge: 2018-12-25 | Disposition: A | Discharge status: Home / Self Care | Payer: Commercial Managed Care - PPO | Attending: Emergency Medicine | Admitting: Emergency Medicine

## 2018-12-25 ENCOUNTER — Encounter: Payer: Self-pay | Admitting: Emergency Medicine

## 2018-12-25 DIAGNOSIS — M5416 Radiculopathy, lumbar region: Secondary | ICD-10-CM | POA: Diagnosis not present

## 2018-12-25 DIAGNOSIS — Z79899 Other long term (current) drug therapy: Secondary | ICD-10-CM | POA: Diagnosis not present

## 2018-12-25 DIAGNOSIS — F17210 Nicotine dependence, cigarettes, uncomplicated: Secondary | ICD-10-CM | POA: Diagnosis not present

## 2018-12-25 DIAGNOSIS — M79605 Pain in left leg: Secondary | ICD-10-CM | POA: Diagnosis present

## 2018-12-25 MED ORDER — METHOCARBAMOL 500 MG PO TABS
1000.0000 mg | ORAL_TABLET | Freq: Once | ORAL | Status: AC
  Administered 2018-12-25: 1000 mg via ORAL
  Filled 2018-12-25: qty 2

## 2018-12-25 MED ORDER — MELOXICAM 15 MG PO TABS: 15.0000 mg | ORAL_TABLET | Freq: Every day | ORAL | 0 refills | Status: DC

## 2018-12-25 MED ORDER — MELOXICAM 7.5 MG PO TABS
15.0000 mg | ORAL_TABLET | Freq: Once | ORAL | Status: AC
  Administered 2018-12-25: 15 mg via ORAL
  Filled 2018-12-25: qty 2

## 2018-12-25 MED ORDER — OXYCODONE HCL 5 MG PO TABS
5.0000 mg | ORAL_TABLET | Freq: Once | ORAL | Status: AC
  Administered 2018-12-25: 5 mg via ORAL
  Filled 2018-12-25: qty 1

## 2018-12-25 MED ORDER — METHOCARBAMOL 500 MG PO TABS: 500.0000 mg | ORAL_TABLET | Freq: Four times a day (QID) | ORAL | 0 refills | Status: DC

## 2018-12-25 MED ORDER — OXYCODONE HCL 5 MG PO TABS: 5.0000 mg | ORAL_TABLET | Freq: Three times a day (TID) | ORAL | 0 refills | Status: DC | PRN

## 2018-12-25 NOTE — ED Triage Notes (Signed)
Pt reports burning and pain that radiates from bottom of feet up into buttocks x2 weeks. Pt denies injury.

## 2018-12-25 NOTE — ED Provider Notes (Signed)
[facility] Emergency Department Provider Note  ____________________________________________  Time seen: Approximately 10:16 PM  I have reviewed the triage vital signs and the nursing notes.   HISTORY  Chief Complaint Leg Pain and Foot Pain    HPI [name] is a 48 y.o. female who presents the emergency department complaining of radicular type symptoms to bilateral lower extremities.  Patient reports that several weeks ago she developed left lower leg radiculopathy.  This started to worsen and she eventually developed right leg symptoms.  Patient denies any trauma to the lower back.  No history of sciatica or radiculopathy.  Symptoms have been ongoing times several weeks.  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  No loss of sensation.  No loss of bowel or bladder function, saddle anesthesia or paresthesias.  No urinary or GI symptoms.    Past Medical History:  Diagnosis Date  . Brain aneurysm   . Common migraine with intractable migraine 03/03/2017  . Headache   . Hemiplegic migraine 10/07/2015  . 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 Seizures [facility])     Patient Active Problem List   Diagnosis Date Noted  . Late effects of cerebral ischemic stroke 02/09/2018  . Common migraine with intractable migraine 03/03/2017  . Seizures (HCC) 10/07/2015  . Hemiplegic migraine 10/07/2015    Past Surgical History:  Procedure Laterality Date  . ABDOMINAL HYSTERECTOMY      Prior to Admission medications   Medication Sig Start Date End Date Taking? Authorizing Provider  albuterol (PROVENTIL HFA;VENTOLIN HFA) 108 (90 Base) MCG/ACT inhaler Inhale 2 puffs into the lungs every 6 (six) hours as needed for wheezing or shortness of breath. 04/12/18   Darci Current, MD  cyclobenzaprine (FLEXERIL) 5 MG tablet Take 1 tablet (5 mg total) by mouth 3  (three) times daily as needed for muscle spasms. Patient not taking: Reported on 07/09/2018 11/30/17   Minna Antis, MD  Fremanezumab-vfrm (AJOVY) 225 MG/1.5ML SOSY Inject 225 mg into the skin every 30 (thirty) days. 08/16/18   Butch Penny, NP  meloxicam (MOBIC) 15 MG tablet Take 1 tablet (15 mg total) by mouth daily. 12/25/18   Cuthriell, Delorise Royals, PA-C  methocarbamol (ROBAXIN) 500 MG tablet Take 1 tablet (500 mg total) by mouth 4 (four) times daily. 12/25/18   Cuthriell, Delorise Royals, PA-C  NONFORMULARY OR COMPOUNDED ITEM Ajovy / 1.5 ML Inject one SQ q1 month Patient not taking: Reported on 07/09/2018 09/14/17   Butch Penny, NP  nortriptyline (PAMELOR) 10 MG capsule Take one capsule at night for one week, then take 2 capsules at night for one week, then take 3 capsules at night Patient not taking: Reported on 07/09/2018 02/09/18   York Spaniel, MD  ondansetron (ZOFRAN) 4 MG tablet Take 1 tablet (4 mg total) by mouth every 8 (eight) hours as needed for nausea or vomiting. 09/02/16   Jeanmarie Plant, MD  oxyCODONE (ROXICODONE) 5 MG immediate release tablet Take 1 tablet (5 mg total) by mouth every 8 (eight) hours as needed. 12/25/18 12/25/19  Cuthriell, Delorise Royals, PA-C  tizanidine (ZANAFLEX) 2 MG capsule Take 1 capsule (2 mg total) by mouth at bedtime. Patient not taking: Reported on 07/09/2018 02/16/17   Butch Penny, NP  zonisamide [facility]) 100 MG capsule One capsule in the morning and 2 in the evening 11/18/16   York Spaniel, MD  Allergies Amoxicillin; Morphine and related; Penicillins; Rocephin [ceftriaxone sodium in dextrose]; Shellfish allergy; Tylenol [acetaminophen]; Topamax [topiramate]; and Tramadol  Family History  Problem Relation Age of Onset  . Stroke Mother   . Hypertension Father   . Diabetes Father   . Migraines Sister   . Migraines Brother   . Migraines Sister   . Migraines Sister   . Migraines Sister   . Migraines Sister   . Migraines Sister   .  Hypertension Paternal Aunt   . Diabetes Paternal Aunt   . Stroke Paternal Aunt   . Hypertension Paternal Uncle   . Diabetes Paternal Uncle   . Stroke Paternal Uncle   . Hypertension Maternal Grandmother   . Aneurysm Paternal Grandmother   . Diabetes Paternal Grandmother   . Hypertension Paternal Grandfather   . Cancer Paternal Grandfather   . Seizures Cousin     Social History Social History   Tobacco Use  . Smoking status: Current Every Day Smoker    Packs/day: 0.50    Types: Cigarettes  . Smokeless tobacco: Never Used  Substance Use Topics  . Alcohol use: Yes    Alcohol/week: 0.0 standard drinks    Comment: occasionally  . Drug use: No     Review of Systems  Constitutional: No fever/chills Eyes: No visual changes.  Cardiovascular: no chest pain. Respiratory: no cough. No SOB. Gastrointestinal: No abdominal pain.  No nausea, no vomiting.   Musculoskeletal: Positive for pain radiating from the lower back into the bilateral lower extremities. Skin: Negative for rash, abrasions, lacerations, ecchymosis. Neurological: Negative for headaches, focal weakness or numbness. 10-point ROS otherwise negative.  ____________________________________________   PHYSICAL EXAM:  VITAL SIGNS: ED Triage Vitals [12/25/18 1954]  Enc Vitals Group     BP (!) 144/85     Pulse Rate 91     Resp 18     Temp 98.3 F (36.8 C)     Temp Source Oral     SpO2 99 %     Weight      Height      Head Circumference      Peak Flow      Pain Score      Pain Loc      Pain Edu?      Excl. in GC?      Constitutional: Alert and oriented. Well appearing and in no acute distress. Eyes: Conjunctivae are normal. PERRL. EOMI. Head: Atraumatic. Neck: No stridor.    Cardiovascular: Normal rate, regular rhythm. Normal S1 and S2.  Good peripheral circulation. Respiratory: Normal respiratory effort without tachypnea or retractions. Lungs CTAB. Good air entry to the bases with no decreased or absent  breath sounds. Musculoskeletal: Full range of motion to all extremities. No gross deformities appreciated.  Visualization of the lower back and bilateral lower extremities reveals no appreciable visible findings.  Patient is nontender to palpation over the lumbar spine but is mildly tender to palpation of bilateral sciatic notches.  Patient is also tender to palpation over the plantar aspects of bilateral feet.  No other tenderness to palpation.  Full range of motion to bilateral hips, knees, ankles.  Dorsalis pedis pulse intact bilateral lower extremity.  Sensation intact and equal bilateral lower extremities. Neurologic:  Normal speech and language. No gross focal neurologic deficits are appreciated.  Skin:  Skin is warm, dry and intact. No rash noted. Psychiatric: Mood and affect are normal. Speech and behavior are normal. Patient exhibits appropriate insight and judgement.   ____________________________________________  LABS (all labs ordered are listed, but only abnormal results are displayed)  Labs Reviewed - No data to display ____________________________________________  EKG   ____________________________________________  RADIOLOGY   No results found.  ____________________________________________    PROCEDURES  Procedure(s) performed:    Procedures    Medications  oxyCODONE (Oxy IR/ROXICODONE) immediate release tablet 5 mg (has no administration in time range)  meloxicam (MOBIC) tablet 15 mg (has no administration in time range)  methocarbamol (ROBAXIN) tablet 1,000 mg (has no administration in time range)     ____________________________________________   INITIAL IMPRESSION / ASSESSMENT AND PLAN / ED COURSE  Pertinent labs & imaging results that were available during my care of the patient were reviewed by me and considered in my medical decision making (see chart for details).  Review of the [place] CSRS was performed in accordance of the [facility] prior to  dispensing any controlled drugs.      Patient's diagnosis is consistent with lumbar radiculopathy.  Patient presented to the emergency department several week history of burning and tingling sensation in bilateral lower extremity.  Exam was overall reassuring with no acute findings warranting investigation with labs or imaging.  Patient will be treated with limited pain medication, meloxicam and Robaxin.  Follow-up with primary care as needed..  Patient is given ED precautions to return to the ED for any worsening or new symptoms.     ____________________________________________  FINAL CLINICAL IMPRESSION(S) / ED DIAGNOSES  Final diagnoses:  Lumbar radiculopathy      NEW MEDICATIONS STARTED DURING THIS VISIT:  ED Discharge Orders         Ordered    meloxicam (MOBIC) 15 MG tablet  Daily     12/25/18 2233    methocarbamol (ROBAXIN) 500 MG tablet  4 times daily     12/25/18 2233    oxyCODONE (ROXICODONE) 5 MG immediate release tablet  Every 8 hours PRN     12/25/18 2233              This chart was dictated using voice recognition software/Dragon. Despite best efforts to proofread, errors can occur which can change the meaning. Any change was purely unintentional.    Racheal Patches, PA-C 12/25/18 2234    Minna Antis, MD 12/25/18 [phone]

## 2018-12-28 DIAGNOSIS — I69359 Hemiplegia and hemiparesis following cerebral infarction affecting unspecified side: Secondary | ICD-10-CM | POA: Diagnosis not present

## 2018-12-29 ENCOUNTER — Telehealth: Payer: Self-pay | Admitting: Adult Health

## 2018-12-29 MED ORDER — PREDNISONE 5 MG PO TABS: ORAL_TABLET | ORAL | 0 refills | Status: DC

## 2018-12-29 NOTE — Telephone Encounter (Signed)
I spoke to pt.  She is experiencing some back pain, radiating to L leg, when lying in bed with legs elevated has severe pain, she can hardly walk.  She has had some issue with L hemiparesis, numbness before.  This started several weeks ago, see ED note from 12-27-18.  She stated the medications given to her did not really help much, maybe decreased some pain.  She did not have any xrays or scans.  MM/NP saw and mentions in last note some L leg pain/ neuropathy.  She stated went to [facility] for eval.  Since a new problem, see you.  Please advise.

## 2018-12-29 NOTE — Addendum Note (Signed)
Addended by: York Spaniel on: 12/29/2018 03:17 PM   Modules accepted: Orders

## 2018-12-29 NOTE — Telephone Encounter (Signed)
Pt states she went to the hospital Sunday.  Pt states she is having sever pain.  Pt states there there was mention of radical pain.  Pt states each time she stands it is hard to walk and her feet hurt.  Please call

## 2018-12-29 NOTE — Telephone Encounter (Signed)
I called the patient.  The patient has had some chronic problems with back pain and left leg pain, but within the last couple weeks she has had increased discomfort down both legs, she has numbness and tingling in both feet and pain going up the legs, she has pain that is worse when she is on her feet.  She denies any changes in the way the bowels or the bladder are working.  The patient was given a muscle relaxant and some oxycodone to take for pain through the ER, I will put her on a 6-day course of prednisone, get a work in revisit.

## 2018-12-29 NOTE — Telephone Encounter (Signed)
LMVM for pt that returning her call.  

## 2018-12-29 NOTE — Telephone Encounter (Signed)
I was able to contact the pt and schedule a f/u visit. Pt has been scheduled for 01/11/19 at 7:30 am with a check in time of 7 am.

## 2018-12-30 DIAGNOSIS — E7849 Other hyperlipidemia: Secondary | ICD-10-CM | POA: Diagnosis not present

## 2018-12-30 DIAGNOSIS — E034 Atrophy of thyroid (acquired): Secondary | ICD-10-CM | POA: Diagnosis not present

## 2018-12-30 DIAGNOSIS — I10 Essential (primary) hypertension: Secondary | ICD-10-CM | POA: Diagnosis not present

## 2019-01-11 ENCOUNTER — Encounter: Payer: Self-pay | Admitting: Neurology

## 2019-01-11 ENCOUNTER — Telehealth: Payer: Self-pay | Admitting: Neurology

## 2019-01-11 ENCOUNTER — Other Ambulatory Visit: Payer: Self-pay

## 2019-01-11 ENCOUNTER — Ambulatory Visit: Payer: Commercial Managed Care - PPO | Admitting: Neurology

## 2019-01-11 VITALS — BP 127/80 | HR 85 | Ht 64.0 in | Wt 210.3 lb

## 2019-01-11 DIAGNOSIS — M5416 Radiculopathy, lumbar region: Secondary | ICD-10-CM | POA: Diagnosis not present

## 2019-01-11 DIAGNOSIS — M5412 Radiculopathy, cervical region: Secondary | ICD-10-CM | POA: Diagnosis not present

## 2019-01-11 MED ORDER — OXYCODONE HCL 5 MG PO TABS: 5.0000 mg | ORAL_TABLET | Freq: Three times a day (TID) | ORAL | 0 refills | Status: AC | PRN

## 2019-01-11 MED ORDER — CYCLOBENZAPRINE HCL 10 MG PO TABS
10.0000 mg | ORAL_TABLET | Freq: Three times a day (TID) | ORAL | 1 refills | Status: DC | PRN
Start: 2019-01-11 — End: 2022-06-30

## 2019-01-11 MED ORDER — GABAPENTIN 300 MG PO CAPS: 300.0000 mg | ORAL_CAPSULE | Freq: Three times a day (TID) | ORAL | 3 refills | Status: DC

## 2019-01-11 NOTE — Patient Instructions (Signed)
We will start gabapentin and flexeril for the back pain.   Neurontin (gabapentin) may result in drowsiness, ankle swelling, gait instability, or possibly dizziness. Please contact our office if significant side effects occur with this medication.

## 2019-01-11 NOTE — Telephone Encounter (Addendum)
Patient left FMLA paper work with check out staff but refused to pay 50 $ fee. Pt was advised by Florentina Addison H at check out forms will not be processed unless payment is received.

## 2019-01-11 NOTE — Telephone Encounter (Signed)
[facility] pending faxed clinical notes.  °

## 2019-01-11 NOTE — Telephone Encounter (Signed)
Patient just had visit today - came back in stating there is nothing in the notes stating she should stay out of work and she needs documentation for her job. Willing to wait a few minutes in the lobby. Best call back is [phone].

## 2019-01-11 NOTE — Telephone Encounter (Signed)
Clent Ridges Berkley Harvey: [id number] (exp. 01/11/19 to 02/11/19)

## 2019-01-11 NOTE — Progress Notes (Signed)
Reason for visit: Migraine headache, left hemiparesis, neck pain, low back pain  [name] is an 48 y.o. female  History of present illness:  [name] is a 48 year old right-handed white female with a history of migraine headaches, the patient has a prior history of a cerebral aneurysm with a right parietal hemorrhage and a left hemiparesis.  The patient has had some chronic neck pain, MRI of the cervical spine was done in 2016 was completely normal.  The patient has had pain in the left arm and left leg following her original stroke event.  The patient has been working until around 25 December 2018.  The patient indicates that she has had a new problem that developed in mid February 2020.  The patient had spontaneous onset of increased neck pain, pain down the left arm with numbness and weakness of the left arm, she is not able to use her left hand as she had been previously.  She indicates that she turns her head to the right, she has pain down the left arm.  The patient is also had pain going down the spine into the low back, and now has pain down both legs, left greater than right.  She has tingling sensations from the hip level down to the feet bilaterally, this is a new problem.  The patient feels weak in both legs, she has not had any falls.  She denies issues with any change of control of bowels or the bladder.  The patient is on Ajovy, she feels that this has been helpful for her migraine, she is not having daily headaches anymore, her headaches are now about 3-4 times a week.  The patient has been on Botox previously, she received 2 injections without benefit.  The patient was given a course of prednisone, Flexeril, oxycodone, and Robaxin over the last several weeks without benefit.  She comes to this office for an evaluation.  Past Medical History:  Diagnosis Date   Brain aneurysm    Common migraine with intractable migraine 03/03/2017   Headache    Hemiplegic migraine 10/07/2015     Late effects of cerebral ischemic stroke 02/09/2018   Left hemiparesis   Seizures (HCC)     Past Surgical History:  Procedure Laterality Date   ABDOMINAL HYSTERECTOMY      Family History  Problem Relation Age of Onset   Stroke Mother    Hypertension Father    Diabetes Father    Migraines Sister    Migraines Brother    Migraines Sister    Migraines Sister    Migraines Sister    Migraines Sister    Migraines Sister    Hypertension Paternal Aunt    Diabetes Paternal Aunt    Stroke Paternal Aunt    Hypertension Paternal Uncle    Diabetes Paternal Uncle    Stroke Paternal Uncle    Hypertension Maternal Grandmother    Aneurysm Paternal Grandmother    Diabetes Paternal Grandmother    Hypertension Paternal Grandfather    Cancer Paternal Grandfather    Seizures Cousin     Social history:  reports that she has been smoking cigarettes. She has been smoking about 0.50 packs per day. She has never used smokeless tobacco. She reports current alcohol use. She reports that she does not use drugs.    Allergies  Allergen Reactions   Amoxicillin Anaphylaxis   Morphine And Related Anaphylaxis   Penicillins Anaphylaxis    Has patient had a PCN reaction  causing immediate rash, facial/tongue/throat swelling, SOB or lightheadedness with hypotension: Yes Has patient had a PCN reaction causing severe rash involving mucus membranes or skin necrosis: Yes Has patient had a PCN reaction that required hospitalization Yes Has patient had a PCN reaction occurring within the last 10 years: Yes If all of the above answers are "NO", then may proceed with Cephalosporin use.    Rocephin [Ceftriaxone Sodium In Dextrose] Anaphylaxis   Shellfish Allergy Anaphylaxis   Tylenol [Acetaminophen] Anaphylaxis   Topamax [Topiramate] Nausea And Vomiting   Tramadol     Medications:  Prior to Admission medications   Medication Sig Start Date End Date Taking? Authorizing  Provider  albuterol (PROVENTIL HFA;VENTOLIN HFA) 108 (90 Base) MCG/ACT inhaler Inhale 2 puffs into the lungs every 6 (six) hours as needed for wheezing or shortness of breath. 04/12/18  Yes Darci Current, MD  Fremanezumab-vfrm (AJOVY) 225 MG/1.5ML SOSY Inject 225 mg into the skin every 30 (thirty) days. 08/16/18  Yes Butch Penny, NP  zonisamide (ZONEGRAN) 100 MG capsule One capsule in the morning and 2 in the evening 11/18/16  Yes York Spaniel, MD  cyclobenzaprine (FLEXERIL) 5 MG tablet Take 1 tablet (5 mg total) by mouth 3 (three) times daily as needed for muscle spasms. Patient not taking: Reported on 07/09/2018 11/30/17   Minna Antis, MD  meloxicam (MOBIC) 15 MG tablet Take 1 tablet (15 mg total) by mouth daily. Patient not taking: Reported on 01/11/2019 12/25/18   Cuthriell, Delorise Royals, PA-C  methocarbamol (ROBAXIN) 500 MG tablet Take 1 tablet (500 mg total) by mouth 4 (four) times daily. Patient not taking: Reported on 01/11/2019 12/25/18   Cuthriell, Delorise Royals, PA-C  nortriptyline (PAMELOR) 10 MG capsule Take one capsule at night for one week, then take 2 capsules at night for one week, then take 3 capsules at night Patient not taking: Reported on 07/09/2018 02/09/18   York Spaniel, MD  ondansetron (ZOFRAN) 4 MG tablet Take 1 tablet (4 mg total) by mouth every 8 (eight) hours as needed for nausea or vomiting. Patient not taking: Reported on 01/11/2019 09/02/16   Jeanmarie Plant, MD  oxyCODONE (ROXICODONE) 5 MG immediate release tablet Take 1 tablet (5 mg total) by mouth every 8 (eight) hours as needed. Patient not taking: Reported on 01/11/2019 12/25/18 12/25/19  Cuthriell, Delorise Royals, PA-C  predniSONE (DELTASONE) 5 MG tablet Begin taking 6 tablets daily, taper by one tablet daily until off the medication. Patient not taking: Reported on 01/11/2019 12/29/18   York Spaniel, MD  tizanidine (ZANAFLEX) 2 MG capsule Take 1 capsule (2 mg total) by mouth at bedtime. Patient not taking:  Reported on 07/09/2018 02/16/17   Butch Penny, NP    ROS:  Out of a complete 14 system review of symptoms, the patient complains only of the following symptoms, and all other reviewed systems are negative.  Insomnia Back pain, aching muscles, muscle cramps  Blood pressure 127/80, pulse 85, height  (1.626 m), weight 210 lb 5 oz (95.4 kg).  Physical Exam  General: The patient is alert and cooperative at the time of the examination.  Neuromuscular: The patient lacks about 20 degrees of full lateral rotation of the cervical spine bilaterally.  With the low back, she is able to generate normal flexion, but she reports increased pain with this.  Skin: No significant peripheral edema is noted.   Neurologic Exam  Mental status: The patient is alert and oriented x 3 at the time of  the examination. The patient has apparent normal recent and remote memory, with an apparently normal attention span and concentration ability.   Cranial nerves: Facial symmetry is present. Speech is normal, no aphasia or dysarthria is noted. Extraocular movements are full. Visual fields are full.  Decreased pinprick and soft touch sensations noted on the left face compared to the right.  Motor: The patient has good strength in the right extremities.  The patient has decreased grip with the left hand, mild decrease strength with flexion and extension of the left arm, the patient is increased motor tone with the left leg, some weakness with hip flexion on the left, not seen on the right.  The patient has good strength with knee flexion and extension.  Sensory examination: Soft touch sensation is decreased on the left arm and left leg as compared to the right.  The patient has decreased vibratory sensation on the left arm and left leg.  Coordination: The patient has good finger-nose-finger and heel-to-shin on the right, the patient has difficulty performing this on the left.  Gait and station: The patient has a  circumduction gait with the left leg.  Gait is slightly wide-based.  Tandem gait was not performed, Romberg is negative.  Reflexes: Deep tendon reflexes are notable for a very slight increase in the deep tendon reflexes on the left biceps compared to the right, increased left ankle jerk reflex as compared to the right, knee jerk reflexes are symmetric.   MRI brain 08/23/18:  IMPRESSION:   MRI brain (without) demonstrating - Hemosiderin deposition in the right parietal parasagittal region correlating with known prior intracerebral hemorrhage from 2009. - No acute findings.  * MRI scan images were reviewed online. I agree with the written report.    Assessment/Plan:  1.  History of right parietal hemorrhage, left hemiparesis  2.  History of migraine headaches, intractable  3.  Gait disorder  4.  New onset increased neck and low back pain, pain in both legs and left arm, reports of increased weakness  The patient continues to have a left hemiparesis but she indicates that she is weaker than usual, she is not able to use her left hand as well as she had prior to onset of this new discomfort.  MRI of the cervical spine done in 2016 was normal.  We will repeat MRI of the cervical spine and lumbar spine, if no surgically amenable abnormalities are noted, the patient will be entered into physical therapy.  She will be placed on gabapentin 300 mg 3 times daily, Flexeril be added back taking 10 mg 3 times daily, a prescription was given for oxycodone.  The patient will be kept out of work for another 4 weeks.  I will contact her regarding the results of the testing above.  Marlan Palau MD 01/11/2019 7:27 AM  [facility] [address] [place], [place] [address]  Phone [phone] Fax [phone]

## 2019-01-12 DIAGNOSIS — Z0289 Encounter for other administrative examinations: Secondary | ICD-10-CM

## 2019-01-12 NOTE — Telephone Encounter (Signed)
i did ask the screening questions MR Cervical spine wo contrast & MR Lumbar spine wo contrast Dr. Maryjean Ka Auth: [id number] (exp. 01/11/19 to 02/11/19). Patient is scheduled at [facility] for 01/18/19.

## 2019-01-16 NOTE — Telephone Encounter (Signed)
Noted, I spoke to the patient and informed her due to our office protocol we will not be having MRI's for the time being in the office.. I informed her that I will send the order to [facility] bc I was informed that they are still scheduling for this week otherwise it will be 4 weeks or more out. She understood. I faxed the order to [facility].

## 2019-01-16 NOTE — Telephone Encounter (Signed)
Office policy states we have 16 business days to complete forms. Forms completed and fwd to medical records for processing on 01/16/19.

## 2019-01-16 NOTE — Telephone Encounter (Signed)
Pt has called asking for the status of her paperwork being signed off on and faxed into her HR dept(who's information is on front page of paperwork).  Pt said when she paid the feel she asked that this be faxed to her HR on the 19th.  Pt just informed she has to allow more than a 24 hour time frame for forms to be completed by provider.  Pt states the delaying of this form not being submitted back to her employer has her job on the line.  Pt is asking for a call with the status of this form being signed and faxed

## 2019-01-17 ENCOUNTER — Telehealth: Payer: Self-pay | Admitting: *Deleted

## 2019-01-17 NOTE — Telephone Encounter (Signed)
Pt FMLA form fax to [employer] [phone]

## 2019-01-18 ENCOUNTER — Other Ambulatory Visit: Payer: Commercial Managed Care - PPO

## 2019-01-19 DIAGNOSIS — M5412 Radiculopathy, cervical region: Secondary | ICD-10-CM | POA: Diagnosis not present

## 2019-01-19 DIAGNOSIS — M5117 Intervertebral disc disorders with radiculopathy, lumbosacral region: Secondary | ICD-10-CM | POA: Diagnosis not present

## 2019-01-20 ENCOUNTER — Telehealth: Payer: Self-pay | Admitting: Neurology

## 2019-01-20 NOTE — Telephone Encounter (Signed)
MRI of the cervical spine and lumbar spine were relatively unremarkable, no definite surgically amenable issues, the patient desires to have physical therapy we will get this set up.  She is to call our office if she wants physical therapy.   MRI cervical January 19, 2019:  Impression: Normal cervical MRI.   MRI lumbar January 19, 2019:  Impression: L5- S1 small left paracentral disc protrusion abutting the transversing S1 nerve root causing mild narrowing along the lateral recess.  No significant central canal stenosis.

## 2019-01-23 NOTE — Telephone Encounter (Signed)
G[place]ada Brown/HR at [employer] [phone] rec'd FMLA paperwork, however #3, pg 2 is checked no. By checking no it means she can do her job function, it should be checked yes since she has been taken out of work thru 4/15. Please correct and refax to [phone] ATTN: HR

## 2019-01-23 NOTE — Telephone Encounter (Signed)
FMLA form has been updated and fwd back to medical records to process.

## 2019-01-23 NOTE — Telephone Encounter (Signed)
  [name] could we locate these forms? Thanks!

## 2019-02-20 ENCOUNTER — Encounter: Payer: Self-pay | Admitting: Neurology

## 2019-02-20 ENCOUNTER — Telehealth: Payer: Self-pay | Admitting: Neurology

## 2019-02-20 NOTE — Telephone Encounter (Signed)
I called the patient.  The patient is still having a lot of back pain and leg discomfort if she stands for more than an hour and a half.  She does housecleaning for medical facilities.  I will indicate in the note that she can return to work if she is allowed a 10-minute break every hour and a half during the workday.  We will fax the note to [phone].

## 2019-02-20 NOTE — Telephone Encounter (Signed)
Pt states that since she was taken out of work her HR is needing a Letter stating that she can go back to work and what restrictions she has. Please fax this letter as soon as possible to [phone] attention Azzie Glatter.

## 2019-02-20 NOTE — Telephone Encounter (Signed)
I reviewed the FMLA paper work we completed on 01/11/19 Paper work stated pt was to remain out of work from 12/25/18 til 02/08/19. Did not see mention on FMLA forms of restrictions for the pt. Will fwd to MD to review.

## 2019-04-17 ENCOUNTER — Telehealth: Payer: Self-pay

## 2019-04-17 NOTE — Telephone Encounter (Signed)
Spoke with the patient and they have given verbal consent to file insurance and to do a mychart video visit. Mychart video link has been sent to the patient.   

## 2019-04-18 ENCOUNTER — Telehealth: Payer: Self-pay | Admitting: Adult Health

## 2019-04-18 NOTE — Telephone Encounter (Signed)
Patient has agreed to be seen via mychart by [name] at 10:30 am today. Appt time has been changed.

## 2020-01-08 ENCOUNTER — Telehealth: Payer: Self-pay | Admitting: Neurology

## 2020-01-08 NOTE — Telephone Encounter (Signed)
PA completed on cover my meds/[employer] for the patient. MDY:[id number] Will wait for response

## 2020-01-09 ENCOUNTER — Encounter: Payer: Self-pay | Admitting: Neurology

## 2020-01-09 NOTE — Telephone Encounter (Addendum)
Received fax from [employer]: Ajovy approved 01/08/20 - 01/07/2021. Called patient and made her aware of approval. She  verbalized understanding, appreciation.

## 2020-01-11 ENCOUNTER — Other Ambulatory Visit: Payer: Self-pay | Admitting: Adult Health

## 2020-06-02 ENCOUNTER — Emergency Department: Payer: Commercial Managed Care - PPO

## 2020-06-02 ENCOUNTER — Other Ambulatory Visit: Payer: Self-pay

## 2020-06-02 ENCOUNTER — Emergency Department
Admission: EM | Admit: 2020-06-02 | Discharge: 2020-06-02 | Disposition: A | Discharge status: Home / Self Care | Payer: Commercial Managed Care - PPO | Attending: Emergency Medicine | Admitting: Emergency Medicine

## 2020-06-02 DIAGNOSIS — Z79899 Other long term (current) drug therapy: Secondary | ICD-10-CM | POA: Diagnosis not present

## 2020-06-02 DIAGNOSIS — F17210 Nicotine dependence, cigarettes, uncomplicated: Secondary | ICD-10-CM | POA: Insufficient documentation

## 2020-06-02 DIAGNOSIS — Z20822 Contact with and (suspected) exposure to covid-19: Secondary | ICD-10-CM | POA: Diagnosis not present

## 2020-06-02 DIAGNOSIS — R569 Unspecified convulsions: Secondary | ICD-10-CM | POA: Insufficient documentation

## 2020-06-02 LAB — HCG, QUANTITATIVE, PREGNANCY: hCG, Beta Chain, Quant, S: 1 m[IU]/mL (ref <5)

## 2020-06-02 LAB — BASIC METABOLIC PANEL
Anion gap: 13 (ref 5–15)
BUN: 9 mg/dL (ref 6–20)
CO2: 24 mmol/L (ref 22–32)
Calcium: 9.2 mg/dL (ref 8.9–10.3)
Chloride: 105 mmol/L (ref 98–111)
Creatinine, Ser: 0.73 mg/dL (ref 0.44–1.00)
GFR calc Af Amer: >60 mL/min (ref >60)
GFR calc non Af Amer: >60 mL/min (ref >60)
Glucose, Bld: 155 mg/dL — ABNORMAL HIGH (ref 70–99)
Potassium: 3.1 mmol/L — ABNORMAL LOW (ref 3.5–5.1)
Sodium: 142 mmol/L (ref 135–145)

## 2020-06-02 LAB — CBC
HCT: 41.5 % (ref 36.0–46.0)
Hemoglobin: 13.2 g/dL (ref 12.0–15.0)
MCH: 26 pg (ref 26.0–34.0)
MCHC: 31.8 g/dL (ref 30.0–36.0)
MCV: 81.9 fL (ref 80.0–100.0)
Platelets: 366 10*3/uL (ref 150–400)
RBC: 5.07 MIL/uL (ref 3.87–5.11)
RDW: 14.8 % (ref 11.5–15.5)
WBC: 14.9 10*3/uL — ABNORMAL HIGH (ref 4.0–10.5)
nRBC: 0 % (ref 0.0–0.2)

## 2020-06-02 LAB — SARS CORONAVIRUS 2 BY RT PCR (HOSPITAL ORDER, PERFORMED IN ~~LOC~~ HOSPITAL LAB): SARS Coronavirus 2: NEGATIVE

## 2020-06-02 MED ORDER — CYCLOBENZAPRINE HCL 7.5 MG PO TABS: 7.5000 mg | ORAL_TABLET | Freq: Three times a day (TID) | ORAL | 0 refills | Status: DC | PRN

## 2020-06-02 MED ORDER — LORAZEPAM 2 MG/ML IJ SOLN
2.0000 mg | Freq: Once | INTRAMUSCULAR | Status: AC
  Administered 2020-06-02: 2 mg via INTRAVENOUS

## 2020-06-02 MED ORDER — VALPROATE SODIUM 500 MG/5ML IV SOLN
500.0000 mg | INTRAVENOUS | Status: AC
  Administered 2020-06-02: 500 mg via INTRAVENOUS
  Filled 2020-06-02: qty 5

## 2020-06-02 MED ORDER — VALPROATE SODIUM 500 MG/5ML IV SOLN
500.0000 mg | Freq: Once | INTRAVENOUS | Status: AC
  Administered 2020-06-02: 500 mg via INTRAVENOUS
  Filled 2020-06-02: qty 5

## 2020-06-02 MED ORDER — ZONISAMIDE 100 MG PO CAPS
ORAL_CAPSULE | ORAL | 0 refills | Status: DC
Start: 2020-06-02 — End: 2020-06-05

## 2020-06-02 MED ORDER — LORAZEPAM 2 MG/ML IJ SOLN
INTRAMUSCULAR | Status: AC
  Filled 2020-06-02: qty 1

## 2020-06-02 MED ORDER — IBUPROFEN 600 MG PO TABS
600.0000 mg | ORAL_TABLET | ORAL | Status: AC
  Administered 2020-06-02: 600 mg via ORAL
  Filled 2020-06-02: qty 1

## 2020-06-02 NOTE — ED Triage Notes (Addendum)
Pt [facility] since recent deaths in family. seizure x 2, witnessed by EMS hx of seizures takes depakote but states hasn't taken them since [facility] sees Amber neurolgoy hx migraines as well. when has a bad headache, speech is slurred. left side cramps when seizes.  A&O upon arrival. No meds given by EMS  4mg  zofran given by EMS since pt vomited after seizure.

## 2020-06-02 NOTE — ED Provider Notes (Signed)
[facility] Emergency Department Provider Note  ____________________________________________   None    (approximate)  I have reviewed the triage vital signs and the nursing notes.   HISTORY  Chief Complaint Seizures   HPI [name] is a 49 y.o. female with a past medical history of brain aneurysm, intractable migraines associated with left hemibody plegia/weakness, and seizure disorder who presents via EMS after several witnessed seizures by family.  In addition EMS states they had to witness seizures but did not give the patient any medications.  These were described as generalized shaking lasted less than 1 minute each.  Is unclear how much time elapsed between each seizure.  Patient states that she does not remember her seizures but states she has had a headache for several weeks and has not been taking any of her medications including her Depakote for 3 weeks which she is supposed to take for seizure disorder.  She states she has some weakness in her left arm and left leg which she has every time she has severe headaches sometimes with seizures.  She also states that after a seizure she often has slurred speech and she has not been drinking any alcohol today but this is very typical of her seizures.  She states she is otherwise been in her usual state of health without any recent fevers, chills, cough, nausea, vomiting, diarrhea, dysuria, rash, or recent injuries.  He does note she has been under a lot of stress lately due to the death of several family members recently.         Past Medical History:  Diagnosis Date  . Brain aneurysm   . Common migraine with intractable migraine 03/03/2017  . Headache   . Hemiplegic migraine 10/07/2015  . 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 Seizures [facility])     Patient Active Problem List   Diagnosis Date Noted  . Late effects of cerebral ischemic stroke 02/09/2018  . Common  migraine with intractable migraine 03/03/2017  . Seizures (HCC) 10/07/2015  . Hemiplegic migraine 10/07/2015    Past Surgical History:  Procedure Laterality Date  . ABDOMINAL HYSTERECTOMY      Prior to Admission medications   Medication Sig Start Date End Date Taking? Authorizing Provider  AJOVY 225 MG/1.5ML SOSY INJECT 225 MG INTO THE SKIN EVERY 30 DAYS 01/11/20   Butch Penny, NP  albuterol (PROVENTIL HFA;VENTOLIN HFA) 108 (90 Base) MCG/ACT inhaler Inhale 2 puffs into the lungs every 6 (six) hours as needed for wheezing or shortness of breath. 04/12/18   Darci Current, MD  cyclobenzaprine (FLEXERIL) 10 MG tablet Take 1 tablet (10 mg total) by mouth 3 (three) times daily as needed for muscle spasms. 01/11/19   York Spaniel, MD  gabapentin (NEURONTIN) 300 MG capsule Take 1 capsule (300 mg total) by mouth 3 (three) times daily. 01/11/19   York Spaniel, MD  meloxicam (MOBIC) 15 MG tablet Take 1 tablet (15 mg total) by mouth daily. Patient not taking: Reported on 01/11/2019 12/25/18   Cuthriell, Delorise Royals, PA-C  ondansetron (ZOFRAN) 4 MG tablet Take 1 tablet (4 mg total) by mouth every 8 (eight) hours as needed for nausea or vomiting. Patient not taking: Reported on 01/11/2019 09/02/16   Jeanmarie Plant, MD  zonisamide [facility]) 100 MG capsule One capsule in the morning and 2 in the evening 11/18/16   York Spaniel, MD    Allergies Amoxicillin, Morphine and related,  Penicillins, Rocephin [ceftriaxone sodium in dextrose], Shellfish allergy, Tylenol [acetaminophen], Topamax [topiramate], and Tramadol  Family History  Problem Relation Age of Onset  . Stroke Mother   . Hypertension Father   . Diabetes Father   . Migraines Sister   . Migraines Brother   . Migraines Sister   . Migraines Sister   . Migraines Sister   . Migraines Sister   . Migraines Sister   . Hypertension Paternal Aunt   . Diabetes Paternal Aunt   . Stroke Paternal Aunt   . Hypertension Paternal Uncle     . Diabetes Paternal Uncle   . Stroke Paternal Uncle   . Hypertension Maternal Grandmother   . Aneurysm Paternal Grandmother   . Diabetes Paternal Grandmother   . Hypertension Paternal Grandfather   . Cancer Paternal Grandfather   . Seizures Cousin     Social History Social History   Tobacco Use  . Smoking status: Current Every Day Smoker    Packs/day: 0.50    Types: Cigarettes  . Smokeless tobacco: Never Used  Substance Use Topics  . Alcohol use: Yes    Alcohol/week: 0.0 standard drinks    Comment: occasionally  . Drug use: No    Review of Systems  Review of Systems  Constitutional: Negative for chills and fever.  HENT: Negative for sore throat.   Eyes: Negative for pain.  Respiratory: Negative for cough and stridor.   Cardiovascular: Negative for chest pain.  Gastrointestinal: Negative for vomiting.  Skin: Negative for rash.  Neurological: Positive for seizures and weakness. Negative for loss of consciousness and headaches.  Psychiatric/Behavioral: Negative for suicidal ideas.  All other systems reviewed and are negative.     ____________________________________________   PHYSICAL EXAM:  VITAL SIGNS: ED Triage Vitals [06/02/20 1427]  Enc Vitals Group     BP (!) 159/79     Pulse Rate 95     Resp 20     Temp 98.8 F (37.1 C)     Temp Source Axillary     SpO2 100 %     Weight      Height      Head Circumference      Peak Flow      Pain Score      Pain Loc      Pain Edu?      Excl. in GC?    Vitals:   06/02/20 1427 06/02/20 1500  BP: (!) 159/79 (!) 156/72  Pulse: 95 (!) 113  Resp: 20 (!) 21  Temp: 98.8 F (37.1 C)   SpO2: 100% 97%   Physical Exam Vitals and nursing note reviewed.  Constitutional:      General: She is not in acute distress.    Appearance: She is well-developed.  HENT:     Head: Normocephalic and atraumatic.     Right Ear: External ear normal.     Left Ear: External ear normal.     Nose: Nose normal.     Mouth/Throat:      Mouth: Mucous membranes are moist.  Eyes:     Conjunctiva/sclera: Conjunctivae normal.  Cardiovascular:     Rate and Rhythm: Normal rate and regular rhythm.     Heart sounds: No murmur heard.   Pulmonary:     Effort: Pulmonary effort is normal. No respiratory distress.     Breath sounds: Normal breath sounds.  Abdominal:     Palpations: Abdomen is soft.     Tenderness: There is no abdominal tenderness.  Musculoskeletal:     Cervical back: Neck supple.  Skin:    General: Skin is warm and dry.     Capillary Refill: Capillary refill takes less than 2 seconds.  Neurological:     Mental Status: She is alert.     Motor: Weakness ( Patient is minimally able to move her left thumb and holds her left upper extremity flexed and her left lower extremity in extension.  She is unable to obey any commands in her left lower extremity.  Sensation is intact throughout all extremities.  Patien) present.      ____________________________________________   LABS (all labs ordered are listed, but only abnormal results are displayed)  Labs Reviewed  CBC - Abnormal; Notable for the following components:      Result Value   WBC 14.9 (*)    All other components within normal limits  BASIC METABOLIC PANEL - Abnormal; Notable for the following components:   Potassium 3.1 (*)    Glucose, Bld 155 (*)    All other components within normal limits  SARS CORONAVIRUS 2 BY RT PCR (HOSPITAL ORDER, PERFORMED IN [place])  HCG, QUANTITATIVE, PREGNANCY   ____________________________________________  EKG  Sinus rhythm with a ventricular rate of 95, normal axis, unremarkable intervals, and no clear evidence of acute ischemia or other significant underlying arrhythmia. ____________________________________________  RADIOLOGY   Official radiology report(s): CT Head Wo Contrast  Result Date: 06/02/2020 CLINICAL DATA:  Seizure x2, witnessed. EXAM: CT HEAD WITHOUT CONTRAST TECHNIQUE:  Contiguous axial images were obtained from the base of the skull through the vertex without intravenous contrast. COMPARISON:  12/01/2016 head CT. FINDINGS: Brain: No evidence of parenchymal hemorrhage or extra-axial fluid collection. No mass lesion, mass effect, or midline shift. No CT evidence of acute infarction. Cerebral volume is age appropriate. No ventriculomegaly. Vascular: No acute abnormality. Skull: No evidence of calvarial fracture. Sinuses/Orbits: The visualized paranasal sinuses are essentially clear. Other:  The mastoid air cells are unopacified. IMPRESSION: Negative head CT. No evidence of acute intracranial abnormality. No evidence of calvarial fracture. Electronically Signed   By: Delbert PhenixJason A Poff M.D.   On: 06/02/2020 15:35    ____________________________________________   PROCEDURES  Procedure(s) performed (including Critical Care):  .1-3 Lead EKG Interpretation Performed by: Gilles ChiquitoSmith, Danajah Birdsell P, MD Authorized by: Gilles ChiquitoSmith, Willam Munford P, MD     Interpretation: normal     ECG rate assessment: normal     Rhythm: sinus rhythm     Ectopy: none     Conduction: normal       ____________________________________________   INITIAL IMPRESSION / ASSESSMENT AND PLAN / ED COURSE        Patient history, exam, and ED work-up is most consistent with breakthrough seizure in the setting of noncompliance with prescribed Depakote for her seizure disorder.  On arrival patient is afebrile stable vital signs with above exam.  Shortly after arrival patient did have another seizure that lasted less than 1 minute and stopped prior to staff giving Ativan.  However Ativan was given in addition to Depakote load.  Prior to seizing in the ED patient denied any acute sick symptoms, toxic ingestion, EtOH use, or other clear precipitating factors.  CT head obtained shows no evidence of intracranial bleeding or mass and there is no significant electrolyte metabolic arrangements identified on BMP.  CBC shows mild  leukocytosis which is nonspecific in the setting of several seizures.  Patient hCG is negative.  We will plan to follow-up in the ED as  patient is postictal and will require some time to get her IV Depakote load.  If patient does return to baseline and is able to ambulate with a normal neuro exam she may be able to discharge home with a refill of her Depakote and close outpatient neurology follow-up.  Otherwise she will require neurology consult admission for status epilepticus.  Medications  valproate (DEPACON) 500 mg in dextrose 5 % 50 mL IVPB (has no administration in time range)  LORazepam (ATIVAN) injection 2 mg (2 mg Intravenous Given 06/02/20 1455)        ____________________________________________   FINAL CLINICAL IMPRESSION(S) / ED DIAGNOSES  Final diagnoses:  Seizure [facility])     ED Discharge Orders    None       Note:  This document was prepared using Dragon voice recognition software and may include unintentional dictation errors.   Gilles Chiquito, MD 06/02/20 [phone]

## 2020-06-02 NOTE — Discharge Instructions (Addendum)

## 2020-06-02 NOTE — ED Notes (Signed)
Pt taken to CT via stretcher.

## 2020-06-02 NOTE — ED Provider Notes (Signed)
Note patient resting, opens eyes to voice.  Requesting some ice chips to start with which I think is reasonable.  She is alert and oriented.  Still somnolent, but suspect this is likely related to having received Ativan.  Her daughter is at the bedside.  Patient resting comfortably without distress at this time receiving Depakote infusion.  CT Head Wo Contrast  Result Date: 06/02/2020 CLINICAL DATA:  Seizure x2, witnessed. EXAM: CT HEAD WITHOUT CONTRAST TECHNIQUE: Contiguous axial images were obtained from the base of the skull through the vertex without intravenous contrast. COMPARISON:  12/01/2016 head CT. FINDINGS: Brain: No evidence of parenchymal hemorrhage or extra-axial fluid collection. No mass lesion, mass effect, or midline shift. No CT evidence of acute infarction. Cerebral volume is age appropriate. No ventriculomegaly. Vascular: No acute abnormality. Skull: No evidence of calvarial fracture. Sinuses/Orbits: The visualized paranasal sinuses are essentially clear. Other:  The mastoid air cells are unopacified. IMPRESSION: Negative head CT. No evidence of acute intracranial abnormality. No evidence of calvarial fracture. Electronically Signed   By: Delbert Phenix M.D.   On: 06/02/2020 15:35       Sharyn Creamer, MD 06/02/20 [phone]

## 2020-06-02 NOTE — ED Notes (Signed)
Pt vomited 30 mL clear emesis. Pt cleaned up, reassured. Resting in bed.

## 2020-06-02 NOTE — ED Provider Notes (Signed)
Case discussed with neurology Dr. Kirtland Bouchard (on call)  Reviewed case clinical history with neurology.  Recommends discharge after receiving additional dose of IV medication.  Discussed with the patient, will she had previously run out of her medicine Zonogram.  Will represcribe  Return precautions and treatment recommendations and follow-up discussed with the patient who is agreeable with the plan.  Daughter driving.  Patient knows not to drive until cleared by neurologist.  Resting quite comfortably.  All of her symptoms including her headache she reports have now resolved.  Fully alert ambulatory no distress   Sharyn Creamer, MD 06/02/20 2129

## 2020-06-05 ENCOUNTER — Encounter: Payer: Self-pay | Admitting: Internal Medicine

## 2020-06-05 ENCOUNTER — Ambulatory Visit (INDEPENDENT_AMBULATORY_CARE_PROVIDER_SITE_OTHER): Payer: Commercial Managed Care - PPO | Admitting: Internal Medicine

## 2020-06-05 ENCOUNTER — Other Ambulatory Visit: Payer: Self-pay

## 2020-06-05 VITALS — BP 145/84 | HR 90 | Wt 218.0 lb

## 2020-06-05 DIAGNOSIS — R569 Unspecified convulsions: Secondary | ICD-10-CM | POA: Diagnosis not present

## 2020-06-05 DIAGNOSIS — I6930 Unspecified sequelae of cerebral infarction: Secondary | ICD-10-CM | POA: Diagnosis not present

## 2020-06-05 DIAGNOSIS — G43019 Migraine without aura, intractable, without status migrainosus: Secondary | ICD-10-CM

## 2020-06-05 DIAGNOSIS — I119 Hypertensive heart disease without heart failure: Secondary | ICD-10-CM | POA: Diagnosis not present

## 2020-06-05 MED ORDER — ZONISAMIDE 100 MG PO CAPS: 100.0000 mg | ORAL_CAPSULE | Freq: Two times a day (BID) | ORAL | 2 refills | Status: DC

## 2020-06-05 NOTE — Assessment & Plan Note (Signed)
Patient has a history of ischemic stroke.she  Is also weak in the left leg and left arm.  I am not sure how much it is residual effect or already new pathology.  Her CT scan did not show any bleed.  Because of the aneurysm I cannot start her on antiplatelet agent.  However we will get an opinion from the neurology as soon as possible.

## 2020-06-05 NOTE — Assessment & Plan Note (Signed)
Pressure is stable today.

## 2020-06-05 NOTE — Assessment & Plan Note (Signed)
Negative CT scan.  She was seen in the emergency room.  Following the left-sided weakness I will send her to neurologist as soon as possible.  She has evidence of weakness of the left side.

## 2020-06-05 NOTE — Progress Notes (Signed)
Established Patient Office Visit  SUBJECTIVE:  Subjective  Patient ID: Juanna CaoKatina S Mannella, female    DOB: [birth year]  Age: 49 y.o. MRN: [id number]  CC:  Chief Complaint  Patient presents with   Extremity Weakness    Patient was seen in the ED for a recent seizure on Sunday, pt reports weakness on the left side    HPI Juanna CaoKatina S Vessel is a 49 y.o. female presenting today for  A follow up regarding a recent series of seizures.   She [facility] ED via EMS on 06/02/2020 for seizures with headache, slurred speech and left side cramping. She had two seizures while in the presence of EMS. [facility] and is prescribed anti seizure  med, but she had not taken the medication in 3 weeks. Head CT revealed negative head CT. No evidence of acute intracranial abnormality. No evidence of calvarial fracture.   She has been under a significant amount of stress recently due to the loss of some close family members. She has a former history of brain aneurysm.   Seizures  The current episode started more than 2 days ago. The problem has been resolved. There were 6 to 10 seizures. The most recent episode lasted 30 to 120 seconds (06/02/2020). Associated symptoms include headaches, speech difficulty and vomiting. Characteristics include bit tongue. Characteristics do not include rhythmic jerking. The episode was witnessed (By family and EMS). The seizures continued in the ED. Focality: high stress levels. Possible causes include missed seizure meds. There has been no fever.  Extremity Weakness  The pain is present in the left arm, left lower leg, left upper leg, neck and left shoulder. The current episode started in the past 7 days. The problem occurs constantly. The problem has been unchanged. The pain is at a severity of 7/10. Associated symptoms include stiffness (neck/shoulder).   She still has not had her medication since the 06/02/2020 as her pharmacy has still not had the  prescription available for her to take.   She last seen her neurologist, Dr. Stephanie Acreharles Willis on 01/11/2019. She has an appointment with them on 07/02/2020.    Past Medical History:  Diagnosis Date   Brain aneurysm    Common migraine with intractable migraine 03/03/2017   Headache    Hemiplegic migraine 10/07/2015   Late effects of cerebral ischemic stroke 02/09/2018   Left hemiparesis   Seizures (HCC)     Past Surgical History:  Procedure Laterality Date   ABDOMINAL HYSTERECTOMY      Family History  Problem Relation Age of Onset   Stroke Mother    Hypertension Father    Diabetes Father    Migraines Sister    Migraines Brother    Migraines Sister    Migraines Sister    Migraines Sister    Migraines Sister    Migraines Sister    Hypertension Paternal Aunt    Diabetes Paternal Aunt    Stroke Paternal Aunt    Hypertension Paternal Uncle    Diabetes Paternal Uncle    Stroke Paternal Uncle    Hypertension Maternal Grandmother    Aneurysm Paternal Grandmother    Diabetes Paternal Grandmother    Hypertension Paternal Grandfather    Cancer Paternal Grandfather    Seizures Cousin     Social History   Socioeconomic History   Marital status: Married    Spouse name: Not on file   Number of children: 3   Years of education: 2811  Highest education level: Not on file  Occupational History   Occupation: Designer, television/film set  Tobacco Use   Smoking status: Current Every Day Smoker    Packs/day: 0.50    Types: Cigarettes   Smokeless tobacco: Never Used  Substance and Sexual Activity   Alcohol use: Yes    Alcohol/week: 0.0 standard drinks    Comment: occasionally   Drug use: No   Sexual activity: Not on file  Other Topics Concern   Not on file  Social History Narrative   Patient drink about 2 cups of caffeine daily.   Patient is right handed.    Social Determinants of Health   Financial Resource Strain:    Difficulty of  Paying Living Expenses:   Food Insecurity:    Worried About Programme researcher, broadcasting/film/video in the Last Year:    Barista in the Last Year:   Transportation Needs:    Freight forwarder (Medical):    Lack of Transportation (Non-Medical):   Physical Activity:    Days of Exercise per Week:    Minutes of Exercise per Session:   Stress:    Feeling of Stress :   Social Connections:    Frequency of Communication with Friends and Family:    Frequency of Social Gatherings with Friends and Family:    Attends Religious Services:    Active Member of Clubs or Organizations:    Attends Engineer, structural:    Marital Status:   Intimate Partner Violence:    Fear of Current or Ex-Partner:    Emotionally Abused:    Physically Abused:    Sexually Abused:      Current Outpatient Medications:    AJOVY 225 MG/1.5ML SOSY, INJECT 225 MG INTO THE SKIN EVERY 30 DAYS, Disp: 1.5 mL, Rfl: 0   albuterol (PROVENTIL HFA;VENTOLIN HFA) 108 (90 Base) MCG/ACT inhaler, Inhale 2 puffs into the lungs every 6 (six) hours as needed for wheezing or shortness of breath., Disp: 1 Inhaler, Rfl: 0   cyclobenzaprine (FEXMID) 7.5 MG tablet, Take 1 tablet (7.5 mg total) by mouth 3 (three) times daily as needed for muscle spasms., Disp: 30 tablet, Rfl: 0   cyclobenzaprine (FLEXERIL) 10 MG tablet, Take 1 tablet (10 mg total) by mouth 3 (three) times daily as needed for muscle spasms., Disp: 90 tablet, Rfl: 1   zonisamide (ZONEGRAN) 100 MG capsule, Take 1 capsule (100 mg total) by mouth 2 (two) times daily., Disp: 180 capsule, Rfl: 2   Allergies  Allergen Reactions   Amoxicillin Anaphylaxis   Morphine And Related Anaphylaxis   Penicillins Anaphylaxis    Has patient had a PCN reaction causing immediate rash, facial/tongue/throat swelling, SOB or lightheadedness with hypotension: Yes Has patient had a PCN reaction causing severe rash involving mucus membranes or skin necrosis: Yes Has patient  had a PCN reaction that required hospitalization Yes Has patient had a PCN reaction occurring within the last 10 years: Yes If all of the above answers are "NO", then may proceed with Cephalosporin use.    Rocephin [Ceftriaxone Sodium In Dextrose] Anaphylaxis   Shellfish Allergy Anaphylaxis   Tylenol [Acetaminophen] Anaphylaxis   Topamax [Topiramate] Nausea And Vomiting   Tramadol     ROS Review of Systems  Constitutional: Negative.   HENT: Negative.   Eyes: Negative.   Respiratory: Negative.   Cardiovascular: Negative.   Gastrointestinal: Positive for vomiting.  Endocrine: Negative.   Genitourinary: Negative.   Musculoskeletal: Positive for extremity weakness and stiffness (neck/shoulder).  Skin: Negative.   Allergic/Immunologic: Negative.   Neurological: Positive for seizures, speech difficulty and headaches.  Hematological: Negative.   Psychiatric/Behavioral: The patient is nervous/anxious.   All other systems reviewed and are negative.    OBJECTIVE:    Physical Exam Vitals reviewed.  Constitutional:      Appearance: Normal appearance.  HENT:     Mouth/Throat:     Mouth: Mucous membranes are moist.  Eyes:     Pupils: Pupils are equal, round, and reactive to light.  Neck:     Vascular: No carotid bruit.  Cardiovascular:     Rate and Rhythm: Normal rate and regular rhythm.     Pulses: Normal pulses.     Heart sounds: Normal heart sounds.  Pulmonary:     Effort: Pulmonary effort is normal.     Breath sounds: Normal breath sounds.  Abdominal:     General: Bowel sounds are normal.     Palpations: Abdomen is soft. There is no hepatomegaly, splenomegaly or mass.     Tenderness: There is no abdominal tenderness.     Hernia: No hernia is present.  Musculoskeletal:        General: No tenderness.     Cervical back: Neck supple.     Right lower leg: No edema.     Left lower leg: No edema.  Skin:    Findings: Bruising (Right upper arm via IV) present. No rash.    Neurological:     Mental Status: She is alert and oriented to person, place, and time.     Motor: Weakness (left hand, left leg) present.  Psychiatric:        Mood and Affect: Mood and affect normal.        Behavior: Behavior normal.     BP (!) 145/84    Pulse 90    Wt 218 lb (98.9 kg)    BMI 37.42 kg/m  Wt Readings from Last 3 Encounters:  06/05/20 218 lb (98.9 kg)  06/02/20 225 lb (102.1 kg)  01/11/19 210 lb 5 oz (95.4 kg)    Health Maintenance Due  Topic Date Due   Hepatitis C Screening  Never done   COVID-19 Vaccine (1) Never done   HIV Screening  Never done   TETANUS/TDAP  Never done   PAP SMEAR-Modifier  Never done   INFLUENZA VACCINE  05/26/2020    There are no preventive care reminders to display for this patient.  CBC Latest Ref Rng & Units 06/02/2020 07/09/2018 02/09/2017  WBC 4.0 - 10.5 K/uL 14.9(H) 17.0(H) 19.4(H)  Hemoglobin 12.0 - 15.0 g/dL 76.1 95.0 93.2  Hematocrit 36 - 46 % 41.5 39.9 44.0  Platelets 150 - 400 K/uL 366 320 314   CMP Latest Ref Rng & Units 06/02/2020 07/09/2018 02/09/2017  Glucose 70 - 99 mg/dL 671(I) 458(K) 94  BUN 6 - 20 mg/dL 9 12 9   Creatinine 0.44 - 1.00 mg/dL 9.98 3.38)  Sodium 135 - 145 mmol/L 142 143 140  Potassium 3.5 - 5.1 mmol/L 3.1(L) 3.1(L) 3.4(L)  Chloride 98 - 111 mmol/L 105 107 109  CO2 22 - 32 mmol/L 24 28 22   Calcium 8.9 - 10.3 mg/dL 9.2 9.5 9.8  Total Protein 6.5 - 8.1 g/dL - - -  Total Bilirubin 0.3 - 1.2 mg/dL - - -  Alkaline Phos 38 - 126 U/L - - -  AST 15 - 41 U/L - - -  ALT 14 - 54 U/L - - -  No results found for: TSH Lab Results  Component Value Date   ALBUMIN 4.0 10/04/2016   ANIONGAP 13 06/02/2020   No results found for: CHOL, HDL, LDLCALC, CHOLHDL No results found for: TRIG No results found for: HGBA1C    ASSESSMENT & PLAN:   Problem List Items Addressed This Visit      Cardiovascular and Mediastinum   Common migraine with intractable migraine    Negative CT scan.  She was seen in  the emergency room.  Following the left-sided weakness I will send her to neurologist as soon as possible.  She has evidence of weakness of the left side.      Relevant Medications   zonisamide (ZONEGRAN) 100 MG capsule     Other   Seizures (HCC)    1.  Zonegran ordered.  Patient will also advised to return to the ER if the symptoms get worse.      Relevant Medications   zonisamide (ZONEGRAN) 100 MG capsule   Late effects of cerebral ischemic stroke    Patient has a history of ischemic stroke.she  Is also weak in the left leg and left arm.  I am not sure how much it is residual effect or already new pathology.  Her CT scan did not show any bleed.  Because of the aneurysm I cannot start her on antiplatelet agent.  However we will get an opinion from the neurology as soon as possible.       Other Visit Diagnoses    Seizure (HCC)    -  Primary   Relevant Medications   zonisamide (ZONEGRAN) 100 MG capsule      Meds ordered this encounter  Medications   zonisamide (ZONEGRAN) 100 MG capsule    Sig: Take 1 capsule (100 mg total) by mouth 2 (two) times daily.    Dispense:  180 capsule    Refill:  2     Follow-up: No follow-ups on file.    Dr. Woodroe Chen [facility] [address] [place], [place] [address]   By signing my name below, I, [employer], attest that this documentation has been prepared under the direction and in the presence of Corky Downs, MD. Electronically Signed: Corky Downs, MD 06/05/20, 8:15 PM   I personally performed the services described in this documentation, which was SCRIBED in my presence. The recorded information has been reviewed and considered accurate. It has been edited as necessary during review. Corky Downs, MD

## 2020-06-05 NOTE — Assessment & Plan Note (Signed)
1.  Zonegran ordered.  Patient will also advised to return to the ER if the symptoms get worse.

## 2020-06-12 ENCOUNTER — Encounter: Payer: Self-pay | Admitting: Internal Medicine

## 2020-06-12 ENCOUNTER — Ambulatory Visit (INDEPENDENT_AMBULATORY_CARE_PROVIDER_SITE_OTHER): Payer: Commercial Managed Care - PPO | Admitting: Internal Medicine

## 2020-06-12 ENCOUNTER — Other Ambulatory Visit: Payer: Self-pay

## 2020-06-12 VITALS — BP 123/75 | HR 96 | Temp 97.2°F | Ht 64.0 in | Wt 217.0 lb

## 2020-06-12 DIAGNOSIS — I6930 Unspecified sequelae of cerebral infarction: Secondary | ICD-10-CM

## 2020-06-12 DIAGNOSIS — I119 Hypertensive heart disease without heart failure: Secondary | ICD-10-CM | POA: Diagnosis not present

## 2020-06-12 DIAGNOSIS — R569 Unspecified convulsions: Secondary | ICD-10-CM | POA: Diagnosis not present

## 2020-06-12 DIAGNOSIS — I611 Nontraumatic intracerebral hemorrhage in hemisphere, cortical: Secondary | ICD-10-CM

## 2020-06-12 DIAGNOSIS — Z8673 Personal history of transient ischemic attack (TIA), and cerebral infarction without residual deficits: Secondary | ICD-10-CM

## 2020-06-12 NOTE — Assessment & Plan Note (Signed)
  Patient seasonal is under control.  He is taking her medications she will be referred to neurologist. Was advised to lose weight. Denies any history of drinking or smoking.

## 2020-06-12 NOTE — Assessment & Plan Note (Addendum)
Weak In the left arm left leg.  But is able to walk without the help of a stick.  Denies any history of headache

## 2020-06-12 NOTE — Assessment & Plan Note (Signed)
Patient CT scan last week was normal.  She is still weak in the left hand and left leg.  But she is able to ambulate.  She is getting physical therapy.  Left-sided weak is old and she is getting physical therapy for that .MRIWith contrast  Was ordered to further evaluate her problem she will be referred to the neurologist for further evaluation.

## 2020-06-12 NOTE — Assessment & Plan Note (Signed)
Blood pressure is under control on today's visit.  She denies chest pain shortness of breath or swelling of the leg.

## 2020-06-12 NOTE — Progress Notes (Addendum)
Established Patient Office Visit  SUBJECTIVE:  Subjective  Patient ID: [name], female    DOB: 02/02/1971  Age: 49 y.o. MRN: [id number]  CC:  Chief Complaint  Patient presents with  . Seizures    HPI [name] is a 49 y.o. female presenting today for a follow up to her recent seizures.  Her follow up appointment with [facility] is on 08/09/2020.    She says she feels jittery, more so at night. She said her husband said she has been shaking occasionally at night; she was not aware of this. She notes continual weakness on her left side.   She noticed a tightness in her chest when she first started taking her medication again, but she said that it has been easing up the more that she has been taking it.   Past Medical History:  Diagnosis Date  . Brain aneurysm   . Common migraine with intractable migraine 03/03/2017  . Headache   . Hemiplegic migraine 10/07/2015  . 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 Seizures (HCC)     Past Surgical History:  Procedure Laterality Date  . ABDOMINAL HYSTERECTOMY      Family History  Problem Relation Age of Onset  . Stroke Mother   . Hypertension Father   . Diabetes Father   . Migraines Sister   . Migraines Brother   . Migraines Sister   . Migraines Sister   . Migraines Sister   . Migraines Sister   . Migraines Sister   . Hypertension Paternal Aunt   . Diabetes Paternal Aunt   . Stroke Paternal Aunt   . Hypertension Paternal Uncle   . Diabetes Paternal Uncle   . Stroke Paternal Uncle   . Hypertension Maternal Grandmother   . Aneurysm Paternal Grandmother   . Diabetes Paternal Grandmother   . Hypertension Paternal Grandfather   . Cancer Paternal Grandfather   . Seizures Cousin     Social History   Socioeconomic History  . Marital status: Married    Spouse name: Not on file  . Number of children: 3  . Years of education: 49  . Highest education level:  Not on file  Occupational History  . Occupation: Designer, television/film set  Tobacco Use  . Smoking status: Current Every Day Smoker    Packs/day: 0.50    Types: Cigarettes  . Smokeless tobacco: Never Used  Substance and Sexual Activity  . Alcohol use: Yes    Alcohol/week: 0.0 standard drinks    Comment: occasionally  . Drug use: No  . Sexual activity: Not on file  Other Topics Concern  . Not on file  Social History Narrative   Patient drink about 2 cups of caffeine daily.   Patient is right handed.    Social Determinants of Health   Financial Resource Strain:   . Difficulty of Paying Living Expenses: Not on file  Food Insecurity:   . Worried About Programme researcher, broadcasting/film/video in the Last Year: Not on file  . Ran Out of Food in the Last Year: Not on file  Transportation Needs:   . Lack of Transportation (Medical): Not on file  . Lack of Transportation (Non-Medical): Not on file  Physical Activity:   . Days of Exercise per Week: Not on file  . Minutes of Exercise per Session: Not on file  Stress:   . Feeling of Stress : Not on file  Social Connections:   .  Frequency of Communication with Friends and Family: Not on file  . Frequency of Social Gatherings with Friends and Family: Not on file  . Attends Religious Services: Not on file  . Active Member of Clubs or Organizations: Not on file  . Attends Banker Meetings: Not on file  . Marital Status: Not on file  Intimate Partner Violence:   . Fear of Current or Ex-Partner: Not on file  . Emotionally Abused: Not on file  . Physically Abused: Not on file  . Sexually Abused: Not on file     Current Outpatient Medications:  .  AJOVY 225 MG/1.5ML SOSY, INJECT 225 MG INTO THE SKIN EVERY 30 DAYS, Disp: 1.5 mL, Rfl: 0 .  albuterol (PROVENTIL HFA;VENTOLIN HFA) 108 (90 Base) MCG/ACT inhaler, Inhale 2 puffs into the lungs every 6 (six) hours as needed for wheezing or shortness of breath., Disp: 1 Inhaler, Rfl: 0 .  cyclobenzaprine  (FEXMID) 7.5 MG tablet, Take 1 tablet (7.5 mg total) by mouth 3 (three) times daily as needed for muscle spasms., Disp: 30 tablet, Rfl: 0 .  cyclobenzaprine (FLEXERIL) 10 MG tablet, Take 1 tablet (10 mg total) by mouth 3 (three) times daily as needed for muscle spasms., Disp: 90 tablet, Rfl: 1 .  zonisamide (ZONEGRAN) 100 MG capsule, Take 1 capsule (100 mg total) by mouth 2 (two) times daily., Disp: 180 capsule, Rfl: 2   Allergies  Allergen Reactions  . Amoxicillin Anaphylaxis  . Morphine And Related Anaphylaxis  . Penicillins Anaphylaxis    Has patient had a PCN reaction causing immediate rash, facial/tongue/throat swelling, SOB or lightheadedness with hypotension: Yes Has patient had a PCN reaction causing severe rash involving mucus membranes or skin necrosis: Yes Has patient had a PCN reaction that required hospitalization Yes Has patient had a PCN reaction occurring within the last 10 years: Yes If all of the above answers are "NO", then may proceed with Cephalosporin use.   . Rocephin [Ceftriaxone Sodium In Dextrose] Anaphylaxis  . Shellfish Allergy Anaphylaxis  . Tylenol [Acetaminophen] Anaphylaxis  . Topamax [Topiramate] Nausea And Vomiting  . Tramadol     ROS Review of Systems  Constitutional: Negative.   HENT: Negative.   Eyes: Negative.   Respiratory: Positive for chest tightness. Negative for shortness of breath.   Cardiovascular: Negative.  Negative for chest pain.  Gastrointestinal: Negative.   Endocrine: Negative.   Genitourinary: Negative.   Musculoskeletal: Negative.   Skin: Negative.   Allergic/Immunologic: Negative.   Neurological: Positive for seizures (Per husband - potentially in sleep), weakness and headaches (unchanged). Negative for dizziness and numbness.  Hematological: Negative.   Psychiatric/Behavioral: Negative.   All other systems reviewed and are negative.    OBJECTIVE:    Physical Exam Vitals reviewed.  Constitutional:      Appearance:  Normal appearance.  HENT:     Mouth/Throat:     Mouth: Mucous membranes are moist.  Eyes:     Pupils: Pupils are equal, round, and reactive to light.     Comments: Essential Blepharospasm Hemifacial Spasm  Neck:     Vascular: No carotid bruit.  Cardiovascular:     Rate and Rhythm: Normal rate and regular rhythm.     Pulses: Normal pulses.     Heart sounds: Normal heart sounds.  Pulmonary:     Effort: Pulmonary effort is normal.     Breath sounds: Normal breath sounds.  Abdominal:     General: Bowel sounds are normal.     Palpations:  Abdomen is soft. There is no hepatomegaly, splenomegaly or mass.     Tenderness: There is no abdominal tenderness.     Hernia: No hernia is present.  Musculoskeletal:        General: No tenderness.     Cervical back: Neck supple.     Right lower leg: No edema.     Left lower leg: No edema.  Skin:    Findings: No rash.  Neurological:     Mental Status: She is alert and oriented to person, place, and time.     Motor: Weakness (left side) present.  Psychiatric:        Mood and Affect: Mood and affect normal.        Behavior: Behavior normal.     BP 123/75   Pulse 96   Temp (!) 97.2 F (36.2 C)   Ht 5\' 4"  (1.626 m)   Wt 217 lb (98.4 kg)   BMI 37.25 kg/m  Wt Readings from Last 3 Encounters:  06/12/20 217 lb (98.4 kg)  06/05/20 218 lb (98.9 kg)  06/02/20 225 lb (102.1 kg)    Health Maintenance Due  Topic Date Due  . Hepatitis C Screening  Never done  . COVID-19 Vaccine (1) Never done  . HIV Screening  Never done  . TETANUS/TDAP  Never done  . PAP SMEAR-Modifier  Never done  . INFLUENZA VACCINE  05/26/2020    There are no preventive care reminders to display for this patient.  CBC Latest Ref Rng & Units 06/02/2020 07/09/2018 02/09/2017  WBC 4.0 - 10.5 K/uL 14.9(H) 17.0(H) 19.4(H)  Hemoglobin 12.0 - 15.0 g/dL 02/11/2017 66.5 99.3  Hematocrit 36 - 46 % 41.5 39.9 44.0  Platelets 150 - 400 K/uL 366 320 314   CMP Latest Ref Rng & Units  06/02/2020 07/09/2018 02/09/2017  Glucose 70 - 99 mg/dL 02/11/2017) 177(L) 94  BUN 6 - 20 mg/dL 9 12 9   Creatinine 0.44 - 1.00 mg/dL 390(Z 0.09)  Sodium 135 - 145 mmol/L 142 143 140  Potassium 3.5 - 5.1 mmol/L 3.1(L) 3.1(L) 3.4(L)  Chloride 98 - 111 mmol/L 105 107 109  CO2 22 - 32 mmol/L 24 28 22   Calcium 8.9 - 10.3 mg/dL 9.2 9.5 9.8  Total Protein 6.5 - 8.1 g/dL - - -  Total Bilirubin 0.3 - 1.2 mg/dL - - -  Alkaline Phos 38 - 126 U/L - - -  AST 15 - 41 U/L - - -  ALT 14 - 54 U/L - - -    No results found for: TSH Lab Results  Component Value Date   ALBUMIN 4.0 10/04/2016   ANIONGAP 13 06/02/2020   No results found for: CHOL, HDL, LDLCALC, CHOLHDL No results found for: TRIG No results found for: HGBA1C    ASSESSMENT & PLAN:   Problem List Items Addressed This Visit      Cardiovascular and Mediastinum   Hypertensive heart disease without heart failure    Blood pressure is under control on today's visit.  She denies chest pain shortness of breath or swelling of the leg.        Nervous and Auditory   Nontraumatic cortical hemorrhage of right cerebral hemisphere [facility]) - Primary    Patient CT scan last week was normal.  She is still weak in the left hand and left leg.  But she is able to ambulate.  She is getting physical therapy.  Left-sided weak is old and she is getting physical therapy for  that .MRIWith contrast  Was ordered to further evaluate her problem she will be referred to the neurologist for further evaluation.      Relevant Orders   MR Brain Wo Contrast   Hx of cerebral infarction    Weak In the left arm left leg.  But is able to walk without the help of a stick.  Denies any history of headache        Other   Seizures (HCC)     Patient seasonal is under control.  He is taking her medications she will be referred to neurologist. Was advised to lose weight. Denies any history of drinking or smoking.          No orders of the defined types were placed  in this encounter.     Follow-up: No follow-ups on file.    Dr. Woodroe ChenJaved Jyl [facility] [address], [place], [place] [id number]   By signing my name below, I, [employer], attest that this documentation has been prepared under the direction and in the presence of Corky DownsMasoud, Ab Leaming, MD. Electronically Signed: Corky DownsJaved Joreen Swearingin, MD 06/24/20, 5:14 PM   I personally performed the services described in this documentation, which was SCRIBED in my presence. The recorded information has been reviewed and considered accurate. It has been edited as necessary during review. Corky DownsJaved Justun Anaya, MD

## 2020-06-12 NOTE — Progress Notes (Signed)
[name] is a 49 y.o. female here for a hospital f/u  Pt has had no new sx

## 2020-06-19 ENCOUNTER — Ambulatory Visit: Payer: Commercial Managed Care - PPO | Admitting: Internal Medicine

## 2020-06-24 DIAGNOSIS — Z8673 Personal history of transient ischemic attack (TIA), and cerebral infarction without residual deficits: Secondary | ICD-10-CM | POA: Insufficient documentation

## 2020-06-24 NOTE — Assessment & Plan Note (Signed)
Weak In the left arm left leg.  But is able to walk without the help of a stick.  Denies any history of headache 

## 2020-07-02 ENCOUNTER — Institutional Professional Consult (permissible substitution): Payer: Commercial Managed Care - PPO | Admitting: Neurology

## 2020-07-08 ENCOUNTER — Ambulatory Visit
Admission: RE | Admit: 2020-07-08 | Discharge: 2020-07-08 | Disposition: A | Discharge status: Home / Self Care | Payer: Commercial Managed Care - PPO | Source: Ambulatory Visit | Attending: Internal Medicine | Admitting: Internal Medicine

## 2020-07-08 ENCOUNTER — Other Ambulatory Visit: Payer: Self-pay

## 2020-07-08 DIAGNOSIS — I611 Nontraumatic intracerebral hemorrhage in hemisphere, cortical: Secondary | ICD-10-CM | POA: Insufficient documentation

## 2020-07-10 ENCOUNTER — Other Ambulatory Visit: Payer: Self-pay

## 2020-07-10 ENCOUNTER — Ambulatory Visit (INDEPENDENT_AMBULATORY_CARE_PROVIDER_SITE_OTHER): Payer: Commercial Managed Care - PPO | Admitting: Internal Medicine

## 2020-07-10 ENCOUNTER — Encounter: Payer: Self-pay | Admitting: Internal Medicine

## 2020-07-10 VITALS — BP 146/89 | HR 82 | Ht 65.0 in | Wt 215.0 lb

## 2020-07-10 DIAGNOSIS — I119 Hypertensive heart disease without heart failure: Secondary | ICD-10-CM

## 2020-07-10 DIAGNOSIS — R569 Unspecified convulsions: Secondary | ICD-10-CM

## 2020-07-10 DIAGNOSIS — I611 Nontraumatic intracerebral hemorrhage in hemisphere, cortical: Secondary | ICD-10-CM | POA: Diagnosis not present

## 2020-07-10 DIAGNOSIS — G4489 Other headache syndrome: Secondary | ICD-10-CM | POA: Diagnosis not present

## 2020-07-10 DIAGNOSIS — R519 Headache, unspecified: Secondary | ICD-10-CM | POA: Insufficient documentation

## 2020-07-10 MED ORDER — AJOVY 225 MG/1.5ML ~~LOC~~ SOSY: 30.0000 mg | PREFILLED_SYRINGE | SUBCUTANEOUS | 0 refills | Status: DC

## 2020-07-10 MED ORDER — ZONISAMIDE 100 MG PO CAPS: 100.0000 mg | ORAL_CAPSULE | Freq: Two times a day (BID) | ORAL | 0 refills | Status: DC

## 2020-07-10 MED ORDER — LISINOPRIL 20 MG PO TABS: 20.0000 mg | ORAL_TABLET | Freq: Every day | ORAL | 5 refills | Status: DC

## 2020-07-10 NOTE — Progress Notes (Signed)
Established Patient Office Visit  SUBJECTIVE:  Subjective  Patient ID: [name], female    DOB: February 14, 1971  Age: 49 y.o. MRN: [id number]  CC:  Chief Complaint  Patient presents with  . CT results    HPI [name] is a 49 y.o. female presenting today to discuss recent imaging studies.  06/02/2020 Head CT revealed negative head CT. No evidence of acute intracranial abnormality. No evidence of calvarial fracture.  07/08/2020 Head MRI revealed no acute intracranial abnormality. Stable MRI appearance of the brain since 2019 with chronic encephalomalacia and hemosiderin along the right parieto-occipital sulcus and splenium of the corpus callosum. No evidence of mesial temporal sclerosis.  She continues to have weakness on her left side. She says that sometimes she has pinprick sensation in her left arm; these are worse with her migraines. She states that her husband says she continues to have seizures in her sleep; she will jerk and shake at night. She says she wakes up tired.   Her appointment with Neurology is on 08/09/2020.    Past Medical History:  Diagnosis Date  . Brain aneurysm   . Common migraine with intractable migraine 03/03/2017  . Headache   . Hemiplegic migraine 10/07/2015  . 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 Seizures (HCC)     Past Surgical History:  Procedure Laterality Date  . ABDOMINAL HYSTERECTOMY      Family History  Problem Relation Age of Onset  . Stroke Mother   . Hypertension Father   . Diabetes Father   . Migraines Sister   . Migraines Brother   . Migraines Sister   . Migraines Sister   . Migraines Sister   . Migraines Sister   . Migraines Sister   . Hypertension Paternal Aunt   . Diabetes Paternal Aunt   . Stroke Paternal Aunt   . Hypertension Paternal Uncle   . Diabetes Paternal Uncle   . Stroke Paternal Uncle   . Hypertension Maternal Grandmother   . Aneurysm Paternal Grandmother   .  Diabetes Paternal Grandmother   . Hypertension Paternal Grandfather   . Cancer Paternal Grandfather   . Seizures Cousin     Social History   Socioeconomic History  . Marital status: Married    Spouse name: Not on file  . Number of children: 3  . Years of education: 43  . Highest education level: Not on file  Occupational History  . Occupation: Designer, television/film set  Tobacco Use  . Smoking status: Current Every Day Smoker    Packs/day: 0.50    Types: Cigarettes  . Smokeless tobacco: Never Used  Substance and Sexual Activity  . Alcohol use: Yes    Alcohol/week: 0.0 standard drinks    Comment: occasionally  . Drug use: No  . Sexual activity: Not on file  Other Topics Concern  . Not on file  Social History Narrative   Patient drink about 2 cups of caffeine daily.   Patient is right handed.    Social Determinants of Health   Financial Resource Strain:   . Difficulty of Paying Living Expenses: Not on file  Food Insecurity:   . Worried About Programme researcher, broadcasting/film/video in the Last Year: Not on file  . Ran Out of Food in the Last Year: Not on file  Transportation Needs:   . Lack of Transportation (Medical): Not on file  . Lack of Transportation (Non-Medical): Not on file  Physical Activity:   .  Days of Exercise per Week: Not on file  . Minutes of Exercise per Session: Not on file  Stress:   . Feeling of Stress : Not on file  Social Connections:   . Frequency of Communication with Friends and Family: Not on file  . Frequency of Social Gatherings with Friends and Family: Not on file  . Attends Religious Services: Not on file  . Active Member of Clubs or Organizations: Not on file  . Attends BankerClub or Organization Meetings: Not on file  . Marital Status: Not on file  Intimate Partner Violence:   . Fear of Current or Ex-Partner: Not on file  . Emotionally Abused: Not on file  . Physically Abused: Not on file  . Sexually Abused: Not on file     Current Outpatient Medications:    .  albuterol (PROVENTIL HFA;VENTOLIN HFA) 108 (90 Base) MCG/ACT inhaler, Inhale 2 puffs into the lungs every 6 (six) hours as needed for wheezing or shortness of breath., Disp: 1 Inhaler, Rfl: 0 .  cyclobenzaprine (FEXMID) 7.5 MG tablet, Take 1 tablet (7.5 mg total) by mouth 3 (three) times daily as needed for muscle spasms., Disp: 30 tablet, Rfl: 0 .  cyclobenzaprine (FLEXERIL) 10 MG tablet, Take 1 tablet (10 mg total) by mouth 3 (three) times daily as needed for muscle spasms., Disp: 90 tablet, Rfl: 1 .  Fremanezumab-vfrm (AJOVY) 225 MG/1.5ML SOSY, Inject 30 mg into the muscle every 30 (thirty) days., Disp: 1.5 mL, Rfl: 0 .  zonisamide (ZONEGRAN) 100 MG capsule, Take 1 capsule (100 mg total) by mouth 2 (two) times daily., Disp: 60 capsule, Rfl: 0 .  lisinopril (ZESTRIL) 20 MG tablet, Take 1 tablet (20 mg total) by mouth daily., Disp: 30 tablet, Rfl: 5   Allergies  Allergen Reactions  . Amoxicillin Anaphylaxis  . Morphine And Related Anaphylaxis  . Penicillins Anaphylaxis    Has patient had a PCN reaction causing immediate rash, facial/tongue/throat swelling, SOB or lightheadedness with hypotension: Yes Has patient had a PCN reaction causing severe rash involving mucus membranes or skin necrosis: Yes Has patient had a PCN reaction that required hospitalization Yes Has patient had a PCN reaction occurring within the last 10 years: Yes If all of the above answers are "NO", then may proceed with Cephalosporin use.   . Rocephin [Ceftriaxone Sodium In Dextrose] Anaphylaxis  . Shellfish Allergy Anaphylaxis  . Tylenol [Acetaminophen] Anaphylaxis  . Topamax [Topiramate] Nausea And Vomiting  . Tramadol     ROS Review of Systems  Constitutional: Negative.   HENT: Negative.   Eyes: Negative.   Respiratory: Negative.   Cardiovascular: Negative.   Gastrointestinal: Negative.   Endocrine: Negative.   Genitourinary: Negative.   Musculoskeletal: Negative.   Skin: Negative.    Allergic/Immunologic: Negative.   Neurological: Positive for seizures (As reported by husband - jerking, shaking), weakness, numbness and headaches.  Hematological: Negative.   Psychiatric/Behavioral: Negative.   All other systems reviewed and are negative.    OBJECTIVE:    Physical Exam Vitals reviewed.  Constitutional:      Appearance: Normal appearance.  HENT:     Mouth/Throat:     Mouth: Mucous membranes are moist.  Eyes:     Pupils: Pupils are equal, round, and reactive to light.  Neck:     Vascular: No carotid bruit.  Cardiovascular:     Rate and Rhythm: Normal rate and regular rhythm.     Pulses: Normal pulses.     Heart sounds: Normal heart sounds.  Pulmonary:     Effort: Pulmonary effort is normal.     Breath sounds: Normal breath sounds.  Abdominal:     General: Bowel sounds are normal.     Palpations: Abdomen is soft. There is no hepatomegaly, splenomegaly or mass.     Tenderness: There is no abdominal tenderness.     Hernia: No hernia is present.  Musculoskeletal:        General: No tenderness.     Cervical back: Neck supple.     Right lower leg: No edema.     Left lower leg: No edema.  Skin:    Findings: No rash.  Neurological:     Mental Status: She is alert and oriented to person, place, and time.     Motor: Weakness (L side) present.     Gait: Gait abnormal (L side shuffle).  Psychiatric:        Mood and Affect: Mood and affect normal.        Behavior: Behavior normal.     BP (!) 146/89   Pulse 82   Ht 5\' 5"  (1.651 m)   Wt 215 lb (97.5 kg)   BMI 35.78 kg/m  Wt Readings from Last 3 Encounters:  07/10/20 215 lb (97.5 kg)  06/12/20 217 lb (98.4 kg)  06/05/20 218 lb (98.9 kg)    Health Maintenance Due  Topic Date Due  . Hepatitis C Screening  Never done  . COVID-19 Vaccine (1) Never done  . HIV Screening  Never done  . TETANUS/TDAP  Never done  . PAP SMEAR-Modifier  Never done  . INFLUENZA VACCINE  Never done    There are no  preventive care reminders to display for this patient.  CBC Latest Ref Rng & Units 06/02/2020 07/09/2018 02/09/2017  WBC 4.0 - 10.5 K/uL 14.9(H) 17.0(H) 19.4(H)  Hemoglobin 12.0 - 15.0 g/dL 02/11/2017 14.4 31.5  Hematocrit 36 - 46 % 41.5 39.9 44.0  Platelets 150 - 400 K/uL 366 320 314   CMP Latest Ref Rng & Units 06/02/2020 07/09/2018 02/09/2017  Glucose 70 - 99 mg/dL 02/11/2017) 867(Y) 94  BUN 6 - 20 mg/dL 9 12 9   Creatinine 0.44 - 1.00 mg/dL 195(K 9.32)  Sodium 135 - 145 mmol/L 142 143 140  Potassium 3.5 - 5.1 mmol/L 3.1(L) 3.1(L) 3.4(L)  Chloride 98 - 111 mmol/L 105 107 109  CO2 22 - 32 mmol/L 24 28 22   Calcium 8.9 - 10.3 mg/dL 9.2 9.5 9.8  Total Protein 6.5 - 8.1 g/dL - - -  Total Bilirubin 0.3 - 1.2 mg/dL - - -  Alkaline Phos 38 - 126 U/L - - -  AST 15 - 41 U/L - - -  ALT 14 - 54 U/L - - -    No results found for: TSH Lab Results  Component Value Date   ALBUMIN 4.0 10/04/2016   ANIONGAP 13 06/02/2020   No results found for: CHOL, HDL, LDLCALC, CHOLHDL No results found for: TRIG No results found for: HGBA1C    ASSESSMENT & PLAN:   Problem List Items Addressed This Visit      Cardiovascular and Mediastinum   Hypertensive heart disease without heart failure    - Today, the patient's blood pressure is well managed on lisinopril. - The patient will continue the current treatment regimen.  - I encouraged the patient to eat a low-sodium diet to help control blood pressure. - I encouraged the patient to live an active lifestyle and complete activities that increases  heart rate to 85% target heart rate at least 5 times per week for one hour.          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 She was given one refill until she sees her neurologist. She was advised not to work for another month  until cleared by Neurology.         Other   Seizures (HCC) - Primary    Resume seizure medicine. Refer back to Neurology.       Relevant Medications   Fremanezumab-vfrm (AJOVY) 225 MG/1.5ML SOSY   zonisamide (ZONEGRAN) 100 MG capsule   Headache    Flexaril is helping her headaches. She was started back on her seizure medicine, which will also help her relieve her headache.       Relevant Medications   Fremanezumab-vfrm (AJOVY) 225 MG/1.5ML SOSY   zonisamide (ZONEGRAN) 100 MG capsule    Other Visit Diagnoses    Seizure (HCC)       Relevant Medications   Fremanezumab-vfrm (AJOVY) 225 MG/1.5ML SOSY   zonisamide (ZONEGRAN) 100 MG capsule      Meds ordered this encounter  Medications  . Fremanezumab-vfrm (AJOVY) 225 MG/1.5ML SOSY    Sig: Inject 30 mg into the muscle every 30 (thirty) days.    Dispense:  1.5 mL    Refill:  0    Pt needs to call [phone] to schedule f/u for ongoing refills  . zonisamide (ZONEGRAN) 100 MG capsule    Sig: Take 1 capsule (100 mg total) by mouth 2 (two) times daily.    Dispense:  60 capsule    Refill:  0  . lisinopril (ZESTRIL) 20 MG tablet    Sig: Take 1 tablet (20 mg total) by mouth daily.    Dispense:  30 tablet    Refill:  5    Follow-up: No follow-ups on file.    Corky Downs, MD [facility] [address], [place], [place] [address]   By signing my name below, I, [employer], attest that this documentation has been prepared under the direction and in the presence of Dr. Corky Downs Electronically Signed: Corky Downs, MD 07/10/20, 11:51 AM  I personally performed the services described in this documentation, which was SCRIBED in my presence. The recorded information has been reviewed and considered accurate. It has been edited as necessary during review. Corky Downs, MD

## 2020-07-10 NOTE — Assessment & Plan Note (Signed)
Resume seizure medicine. Refer back to Neurology.

## 2020-07-10 NOTE — Assessment & Plan Note (Signed)
-   Today, the patient's blood pressure is well managed on lisinopril. - The patient will continue the current treatment regimen.  - I encouraged the patient to eat a low-sodium diet to help control blood pressure. - I encouraged the patient to live an active lifestyle and complete activities that increases heart rate to 85% target heart rate at least 5 times per week for one hour.     

## 2020-07-10 NOTE — Assessment & Plan Note (Signed)
Flexaril is helping her headaches. She was started back on her seizure medicine, which will also help her relieve her headache.

## 2020-07-10 NOTE — Assessment & Plan Note (Signed)
Pt is still having seizures, but she ran out of her seizure medication. She was given one refill until she sees her neurologist. She was advised not to work for another month until cleared by Neurology.

## 2020-08-09 ENCOUNTER — Ambulatory Visit: Payer: Commercial Managed Care - PPO | Admitting: Neurology

## 2020-08-09 ENCOUNTER — Encounter: Payer: Self-pay | Admitting: Neurology

## 2020-08-09 ENCOUNTER — Other Ambulatory Visit: Payer: Self-pay

## 2020-08-09 VITALS — BP 124/78 | HR 83 | Ht 61.0 in | Wt 213.0 lb

## 2020-08-09 DIAGNOSIS — G43019 Migraine without aura, intractable, without status migrainosus: Secondary | ICD-10-CM

## 2020-08-09 DIAGNOSIS — G894 Chronic pain syndrome: Secondary | ICD-10-CM

## 2020-08-09 DIAGNOSIS — R569 Unspecified convulsions: Secondary | ICD-10-CM

## 2020-08-09 DIAGNOSIS — G8929 Other chronic pain: Secondary | ICD-10-CM | POA: Insufficient documentation

## 2020-08-09 MED ORDER — AJOVY 225 MG/1.5ML ~~LOC~~ SOSY: 30.0000 mg | PREFILLED_SYRINGE | SUBCUTANEOUS | 5 refills | Status: DC

## 2020-08-09 MED ORDER — CARBAMAZEPINE 200 MG PO TABS
ORAL_TABLET | ORAL | 3 refills | Status: DC
Start: 2020-08-09 — End: 2020-08-14

## 2020-08-09 MED ORDER — ZONISAMIDE 100 MG PO CAPS: ORAL_CAPSULE | ORAL | 3 refills | Status: DC

## 2020-08-09 NOTE — Patient Instructions (Signed)
We will start carbamazepine for the left sided pain and for seizures.  Tegretol (carbamazepine) may result in dizziness, gait instability, cognitive slowing, or drowsiness. Sometimes, and allergic rash may occur, or a photosensitive rash may occur. If any significant side effects are noted, please contact our office.

## 2020-08-09 NOTE — Progress Notes (Signed)
Reason for visit: Seizures, headache, left-sided pain, left hemiparesis  Referring physician: [place]  [name] is a 49 y.o. female  History of present illness:  [name] is a 49 year old right-handed black female with a history of a right parietal intracranial hemorrhage in 2009 resulting in a left hemiparesis.  The patient claims that immediately after this event she noted onset of left-sided headache, left arm and leg pain and discomfort.  The patient has had a chronic left hemiparesis.  She has been on Zonegran for her seizures.  She had stopped her medications at some point over the summer 2021, she was seen in the emergency room on 02 June 2020 with recurrent seizure events.  The patient had some increased weakness on the left side following the seizures that has persisted.  She has been out of work apparently since the beginning of the year in 2021 due to some problems with left-sided dysfunction.  She is back on Zonegran take 100 mg in the morning and 200 mg in the evening.  She has not had any generalized type seizures but she does have some jerks and twitches on the left side that occur mainly at nighttime.  She may have episodes of feeling "foggy headed" and is not sure whether or not this represents a seizure or not.  She does not operate a motor vehicle.  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  She indicates that the headache issue came on at the same time as the left-sided pain syndrome.  She does have occasional falls, she denies issues controlling the bowels or the bladder.  With the headache, she may have some photophobia and phonophobia, she may see spots in front of the eyes.  Botox was not helpful, she has been on a multitude of medications previously without benefit, Ajovy offers minimal benefit for the headache.  In the past, the use of gabapentin and Lyrica together has offered some improvement in the left-sided pain.  She comes back  here for further evaluation.  Past Medical History:  Diagnosis Date  . Brain aneurysm   . Common migraine with intractable migraine 03/03/2017  . Headache   . Hemiplegic migraine 10/07/2015  . 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 Seizures (HCC)     Past Surgical History:  Procedure Laterality Date  . ABDOMINAL HYSTERECTOMY      Family History  Problem Relation Age of Onset  . Stroke Mother   . Hypertension Father   . Diabetes Father   . Migraines Sister   . Migraines Brother   . Migraines Sister   . Migraines Sister   . Migraines Sister   . Migraines Sister   . Migraines Sister   . Hypertension Paternal Aunt   . Diabetes Paternal Aunt   . Stroke Paternal Aunt   . Hypertension Paternal Uncle   . Diabetes Paternal Uncle   . Stroke Paternal Uncle   . Hypertension Maternal Grandmother   . Aneurysm Paternal Grandmother   . Diabetes Paternal Grandmother   . Hypertension Paternal Grandfather   . Cancer Paternal Grandfather   . Seizures Cousin     Social history:  reports that she has been smoking cigarettes. She has been smoking about 0.50 packs per day. She has never used smokeless tobacco. She reports current alcohol use. She reports that she does not use drugs.  Medications:  Prior to Admission medications  Medication Sig Start Date End Date Taking? Authorizing Provider  albuterol (PROVENTIL HFA;VENTOLIN HFA) 108 (90 Base) MCG/ACT inhaler Inhale 2 puffs into the lungs every 6 (six) hours as needed for wheezing or shortness of breath. 04/12/18  Yes Darci Current, MD  cyclobenzaprine (FEXMID) 7.5 MG tablet Take 1 tablet (7.5 mg total) by mouth 3 (three) times daily as needed for muscle spasms. 06/02/20  Yes Sharyn Creamer, MD  cyclobenzaprine (FLEXERIL) 10 MG tablet Take 1 tablet (10 mg total) by mouth 3 (three) times daily as needed for muscle spasms. 01/11/19  Yes York Spaniel, MD  Fremanezumab-vfrm (AJOVY) 225 MG/1.5ML SOSY Inject  30 mg into the muscle every 30 (thirty) days. 07/10/20  Yes Masoud, Renda Rolls, MD  lisinopril (ZESTRIL) 20 MG tablet Take 1 tablet (20 mg total) by mouth daily. 07/10/20 10/08/20 Yes Masoud, Renda Rolls, MD  zonisamide (ZONEGRAN) 100 MG capsule Take 1 capsule (100 mg total) by mouth 2 (two) times daily. 07/10/20 10/08/20 Yes Corky Downs, MD      Allergies  Allergen Reactions  . Amoxicillin Anaphylaxis  . Morphine And Related Anaphylaxis  . Penicillins Anaphylaxis    Has patient had a PCN reaction causing immediate rash, facial/tongue/throat swelling, SOB or lightheadedness with hypotension: Yes Has patient had a PCN reaction causing severe rash involving mucus membranes or skin necrosis: Yes Has patient had a PCN reaction that required hospitalization Yes Has patient had a PCN reaction occurring within the last 10 years: Yes If all of the above answers are "NO", then may proceed with Cephalosporin use.   . Rocephin [Ceftriaxone Sodium In Dextrose] Anaphylaxis  . Shellfish Allergy Anaphylaxis  . Tylenol [Acetaminophen] Anaphylaxis  . Topamax [Topiramate] Nausea And Vomiting  . Tramadol     ROS:  Out of a complete 14 system review of symptoms, the patient complains only of the following symptoms, and all other reviewed systems are negative.  Seizures Walking difficulty, left-sided weakness Chronic pain, left side  Blood pressure 124/78, pulse 83, height 5\' 1"  (1.549 m), weight 213 lb (96.6 kg).  Physical Exam  General: The patient is alert and cooperative at the time of the examination.  The patient is moderately obese.  Eyes: Pupils are equal, round, and reactive to light. Discs are flat bilaterally.  Neck: The neck is supple, no carotid bruits are noted.  Respiratory: The respiratory examination is clear.  Cardiovascular: The cardiovascular examination reveals a regular rate and rhythm, no obvious murmurs or rubs are noted.  Skin: Extremities are without significant  edema.  Neurologic Exam  Mental status: The patient is alert and oriented x 3 at the time of the examination. The patient has apparent normal recent and remote memory, with an apparently normal attention span and concentration ability.  Cranial nerves: Facial symmetry is present. There is decreased pinprick sensation on the left forehead and face as compared to the right. The strength of the facial muscles and the muscles to head turning and shoulder shrug are normal bilaterally. Speech is well enunciated, no aphasia or dysarthria is noted. Extraocular movements are full. Visual fields are full. The tongue is midline, and the patient has symmetric elevation of the soft palate. No obvious hearing deficits are noted.  Motor: The motor testing reveals 5 over 5 strength of all 4 extremities.  There is some increase in motor tone of the left arm and left leg, pain with movement.  Sensory: Sensory testing is notable for decreased pinprick, vibration sensation on the left arm and legs compared  to right, position sensation is relatively normal in both hands, slightly decreased in the left foot as compared to the right. No evidence of extinction is noted.  Coordination: Cerebellar testing reveals good finger-nose-finger and heel-to-shin bilaterally.  Gait and station: Gait is associated with a very slight circumduction type gait with the left leg.  The patient is able to perform tandem gait.  Romberg is negative.  Reflexes: Deep tendon reflexes are symmetric and normal bilaterally, with exception of slight increase in the left knee jerk reflexes.  Right. Toes are downgoing bilaterally.   MRI brain 07/08/20:  IMPRESSION: 1. No acute intracranial abnormality.  2. Stable MRI appearance of the brain since 2019 with chronic encephalomalacia and hemosiderin along the right parieto-occipital sulcus and splenium of the corpus callosum. No evidence of mesial temporal sclerosis.  * MRI scan images were  reviewed online. I agree with the written report.    Assessment/Plan:  1.  Chronic daily headache  2.  Chronic left pain syndrome following stroke  3.  Mild left hemiparesis  4.  Seizures  The patient is back on zonegran.  She is still having a lot of difficulty with severe pain on the left side of the body, it hurts to use the arm and the leg.  She believes that there has been some increase and left-sided weakness following multiple seizures.  MRI of the brain has shown good stability however.  The patient apparently is out of work through her primary care physician due to the above symptoms.  The patient will be given a prescription for the Zonegran, we will add carbamazepine for the pain and for the seizures.  She will follow-up here in 4 months.  The issue with the headaches appears to have started at the same time as the left body pain, and may be part of a thalamic type pain syndrome.  Marlan Palau MD 08/09/2020 9:23 AM  [facility] [address] [place], [place] [address]  Phone [phone] Fax [phone]

## 2020-08-12 ENCOUNTER — Telehealth: Payer: Self-pay | Admitting: Neurology

## 2020-08-12 DIAGNOSIS — R569 Unspecified convulsions: Secondary | ICD-10-CM

## 2020-08-12 NOTE — Telephone Encounter (Signed)
Pt called, Pharmacy said, do not have prescriptions sent from Dr. Anne Hahn. Would like a call from the nurse.

## 2020-08-14 ENCOUNTER — Telehealth: Payer: Self-pay | Admitting: Neurology

## 2020-08-14 ENCOUNTER — Other Ambulatory Visit: Payer: Self-pay | Admitting: Emergency Medicine

## 2020-08-14 MED ORDER — AJOVY 225 MG/1.5ML ~~LOC~~ SOSY: 225.0000 mg | PREFILLED_SYRINGE | SUBCUTANEOUS | 5 refills | Status: DC

## 2020-08-14 MED ORDER — ZONISAMIDE 100 MG PO CAPS: ORAL_CAPSULE | ORAL | 3 refills | Status: DC

## 2020-08-14 MED ORDER — CARBAMAZEPINE 200 MG PO TABS: ORAL_TABLET | ORAL | 3 refills | Status: DC

## 2020-08-14 MED ORDER — AJOVY 225 MG/1.5ML ~~LOC~~ SOSY: 30.0000 mg | PREFILLED_SYRINGE | SUBCUTANEOUS | 5 refills | Status: DC

## 2020-08-14 NOTE — Telephone Encounter (Signed)
Called [name] back at pharmacy and let her know that the prescription was corrected and resent electronically to the pharmacy.

## 2020-08-14 NOTE — Telephone Encounter (Signed)
[facility]) called need clarification on dosage for Fremanezumab-vfrm (AJOVY) 225 MG/1.5ML SOSY. There is no way to measure 30 mg. Would like a call back.

## 2020-08-14 NOTE — Addendum Note (Signed)
Addended by: Bertram Savin on: 08/14/2020 09:05 AM   Modules accepted: Orders

## 2020-08-14 NOTE — Telephone Encounter (Signed)
The prescriptions were sent on 10/15 but there was an e-scribe error and they did not go through successfully. Prescriptions were sent successfully to [facility]. I called the pt and let her know. She verbalized appreciation.

## 2020-09-06 ENCOUNTER — Ambulatory Visit (INDEPENDENT_AMBULATORY_CARE_PROVIDER_SITE_OTHER): Payer: Commercial Managed Care - PPO | Admitting: Family Medicine

## 2020-09-06 ENCOUNTER — Encounter: Payer: Self-pay | Admitting: Family Medicine

## 2020-09-06 ENCOUNTER — Other Ambulatory Visit: Payer: Self-pay

## 2020-09-06 VITALS — BP 138/88 | HR 68 | Ht 66.0 in | Wt 215.6 lb

## 2020-09-06 DIAGNOSIS — I119 Hypertensive heart disease without heart failure: Secondary | ICD-10-CM | POA: Diagnosis not present

## 2020-09-06 NOTE — Assessment & Plan Note (Signed)
HTN wnl today, no h/a or CP Plan continue current meds.

## 2020-09-06 NOTE — Progress Notes (Signed)
Established Patient Office Visit  SUBJECTIVE:  Subjective  Patient ID: [name], female    DOB: 1971-05-20  Age: 49 y.o. MRN: [id number]  CC:  Chief Complaint  Patient presents with  . Follow-up    HPI [name] is a 49 y.o. female presenting today for htn fu.   Past Medical History:  Diagnosis Date  . Brain aneurysm   . Common migraine with intractable migraine 03/03/2017  . Headache   . Hemiplegic migraine 10/07/2015  . 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 Seizures (HCC)     Past Surgical History:  Procedure Laterality Date  . ABDOMINAL HYSTERECTOMY      Family History  Problem Relation Age of Onset  . Stroke Mother   . Hypertension Father   . Diabetes Father   . Migraines Sister   . Migraines Brother   . Migraines Sister   . Migraines Sister   . Migraines Sister   . Migraines Sister   . Migraines Sister   . Hypertension Paternal Aunt   . Diabetes Paternal Aunt   . Stroke Paternal Aunt   . Hypertension Paternal Uncle   . Diabetes Paternal Uncle   . Stroke Paternal Uncle   . Hypertension Maternal Grandmother   . Aneurysm Paternal Grandmother   . Diabetes Paternal Grandmother   . Hypertension Paternal Grandfather   . Cancer Paternal Grandfather   . Seizures Cousin     Social History   Socioeconomic History  . Marital status: Married    Spouse name: Not on file  . Number of children: 3  . Years of education: 59  . Highest education level: Not on file  Occupational History  . Occupation: Designer, television/film set  Tobacco Use  . Smoking status: Current Every Day Smoker    Packs/day: 0.50    Types: Cigarettes  . Smokeless tobacco: Never Used  Substance and Sexual Activity  . Alcohol use: Yes    Alcohol/week: 0.0 standard drinks    Comment: occasionally  . Drug use: No  . Sexual activity: Not on file  Other Topics Concern  . Not on file  Social History Narrative   Patient drink about 2 cups of  caffeine daily.   Patient is right handed.    Social Determinants of Health   Financial Resource Strain:   . Difficulty of Paying Living Expenses: Not on file  Food Insecurity:   . Worried About Programme researcher, broadcasting/film/video in the Last Year: Not on file  . Ran Out of Food in the Last Year: Not on file  Transportation Needs:   . Lack of Transportation (Medical): Not on file  . Lack of Transportation (Non-Medical): Not on file  Physical Activity:   . Days of Exercise per Week: Not on file  . Minutes of Exercise per Session: Not on file  Stress:   . Feeling of Stress : Not on file  Social Connections:   . Frequency of Communication with Friends and Family: Not on file  . Frequency of Social Gatherings with Friends and Family: Not on file  . Attends Religious Services: Not on file  . Active Member of Clubs or Organizations: Not on file  . Attends Banker Meetings: Not on file  . Marital Status: Not on file  Intimate Partner Violence:   . Fear of Current or Ex-Partner: Not on file  . Emotionally Abused: Not on file  . Physically Abused: Not on file  .  Sexually Abused: Not on file     Current Outpatient Medications:  .  albuterol (PROVENTIL HFA;VENTOLIN HFA) 108 (90 Base) MCG/ACT inhaler, Inhale 2 puffs into the lungs every 6 (six) hours as needed for wheezing or shortness of breath., Disp: 1 Inhaler, Rfl: 0 .  carbamazepine (TEGRETOL) 200 MG tablet, 1/2 tablet twice a day for 2 weeks, then take 1 tablet twice a day, Disp: 60 tablet, Rfl: 3 .  cyclobenzaprine (FEXMID) 7.5 MG tablet, Take 1 tablet (7.5 mg total) by mouth 3 (three) times daily as needed for muscle spasms., Disp: 30 tablet, Rfl: 0 .  cyclobenzaprine (FLEXERIL) 10 MG tablet, Take 1 tablet (10 mg total) by mouth 3 (three) times daily as needed for muscle spasms., Disp: 90 tablet, Rfl: 1 .  Fremanezumab-vfrm (AJOVY) 225 MG/1.5ML SOSY, Inject 225 mg into the muscle every 30 (thirty) days., Disp: 1.5 mL, Rfl: 5 .   lisinopril (ZESTRIL) 20 MG tablet, Take 1 tablet (20 mg total) by mouth daily., Disp: 30 tablet, Rfl: 5 .  zonisamide (ZONEGRAN) 100 MG capsule, One capsule in the morning and 2 at night, Disp: 270 capsule, Rfl: 3   Allergies  Allergen Reactions  . Amoxicillin Anaphylaxis  . Morphine And Related Anaphylaxis  . Penicillins Anaphylaxis    Has patient had a PCN reaction causing immediate rash, facial/tongue/throat swelling, SOB or lightheadedness with hypotension: Yes Has patient had a PCN reaction causing severe rash involving mucus membranes or skin necrosis: Yes Has patient had a PCN reaction that required hospitalization Yes Has patient had a PCN reaction occurring within the last 10 years: Yes If all of the above answers are "NO", then may proceed with Cephalosporin use.   . Rocephin [Ceftriaxone Sodium In Dextrose] Anaphylaxis  . Shellfish Allergy Anaphylaxis  . Tylenol [Acetaminophen] Anaphylaxis  . Topamax [Topiramate] Nausea And Vomiting  . Tramadol     ROS Review of Systems  Constitutional: Negative.   HENT: Negative.   Eyes: Negative.   Respiratory: Negative.   Cardiovascular: Negative.   Endocrine: Negative.   Musculoskeletal: Negative.   Psychiatric/Behavioral: Negative.      OBJECTIVE:    Physical Exam Constitutional:      Appearance: She is obese.  HENT:     Head: Normocephalic.     Mouth/Throat:     Mouth: Mucous membranes are moist.  Eyes:     Pupils: Pupils are equal, round, and reactive to light.  Cardiovascular:     Rate and Rhythm: Normal rate and regular rhythm.  Pulmonary:     Effort: Pulmonary effort is normal.  Musculoskeletal:     Cervical back: Normal range of motion.  Neurological:     General: No focal deficit present.     BP 138/88   Pulse 68   Ht 5\' 6"  (1.676 m)   Wt 215 lb 9.6 oz (97.8 kg)   BMI 34.80 kg/m  Wt Readings from Last 3 Encounters:  09/06/20 215 lb 9.6 oz (97.8 kg)  08/09/20 213 lb (96.6 kg)  07/10/20 215 lb (97.5  kg)    Health Maintenance Due  Topic Date Due  . Hepatitis C Screening  Never done  . COVID-19 Vaccine (1) Never done  . HIV Screening  Never done  . TETANUS/TDAP  Never done  . PAP SMEAR-Modifier  Never done  . INFLUENZA VACCINE  Never done    There are no preventive care reminders to display for this patient.  CBC Latest Ref Rng & Units 06/02/2020 07/09/2018 02/09/2017  WBC 4.0 - 10.5 K/uL 14.9(H) 17.0(H) 19.4(H)  Hemoglobin 12.0 - 15.0 g/dL 32.6 71.2 45.8  Hematocrit 36 - 46 % 41.5 39.9 44.0  Platelets 150 - 400 K/uL 366 320 314   CMP Latest Ref Rng & Units 06/02/2020 07/09/2018 02/09/2017  Glucose 70 - 99 mg/dL 099(I) 338(S) 94  BUN 6 - 20 mg/dL 9 12 9   Creatinine 0.44 - 1.00 mg/dL 5.05 3.97)  Sodium 135 - 145 mmol/L 142 143 140  Potassium 3.5 - 5.1 mmol/L 3.1(L) 3.1(L) 3.4(L)  Chloride 98 - 111 mmol/L 105 107 109  CO2 22 - 32 mmol/L 24 28 22   Calcium 8.9 - 10.3 mg/dL 9.2 9.5 9.8  Total Protein 6.5 - 8.1 g/dL - - -  Total Bilirubin 0.3 - 1.2 mg/dL - - -  Alkaline Phos 38 - 126 U/L - - -  AST 15 - 41 U/L - - -  ALT 14 - 54 U/L - - -    No results found for: TSH Lab Results  Component Value Date   ALBUMIN 4.0 10/04/2016   ANIONGAP 13 06/02/2020   No results found for: CHOL, HDL, LDLCALC, CHOLHDL No results found for: TRIG No results found for: HGBA1C    ASSESSMENT & PLAN:   Problem List Items Addressed This Visit      Cardiovascular and Mediastinum   Hypertensive heart disease without heart failure - Primary    HTN wnl today, no h/a or CP Plan continue current meds.          No orders of the defined types were placed in this encounter.     Follow-up: No follow-ups on file.    14/07/2016, FNP [facility] [address] [place], [address] [place]

## 2020-09-10 ENCOUNTER — Ambulatory Visit: Payer: Commercial Managed Care - PPO | Admitting: Internal Medicine

## 2020-12-06 ENCOUNTER — Other Ambulatory Visit: Payer: Self-pay

## 2020-12-06 ENCOUNTER — Ambulatory Visit (INDEPENDENT_AMBULATORY_CARE_PROVIDER_SITE_OTHER): Payer: Commercial Managed Care - PPO | Admitting: Family Medicine

## 2020-12-06 ENCOUNTER — Encounter: Payer: Self-pay | Admitting: Family Medicine

## 2020-12-06 VITALS — BP 152/90 | HR 92 | Ht 66.0 in | Wt 211.4 lb

## 2020-12-06 DIAGNOSIS — I10 Essential (primary) hypertension: Secondary | ICD-10-CM | POA: Diagnosis not present

## 2020-12-06 DIAGNOSIS — R7303 Prediabetes: Secondary | ICD-10-CM

## 2020-12-06 DIAGNOSIS — M542 Cervicalgia: Secondary | ICD-10-CM

## 2020-12-06 DIAGNOSIS — I119 Hypertensive heart disease without heart failure: Secondary | ICD-10-CM | POA: Diagnosis not present

## 2020-12-06 DIAGNOSIS — E669 Obesity, unspecified: Secondary | ICD-10-CM

## 2020-12-06 HISTORY — DX: Essential (primary) hypertension: I10

## 2020-12-06 LAB — POCT GLYCOSYLATED HEMOGLOBIN (HGB A1C): HbA1c POC (<> result, manual entry): 6.2 % (ref 4.0–5.6)

## 2020-12-06 MED ORDER — CYCLOBENZAPRINE HCL 7.5 MG PO TABS: 7.5000 mg | ORAL_TABLET | Freq: Three times a day (TID) | ORAL | 0 refills | Status: DC | PRN

## 2020-12-06 MED ORDER — LISINOPRIL-HYDROCHLOROTHIAZIDE 20-12.5 MG PO TABS: 1.0000 | ORAL_TABLET | Freq: Every day | ORAL | 3 refills | Status: DC

## 2020-12-06 NOTE — Progress Notes (Signed)
Established Patient Office Visit  SUBJECTIVE:  Subjective  Patient ID: [name], female    DOB: 10/06/1971  Age: 50 y.o. MRN: [id number]  CC:  Chief Complaint  Patient presents with  . Hypertension    Patient is here for her blood pressure 3 month follow up    HPI [name] is a 50 y.o. female presenting today for     Past Medical History:  Diagnosis Date  . Brain aneurysm   . Common migraine with intractable migraine 03/03/2017  . Headache   . Hemiplegic migraine 10/07/2015  . 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 Primary hypertension 12/06/2020  . Seizures (HCC)     Past Surgical History:  Procedure Laterality Date  . ABDOMINAL HYSTERECTOMY      Family History  Problem Relation Age of Onset  . Stroke Mother   . Hypertension Father   . Diabetes Father   . Migraines Sister   . Migraines Brother   . Migraines Sister   . Migraines Sister   . Migraines Sister   . Migraines Sister   . Migraines Sister   . Hypertension Paternal Aunt   . Diabetes Paternal Aunt   . Stroke Paternal Aunt   . Hypertension Paternal Uncle   . Diabetes Paternal Uncle   . Stroke Paternal Uncle   . Hypertension Maternal Grandmother   . Aneurysm Paternal Grandmother   . Diabetes Paternal Grandmother   . Hypertension Paternal Grandfather   . Cancer Paternal Grandfather   . Seizures Cousin     Social History   Socioeconomic History  . Marital status: Married    Spouse name: Not on file  . Number of children: 3  . Years of education: 63  . Highest education level: Not on file  Occupational History  . Occupation: Designer, television/film set  Tobacco Use  . Smoking status: Current Every Day Smoker    Packs/day: 0.50    Types: Cigarettes  . Smokeless tobacco: Never Used  Substance and Sexual Activity  . Alcohol use: Yes    Alcohol/week: 0.0 standard drinks    Comment: occasionally  . Drug use: No  . Sexual activity: Not on file  Other  Topics Concern  . Not on file  Social History Narrative   Patient drink about 2 cups of caffeine daily.   Patient is right handed.    Social Determinants of Health   Financial Resource Strain: Not on file  Food Insecurity: Not on file  Transportation Needs: Not on file  Physical Activity: Not on file  Stress: Not on file  Social Connections: Not on file  Intimate Partner Violence: Not on file     Current Outpatient Medications:  .  lisinopril-hydrochlorothiazide (ZESTORETIC) 20-12.5 MG tablet, Take 1 tablet by mouth daily., Disp: 90 tablet, Rfl: 3 .  albuterol (PROVENTIL HFA;VENTOLIN HFA) 108 (90 Base) MCG/ACT inhaler, Inhale 2 puffs into the lungs every 6 (six) hours as needed for wheezing or shortness of breath., Disp: 1 Inhaler, Rfl: 0 .  carbamazepine (TEGRETOL) 200 MG tablet, 1/2 tablet twice a day for 2 weeks, then take 1 tablet twice a day, Disp: 60 tablet, Rfl: 3 .  cyclobenzaprine (FEXMID) 7.5 MG tablet, Take 1 tablet (7.5 mg total) by mouth 3 (three) times daily as needed for muscle spasms., Disp: 30 tablet, Rfl: 0 .  cyclobenzaprine (FLEXERIL) 10 MG tablet, Take 1 tablet (10 mg total) by mouth 3 (three) times daily as needed  for muscle spasms., Disp: 90 tablet, Rfl: 1 .  Fremanezumab-vfrm (AJOVY) 225 MG/1.5ML SOSY, Inject 225 mg into the muscle every 30 (thirty) days., Disp: 1.5 mL, Rfl: 5 .  zonisamide (ZONEGRAN) 100 MG capsule, One capsule in the morning and 2 at night, Disp: 270 capsule, Rfl: 3   Allergies  Allergen Reactions  . Amoxicillin Anaphylaxis  . Morphine And Related Anaphylaxis  . Penicillins Anaphylaxis    Has patient had a PCN reaction causing immediate rash, facial/tongue/throat swelling, SOB or lightheadedness with hypotension: Yes Has patient had a PCN reaction causing severe rash involving mucus membranes or skin necrosis: Yes Has patient had a PCN reaction that required hospitalization Yes Has patient had a PCN reaction occurring within the last 10  years: Yes If all of the above answers are "NO", then may proceed with Cephalosporin use.   . Rocephin [Ceftriaxone Sodium In Dextrose] Anaphylaxis  . Shellfish Allergy Anaphylaxis  . Tylenol [Acetaminophen] Anaphylaxis  . Topamax [Topiramate] Nausea And Vomiting  . Tramadol     ROS Review of Systems  Constitutional: Negative.   HENT: Negative.   Eyes: Negative.   Respiratory: Negative.   Cardiovascular: Negative.   Musculoskeletal: Negative.   Hematological: Negative.      OBJECTIVE:    Physical Exam Vitals and nursing note reviewed.  Constitutional:      Appearance: She is obese.  HENT:     Head: Normocephalic.     Mouth/Throat:     Mouth: Mucous membranes are moist.  Cardiovascular:     Rate and Rhythm: Normal rate and regular rhythm.  Pulmonary:     Effort: Pulmonary effort is normal.  Musculoskeletal:     Cervical back: Normal range of motion.  Skin:    General: Skin is warm.  Neurological:     Mental Status: She is alert.  Psychiatric:        Mood and Affect: Mood normal.     BP (!) 152/90   Pulse 92   Ht 5\' 6"  (1.676 m)   Wt 211 lb 6.4 oz (95.9 kg)   BMI 34.12 kg/m  Wt Readings from Last 3 Encounters:  12/06/20 211 lb 6.4 oz (95.9 kg)  09/06/20 215 lb 9.6 oz (97.8 kg)  08/09/20 213 lb (96.6 kg)    Health Maintenance Due  Topic Date Due  . Hepatitis C Screening  Never done  . COVID-19 Vaccine (1) Never done  . HIV Screening  Never done  . TETANUS/TDAP  Never done  . PAP SMEAR-Modifier  Never done  . COLONOSCOPY (Pts 45-24yrs Insurance coverage will need to be confirmed)  Never done  . INFLUENZA VACCINE  Never done    There are no preventive care reminders to display for this patient.  CBC Latest Ref Rng & Units 06/02/2020 07/09/2018 02/09/2017  WBC 4.0 - 10.5 K/uL 14.9(H) 17.0(H) 19.4(H)  Hemoglobin 12.0 - 15.0 g/dL 02/11/2017 85.0 27.7  Hematocrit 36.0 - 46.0 % 41.5 39.9 44.0  Platelets 150 - 400 K/uL 366 320 314   CMP Latest Ref Rng & Units  06/02/2020 07/09/2018 02/09/2017  Glucose 70 - 99 mg/dL 02/11/2017) 878(M) 94  BUN 6 - 20 mg/dL 9 12 9   Creatinine 0.44 - 1.00 mg/dL 767(M 0.94)  Sodium 135 - 145 mmol/L 142 143 140  Potassium 3.5 - 5.1 mmol/L 3.1(L) 3.1(L) 3.4(L)  Chloride 98 - 111 mmol/L 105 107 109  CO2 22 - 32 mmol/L 24 28 22   Calcium 8.9 - 10.3 mg/dL 9.2 9.5  9.8  Total Protein 6.5 - 8.1 g/dL - - -  Total Bilirubin 0.3 - 1.2 mg/dL - - -  Alkaline Phos 38 - 126 U/L - - -  AST 15 - 41 U/L - - -  ALT 14 - 54 U/L - - -    No results found for: TSH Lab Results  Component Value Date   ALBUMIN 4.0 10/04/2016   ANIONGAP 13 06/02/2020   No results found for: CHOL, HDL, LDLCALC, CHOLHDL No results found for: TRIG Lab Results  Component Value Date   HGBA1C 6.2 12/06/2020      ASSESSMENT & PLAN:   Problem List Items Addressed This Visit      Cardiovascular and Mediastinum   Hypertensive heart disease without heart failure    HTN not at goal, adding HCTZ to Lisinopril fu 1 month      Relevant Medications   lisinopril-hydrochlorothiazide (ZESTORETIC) 20-12.5 MG tablet   RESOLVED: Primary hypertension   Relevant Medications   lisinopril-hydrochlorothiazide (ZESTORETIC) 20-12.5 MG tablet     Other   Prediabetes - Primary    A1C today 6.2, she has had multiple glucose readings above 100. Plan- Diet and exercise, she is going to cut out regular sodas and increase water intake.       Relevant Orders   POCT HgB A1C (Completed)   Obesity (BMI 30.0-34.9)    Weight loss goal 5 lbs in 1 month, will increase exercise.        Other Visit Diagnoses    Cervicalgia       Relevant Orders   DG Cervical Spine Complete      Meds ordered this encounter  Medications  . lisinopril-hydrochlorothiazide (ZESTORETIC) 20-12.5 MG tablet    Sig: Take 1 tablet by mouth daily.    Dispense:  90 tablet    Refill:  3  . cyclobenzaprine (FEXMID) 7.5 MG tablet    Sig: Take 1 tablet (7.5 mg total) by mouth 3 (three) times daily  as needed for muscle spasms.    Dispense:  30 tablet    Refill:  0      Follow-up: No follow-ups on file.    Irish Lack, FNP [facility] [address] [place], [place] [address]

## 2020-12-06 NOTE — Assessment & Plan Note (Signed)
HTN not at goal, adding HCTZ to Lisinopril fu 1 month

## 2020-12-06 NOTE — Assessment & Plan Note (Signed)
A1C today 6.2, she has had multiple glucose readings above 100. Plan- Diet and exercise, she is going to cut out regular sodas and increase water intake.

## 2020-12-06 NOTE — Assessment & Plan Note (Signed)
Weight loss goal 5 lbs in 1 month, will increase exercise.

## 2020-12-11 ENCOUNTER — Ambulatory Visit: Payer: Commercial Managed Care - PPO | Admitting: Neurology

## 2021-01-03 ENCOUNTER — Ambulatory Visit: Payer: Commercial Managed Care - PPO | Admitting: Family Medicine

## 2021-01-03 ENCOUNTER — Other Ambulatory Visit: Payer: Self-pay

## 2021-01-19 ENCOUNTER — Emergency Department (HOSPITAL_COMMUNITY): Payer: Commercial Managed Care - PPO

## 2021-01-19 ENCOUNTER — Emergency Department (HOSPITAL_COMMUNITY)
Admission: EM | Admit: 2021-01-19 | Discharge: 2021-01-19 | Disposition: A | Discharge status: Home / Self Care | Payer: Commercial Managed Care - PPO | Attending: Emergency Medicine | Admitting: Emergency Medicine

## 2021-01-19 ENCOUNTER — Other Ambulatory Visit: Payer: Self-pay

## 2021-01-19 DIAGNOSIS — G43409 Hemiplegic migraine, not intractable, without status migrainosus: Secondary | ICD-10-CM | POA: Insufficient documentation

## 2021-01-19 DIAGNOSIS — R531 Weakness: Secondary | ICD-10-CM | POA: Diagnosis present

## 2021-01-19 DIAGNOSIS — F17210 Nicotine dependence, cigarettes, uncomplicated: Secondary | ICD-10-CM | POA: Insufficient documentation

## 2021-01-19 DIAGNOSIS — G43109 Migraine with aura, not intractable, without status migrainosus: Secondary | ICD-10-CM | POA: Diagnosis not present

## 2021-01-19 DIAGNOSIS — I119 Hypertensive heart disease without heart failure: Secondary | ICD-10-CM | POA: Insufficient documentation

## 2021-01-19 LAB — CBC
HCT: 42.4 % (ref 36.0–46.0)
Hemoglobin: 13.4 g/dL (ref 12.0–15.0)
MCH: 26.3 pg (ref 26.0–34.0)
MCHC: 31.6 g/dL (ref 30.0–36.0)
MCV: 83.1 fL (ref 80.0–100.0)
Platelets: 341 10*3/uL (ref 150–400)
RBC: 5.1 MIL/uL (ref 3.87–5.11)
RDW: 14.3 % (ref 11.5–15.5)
WBC: 15.2 10*3/uL — ABNORMAL HIGH (ref 4.0–10.5)
nRBC: 0 % (ref 0.0–0.2)

## 2021-01-19 LAB — COMPREHENSIVE METABOLIC PANEL
ALT: 33 U/L (ref 0–44)
AST: 29 U/L (ref 15–41)
Albumin: 3.7 g/dL (ref 3.5–5.0)
Alkaline Phosphatase: 77 U/L (ref 38–126)
Anion gap: 8 (ref 5–15)
BUN: 6 mg/dL (ref 6–20)
CO2: 26 mmol/L (ref 22–32)
Calcium: 9.6 mg/dL (ref 8.9–10.3)
Chloride: 107 mmol/L (ref 98–111)
Creatinine, Ser: 0.64 mg/dL (ref 0.44–1.00)
GFR, Estimated: >60 mL/min (ref >60)
Glucose, Bld: 112 mg/dL — ABNORMAL HIGH (ref 70–99)
Potassium: 3.2 mmol/L — ABNORMAL LOW (ref 3.5–5.1)
Sodium: 141 mmol/L (ref 135–145)
Total Bilirubin: 0.3 mg/dL (ref 0.3–1.2)
Total Protein: 6.9 g/dL (ref 6.5–8.1)

## 2021-01-19 LAB — I-STAT CHEM 8, ED
BUN: 8 mg/dL (ref 6–20)
Calcium, Ion: 1.07 mmol/L — ABNORMAL LOW (ref 1.15–1.40)
Chloride: 108 mmol/L (ref 98–111)
Creatinine, Ser: 0.5 mg/dL (ref 0.44–1.00)
Glucose, Bld: 111 mg/dL — ABNORMAL HIGH (ref 70–99)
HCT: 45 % (ref 36.0–46.0)
Hemoglobin: 15.3 g/dL — ABNORMAL HIGH (ref 12.0–15.0)
Potassium: 7.9 mmol/L (ref 3.5–5.1)
Sodium: 139 mmol/L (ref 135–145)
TCO2: 25 mmol/L (ref 22–32)

## 2021-01-19 LAB — CBG MONITORING, ED: Glucose-Capillary: 114 mg/dL — ABNORMAL HIGH (ref 70–99)

## 2021-01-19 LAB — DIFFERENTIAL
Abs Immature Granulocytes: 0.06 10*3/uL (ref 0.00–0.07)
Basophils Absolute: 0.1 10*3/uL (ref 0.0–0.1)
Basophils Relative: 0 %
Eosinophils Absolute: 0.4 10*3/uL (ref 0.0–0.5)
Eosinophils Relative: 2 %
Immature Granulocytes: 0 %
Lymphocytes Relative: 27 %
Lymphs Abs: 4 10*3/uL (ref 0.7–4.0)
Monocytes Absolute: 0.9 10*3/uL (ref 0.1–1.0)
Monocytes Relative: 6 %
Neutro Abs: 9.8 10*3/uL — ABNORMAL HIGH (ref 1.7–7.7)
Neutrophils Relative %: 65 %

## 2021-01-19 LAB — PROTIME-INR
INR: 1 (ref 0.8–1.2)
Prothrombin Time: 13.1 seconds (ref 11.4–15.2)

## 2021-01-19 LAB — I-STAT BETA HCG BLOOD, ED (MC, WL, AP ONLY): I-stat hCG, quantitative: <5 m[IU]/mL (ref <5)

## 2021-01-19 LAB — APTT: aPTT: 24 seconds (ref 24–36)

## 2021-01-19 MED ORDER — SODIUM CHLORIDE 0.9 % IV SOLN
12.5000 mg | Freq: Once | INTRAVENOUS | Status: DC
  Filled 2021-01-19: qty 0.5

## 2021-01-19 MED ORDER — ONDANSETRON HCL 4 MG/2ML IJ SOLN
INTRAMUSCULAR | Status: AC
  Administered 2021-01-19: 4 mg via INTRAVENOUS
  Filled 2021-01-19: qty 2

## 2021-01-19 MED ORDER — METOCLOPRAMIDE HCL 5 MG/ML IJ SOLN
10.0000 mg | Freq: Once | INTRAMUSCULAR | Status: AC
  Administered 2021-01-19: 10 mg via INTRAVENOUS
  Filled 2021-01-19: qty 2

## 2021-01-19 MED ORDER — KETOROLAC TROMETHAMINE 30 MG/ML IJ SOLN
30.0000 mg | Freq: Once | INTRAMUSCULAR | Status: AC
  Administered 2021-01-19: 30 mg via INTRAVENOUS
  Filled 2021-01-19: qty 1

## 2021-01-19 MED ORDER — SODIUM CHLORIDE 0.9% FLUSH
3.0000 mL | Freq: Once | INTRAVENOUS | Status: AC
Start: 2021-01-19 — End: 2021-01-19
  Administered 2021-01-19: 3 mL via INTRAVENOUS

## 2021-01-19 MED ORDER — SODIUM CHLORIDE 0.9 % IV BOLUS
1000.0000 mL | Freq: Once | INTRAVENOUS | Status: AC
  Administered 2021-01-19: 1000 mL via INTRAVENOUS

## 2021-01-19 MED ORDER — PROCHLORPERAZINE EDISYLATE 10 MG/2ML IJ SOLN
10.0000 mg | Freq: Once | INTRAMUSCULAR | Status: AC
  Administered 2021-01-19: 10 mg via INTRAVENOUS
  Filled 2021-01-19: qty 2

## 2021-01-19 MED ORDER — ONDANSETRON HCL 4 MG/2ML IJ SOLN: 4.0000 mg | Freq: Once | INTRAMUSCULAR | Status: AC

## 2021-01-19 NOTE — Code Documentation (Signed)
Responded to Code Stroke called at 0300 for L sided weakness and slurred speech, [id number]. Pt arrived at 0310, NIH-4, CBG-114, CT head negative, MRI-negative. Plan to treat for complex migraine.

## 2021-01-19 NOTE — ED Triage Notes (Addendum)
Pt brought to ED via EMS from home with c/o headache, vomiting, neck pain. EMS reports pt developed left sided weakness and facial grimace en route. LKW 01:30. Pt A&O x4 on arrival, VSS. Transported to CT 1.  EMS v/s: 176/86 CBG 116

## 2021-01-19 NOTE — Consult Note (Signed)
Neurology Consultation  Reason for Consult: Code stroke, left-sided weakness Referring Physician: Dr. Cathi Roan  CC: Left-sided weakness, left-sided face pain and headache, slurred speech  History is obtained from: Patient, chart  HPI: [name] is a 50 y.o. female past medical history of ICH in 2009 affecting the right hemisphere with resultant left hemiparesis that has nearly completely resolved with mild left-sided increased tone that is residual, hemiplegic migraines, difficult to manage and intractable migraines, presenting to the emergency room for evaluation of strokelike symptoms. Last known well 1:30 AM.  Shortly after that, was noticed that she was having severe pain in her left head left face arm and leg along with face arm and leg weakness on the left side as well as slurred speech.  EMS was called.  They evaluated her.  She was LVO positive on what ever scale [facility] uses and brought her to [facility] for further evaluation. In the emergency room, patient was able to provide reliable history. Examination was performed with an NIH of 4 at the ER bridge. Noncontrast head CT was performed which was negative for any acute stroke, bleed with an aspects 10. Not offered TPA due to inconsistent exam as well as prior history of ICH and history of hemiplegic migraines and hence taken for stat MRI to further evaluate prior to making tPA decision.  Also noted on the way the patient had nausea and bringing up a lot of phlegm with some blood-tinged sputum.  Reports minor chest discomfort.  No shortness of breath.  Reports being very anxious at this time because of what ever is going on.   LKW: 1:30 AM on 01/19/2021 tpa given?: no, initially inconsistent exam and low NIH, confirmed with negative MRI for stroke Premorbid modified Rankin scale (mRS): 1  ROS: Performed and negative except as noted in HPI.  Past Medical History:  Diagnosis Date  . Brain aneurysm   . Common  migraine with intractable migraine 03/03/2017  . Headache   . Hemiplegic migraine 10/07/2015  . 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 Primary hypertension 12/06/2020  . Seizures (HCC)     Family History  Problem Relation Age of Onset  . Stroke Mother   . Hypertension Father   . Diabetes Father   . Migraines Sister   . Migraines Brother   . Migraines Sister   . Migraines Sister   . Migraines Sister   . Migraines Sister   . Migraines Sister   . Hypertension Paternal Aunt   . Diabetes Paternal Aunt   . Stroke Paternal Aunt   . Hypertension Paternal Uncle   . Diabetes Paternal Uncle   . Stroke Paternal Uncle   . Hypertension Maternal Grandmother   . Aneurysm Paternal Grandmother   . Diabetes Paternal Grandmother   . Hypertension Paternal Grandfather   . Cancer Paternal Grandfather   . Seizures Cousin      Social History:   reports that she has been smoking cigarettes. She has been smoking about 0.50 packs per day. She has never used smokeless tobacco. She reports current alcohol use. She reports that she does not use drugs.  Medications  Current Facility-Administered Medications:  .  sodium chloride flush (NS) 0.9 % injection 3 mL, 3 mL, Intravenous, Once, Long, Arlyss Repress, MD  Current Outpatient Medications:  .  albuterol (PROVENTIL HFA;VENTOLIN HFA) 108 (90 Base) MCG/ACT inhaler, Inhale 2 puffs into the lungs every 6 (six) hours as needed  for wheezing or shortness of breath., Disp: 1 Inhaler, Rfl: 0 .  carbamazepine (TEGRETOL) 200 MG tablet, 1/2 tablet twice a day for 2 weeks, then take 1 tablet twice a day, Disp: 60 tablet, Rfl: 3 .  cyclobenzaprine (FEXMID) 7.5 MG tablet, Take 1 tablet (7.5 mg total) by mouth 3 (three) times daily as needed for muscle spasms., Disp: 30 tablet, Rfl: 0 .  cyclobenzaprine (FLEXERIL) 10 MG tablet, Take 1 tablet (10 mg total) by mouth 3 (three) times daily as needed for muscle spasms., Disp: 90 tablet, Rfl:  1 .  Fremanezumab-vfrm (AJOVY) 225 MG/1.5ML SOSY, Inject 225 mg into the muscle every 30 (thirty) days., Disp: 1.5 mL, Rfl: 5 .  lisinopril-hydrochlorothiazide (ZESTORETIC) 20-12.5 MG tablet, Take 1 tablet by mouth daily., Disp: 90 tablet, Rfl: 3 .  zonisamide (ZONEGRAN) 100 MG capsule, One capsule in the morning and 2 at night, Disp: 270 capsule, Rfl: 3   Exam: Current vital signs: There were no vitals taken for this visit. Vital signs in last 24 hours: BP: ()/()  Arterial Line BP: ()/()   Blood pressure 119/60 Heart rate 90 Saturating at 99% General: Awake alert and very tearful HEENT: Normocephalic, atraumatic Lungs: Clear Cardiovascular: Regular rate rhythm Abdomen soft obese, nontender Extremities warm well perfused with no edema Neurological exam Awake alert oriented x3 Speech is moderately dysarthric No aphasia Cranial: Pupils equal round react light, extraocular movement intact, visual field full, upon asking her to smile, it was noted that she appeared to be volitionally brought the left angle of the mouth down with normal movement of the right side-appearing somewhat inconsistent and nonorganic, auditory acuity intact, tongue and palate midline. Motor exam: Initially she had extreme difficulty and elevating her left arm above the bed but with distraction was able to lift the left upper extremity antigravity with barely noticeable vertical drift.  Initially the tone also was increased in the left upper extremity and while examining the other side and keeping her distracted and examining the tone on the left side, the tone also normalized. Sensory exam: Tender to touch on the left without any loss of sensation throughout the left hemibody.  Coordination: No dysmetria on the right, difficult to perform on the left Initial NIH stroke score-4   Labs I have reviewed labs in epic and the results pertinent to this consultation are:   CBC    Component Value Date/Time   WBC  14.9 (H) 06/02/2020 1444   RBC 5.07 06/02/2020 1444   HGB 13.2 06/02/2020 1444   HGB 12.7 08/29/2014 1556   HCT 41.5 06/02/2020 1444   HCT 40.2 08/29/2014 1556   PLT 366 06/02/2020 1444   PLT 396 08/29/2014 1556   MCV 81.9 06/02/2020 1444   MCV 83 08/29/2014 1556   MCH 26.0 06/02/2020 1444   MCHC 31.8 06/02/2020 1444   RDW 14.8 06/02/2020 1444   RDW 13.9 08/29/2014 1556   LYMPHSABS 3.2 09/02/2016 1110   LYMPHSABS 3.6 12/20/2011 0547   MONOABS 1.0 (H) 09/02/2016 1110   MONOABS 1.0 (H) 12/20/2011 0547   EOSABS 0.2 09/02/2016 1110   EOSABS 0.2 12/20/2011 0547   BASOSABS 0.1 09/02/2016 1110   BASOSABS 0.0 12/20/2011 0547    CMP     Component Value Date/Time   NA 142 06/02/2020 1444   NA 145 08/29/2014 1556   K 3.1 (L) 06/02/2020 1444   K 4.0 08/29/2014 1556   CL 105 06/02/2020 1444   CL 113 (H) 08/29/2014 1556   CO2  24 06/02/2020 1444   CO2 24 08/29/2014 1556   GLUCOSE 155 (H) 06/02/2020 1444   GLUCOSE 87 08/29/2014 1556   BUN 9 06/02/2020 1444   BUN 9 08/29/2014 1556   CREATININE 0.73 06/02/2020 1444   CREATININE 0.74 08/29/2014 1556   CALCIUM 9.2 06/02/2020 1444   CALCIUM 8.5 08/29/2014 1556   PROT 7.8 10/04/2016 1709   PROT 7.9 02/01/2014 1605   ALBUMIN 4.0 10/04/2016 1709   ALBUMIN 4.0 02/01/2014 1605   AST 23 10/04/2016 1709   AST 20 02/01/2014 1605   ALT 18 10/04/2016 1709   ALT 30 02/01/2014 1605   ALKPHOS 61 10/04/2016 1709   ALKPHOS 74 02/01/2014 1605   BILITOT 0.5 10/04/2016 1709   BILITOT 0.4 02/01/2014 1605   GFRNONAA >60 06/02/2020 1444   GFRNONAA >60 08/29/2014 1556   GFRNONAA >60 02/01/2014 1605   GFRAA >60 06/02/2020 1444   GFRAA >60 08/29/2014 1556   GFRAA >60 02/01/2014 1605    Lipid Panel  No results found for: CHOL, TRIG, HDL, CHOLHDL, VLDL, LDLCALC, LDLDIRECT   Imaging I have reviewed the images obtained:  CT-scan of the brain-no acute changes  MRI examination of the brain-no acute changes.  Prior sequela of right parietal  hemorrhagic stroke visible on the susceptibility imaging.  No acute stroke.  No acute bleed  Assessment: 50 year old with a known history of ICH in the right parietal lobe in 2009 with ensuing migraines and headaches some of them at times being intractable, hemiplegic migraines, presented to the emergency room for sudden onset of left-sided face arm and neck pain followed by weakness. Exam somewhat inconsistent with a stroke due to lack of sensory loss and more of palpable tenderness on the left face arm and leg and facial weakness also somewhat inconsistent with a true upper motor neuron facial weakness. tPA not given due to concern for this not being stroke on clinical exam as well as history of hemiplegic migraines and low NIH. Given her prior history of ICH, stat MRI was performed to rule out any ischemic stroke-which was negative for any acute process. Positively not a tPA candidate given no stroke on MRI while symptoms have been present for upwards of three hours.  Impression: Likely complex migraine  Recommendations: Migraine cocktail IV fluids Recommended smoking cessation Follow-up with outpatient neurology Management of the hemoptysis per ER. No further inpatient neurological work-up at this time Conintue home antiepileptic regimen as advised by her neurologist. Neurology will be available as needed. Discussed my plan and recommendations with Dr. Jacqulyn Bath in the emergency room.    -- Milon Dikes, MD Neurologist [facility] Pager: [phone]

## 2021-01-19 NOTE — ED Provider Notes (Signed)
Emergency Department Provider Note   I have reviewed the triage vital signs and the nursing notes.   HISTORY  Chief Complaint No chief complaint on file.   HPI [name] is a 50 y.o. female with PMH reviewed below presents to the ED by EMS as a CODE stroke. She was last well at 1:30 AM. She arrives with left side weakness and facial asymmetry. Notes some associated HA and neck pain with vomiting. Has prior history of hemorrhagic CVA but this was many years prior. No radiation of symptoms or modifying factors.    Past Medical History:  Diagnosis Date  . Brain aneurysm   . Common migraine with intractable migraine 03/03/2017  . Headache   . Hemiplegic migraine 10/07/2015  . 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 Primary hypertension 12/06/2020  . Seizures [facility])     Patient Active Problem List   Diagnosis Date Noted  . Prediabetes 12/06/2020  . Obesity (BMI 30.0-34.9) 12/06/2020  . Chronic pain 08/09/2020  . Headache   . Hx of cerebral infarction 06/24/2020  . 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 Hypertensive heart disease without heart failure 06/05/2020  . Common migraine with intractable migraine 03/03/2017  . Seizures (HCC) 10/07/2015  . Hemiplegic migraine 10/07/2015    Past Surgical History:  Procedure Laterality Date  . ABDOMINAL HYSTERECTOMY      Allergies Amoxicillin, Morphine and related, Penicillins, Rocephin [ceftriaxone sodium in dextrose], Shellfish allergy, Tylenol [acetaminophen], Topamax [topiramate], and Tramadol  Family History  Problem Relation Age of Onset  . Stroke Mother   . Hypertension Father   . Diabetes Father   . Migraines Sister   . Migraines Brother   . Migraines Sister   . Migraines Sister   . Migraines Sister   . Migraines Sister   . Migraines Sister   . Hypertension Paternal Aunt   . Diabetes Paternal Aunt   . Stroke Paternal Aunt   .  Hypertension Paternal Uncle   . Diabetes Paternal Uncle   . Stroke Paternal Uncle   . Hypertension Maternal Grandmother   . Aneurysm Paternal Grandmother   . Diabetes Paternal Grandmother   . Hypertension Paternal Grandfather   . Cancer Paternal Grandfather   . Seizures Cousin     Social History Social History   Tobacco Use  . Smoking status: Current Every Day Smoker    Packs/day: 0.50    Types: Cigarettes  . Smokeless tobacco: Never Used  Substance Use Topics  . Alcohol use: Yes    Alcohol/week: 0.0 standard drinks    Comment: occasionally  . Drug use: No    Review of Systems  Constitutional: No fever/chills Eyes: No visual changes. ENT: No sore throat. Cardiovascular: Denies chest pain. Respiratory: Denies shortness of breath. Gastrointestinal: No abdominal pain.  Positive nausea/vomiting.  No diarrhea.  No constipation. Genitourinary: Negative for dysuria. Musculoskeletal: Negative for back pain. Skin: Negative for rash. Neurological: Negative for focal numbness. Positive HA. Positive left sided weakness.   10-point ROS otherwise negative.  ____________________________________________   PHYSICAL EXAM:  VITAL SIGNS: Temp: 97.6 F Pulse: 81 Resp: 16  Constitutional: Alert and oriented. Well appearing and in no acute distress. Eyes: Conjunctivae are normal.  Head: Atraumatic. Nose: No congestion/rhinnorhea. Mouth/Throat: Mucous membranes are moist.   Neck: No stridor.  Cardiovascular: Normal rate, regular rhythm. Good peripheral circulation. Grossly normal heart sounds.   Respiratory: Normal respiratory effort.  No retractions. Lungs CTAB.  Gastrointestinal: Soft and nontender. No distention.  Musculoskeletal: No lower extremity tenderness nor edema. No gross deformities of extremities. Neurologic:  Normal speech and language. Left face droop and left arm/leg weakness. Skin:  Skin is warm, dry and intact. No rash  noted.   ____________________________________________  LABS (all labs ordered are listed, but only abnormal results are displayed)  Labs Reviewed  CBC - Abnormal; Notable for the following components:      Result Value   WBC 15.2 (*)    All other components within normal limits  DIFFERENTIAL - Abnormal; Notable for the following components:   Neutro Abs 9.8 (*)    All other components within normal limits  COMPREHENSIVE METABOLIC PANEL - Abnormal; Notable for the following components:   Potassium 3.2 (*)    Glucose, Bld 112 (*)    All other components within normal limits  I-STAT CHEM 8, ED - Abnormal; Notable for the following components:   Potassium 7.9 (*)    Glucose, Bld 111 (*)    Calcium, Ion 1.07 (*)    Hemoglobin 15.3 (*)    All other components within normal limits  CBG MONITORING, ED - Abnormal; Notable for the following components:   Glucose-Capillary 114 (*)    All other components within normal limits  PROTIME-INR  APTT  I-STAT BETA HCG BLOOD, ED ([facility], [facility], [facility] ONLY)   ____________________________________________  EKG   EKG Interpretation  Date/Time:  Sunday January 19 2021 04:14:19 EDT Ventricular Rate:  83 PR Interval:    QRS Duration: 104 QT Interval:  392 QTC Calculation: 461 R Axis:   18 Text Interpretation: Normal sinus rhythm Confirmed by Alona Bene [phone]) on 01/19/2021 4:16:05 AM Also confirmed by Alona Bene [phone]), editor Elita Quick ([id number])  on 01/20/2021 8:18:39 AM       ____________________________________________  RADIOLOGY  CT head and MRI brain negative for bleed or CVA.  ____________________________________________   PROCEDURES  Procedure(s) performed:   Procedures  None ____________________________________________   INITIAL IMPRESSION / ASSESSMENT AND PLAN / ED COURSE  Pertinent labs & imaging results that were available during my care of the patient were reviewed by me and considered in my medical decision  making (see chart for details).   Patient presents to the ED with CVA symptoms and HA. Arrives as a CODE STROKE. Evaluated initially by me and neurology team. No tPA. MRI and CT head along with labs negative for stroke. Exam in this setting is most consistent with complex migraine.   HA treated and neuro symptoms have improved. Plan for discharge with outpatient neuro follow up.    ____________________________________________  FINAL CLINICAL IMPRESSION(S) / ED DIAGNOSES  Final diagnoses:  Hemiplegic migraine without status migrainosus, not intractable     MEDICATIONS GIVEN DURING THIS VISIT:  Medications  sodium chloride flush (NS) 0.9 % injection 3 mL (3 mLs Intravenous Given 01/19/21 0349)  ondansetron (ZOFRAN) injection 4 mg (4 mg Intravenous Given 01/19/21 0330)  ketorolac (TORADOL) 30 MG/ML injection 30 mg (30 mg Intravenous Given 01/19/21 0427)  prochlorperazine (COMPAZINE) injection 10 mg (10 mg Intravenous Given 01/19/21 0427)  sodium chloride 0.9 % bolus 1,000 mL (0 mLs Intravenous Stopped 01/19/21 0548)  metoCLOPramide (REGLAN) injection 10 mg (10 mg Intravenous Given 01/19/21 0522)     Note:  This document was prepared using Dragon voice recognition software and may include unintentional dictation errors.  Alona Bene, MD, [facility] Emergency [facility]    Zayveon Raschke, Arlyss Repress, MD 01/21/21 1030

## 2021-01-19 NOTE — Discharge Instructions (Signed)
Please follow closely with your PCP and Neurologist in the coming week. Return to the ED with any new or suddenly worsening symptoms.

## 2021-01-20 ENCOUNTER — Telehealth: Payer: Self-pay | Admitting: *Deleted

## 2021-01-20 NOTE — Telephone Encounter (Signed)
Transition Care Management Unsuccessful Follow-up Telephone Call  Date of discharge and from where:  01/19/2021 [facility]  Attempts:  1st Attempt  Reason for unsuccessful TCM follow-up call:  No answer/busy

## 2021-01-21 NOTE — Telephone Encounter (Signed)
Transition Care Management Unsuccessful Follow-up Telephone Call  Date of discharge and from where:  [name]  Attempts:  2nd Attempt  Reason for unsuccessful TCM follow-up call:  No answer/busy

## 2021-01-22 NOTE — Telephone Encounter (Signed)
Transition Care Management Follow-up Telephone Call  Date of discharge and from where: 01/19/2021 from [facility]  How have you been since you were released from the hospital? Pt stated that she is doing about the same the left side is still weak.   Any questions or concerns? No  Items Reviewed:  Did the pt receive and understand the discharge instructions provided? Yes   Medications obtained and verified? Yes   Other? No   Any new allergies since your discharge? No   Dietary orders reviewed? n/a  Do you have support at home? Yes   Functional Questionnaire: (I = Independent and D = Dependent) ADLs: I  Bathing/Dressing- I  Meal Prep- I  Eating- I  Maintaining continence- I  Transferring/Ambulation- I  Managing Meds- I   Follow up appointments reviewed:   PCP Hospital f/u appt confirmed? No  Pt encouraged to call PCP for an appointment.   Specialist Hospital f/u appt confirmed? Yes  Scheduled to see Margie Ege, NP on 02/27/2021 @ 10:45am.  Are transportation arrangements needed? No   If their condition worsens, is the pt aware to call PCP or go to the Emergency Dept.? Yes  Was the patient provided with contact information for the PCP's office or ED? Yes  Was to pt encouraged to call back with questions or concerns? Yes

## 2021-01-27 ENCOUNTER — Encounter: Payer: Self-pay | Admitting: Internal Medicine

## 2021-01-27 ENCOUNTER — Other Ambulatory Visit: Payer: Self-pay

## 2021-01-27 ENCOUNTER — Ambulatory Visit: Payer: Commercial Managed Care - PPO | Admitting: Internal Medicine

## 2021-01-27 VITALS — BP 140/87 | HR 90 | Ht 61.5 in | Wt 208.3 lb

## 2021-01-27 DIAGNOSIS — I119 Hypertensive heart disease without heart failure: Secondary | ICD-10-CM | POA: Diagnosis not present

## 2021-01-27 DIAGNOSIS — E669 Obesity, unspecified: Secondary | ICD-10-CM

## 2021-01-27 DIAGNOSIS — G894 Chronic pain syndrome: Secondary | ICD-10-CM

## 2021-01-27 DIAGNOSIS — I69359 Hemiplegia and hemiparesis following cerebral infarction affecting unspecified side: Secondary | ICD-10-CM

## 2021-01-27 DIAGNOSIS — R7303 Prediabetes: Secondary | ICD-10-CM

## 2021-01-27 DIAGNOSIS — R569 Unspecified convulsions: Secondary | ICD-10-CM

## 2021-01-27 DIAGNOSIS — G43019 Migraine without aura, intractable, without status migrainosus: Secondary | ICD-10-CM | POA: Diagnosis not present

## 2021-01-27 DIAGNOSIS — G4489 Other headache syndrome: Secondary | ICD-10-CM

## 2021-01-27 NOTE — Assessment & Plan Note (Signed)
Patient is referred to neurologist.  And back specialist

## 2021-01-27 NOTE — Assessment & Plan Note (Signed)
Chronic problem. 

## 2021-01-27 NOTE — Assessment & Plan Note (Signed)
Patient will be referred to neurologist ?

## 2021-01-27 NOTE — Assessment & Plan Note (Signed)

## 2021-01-27 NOTE — Assessment & Plan Note (Signed)

## 2021-01-27 NOTE — Progress Notes (Signed)
Established Patient Office Visit  Subjective:  Patient ID: [name], female    DOB: 01-Dec-1970  Age: 50 y.o. MRN: [id number]  CC:  Chief Complaint  Patient presents with  . Hospitalization Follow-up    HPI  [name] presents forER  Follow up, patient is 50 years old female she was seen in the emergency room [place] facial weakness and left-sided weakness.  She had a history of stroke in the past which was hemorrhagic.  She has been worked up in the emergency room and was referred to the neurologist.  She complains of pain on the left side with weakness.  There is a history of stroke stroke many years ago.  Patient past history include history of intractable migraine brain aneurysm hemiplegic migraine  .  And a seizure disorder.  Patient borderline overweight    Past Medical History:  Diagnosis Date  . Brain aneurysm   . Common migraine with intractable migraine 03/03/2017  . Headache   . Hemiplegic migraine 10/07/2015  . 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 Primary hypertension 12/06/2020  . Seizures (HCC)     Past Surgical History:  Procedure Laterality Date  . ABDOMINAL HYSTERECTOMY      Family History  Problem Relation Age of Onset  . Stroke Mother   . Hypertension Father   . Diabetes Father   . Migraines Sister   . Migraines Brother   . Migraines Sister   . Migraines Sister   . Migraines Sister   . Migraines Sister   . Migraines Sister   . Hypertension Paternal Aunt   . Diabetes Paternal Aunt   . Stroke Paternal Aunt   . Hypertension Paternal Uncle   . Diabetes Paternal Uncle   . Stroke Paternal Uncle   . Hypertension Maternal Grandmother   . Aneurysm Paternal Grandmother   . Diabetes Paternal Grandmother   . Hypertension Paternal Grandfather   . Cancer Paternal Grandfather   . Seizures Cousin     Social History   Socioeconomic History  . Marital status: Married    Spouse name: Not on file  . Number  of children: 3  . Years of education: 42  . Highest education level: Not on file  Occupational History  . Occupation: Designer, television/film set  Tobacco Use  . Smoking status: Current Every Day Smoker    Packs/day: 0.50    Types: Cigarettes  . Smokeless tobacco: Never Used  Substance and Sexual Activity  . Alcohol use: Yes    Alcohol/week: 0.0 standard drinks    Comment: occasionally  . Drug use: No  . Sexual activity: Not on file  Other Topics Concern  . Not on file  Social History Narrative   Patient drink about 2 cups of caffeine daily.   Patient is right handed.    Social Determinants of Health   Financial Resource Strain: Not on file  Food Insecurity: Not on file  Transportation Needs: Not on file  Physical Activity: Not on file  Stress: Not on file  Social Connections: Not on file  Intimate Partner Violence: Not on file     Current Outpatient Medications:  .  albuterol (PROVENTIL HFA;VENTOLIN HFA) 108 (90 Base) MCG/ACT inhaler, Inhale 2 puffs into the lungs every 6 (six) hours as needed for wheezing or shortness of breath., Disp: 1 Inhaler, Rfl: 0 .  carbamazepine (TEGRETOL) 200 MG tablet, 1/2 tablet twice a day for 2 weeks, then take 1 tablet  twice a day, Disp: 60 tablet, Rfl: 3 .  cyclobenzaprine (FEXMID) 7.5 MG tablet, Take 1 tablet (7.5 mg total) by mouth 3 (three) times daily as needed for muscle spasms., Disp: 30 tablet, Rfl: 0 .  cyclobenzaprine (FLEXERIL) 10 MG tablet, Take 1 tablet (10 mg total) by mouth 3 (three) times daily as needed for muscle spasms., Disp: 90 tablet, Rfl: 1 .  Fremanezumab-vfrm (AJOVY) 225 MG/1.5ML SOSY, Inject 225 mg into the muscle every 30 (thirty) days., Disp: 1.5 mL, Rfl: 5 .  lisinopril-hydrochlorothiazide (ZESTORETIC) 20-12.5 MG tablet, Take 1 tablet by mouth daily., Disp: 90 tablet, Rfl: 3 .  zonisamide (ZONEGRAN) 100 MG capsule, One capsule in the morning and 2 at night, Disp: 270 capsule, Rfl: 3   Allergies  Allergen Reactions  .  Amoxicillin Anaphylaxis  . Morphine And Related Anaphylaxis  . Penicillins Anaphylaxis    Has patient had a PCN reaction causing immediate rash, facial/tongue/throat swelling, SOB or lightheadedness with hypotension: Yes Has patient had a PCN reaction causing severe rash involving mucus membranes or skin necrosis: Yes Has patient had a PCN reaction that required hospitalization Yes Has patient had a PCN reaction occurring within the last 10 years: Yes If all of the above answers are "NO", then may proceed with Cephalosporin use.   . Rocephin [Ceftriaxone Sodium In Dextrose] Anaphylaxis  . Shellfish Allergy Anaphylaxis  . Tylenol [Acetaminophen] Anaphylaxis  . Topamax [Topiramate] Nausea And Vomiting  . Tramadol     ROS Review of Systems  Constitutional: Negative.  Negative for chills.  HENT: Negative.  Negative for congestion.   Eyes: Negative.  Negative for pain.  Respiratory: Negative.  Negative for chest tightness.   Cardiovascular: Negative.  Negative for chest pain.  Gastrointestinal: Negative.  Negative for abdominal pain.  Endocrine: Negative.   Genitourinary: Negative.   Musculoskeletal: Positive for arthralgias, back pain and myalgias. Negative for gait problem and joint swelling.  Skin: Negative.   Allergic/Immunologic: Negative.   Neurological: Positive for facial asymmetry and weakness.       Patient has left-sided weakness she complains of pain in the left leg and left arm.  The weakness is  on walking 1 x 5.  Patient is working in a nursing home.mild facial assymetry  Is present  Hematological: Negative.   Psychiatric/Behavioral: The patient is nervous/anxious.   All other systems reviewed and are negative.     Objective:    Physical Exam  BP 140/87   Pulse 90   Ht 5' 1.5" (1.562 m)   Wt 208 lb 4.8 oz (94.5 kg)   BMI 38.72 kg/m  Wt Readings from Last 3 Encounters:  01/27/21 208 lb 4.8 oz (94.5 kg)  12/06/20 211 lb 6.4 oz (95.9 kg)  09/06/20 215 lb 9.6  oz (97.8 kg)     Health Maintenance Due  Topic Date Due  . Hepatitis C Screening  Never done  . COVID-19 Vaccine (1) Never done  . HIV Screening  Never done  . TETANUS/TDAP  Never done  . PAP SMEAR-Modifier  Never done  . COLONOSCOPY (Pts 45-25yrs Insurance coverage will need to be confirmed)  Never done    There are no preventive care reminders to display for this patient.  No results found for: TSH Lab Results  Component Value Date   WBC 15.2 (H) 01/19/2021   HGB 13.4 01/19/2021   HCT 42.4 01/19/2021   MCV 83.1 01/19/2021   PLT 341 01/19/2021   Lab Results  Component Value Date  NA 141 01/19/2021   K 3.2 (L) 01/19/2021   CO2 26 01/19/2021   GLUCOSE 112 (H) 01/19/2021   BUN 6 01/19/2021   CREATININE 0.64 01/19/2021   BILITOT 0.3 01/19/2021   ALKPHOS 77 01/19/2021   AST 29 01/19/2021   ALT 33 01/19/2021   PROT 6.9 01/19/2021   ALBUMIN 3.7 01/19/2021   CALCIUM 9.6 01/19/2021   ANIONGAP 8 01/19/2021   No results found for: CHOL No results found for: HDL No results found for: LDLCALC No results found for: TRIG No results found for: CHOLHDL Lab Results  Component Value Date   HGBA1C 6.2 12/06/2020      Assessment & Plan:   Problem List Items Addressed This Visit      Cardiovascular and Mediastinum   Common migraine with intractable migraine - Primary    Chronic problem      Hypertensive heart disease without heart failure    Patient blood pressure is normal patient denies any chest pain or shortness of breath there is no history of palpitation paroxysmal nocturnal dyspnea patient can walkioo yards without any problem patient was advised to follow low-salt low-cholesterol diet  I reviewed the results of Sprint trial  ideally I want to keep systolic blood pressure below 601 mmHg, patient was asked to check blood pressure 3 times a week and give me a report on that.  Patient will be follow-up in 3 months, patient will call me back for any change in the  cardiovascular symptoms           Other   Seizures (HCC)    Refer to neurologist.      Headache    Patient will be referred to neurologist.      Chronic pain    Patient is referred to neurologist.  And back specialist      Prediabetes    - I encouraged the patient to lose weight.  - I educated them on making healthy dietary choices including eating more fruits and vegetables and less fried foods. - I encouraged the patient to exercise more, and educated on the benefits of exercise including weight loss, diabetes prevention, and hypertension prevention.   Dietary counseling with a registered dietician  Referral to a weight management support group (e.g. Weight Watchers, Overeaters Anonymous)  If your BMI is greater than 29 or you have gained more than 15 pounds you should work on weight loss.  Attend a healthy cooking class       Obesity (BMI 30.0-34.9)    Patient was advised to lose at least 8 to 10 pounds of weight.      History of hemorrhagic stroke with residual hemiparesis (HCC)    Patient has a history of hemorrhagic stroke on the right side with weakness of the left side.  She visited the emergency room recently.  Because of the droopiness of the right eye and left-sided weakness.  She has a history of back pain.  She has an appointment with a neurologist on may         No orders of the defined types were placed in this encounter.   Follow-up: No follow-ups on file.    Corky Downs, MD

## 2021-01-27 NOTE — Assessment & Plan Note (Signed)
Patient has a history of hemorrhagic stroke on the right side with weakness of the left side.  She visited the emergency room recently.  Because of the droopiness of the right eye and left-sided weakness.  She has a history of back pain.  She has an appointment with a neurologist on may

## 2021-01-27 NOTE — Assessment & Plan Note (Signed)
Refer to neurologist 

## 2021-01-27 NOTE — Assessment & Plan Note (Signed)
Patient was advised to lose at least 8 to 10 pounds of weight.

## 2021-02-05 ENCOUNTER — Encounter: Payer: Self-pay | Admitting: Internal Medicine

## 2021-02-05 ENCOUNTER — Ambulatory Visit (INDEPENDENT_AMBULATORY_CARE_PROVIDER_SITE_OTHER): Payer: Commercial Managed Care - PPO | Admitting: Internal Medicine

## 2021-02-05 ENCOUNTER — Other Ambulatory Visit: Payer: Self-pay

## 2021-02-05 VITALS — BP 127/80 | HR 94 | Ht 61.0 in | Wt 207.1 lb

## 2021-02-05 DIAGNOSIS — R7303 Prediabetes: Secondary | ICD-10-CM

## 2021-02-05 DIAGNOSIS — I69359 Hemiplegia and hemiparesis following cerebral infarction affecting unspecified side: Secondary | ICD-10-CM

## 2021-02-05 DIAGNOSIS — G4489 Other headache syndrome: Secondary | ICD-10-CM

## 2021-02-05 DIAGNOSIS — I119 Hypertensive heart disease without heart failure: Secondary | ICD-10-CM

## 2021-02-05 DIAGNOSIS — E669 Obesity, unspecified: Secondary | ICD-10-CM

## 2021-02-05 DIAGNOSIS — I611 Nontraumatic intracerebral hemorrhage in hemisphere, cortical: Secondary | ICD-10-CM

## 2021-02-05 DIAGNOSIS — G43019 Migraine without aura, intractable, without status migrainosus: Secondary | ICD-10-CM | POA: Diagnosis not present

## 2021-02-05 NOTE — Assessment & Plan Note (Signed)
Patient is referred to neurologist she has an appointment to see him in a month.

## 2021-02-05 NOTE — Progress Notes (Signed)
Established Patient Office Visit  Subjective:  Patient ID: [name], female    DOB: 1970-11-08  Age: 50 y.o. MRN: [id number]  CC:  Chief Complaint  Patient presents with  . Follow-up    Pt here for follow up migraines     HPI  [name] presents for patient has a history of CVA in 2009 and she was found to have an aneurysm.  She complains of weakness on the legs, migraine headache. She really feels good. She is followed to see her neurologist. Complains of pain in the legs and sometimes numbness on the homicidal multiple other.  She does not smoke anymore. She has seen a neurologist in the past.  For the migraine headache. She is prediabetic but her hemoglobin AIC is 5.6.  She lost weight also, and is diet low-cholesterol.Patient past medical history includes history of hemiplegic migraine hypertensive cardiovascular disease she has a nontraumatic cortical hemorrhage of the right cerebral hemisphere.  Has been told to have a seizure in the past, chronic pain and headache.  She is obese and trying to lose weight.  Past Medical History:  Diagnosis Date  . Brain aneurysm   . Common migraine with intractable migraine 03/03/2017  . Headache   . Hemiplegic migraine 10/07/2015  . 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 Primary hypertension 12/06/2020  . Seizures (HCC)     Past Surgical History:  Procedure Laterality Date  . ABDOMINAL HYSTERECTOMY      Family History  Problem Relation Age of Onset  . Stroke Mother   . Hypertension Father   . Diabetes Father   . Migraines Sister   . Migraines Brother   . Migraines Sister   . Migraines Sister   . Migraines Sister   . Migraines Sister   . Migraines Sister   . Hypertension Paternal Aunt   . Diabetes Paternal Aunt   . Stroke Paternal Aunt   . Hypertension Paternal Uncle   . Diabetes Paternal Uncle   . Stroke Paternal Uncle   . Hypertension Maternal Grandmother   . Aneurysm  Paternal Grandmother   . Diabetes Paternal Grandmother   . Hypertension Paternal Grandfather   . Cancer Paternal Grandfather   . Seizures Cousin     Social History   Socioeconomic History  . Marital status: Married    Spouse name: Not on file  . Number of children: 3  . Years of education: 25  . Highest education level: Not on file  Occupational History  . Occupation: Designer, television/film set  Tobacco Use  . Smoking status: Current Every Day Smoker    Packs/day: 0.50    Types: Cigarettes  . Smokeless tobacco: Never Used  Substance and Sexual Activity  . Alcohol use: Yes    Alcohol/week: 0.0 standard drinks    Comment: occasionally  . Drug use: No  . Sexual activity: Not on file  Other Topics Concern  . Not on file  Social History Narrative   Patient drink about 2 cups of caffeine daily.   Patient is right handed.    Social Determinants of Health   Financial Resource Strain: Not on file  Food Insecurity: Not on file  Transportation Needs: Not on file  Physical Activity: Not on file  Stress: Not on file  Social Connections: Not on file  Intimate Partner Violence: Not on file     Current Outpatient Medications:  .  albuterol (PROVENTIL HFA;VENTOLIN HFA) 108 (90 Base)  MCG/ACT inhaler, Inhale 2 puffs into the lungs every 6 (six) hours as needed for wheezing or shortness of breath., Disp: 1 Inhaler, Rfl: 0 .  carbamazepine (TEGRETOL) 200 MG tablet, 1/2 tablet twice a day for 2 weeks, then take 1 tablet twice a day, Disp: 60 tablet, Rfl: 3 .  cyclobenzaprine (FEXMID) 7.5 MG tablet, Take 1 tablet (7.5 mg total) by mouth 3 (three) times daily as needed for muscle spasms., Disp: 30 tablet, Rfl: 0 .  cyclobenzaprine (FLEXERIL) 10 MG tablet, Take 1 tablet (10 mg total) by mouth 3 (three) times daily as needed for muscle spasms., Disp: 90 tablet, Rfl: 1 .  Fremanezumab-vfrm (AJOVY) 225 MG/1.5ML SOSY, Inject 225 mg into the muscle every 30 (thirty) days., Disp: 1.5 mL, Rfl: 5 .   lisinopril-hydrochlorothiazide (ZESTORETIC) 20-12.5 MG tablet, Take 1 tablet by mouth daily., Disp: 90 tablet, Rfl: 3 .  zonisamide (ZONEGRAN) 100 MG capsule, One capsule in the morning and 2 at night, Disp: 270 capsule, Rfl: 3   Allergies  Allergen Reactions  . Amoxicillin Anaphylaxis  . Morphine And Related Anaphylaxis  . Penicillins Anaphylaxis    Has patient had a PCN reaction causing immediate rash, facial/tongue/throat swelling, SOB or lightheadedness with hypotension: Yes Has patient had a PCN reaction causing severe rash involving mucus membranes or skin necrosis: Yes Has patient had a PCN reaction that required hospitalization Yes Has patient had a PCN reaction occurring within the last 10 years: Yes If all of the above answers are "NO", then may proceed with Cephalosporin use.   . Rocephin [Ceftriaxone Sodium In Dextrose] Anaphylaxis  . Shellfish Allergy Anaphylaxis  . Tylenol [Acetaminophen] Anaphylaxis  . Topamax [Topiramate] Nausea And Vomiting  . Tramadol     ROS Review of Systems  Constitutional: Positive for fatigue. Negative for fever and unexpected weight change.  HENT: Negative.  Negative for congestion and postnasal drip.   Eyes: Negative.   Respiratory: Negative.  Negative for cough, chest tightness and shortness of breath.   Cardiovascular: Negative.   Gastrointestinal: Negative.   Endocrine: Negative.  Negative for polydipsia.  Genitourinary: Negative.  Negative for hematuria.  Musculoskeletal: Positive for arthralgias. Negative for gait problem.  Skin: Negative.   Allergic/Immunologic: Negative.   Neurological: Negative.   Hematological: Negative.   Psychiatric/Behavioral: Positive for dysphoric mood. The patient is not hyperactive.   All other systems reviewed and are negative.     Objective:    Physical Exam Vitals reviewed.  Constitutional:      Appearance: Normal appearance.  HENT:     Mouth/Throat:     Mouth: Mucous membranes are moist.   Eyes:     Pupils: Pupils are equal, round, and reactive to light.  Neck:     Vascular: No carotid bruit.  Cardiovascular:     Rate and Rhythm: Normal rate and regular rhythm.     Pulses: Normal pulses.     Heart sounds: Normal heart sounds.  Pulmonary:     Effort: Pulmonary effort is normal.     Breath sounds: Normal breath sounds.  Abdominal:     General: Bowel sounds are normal.     Palpations: Abdomen is soft. There is no hepatomegaly, splenomegaly or mass.     Tenderness: There is no abdominal tenderness.     Hernia: No hernia is present.  Musculoskeletal:        General: No tenderness.     Cervical back: Neck supple.     Right lower leg: No edema.  Left lower leg: No edema.  Skin:    Findings: No rash.  Neurological:     Mental Status: She is alert and oriented to person, place, and time.     Motor: No weakness.  Psychiatric:        Mood and Affect: Mood and affect normal.        Behavior: Behavior normal.     BP 127/80   Pulse 94   Ht 5\' 1"  (1.549 m)   Wt 207 lb 1.6 oz (93.9 kg)   BMI 39.13 kg/m  Wt Readings from Last 3 Encounters:  02/05/21 207 lb 1.6 oz (93.9 kg)  01/27/21 208 lb 4.8 oz (94.5 kg)  12/06/20 211 lb 6.4 oz (95.9 kg)     Health Maintenance Due  Topic Date Due  . Hepatitis C Screening  Never done  . COVID-19 Vaccine (1) Never done  . HIV Screening  Never done  . TETANUS/TDAP  Never done  . PAP SMEAR-Modifier  Never done  . COLONOSCOPY (Pts 45-31yrs Insurance coverage will need to be confirmed)  Never done    There are no preventive care reminders to display for this patient.  No results found for: TSH Lab Results  Component Value Date   WBC 15.2 (H) 01/19/2021   HGB 13.4 01/19/2021   HCT 42.4 01/19/2021   MCV 83.1 01/19/2021   PLT 341 01/19/2021   Lab Results  Component Value Date   NA 141 01/19/2021   K 3.2 (L) 01/19/2021   CO2 26 01/19/2021   GLUCOSE 112 (H) 01/19/2021   BUN 6 01/19/2021   CREATININE 0.64 01/19/2021    BILITOT 0.3 01/19/2021   ALKPHOS 77 01/19/2021   AST 29 01/19/2021   ALT 33 01/19/2021   PROT 6.9 01/19/2021   ALBUMIN 3.7 01/19/2021   CALCIUM 9.6 01/19/2021   ANIONGAP 8 01/19/2021   No results found for: CHOL No results found for: HDL No results found for: LDLCALC No results found for: TRIG No results found for: CHOLHDL Lab Results  Component Value Date   HGBA1C 6.2 12/06/2020      Assessment & Plan:   Problem List Items Addressed This Visit      Cardiovascular and Mediastinum   Common migraine with intractable migraine - Primary    Patient is referred to neurologist she has an appointment to see him in a month.      Hypertensive heart disease without heart failure    Patient blood pressure is normal patient denies any chest pain or shortness of breath there is no history of palpitation paroxysmal nocturnal dyspnea patient can walk  25yards without any problem patient was advised to follow low-salt low-cholesterol diet  I reviewed the results of Sprint trial  ideally I want to keep systolic blood pressure below 02/03/2021 mmHg, patient was asked to check blood pressure 3 times a week and give me a report on that.  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  MRI reviewed.  Refer to neurology.        Other   Headache    Refer to neurology      Prediabetes    Hemoglobin AIC is 5.6.      Obesity (BMI 30.0-34.9)    -  I encouraged the patient to lose weight.  - I educated them on making healthy dietary choices including eating more fruits and vegetables and less fried foods. - I encouraged the patient to exercise more, and educated on the benefits of exercise including weight loss, diabetes prevention, and hypertension prevention.   Dietary counseling with a registered  dietician  Referral to a weight management support group (e.g. Weight Watchers, Overeaters Anonymous)  If your BMI is greater than 29 or you have gained more than 15 pounds you should work on weight loss.  Attend a healthy cooking class       History of hemorrhagic stroke with residual hemiparesis (HCC)    Refer to neurology         No orders of the defined types were placed in this encounter.   Follow-up: No follow-ups on file.    Corky DownsJaved Saige Canton, MD

## 2021-02-05 NOTE — Assessment & Plan Note (Signed)
Refer to neurology

## 2021-02-05 NOTE — Assessment & Plan Note (Signed)

## 2021-02-05 NOTE — Assessment & Plan Note (Signed)
Stable at the present time.  MRI reviewed.  Refer to neurology.

## 2021-02-05 NOTE — Assessment & Plan Note (Signed)

## 2021-02-05 NOTE — Assessment & Plan Note (Signed)
Hemoglobin AIC is 5.6.

## 2021-02-27 ENCOUNTER — Encounter: Payer: Self-pay | Admitting: Neurology

## 2021-02-27 ENCOUNTER — Ambulatory Visit: Payer: Commercial Managed Care - PPO | Admitting: Neurology

## 2021-02-27 NOTE — Progress Notes (Deleted)
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  She is on Zonegran for seizures.  Has history of headaches, has been on a multitude of medications including Botox.  When last seen in October, carbamazepine was added to help her left-sided pain and seizures.  Went to the ER 01/19/21 as code stroke with left-sided weakness and facial asymmetry, CT head and MRI were negative for stroke.  Felt to be complex migraine.  HISTORY 08/09/2020 Dr. Anne Collins: [name] is a 50 year old right-handed black female with a history of a right parietal intracranial hemorrhage in 2009 resulting in a left hemiparesis.  The patient claims that immediately after this event she noted onset of left-sided headache, left arm and leg pain and discomfort.  The patient has had a chronic left hemiparesis.  She has been on Zonegran for her seizures.  She had stopped her medications at some point over the summer 2021, she was seen in the emergency room on 02 June 2020 with recurrent seizure events.  The patient had some increased weakness on the left side following the seizures that has persisted.  She has been out of work apparently since the beginning of the year in 2021 due to some problems with left-sided dysfunction.  She is back on Zonegran take 100 mg in the morning and 200 mg in the evening.  She has not had any generalized type seizures but she does have some jerks and twitches on the left side that occur mainly at nighttime.  She may have episodes of feeling "foggy headed" and is not sure whether or not this represents a seizure or not.  She does not operate a motor vehicle.  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  She indicates that the headache issue came on at the same  time as the left-sided pain syndrome.  She does have occasional falls, she denies issues controlling the bowels or the bladder.  With the headache, she may have some photophobia and phonophobia, she may see spots in front of the eyes.  Botox was not helpful, she has been on a multitude of medications previously without benefit, Ajovy offers minimal benefit for the headache.  In the past, the use of gabapentin and Lyrica together has offered some improvement in the left-sided pain.  She comes back here for further evaluation.   REVIEW OF SYSTEMS: Out of a complete 14 system review of symptoms, the patient complains only of the following symptoms, and all other reviewed systems are negative.  ALLERGIES: Allergies  Allergen Reactions  . Amoxicillin Anaphylaxis  . Morphine And Related Anaphylaxis  . Penicillins Anaphylaxis    Has patient had a PCN reaction causing immediate rash, facial/tongue/throat swelling, SOB or lightheadedness with hypotension: Yes Has patient had a PCN reaction causing severe rash involving mucus membranes or skin necrosis: Yes Has patient had a PCN reaction that required hospitalization Yes Has patient had a PCN reaction occurring within the last 10 years: Yes If all of the above answers are "NO", then may proceed with Cephalosporin use.   . Rocephin [Ceftriaxone Sodium In Dextrose] Anaphylaxis  . Shellfish Allergy Anaphylaxis  . Tylenol [Acetaminophen] Anaphylaxis  . Topamax [Topiramate] Nausea And Vomiting  . Tramadol     HOME MEDICATIONS: Outpatient Medications Prior to  Visit  Medication Sig Dispense Refill  . albuterol (PROVENTIL HFA;VENTOLIN HFA) 108 (90 Base) MCG/ACT inhaler Inhale 2 puffs into the lungs every 6 (six) hours as needed for wheezing or shortness of breath. 1 Inhaler 0  . carbamazepine (TEGRETOL) 200 MG tablet 1/2 tablet twice a day for 2 weeks, then take 1 tablet twice a day 60 tablet 3  . cyclobenzaprine (FEXMID) 7.5 MG tablet Take 1 tablet (7.5  mg total) by mouth 3 (three) times daily as needed for muscle spasms. 30 tablet 0  . cyclobenzaprine (FLEXERIL) 10 MG tablet Take 1 tablet (10 mg total) by mouth 3 (three) times daily as needed for muscle spasms. 90 tablet 1  . Fremanezumab-vfrm (AJOVY) 225 MG/1.5ML SOSY Inject 225 mg into the muscle every 30 (thirty) days. 1.5 mL 5  . lisinopril-hydrochlorothiazide (ZESTORETIC) 20-12.5 MG tablet Take 1 tablet by mouth daily. 90 tablet 3  . zonisamide (ZONEGRAN) 100 MG capsule One capsule in the morning and 2 at night 270 capsule 3   No facility-administered medications prior to visit.    PAST MEDICAL HISTORY: Past Medical History:  Diagnosis Date  . Brain aneurysm   . Common migraine with intractable migraine 03/03/2017  . Headache   . Hemiplegic migraine 10/07/2015  . 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 Primary hypertension 12/06/2020  . Seizures (HCC)     PAST SURGICAL HISTORY: Past Surgical History:  Procedure Laterality Date  . ABDOMINAL HYSTERECTOMY      FAMILY HISTORY: Family History  Problem Relation Age of Onset  . Stroke Mother   . Hypertension Father   . Diabetes Father   . Migraines Sister   . Migraines Brother   . Migraines Sister   . Migraines Sister   . Migraines Sister   . Migraines Sister   . Migraines Sister   . Hypertension Paternal Aunt   . Diabetes Paternal Aunt   . Stroke Paternal Aunt   . Hypertension Paternal Uncle   . Diabetes Paternal Uncle   . Stroke Paternal Uncle   . Hypertension Maternal Grandmother   . Aneurysm Paternal Grandmother   . Diabetes Paternal Grandmother   . Hypertension Paternal Grandfather   . Cancer Paternal Grandfather   . Seizures Cousin     SOCIAL HISTORY: Social History   Socioeconomic History  . Marital status: Married    Spouse name: Not on file  . Number of children: 3  . Years of education: 64  . Highest education level: Not on file  Occupational History  .  Occupation: Designer, television/film set  Tobacco Use  . Smoking status: Current Every Day Smoker    Packs/day: 0.50    Types: Cigarettes  . Smokeless tobacco: Never Used  Substance and Sexual Activity  . Alcohol use: Yes    Alcohol/week: 0.0 standard drinks    Comment: occasionally  . Drug use: No  . Sexual activity: Not on file  Other Topics Concern  . Not on file  Social History Narrative   Patient drink about 2 cups of caffeine daily.   Patient is right handed.    Social Determinants of Health   Financial Resource Strain: Not on file  Food Insecurity: Not on file  Transportation Needs: Not on file  Physical Activity: Not on file  Stress: Not on file  Social Connections: Not on file  Intimate Partner Violence: Not on file      PHYSICAL EXAM  There were no vitals filed for this  visit. There is no height or weight on file to calculate BMI.  Generalized: Well developed, in no acute distress   Neurological examination  Mentation: Alert oriented to time, place, history taking. Follows all commands speech and language fluent Cranial nerve II-XII: Pupils were equal round reactive to light. Extraocular movements were full, visual field were full on confrontational test. Facial sensation and strength were normal. Uvula tongue midline. Head turning and shoulder shrug  were normal and symmetric. Motor: The motor testing reveals 5 over 5 strength of all 4 extremities. Good symmetric motor tone is noted throughout.  Sensory: Sensory testing is intact to soft touch on all 4 extremities. No evidence of extinction is noted.  Coordination: Cerebellar testing reveals good finger-nose-finger and heel-to-shin bilaterally.  Gait and station: Gait is normal. Tandem gait is normal. Romberg is negative. No drift is seen.  Reflexes: Deep tendon reflexes are symmetric and normal bilaterally.   DIAGNOSTIC DATA (LABS, IMAGING, TESTING) - I reviewed patient records, labs, notes, testing and imaging  myself where available.  Lab Results  Component Value Date   WBC 15.2 (H) 01/19/2021   HGB 13.4 01/19/2021   HCT 42.4 01/19/2021   MCV 83.1 01/19/2021   PLT 341 01/19/2021      Component Value Date/Time   NA 141 01/19/2021 0429   NA 145 08/29/2014 1556   K 3.2 (L) 01/19/2021 0429   K 4.0 08/29/2014 1556   CL 107 01/19/2021 0429   CL 113 (H) 08/29/2014 1556   CO2 26 01/19/2021 0429   CO2 24 08/29/2014 1556   GLUCOSE 112 (H) 01/19/2021 0429   GLUCOSE 87 08/29/2014 1556   BUN 6 01/19/2021 0429   BUN 9 08/29/2014 1556   CREATININE 0.64 01/19/2021 0429   CREATININE 0.74 08/29/2014 1556   CALCIUM 9.6 01/19/2021 0429   CALCIUM 8.5 08/29/2014 1556   PROT 6.9 01/19/2021 0429   PROT 7.9 02/01/2014 1605   ALBUMIN 3.7 01/19/2021 0429   ALBUMIN 4.0 02/01/2014 1605   AST 29 01/19/2021 0429   AST 20 02/01/2014 1605   ALT 33 01/19/2021 0429   ALT 30 02/01/2014 1605   ALKPHOS 77 01/19/2021 0429   ALKPHOS 74 02/01/2014 1605   BILITOT 0.3 01/19/2021 0429   BILITOT 0.4 02/01/2014 1605   GFRNONAA >60 01/19/2021 0429   GFRNONAA >60 08/29/2014 1556   GFRNONAA >60 02/01/2014 1605   GFRAA >60 06/02/2020 1444   GFRAA >60 08/29/2014 1556   GFRAA >60 02/01/2014 1605   No results found for: CHOL, HDL, LDLCALC, LDLDIRECT, TRIG, CHOLHDL Lab Results  Component Value Date   HGBA1C 6.2 12/06/2020   No results found for: VITAMINB12 No results found for: TSH    ASSESSMENT AND PLAN 50 y.o. year old female  has a past medical history of Brain aneurysm, Common migraine with intractable migraine (03/03/2017), Headache, Hemiplegic migraine (10/07/2015), Late effects of cerebral ischemic stroke (02/09/2018), Primary hypertension (12/06/2020), and Seizures (HCC). here with ***   I spent 15 minutes with the patient. 50% of this time was spent   Margie Ege, [place], DNP 02/27/2021, 5:44 AM [facility] [address] [place], [place] [address] [phone]

## 2021-05-07 ENCOUNTER — Ambulatory Visit: Payer: Commercial Managed Care - PPO | Admitting: Internal Medicine

## 2022-02-11 ENCOUNTER — Other Ambulatory Visit: Payer: Self-pay

## 2022-02-11 ENCOUNTER — Encounter: Payer: Self-pay | Admitting: Emergency Medicine

## 2022-02-11 ENCOUNTER — Emergency Department: Payer: Commercial Managed Care - PPO

## 2022-02-11 ENCOUNTER — Emergency Department
Admission: EM | Admit: 2022-02-11 | Discharge: 2022-02-11 | Discharge status: Against Medical Advice | Payer: Commercial Managed Care - PPO | Attending: Emergency Medicine | Admitting: Emergency Medicine

## 2022-02-11 DIAGNOSIS — R200 Anesthesia of skin: Secondary | ICD-10-CM | POA: Insufficient documentation

## 2022-02-11 DIAGNOSIS — Z5321 Procedure and treatment not carried out due to patient leaving prior to being seen by health care provider: Secondary | ICD-10-CM | POA: Diagnosis not present

## 2022-02-11 DIAGNOSIS — R531 Weakness: Secondary | ICD-10-CM | POA: Diagnosis not present

## 2022-02-11 DIAGNOSIS — H538 Other visual disturbances: Secondary | ICD-10-CM | POA: Insufficient documentation

## 2022-02-11 DIAGNOSIS — R519 Headache, unspecified: Secondary | ICD-10-CM | POA: Insufficient documentation

## 2022-02-11 LAB — CBC
HCT: 42.9 % (ref 36.0–46.0)
Hemoglobin: 13.5 g/dL (ref 12.0–15.0)
MCH: 25.4 pg — ABNORMAL LOW (ref 26.0–34.0)
MCHC: 31.5 g/dL (ref 30.0–36.0)
MCV: 80.6 fL (ref 80.0–100.0)
Platelets: 375 10*3/uL (ref 150–400)
RBC: 5.32 MIL/uL — ABNORMAL HIGH (ref 3.87–5.11)
RDW: 14.5 % (ref 11.5–15.5)
WBC: 14.5 10*3/uL — ABNORMAL HIGH (ref 4.0–10.5)
nRBC: 0 % (ref 0.0–0.2)

## 2022-02-11 LAB — DIFFERENTIAL
Abs Immature Granulocytes: 0.04 10*3/uL (ref 0.00–0.07)
Basophils Absolute: 0.1 10*3/uL (ref 0.0–0.1)
Basophils Relative: 0 %
Eosinophils Absolute: 0.3 10*3/uL (ref 0.0–0.5)
Eosinophils Relative: 2 %
Immature Granulocytes: 0 %
Lymphocytes Relative: 40 %
Lymphs Abs: 5.7 10*3/uL — ABNORMAL HIGH (ref 0.7–4.0)
Monocytes Absolute: 0.9 10*3/uL (ref 0.1–1.0)
Monocytes Relative: 6 %
Neutro Abs: 7.5 10*3/uL (ref 1.7–7.7)
Neutrophils Relative %: 52 %
Smear Review: NORMAL

## 2022-02-11 LAB — COMPREHENSIVE METABOLIC PANEL
ALT: 20 U/L (ref 0–44)
AST: 21 U/L (ref 15–41)
Albumin: 3.8 g/dL (ref 3.5–5.0)
Alkaline Phosphatase: 88 U/L (ref 38–126)
Anion gap: 7 (ref 5–15)
BUN: 10 mg/dL (ref 6–20)
CO2: 27 mmol/L (ref 22–32)
Calcium: 9.5 mg/dL (ref 8.9–10.3)
Chloride: 108 mmol/L (ref 98–111)
Creatinine, Ser: 0.64 mg/dL (ref 0.44–1.00)
GFR, Estimated: >60 mL/min (ref >60)
Glucose, Bld: 98 mg/dL (ref 70–99)
Potassium: 3.6 mmol/L (ref 3.5–5.1)
Sodium: 142 mmol/L (ref 135–145)
Total Bilirubin: 0.5 mg/dL (ref 0.3–1.2)
Total Protein: 7.2 g/dL (ref 6.5–8.1)

## 2022-02-11 LAB — CBG MONITORING, ED: Glucose-Capillary: 96 mg/dL (ref 70–99)

## 2022-02-11 LAB — APTT: aPTT: 25 seconds (ref 24–36)

## 2022-02-11 LAB — PROTIME-INR
INR: 1 (ref 0.8–1.2)
Prothrombin Time: 13.2 seconds (ref 11.4–15.2)

## 2022-02-11 NOTE — ED Triage Notes (Addendum)
Pt comes into the ED via POV c/o left side numbness/weakness, headache, and blurred vision.  Pt states she woke up this way this morning.  Pt's last known well was at 22:00 last night.  Pt does admit she has had a headache for a couple days.  Pt in NAD at this time with even and unlabored respirations.  ?

## 2022-06-24 ENCOUNTER — Encounter (HOSPITAL_COMMUNITY): Payer: Self-pay

## 2022-06-24 ENCOUNTER — Encounter: Payer: Self-pay | Admitting: Emergency Medicine

## 2022-06-24 ENCOUNTER — Inpatient Hospital Stay (HOSPITAL_COMMUNITY)
Admit: 2022-06-24 | Discharge: 2022-06-30 | DRG: 100 | Disposition: A | Discharge status: Home / Self Care | Payer: Commercial Managed Care - PPO | Source: Other Acute Inpatient Hospital | Attending: Internal Medicine | Admitting: Internal Medicine

## 2022-06-24 ENCOUNTER — Emergency Department: Payer: Commercial Managed Care - PPO

## 2022-06-24 ENCOUNTER — Inpatient Hospital Stay (HOSPITAL_COMMUNITY): Payer: Commercial Managed Care - PPO

## 2022-06-24 ENCOUNTER — Other Ambulatory Visit: Payer: Self-pay

## 2022-06-24 ENCOUNTER — Emergency Department
Admission: EM | Admit: 2022-06-24 | Discharge: 2022-06-24 | Disposition: A | Discharge status: Other Acute Inpatient Hospital | Payer: Commercial Managed Care - PPO | Attending: Emergency Medicine | Admitting: Emergency Medicine

## 2022-06-24 DIAGNOSIS — Z91128 Patient's intentional underdosing of medication regimen for other reason: Secondary | ICD-10-CM

## 2022-06-24 DIAGNOSIS — Y92009 Unspecified place in unspecified non-institutional (private) residence as the place of occurrence of the external cause: Secondary | ICD-10-CM

## 2022-06-24 DIAGNOSIS — J9600 Acute respiratory failure, unspecified whether with hypoxia or hypercapnia: Secondary | ICD-10-CM

## 2022-06-24 DIAGNOSIS — Z6841 Body Mass Index (BMI) 40.0 and over, adult: Secondary | ICD-10-CM

## 2022-06-24 DIAGNOSIS — R531 Weakness: Secondary | ICD-10-CM

## 2022-06-24 DIAGNOSIS — T4276XA Underdosing of unspecified antiepileptic and sedative-hypnotic drugs, initial encounter: Secondary | ICD-10-CM | POA: Diagnosis present

## 2022-06-24 DIAGNOSIS — G894 Chronic pain syndrome: Secondary | ICD-10-CM

## 2022-06-24 DIAGNOSIS — I10 Essential (primary) hypertension: Secondary | ICD-10-CM | POA: Diagnosis present

## 2022-06-24 DIAGNOSIS — D72829 Elevated white blood cell count, unspecified: Secondary | ICD-10-CM | POA: Diagnosis present

## 2022-06-24 DIAGNOSIS — Z8673 Personal history of transient ischemic attack (TIA), and cerebral infarction without residual deficits: Secondary | ICD-10-CM

## 2022-06-24 DIAGNOSIS — G40101 Localization-related (focal) (partial) symptomatic epilepsy and epileptic syndromes with simple partial seizures, not intractable, with status epilepticus: Principal | ICD-10-CM | POA: Diagnosis present

## 2022-06-24 DIAGNOSIS — I808 Phlebitis and thrombophlebitis of other sites: Secondary | ICD-10-CM | POA: Diagnosis present

## 2022-06-24 DIAGNOSIS — Z789 Other specified health status: Secondary | ICD-10-CM

## 2022-06-24 DIAGNOSIS — R471 Dysarthria and anarthria: Secondary | ICD-10-CM

## 2022-06-24 DIAGNOSIS — E876 Hypokalemia: Secondary | ICD-10-CM | POA: Diagnosis present

## 2022-06-24 DIAGNOSIS — R52 Pain, unspecified: Secondary | ICD-10-CM | POA: Diagnosis not present

## 2022-06-24 DIAGNOSIS — M7989 Other specified soft tissue disorders: Secondary | ICD-10-CM | POA: Diagnosis not present

## 2022-06-24 DIAGNOSIS — G43909 Migraine, unspecified, not intractable, without status migrainosus: Secondary | ICD-10-CM | POA: Diagnosis present

## 2022-06-24 DIAGNOSIS — G8384 Todd's paralysis (postepileptic): Secondary | ICD-10-CM | POA: Diagnosis present

## 2022-06-24 DIAGNOSIS — R519 Headache, unspecified: Secondary | ICD-10-CM | POA: Diagnosis present

## 2022-06-24 DIAGNOSIS — J9601 Acute respiratory failure with hypoxia: Secondary | ICD-10-CM | POA: Diagnosis present

## 2022-06-24 DIAGNOSIS — G40901 Epilepsy, unspecified, not intractable, with status epilepticus: Secondary | ICD-10-CM | POA: Diagnosis not present

## 2022-06-24 DIAGNOSIS — Z79899 Other long term (current) drug therapy: Secondary | ICD-10-CM | POA: Diagnosis not present

## 2022-06-24 DIAGNOSIS — R410 Disorientation, unspecified: Secondary | ICD-10-CM | POA: Diagnosis not present

## 2022-06-24 LAB — COMPREHENSIVE METABOLIC PANEL
ALT: 29 U/L (ref 0–44)
AST: 31 U/L (ref 15–41)
Albumin: 3.8 g/dL (ref 3.5–5.0)
Alkaline Phosphatase: 88 U/L (ref 38–126)
Anion gap: 12 (ref 5–15)
BUN: 11 mg/dL (ref 6–20)
CO2: 22 mmol/L (ref 22–32)
Calcium: 9.1 mg/dL (ref 8.9–10.3)
Chloride: 110 mmol/L (ref 98–111)
Creatinine, Ser: 0.67 mg/dL (ref 0.44–1.00)
GFR, Estimated: >60 mL/min (ref >60)
Glucose, Bld: 117 mg/dL — ABNORMAL HIGH (ref 70–99)
Potassium: 3.4 mmol/L — ABNORMAL LOW (ref 3.5–5.1)
Sodium: 144 mmol/L (ref 135–145)
Total Bilirubin: 0.7 mg/dL (ref 0.3–1.2)
Total Protein: 7.1 g/dL (ref 6.5–8.1)

## 2022-06-24 LAB — CBC
HCT: 40.3 % (ref 36.0–46.0)
HCT: 40.8 % (ref 36.0–46.0)
Hemoglobin: 12.9 g/dL (ref 12.0–15.0)
Hemoglobin: 13.1 g/dL (ref 12.0–15.0)
MCH: 26 pg (ref 26.0–34.0)
MCH: 26 pg (ref 26.0–34.0)
MCHC: 32 g/dL (ref 30.0–36.0)
MCHC: 32.1 g/dL (ref 30.0–36.0)
MCV: 81.1 fL (ref 80.0–100.0)
MCV: 81.1 fL (ref 80.0–100.0)
Platelets: 366 10*3/uL (ref 150–400)
Platelets: 277 10*3/uL (ref 150–400)
RBC: 4.97 MIL/uL (ref 3.87–5.11)
RBC: 5.03 MIL/uL (ref 3.87–5.11)
RDW: 14.7 % (ref 11.5–15.5)
RDW: 14.9 % (ref 11.5–15.5)
WBC: 15.9 10*3/uL — ABNORMAL HIGH (ref 4.0–10.5)
WBC: 16.3 10*3/uL — ABNORMAL HIGH (ref 4.0–10.5)
nRBC: 0 % (ref 0.0–0.2)
nRBC: 0 % (ref 0.0–0.2)

## 2022-06-24 LAB — URINALYSIS, ROUTINE W REFLEX MICROSCOPIC
Bilirubin Urine: NEGATIVE
Glucose, UA: NEGATIVE mg/dL
Ketones, ur: NEGATIVE mg/dL
Leukocytes,Ua: NEGATIVE
Nitrite: NEGATIVE
Protein, ur: NEGATIVE mg/dL
Specific Gravity, Urine: 1.019 (ref 1.005–1.030)
pH: 6 (ref 5.0–8.0)

## 2022-06-24 LAB — BLOOD GAS, ARTERIAL
Acid-Base Excess: 4.2 mmol/L — ABNORMAL HIGH (ref 0.0–2.0)
Bicarbonate: 29.2 mmol/L — ABNORMAL HIGH (ref 20.0–28.0)
FIO2: 50 %
MECHVT: 450 mL
Mechanical Rate: 18
O2 Saturation: 100 %
PEEP: 5 cmH2O
Patient temperature: 37
pCO2 arterial: 44 mmHg (ref 32–48)
pH, Arterial: 7.43 (ref 7.35–7.45)
pO2, Arterial: 130 mmHg — ABNORMAL HIGH (ref 83–108)

## 2022-06-24 LAB — PROTIME-INR
INR: 1 (ref 0.8–1.2)
Prothrombin Time: 13.2 seconds (ref 11.4–15.2)

## 2022-06-24 LAB — DIFFERENTIAL
Abs Immature Granulocytes: 0.07 10*3/uL (ref 0.00–0.07)
Basophils Absolute: 0.1 10*3/uL (ref 0.0–0.1)
Basophils Relative: 0 %
Eosinophils Absolute: 0.3 10*3/uL (ref 0.0–0.5)
Eosinophils Relative: 2 %
Immature Granulocytes: 0 %
Lymphocytes Relative: 39 %
Lymphs Abs: 6.2 10*3/uL — ABNORMAL HIGH (ref 0.7–4.0)
Monocytes Absolute: 0.9 10*3/uL (ref 0.1–1.0)
Monocytes Relative: 6 %
Neutro Abs: 8.4 10*3/uL — ABNORMAL HIGH (ref 1.7–7.7)
Neutrophils Relative %: 53 %
Smear Review: NORMAL

## 2022-06-24 LAB — URINE DRUG SCREEN, QUALITATIVE (ARMC ONLY)
Amphetamines, Ur Screen: NOT DETECTED
Barbiturates, Ur Screen: NOT DETECTED
Benzodiazepine, Ur Scrn: POSITIVE — AB
Cannabinoid 50 Ng, Ur ~~LOC~~: NOT DETECTED
Cocaine Metabolite,Ur ~~LOC~~: NOT DETECTED
MDMA (Ecstasy)Ur Screen: NOT DETECTED
Methadone Scn, Ur: NOT DETECTED
Opiate, Ur Screen: NOT DETECTED
Phencyclidine (PCP) Ur S: NOT DETECTED
Tricyclic, Ur Screen: NOT DETECTED

## 2022-06-24 LAB — CBG MONITORING, ED: Glucose-Capillary: 114 mg/dL — ABNORMAL HIGH (ref 70–99)

## 2022-06-24 LAB — CREATININE, SERUM
Creatinine, Ser: 0.69 mg/dL (ref 0.44–1.00)
GFR, Estimated: >60 mL/min (ref >60)

## 2022-06-24 LAB — APTT: aPTT: 23 seconds — ABNORMAL LOW (ref 24–36)

## 2022-06-24 LAB — GLUCOSE, CAPILLARY
Glucose-Capillary: 114 mg/dL — ABNORMAL HIGH (ref 70–99)
Glucose-Capillary: 138 mg/dL — ABNORMAL HIGH (ref 70–99)

## 2022-06-24 LAB — MRSA NEXT GEN BY PCR, NASAL: MRSA by PCR Next Gen: NOT DETECTED

## 2022-06-24 LAB — HIV ANTIBODY (ROUTINE TESTING W REFLEX): HIV Screen 4th Generation wRfx: NONREACTIVE

## 2022-06-24 LAB — ETHANOL: Alcohol, Ethyl (B): <10 mg/dL (ref <10)

## 2022-06-24 MED ORDER — POLYETHYLENE GLYCOL 3350 17 G PO PACK
17.0000 g | PACK | Freq: Every day | ORAL | Status: DC
  Administered 2022-06-24 – 2022-06-25 (×2): 17 g
  Filled 2022-06-24 (×2): qty 1

## 2022-06-24 MED ORDER — ORAL CARE MOUTH RINSE
15.0000 mL | OROMUCOSAL | Status: DC
  Administered 2022-06-24 – 2022-06-25 (×11): 15 mL via OROMUCOSAL

## 2022-06-24 MED ORDER — INSULIN ASPART 100 UNIT/ML IJ SOLN
0.0000 [IU] | INTRAMUSCULAR | Status: DC
  Administered 2022-06-24 – 2022-06-25 (×2): 2 [IU] via SUBCUTANEOUS

## 2022-06-24 MED ORDER — SODIUM CHLORIDE 0.9 % IV SOLN
75.0000 mL/h | INTRAVENOUS | Status: DC
  Administered 2022-06-24: 75 mL/h via INTRAVENOUS

## 2022-06-24 MED ORDER — SENNOSIDES-DOCUSATE SODIUM 8.6-50 MG PO TABS: 1.0000 | ORAL_TABLET | Freq: Every evening | ORAL | Status: DC | PRN

## 2022-06-24 MED ORDER — LEVETIRACETAM IN NACL 1000 MG/100ML IV SOLN
1000.0000 mg | Freq: Once | INTRAVENOUS | Status: AC
  Administered 2022-06-24: 1000 mg via INTRAVENOUS
  Filled 2022-06-24: qty 100

## 2022-06-24 MED ORDER — PROPOFOL 1000 MG/100ML IV EMUL
INTRAVENOUS | Status: AC
  Administered 2022-06-24: 130 mg via INTRAVENOUS
  Filled 2022-06-24: qty 100

## 2022-06-24 MED ORDER — SORBITOL 70 % SOLN: 30.0000 mL | Freq: Every day | Status: DC | PRN

## 2022-06-24 MED ORDER — LORAZEPAM 2 MG/ML IJ SOLN
4.0000 mg | Freq: Once | INTRAMUSCULAR | Status: DC
  Administered 2022-06-24: 4 mg via INTRAVENOUS

## 2022-06-24 MED ORDER — FENTANYL CITRATE PF 50 MCG/ML IJ SOSY: 50.0000 ug | PREFILLED_SYRINGE | INTRAMUSCULAR | Status: DC | PRN

## 2022-06-24 MED ORDER — LEVETIRACETAM IN NACL 1000 MG/100ML IV SOLN
1000.0000 mg | Freq: Once | INTRAVENOUS | Status: AC
  Filled 2022-06-24: qty 100

## 2022-06-24 MED ORDER — LACTATED RINGERS IV SOLN: INTRAVENOUS | Status: DC

## 2022-06-24 MED ORDER — SODIUM CHLORIDE 0.9 % IV SOLN: 4500.0000 mg | Freq: Once | INTRAVENOUS | Status: DC

## 2022-06-24 MED ORDER — LEVETIRACETAM IN NACL 1500 MG/100ML IV SOLN
1500.0000 mg | Freq: Two times a day (BID) | INTRAVENOUS | Status: DC
  Administered 2022-06-24 – 2022-06-26 (×4): 1500 mg via INTRAVENOUS
  Filled 2022-06-24 (×3): qty 100

## 2022-06-24 MED ORDER — IOHEXOL 350 MG/ML SOLN
100.0000 mL | Freq: Once | INTRAVENOUS | Status: AC | PRN
  Administered 2022-06-24: 100 mL via INTRAVENOUS

## 2022-06-24 MED ORDER — MIDAZOLAM-SODIUM CHLORIDE 100-0.9 MG/100ML-% IV SOLN
INTRAVENOUS | Status: AC
  Administered 2022-06-24: 4 mg/h via INTRAVENOUS
  Filled 2022-06-24: qty 100

## 2022-06-24 MED ORDER — PANTOPRAZOLE 2 MG/ML SUSPENSION
40.0000 mg | Freq: Every day | ORAL | Status: DC
  Administered 2022-06-24: 40 mg
  Filled 2022-06-24: qty 20

## 2022-06-24 MED ORDER — MIDAZOLAM-SODIUM CHLORIDE 100-0.9 MG/100ML-% IV SOLN: 4.0000 mg/h | INTRAVENOUS | Status: DC

## 2022-06-24 MED ORDER — LORAZEPAM 2 MG/ML IJ SOLN
INTRAMUSCULAR | Status: AC
  Filled 2022-06-24: qty 2

## 2022-06-24 MED ORDER — ROCURONIUM BROMIDE 10 MG/ML (PF) SYRINGE
PREFILLED_SYRINGE | INTRAVENOUS | Status: AC
  Administered 2022-06-24: 130 mg via INTRAVENOUS
  Filled 2022-06-24: qty 10

## 2022-06-24 MED ORDER — ORAL CARE MOUTH RINSE: 15.0000 mL | OROMUCOSAL | Status: DC

## 2022-06-24 MED ORDER — LORAZEPAM 2 MG/ML IJ SOLN
INTRAMUSCULAR | Status: AC
  Administered 2022-06-24: 4 mg via INTRAVENOUS
  Filled 2022-06-24: qty 2

## 2022-06-24 MED ORDER — LEVETIRACETAM IN NACL 1500 MG/100ML IV SOLN: 1500.0000 mg | Freq: Two times a day (BID) | INTRAVENOUS | Status: DC

## 2022-06-24 MED ORDER — DOCUSATE SODIUM 50 MG/5ML PO LIQD
100.0000 mg | Freq: Two times a day (BID) | ORAL | Status: DC
  Administered 2022-06-24 – 2022-06-25 (×2): 100 mg
  Filled 2022-06-24 (×2): qty 10

## 2022-06-24 MED ORDER — FOLIC ACID 1 MG PO TABS
1.0000 mg | ORAL_TABLET | Freq: Every day | ORAL | Status: DC
  Administered 2022-06-24 – 2022-06-25 (×2): 1 mg
  Filled 2022-06-24 (×3): qty 1

## 2022-06-24 MED ORDER — PROPOFOL 1000 MG/100ML IV EMUL
0.0000 ug/kg/min | INTRAVENOUS | Status: DC
  Administered 2022-06-24 (×2): 40 ug/kg/min via INTRAVENOUS
  Administered 2022-06-25: 20 ug/kg/min via INTRAVENOUS
  Administered 2022-06-25 (×2): 40 ug/kg/min via INTRAVENOUS
  Filled 2022-06-24 (×4): qty 100

## 2022-06-24 MED ORDER — ROCURONIUM BROMIDE 10 MG/ML (PF) SYRINGE: 130.0000 mg | PREFILLED_SYRINGE | Freq: Once | INTRAVENOUS | Status: AC

## 2022-06-24 MED ORDER — MIDAZOLAM-SODIUM CHLORIDE 100-0.9 MG/100ML-% IV SOLN
0.0000 mg/h | INTRAVENOUS | Status: DC
  Administered 2022-06-24 – 2022-06-25 (×2): 4 mg/h via INTRAVENOUS
  Filled 2022-06-24: qty 100

## 2022-06-24 MED ORDER — ORAL CARE MOUTH RINSE: 15.0000 mL | OROMUCOSAL | Status: DC | PRN

## 2022-06-24 MED ORDER — ADULT MULTIVITAMIN W/MINERALS CH
1.0000 | ORAL_TABLET | Freq: Every day | ORAL | Status: DC
  Administered 2022-06-24 – 2022-06-25 (×2): 1
  Filled 2022-06-24 (×3): qty 1

## 2022-06-24 MED ORDER — FENTANYL CITRATE PF 50 MCG/ML IJ SOSY
50.0000 ug | PREFILLED_SYRINGE | Freq: Once | INTRAMUSCULAR | Status: AC
  Administered 2022-06-24: 50 ug via INTRAVENOUS
  Filled 2022-06-24: qty 1

## 2022-06-24 MED ORDER — ETOMIDATE 2 MG/ML IV SOLN
INTRAVENOUS | Status: AC
  Filled 2022-06-24: qty 10

## 2022-06-24 MED ORDER — ENOXAPARIN SODIUM 40 MG/0.4ML IJ SOSY
40.0000 mg | PREFILLED_SYRINGE | INTRAMUSCULAR | Status: DC
  Administered 2022-06-24 – 2022-06-28 (×5): 40 mg via SUBCUTANEOUS
  Filled 2022-06-24 (×5): qty 0.4

## 2022-06-24 MED ORDER — MIDAZOLAM BOLUS VIA INFUSION: 0.0000 mg | INTRAVENOUS | Status: DC | PRN

## 2022-06-24 MED ORDER — PROPOFOL 10 MG/ML IV BOLUS: 130.0000 mg | Freq: Once | INTRAVENOUS | Status: AC

## 2022-06-24 MED ORDER — LEVETIRACETAM IN NACL 1000 MG/100ML IV SOLN
INTRAVENOUS | Status: AC
  Administered 2022-06-24: 1000 mg via INTRAVENOUS
  Filled 2022-06-24: qty 100

## 2022-06-24 MED ORDER — THIAMINE HCL 100 MG/ML IJ SOLN
100.0000 mg | Freq: Every day | INTRAMUSCULAR | Status: DC
Start: 2022-06-24 — End: 2022-06-30
  Administered 2022-06-24 – 2022-06-30 (×5): 100 mg via INTRAVENOUS
  Filled 2022-06-24 (×8): qty 2

## 2022-06-24 MED ORDER — SODIUM CHLORIDE 0.9% FLUSH
3.0000 mL | Freq: Once | INTRAVENOUS | Status: AC
  Administered 2022-06-24: 3 mL via INTRAVENOUS

## 2022-06-24 MED ORDER — CARBAMAZEPINE 200 MG PO TABS
200.0000 mg | ORAL_TABLET | Freq: Two times a day (BID) | ORAL | Status: DC
  Administered 2022-06-24 – 2022-06-25 (×2): 200 mg
  Filled 2022-06-24 (×3): qty 1

## 2022-06-24 MED ORDER — LEVETIRACETAM IN NACL 1000 MG/100ML IV SOLN
1000.0000 mg | Freq: Once | INTRAVENOUS | Status: AC
  Administered 2022-06-24: 1000 mg via INTRAVENOUS

## 2022-06-24 MED ORDER — LEVETIRACETAM IN NACL 500 MG/100ML IV SOLN
500.0000 mg | Freq: Once | INTRAVENOUS | Status: AC
  Administered 2022-06-24: 500 mg via INTRAVENOUS
  Filled 2022-06-24: qty 100

## 2022-06-24 MED ORDER — LORAZEPAM 2 MG/ML IJ SOLN
4.0000 mg | Freq: Once | INTRAMUSCULAR | Status: AC
  Filled 2022-06-24: qty 2

## 2022-06-24 MED ORDER — PROPOFOL 1000 MG/100ML IV EMUL
5.0000 ug/kg/min | INTRAVENOUS | Status: DC
  Administered 2022-06-24: 10 ug/kg/min via INTRAVENOUS
  Administered 2022-06-24: 40 ug/kg/min via INTRAVENOUS
  Filled 2022-06-24: qty 100

## 2022-06-24 MED ORDER — ROCURONIUM BROMIDE 10 MG/ML (PF) SYRINGE
PREFILLED_SYRINGE | INTRAVENOUS | Status: AC
  Filled 2022-06-24: qty 10

## 2022-06-24 NOTE — ED Provider Notes (Signed)
[facility] Provider Note    Event Date/Time   First MD Initiated Contact with Patient 06/24/22 1302     (approximate)   History   Chief Complaint: Code Stroke   HPI  [name] is a 51 y.o. female with a history of migraines, obesity, hypertension, cerebral aneurysm who reports rapid onset at about 11:00 AM today of headache, slurred speech, facial droop, left-sided weakness.  No seizure or loss of consciousness.  Does not take blood thinners.  No vomiting.  No chest pain neck pain shortness of breath.  No fever  Prior CT Angio head from 2015 reviewed, showing evidence of remote prior ICH at that time, but no aneurysm at that time.     Physical Exam   Triage Vital Signs: ED Triage Vitals  Enc Vitals Group     BP      Pulse      Resp      Temp      Temp src      SpO2      Weight      Height      Head Circumference      Peak Flow      Pain Score      Pain Loc      Pain Edu?      Excl. in GC?     Most recent vital signs: Vitals:   06/24/22 1450 06/24/22 1455  BP: (!) 144/89 (!) 151/87  Pulse: 95 89  Resp: (!) 22 19  Temp: (!) 96.8 F (36 C) (!) 96.9 F (36.1 C)  SpO2: 99% 99%    General: Awake, no distress.  CV:  Good peripheral perfusion.  Regular rate and rhythm Resp:  Normal effort.  Clear to auscultation bilaterally Abd:  No distention.  Other:  Left facial droop.  Slurred speech, intact orientation and language.  Pronounced weakness LLE and LUE. NIHSS =6   ED Results / Procedures / Treatments   Labs (all labs ordered are listed, but only abnormal results are displayed) Labs Reviewed  APTT - Abnormal; Notable for the following components:      Result Value   aPTT 23 (*)    All other components within normal limits  CBC - Abnormal; Notable for the following components:   WBC 15.9 (*)    All other components within normal limits  DIFFERENTIAL - Abnormal; Notable for the following components:   Neutro Abs 8.4 (*)     Lymphs Abs 6.2 (*)    All other components within normal limits  COMPREHENSIVE METABOLIC PANEL - Abnormal; Notable for the following components:   Potassium 3.4 (*)    Glucose, Bld 117 (*)    All other components within normal limits  BLOOD GAS, ARTERIAL - Abnormal; Notable for the following components:   pO2, Arterial 130 (*)    Bicarbonate 29.2 (*)    Acid-Base Excess 4.2 (*)    All other components within normal limits  CBG MONITORING, ED - Abnormal; Notable for the following components:   Glucose-Capillary 114 (*)    All other components within normal limits  URINE CULTURE  PROTIME-INR  ETHANOL  MISC [facility] TEST (SEND OUT)  MISC [facility] TEST (SEND OUT)  URINE DRUG SCREEN, QUALITATIVE ([facility] ONLY)  URINALYSIS, ROUTINE W REFLEX MICROSCOPIC     EKG    RADIOLOGY CT head interpreted by me, negative for mass or intracranial hemorrhage.  Radiology report reviewed   PROCEDURES:  .Critical Care  Performed by: Sharman Cheek, MD Authorized by: Sharman Cheek, MD   Critical care provider statement:    Critical care time (minutes):  35   Critical care time was exclusive of:  Separately billable procedures and treating other patients   Critical care was necessary to treat or prevent imminent or life-threatening deterioration of the following conditions:  CNS failure or compromise and respiratory failure   Critical care was time spent personally by me on the following activities:  Development of treatment plan with patient or surrogate, discussions with consultants, evaluation of patient's response to treatment, examination of patient, obtaining history from patient or surrogate, ordering and performing treatments and interventions, ordering and review of laboratory studies, ordering and review of radiographic studies, pulse oximetry, re-evaluation of patient's condition and review of old charts   Care discussed with: accepting provider at another facility   Comments:         Procedure Name: Intubation Date/Time: 06/24/2022 1:59 PM  Performed by: Sharman Cheek, MDPre-anesthesia Checklist: Patient identified, Patient being monitored, Emergency Drugs available, Timeout performed and Suction available Oxygen Delivery Method: Non-rebreather mask Preoxygenation: Pre-oxygenation with 100% oxygen Induction Type: Rapid sequence Ventilation: Mask ventilation without difficulty Laryngoscope Size: Glidescope and 3 Grade View: Grade I Tube size: 7.5 mm Number of attempts: 1 Airway Equipment and Method: Video-laryngoscopy Placement Confirmation: ETT inserted through vocal cords under direct vision, CO2 detector and Breath sounds checked- equal and bilateral Secured at: 23 cm Tube secured with: ETT holder Dental Injury: Teeth and Oropharynx as per pre-operative assessment  Comments:       OG placement  Date/Time: 06/24/2022 1:59 PM  Performed by: Sharman Cheek, MD Authorized by: Sharman Cheek, MD  Consent: The procedure was performed in an emergent situation. Local anesthesia used: no  Anesthesia: Local anesthesia used: no Patient tolerance: patient tolerated the procedure well with no immediate complications Comments: OG placed by me with assistance from video laryngoscopy after endotracheal intubation     .1-3 Lead EKG Interpretation  Performed by: Sharman Cheek, MD Authorized by: Sharman Cheek, MD     Interpretation: normal     ECG rate:  80   ECG rate assessment: normal     Rhythm: sinus rhythm     Ectopy: none     Conduction: normal   Comments:          MEDICATIONS ORDERED IN ED: Medications  LORazepam (ATIVAN) 2 MG/ML injection (  Not Given 06/24/22 1418)  propofol (DIPRIVAN) 1000 MG/100ML infusion (10 mcg/kg/min  100.8 kg Intravenous New Bag/Given 06/24/22 1355)  midazolam (VERSED) 100 mg/100 mL (1 mg/mL) premix infusion (4 mg/hr Intravenous New Bag/Given 06/24/22 1403)  levETIRAcetam (KEPPRA) IVPB 1500 mg/ 100 mL  premix (has no administration in time range)  sodium chloride flush (NS) 0.9 % injection 3 mL (3 mLs Intravenous Given 06/24/22 1345)  LORazepam (ATIVAN) injection 4 mg (4 mg Intravenous Given 06/24/22 1344)  levETIRAcetam (KEPPRA) IVPB 1000 mg/100 mL premix (0 mg Intravenous Stopped 06/24/22 1435)    Followed by  levETIRAcetam (KEPPRA) IVPB 1000 mg/100 mL premix (0 mg Intravenous Stopped 06/24/22 1351)    Followed by  levETIRAcetam (KEPPRA) IVPB 1000 mg/100 mL premix (0 mg Intravenous Stopped 06/24/22 1404)    Followed by  levETIRAcetam (KEPPRA) IVPB 500 mg/100 mL premix (0 mg Intravenous Stopped 06/24/22 1420)  levETIRAcetam (KEPPRA) IVPB 1000 mg/100 mL premix (0 mg Intravenous Stopped 06/24/22 1420)  propofol (DIPRIVAN) 10 mg/mL bolus/IV push 130 mg (130 mg Intravenous Given 06/24/22 1347)  rocuronium bromide  10 mg/mL (PF) syringe (130 mg Intravenous Given 06/24/22 1350)  fentaNYL (SUBLIMAZE) injection 50 mcg (50 mcg Intravenous Given 06/24/22 1459)     IMPRESSION / MDM / ASSESSMENT AND PLAN / ED COURSE  I reviewed the triage vital signs and the nursing notes.                              Differential diagnosis includes, but is not limited to, acute ischemic stroke, intracranial hemorrhage, intracranial mass, cerebral edema, complicated migraine  Patient's presentation is most consistent with acute presentation with potential threat to life or bodily function.  Seen on arrival by EMS together with neurology Dr. Selina Cooley.  Presentation concerning for ischemic stroke, possibility of large vessel occlusion in MCA distribution.  Will obtain stat CT head, likely proceed to CTA/CTP.  Patient appears to be a candidate for TNK, pending CT outcome.   Clinical Course as of 06/24/22 1502  Wed Jun 24, 2022  1324 CT head negative.  Review of history shows similar symptoms in the past from seizures.  Proceeding to CTA/CTP.  Patient given 4 mg IV Ativan and CT with rapid improvement of her neuro exam but  persistent deficits.. [PS]  1355 CTA aborted due to patient having recurrent seizures despite 4 mg of IV Ativan.  She was given additional Ativan in the ED treatment room and continued to seize, even while Keppra bolus was infusing.  Patient receiving full dose weight-based Keppra.  She had to be intubated for airway protection and will be transferred to [facility] ICU. [PS]    Clinical Course User Index [PS] Sharman Cheek, MD    ----------------------------------------- 3:02 PM on 06/24/2022 ----------------------------------------- Case discussed with [facility] intensivist Dr. Denese Killings who accepts for transfer.  Will obtain CTA in the meantime while waiting for [facility] transport.   FINAL CLINICAL IMPRESSION(S) / ED DIAGNOSES   Final diagnoses:  Status epilepticus (HCC)  Todd's paralysis (HCC)     Rx / DC Orders   ED Discharge Orders     None        Note:  This document was prepared using Dragon voice recognition software and may include unintentional dictation errors.   Sharman Cheek, MD 06/24/22 [phone]

## 2022-06-24 NOTE — ED Notes (Signed)
2 mg bolus of versed from IV bag given at this time. Verbal Order given by Sharyn Creamer, MD.

## 2022-06-24 NOTE — Progress Notes (Signed)
[facility] Physician-Brief Progress Note Patient Name: [name] DOB: 1971-03-25 MRN: [id number]   Date of Service  06/24/2022  HPI/Events of Note  Nursing reports orders for LR and 0.9 NaCl IV infusions. Na+ = 144, K+ = 3.4 and Creatinine = 0.69.   eICU Interventions  Plan: D/C 0.9 NaCl IV infusion.  Continue LR IV infusion.      Intervention Category Major Interventions: Other:  Lenell Antu 06/24/2022, 8:19 PM

## 2022-06-24 NOTE — ED Notes (Signed)
Received call from [facility] from [facility] bed assigned  [id number]  report [phone]   1600

## 2022-06-24 NOTE — ED Notes (Signed)
Called [facility] for transfer spoke to [place]  1400

## 2022-06-24 NOTE — Progress Notes (Signed)
Pt's NG tube noted to be measured at 60 cm prior. At 50 cm during my assessment. Abd xray taken and NG placement verified.

## 2022-06-24 NOTE — ED Notes (Signed)
2 mg bolus of versed given from bag. Verbal Order given by Sharyn Creamer, MD.

## 2022-06-24 NOTE — Code Documentation (Addendum)
Stroke Response Nurse Documentation Code Documentation  [name] is a 51 y.o. female arriving to [facility] via [facility] on 06/24/2022 with past medical hx of seizures, migraines, HTN, brain aneurysm. On No antithrombotic. Code stroke was activated by EMS.   Patient from work where she was LKW at 1100 and now complaining of headache, left sided weakness and slurred speech. Pt found by her coworkers confused, slurring her speech, with L sided weakness. She had no observed seizure activity at that time. While in CT pt began having rythmic shaking in left arm. 4mg  ativan given in CT per MD order. Pt began seizing again and was taken from CT to ED room where she was intubated.   Stroke team at the bedside on patient arrival. Labs drawn and patient cleared for CT by Dr. . Patient to CT with team. NIHSS 11, see documentation for details and code stroke times. Patient with decreased LOC, left facial droop, left arm weakness, left leg weakness, left decreased sensation, Expressive aphasia , dysarthria , and left neglect on exam. The following imaging was completed:  CT Head, CTA, and CTP. Patient is not a candidate for IV Thrombolytic due to history of ICH per MD. Patient is not a candidate for IR due to stroke not suspected per MD.   Care Plan: VS/neuro checks Q2h.   Bedside handoff with ED RN Scotty Court.    Cicero Duck  Stroke Response RN

## 2022-06-24 NOTE — ED Notes (Signed)
Printed consent for transfer signed by husband and witnessed by this RN.

## 2022-06-24 NOTE — ED Provider Notes (Signed)
Vitals:   06/24/22 1515 06/24/22 1545  BP: (!) 176/104 (!) 153/77  Pulse: 91 90  Resp: 13 (!) 22  Temp: (!) 97.1 F (36.2 C) (!) 97.3 F (36.3 C)  SpO2: 98% 96%    Patient noted to be moving somewhat biting at endotracheal tube during suctioning.  No obvious seizure-like activity to noted granted she is intubated and has recently had long-acting paralytic.  Will increase sedation including slight up titration titration and propofol as well as provide 2 mg Versed bolus.   Sharyn Creamer, MD 06/24/22 [phone]

## 2022-06-24 NOTE — ED Provider Notes (Signed)
Vitals:   06/24/22 1610 06/24/22 1625  BP: 132/73 138/75  Pulse: 90 91  Resp: 20 20  Temp: 97.6 F (36.4 C) 97.8 F (36.6 C)  SpO2: 94% 100%     Reassessment.  Patient appropriate for transfer.  Discussed with the patient's family is at the bedside understanding of the plan to transfer for ongoing neurologic ICU level care at [facility].  Additional verbal titration of sedation communicated by we me with the nurse including slight up titration of propofol and additional Versed 2 mg bolus.  I participated in the review of her CT angiogram, titration of sedation, reassessments, discussion with family  CRITICAL CARE Performed by: Sharyn Creamer   Total critical care time: 20 minutes  Critical care time was exclusive of separately billable procedures and treating other patients.  Critical care was necessary to treat or prevent imminent or life-threatening deterioration.  Critical care was time spent personally by me on the following activities: development of treatment plan with patient and/or surrogate as well as nursing, discussions with consultants, evaluation of patient's response to treatment, examination of patient, obtaining history from patient or surrogate, ordering and performing treatments and interventions, ordering and review of laboratory studies, ordering and review of radiographic studies, pulse oximetry and re-evaluation of patient's condition.    Sharyn Creamer, MD 06/24/22 [phone]

## 2022-06-24 NOTE — ED Notes (Signed)
2 mg bolus of Versed given from bag. Verbal order given by Sharyn Creamer, MD.

## 2022-06-24 NOTE — Progress Notes (Signed)
   06/24/22 1500  Clinical Encounter Type  Visited With Patient and family together  Visit Type Initial  Referral From Nurse  Consult/Referral To Chaplain   Chaplain responded to code stroke. Patient in scans but spoke with family about situation. Chaplain provided compassionate presence and reflective listening as family shared concerns.

## 2022-06-24 NOTE — H&P (Signed)
   NAME:  [name], MRN:  [id number], DOB:  January 03, 1971, LOS: 0 ADMISSION DATE:  06/24/2022, CONSULTATION DATE:  06/24/2022 REFERRING MD:  Selina Cooley - Neurology, CHIEF COMPLAINT:  Status epilepticus   History of Present Illness:  51 year old woman who presented with stroke-like symptoms of left sided weakness and dysarthria. Then developed generalized tonic-clonic seizures requiring intubation and sedative infusion.  Stroke work-up was negative.   She had a history of seizures on Tegretol.  Pertinent  Medical History   Past Medical History:  Diagnosis Date   Brain aneurysm    Common migraine with intractable migraine 03/03/2017   Headache    Hemiplegic migraine 10/07/2015   Late effects of cerebral ischemic stroke 02/09/2018   Left hemiparesis   Primary hypertension 12/06/2020   Seizures (HCC)     Significant Hospital Events: Including procedures, antibiotic start and stop dates in addition to other pertinent events   8/30 - intubated for status epilepticus at [facility]  Interim History / Subjective:  No visible seizures on arrival at [facility]  Objective   Blood pressure 132/80, pulse 94, temperature 99 F (37.2 C), resp. rate 20, height 5\' 1"  (1.549 m), weight 101.2 kg, SpO2 100 %.    Vent Mode: PRVC FiO2 (%):  [40 %-50 %] 40 % Set Rate:  [18 bmp] 18 bmp Vt Set:  [450 mL] 450 mL PEEP:  [5 cmH20] 5 cmH20 Plateau Pressure:  [13 cmH20] 13 cmH20  No intake or output data in the 24 hours ending 06/24/22 1825 Filed Weights   06/24/22 1800  Weight: 101.2 kg    Examination: General: appears stated age. HENT: orally intubated.  Lungs: chest clear Cardiovascular: HS normal, extremities normal  Abdomen: soft Extremities: no edema, no signs of trauma. Neuro: sedated, pupils reactive. No response to pain.  GU: Foley with clear urine  Ancillary tests personally reviewed:  ABG and electrolytes normal  Mild leukocytosis  Assessment & Plan:  Critically ill due to status epilepticus  requiring titration of sedative infusion, and intubation and mechanical ventilation for airway protection. - cEEG to guide sedation management, titrate to burst suppression - Maintain seizure control for minimum of 12h prior to weaning. - Continue home tegretol, and add Keppra - Full ventilatory support.   Best Practice (right click and "Reselect all SmartList Selections" daily)   Diet/type: NPO DVT prophylaxis: prophylactic heparin  GI prophylaxis: PPI Lines: N/A Foley:  Yes, and it is still needed Code Status:  full code Last date of multidisciplinary goals of care discussion [Family updated at bedside.]  CRITICAL CARE Performed by: 06/26/22   Total critical care time: 40 minutes  Critical care time was exclusive of separately billable procedures and treating other patients.  Critical care was necessary to treat or prevent imminent or life-threatening deterioration.  Critical care was time spent personally by me on the following activities: development of treatment plan with patient and/or surrogate as well as nursing, discussions with consultants, evaluation of patient's response to treatment, examination of patient, obtaining history from patient or surrogate, ordering and performing treatments and interventions, ordering and review of laboratory studies, ordering and review of radiographic studies, pulse oximetry, re-evaluation of patient's condition and participation in multidisciplinary rounds.  Lynnell Catalan, MD [facility] ICU Physician [facility] [facility]  Pager: [phone] Mobile: [phone] After hours: [phone].

## 2022-06-24 NOTE — Progress Notes (Signed)
Pt noted to have an order for EEG. This RN called to EEG and left a message at 2030.

## 2022-06-24 NOTE — Consult Note (Signed)
NEUROLOGY CONSULTATION NOTE   Date of service: June 24, 2022 Patient Name: [name] MRN:  [id number] DOB:  Jun 09, 1971 Reason for consult: stroke code Requesting physician: "Dr. Sharman Cheek" _ _ _   _ __   _ __ _ _  __ __   _ __   __ _  History of Present Illness   This is a 51 year old woman with a history of right parieto-occipital ICH in 2009 of unknown etiology with subsequent seizures, migraine, hypertension who presented as a stroke code with left-sided weakness. LKW 1100. She called her daughter from work and said she didn't feel well. She was later found by coworkers confused, slurring her speech, with L sided weakness. She had no observed seizure activity at that time. She is not on anticoagulation. Noncon head CT showed NAICP (personal review). NIHSS = 10. She was about to undergo CTA when she started have focal seizure activity with AMS and rhythmic jerking of L side. This aborted after 4mg  ativan. She was able to answer questions and move her LUE better but then started to seize again. We administered 4mg  ativan which again aborted the seizure and she was receiving 4.5g keppra. She subsequently started seizing again and was becoming unable to protect her airway so she was intubated and started on propofol and versed gtt. Her 2 daughters were updated at bedside that she would be transferred to [facility] neuro ICU for cEEG and further mgmt.   Patient is prescribed tegretol and zonisamide as an outpatient. She told me earlier that she was not taking them but it's unclear if she was altered when she said this. I asked her if this felt like her typical seizures and she said yes. She has presented with focal L sided seizures with AMS w/ post-ictal Todd's multiple times, most recently in 2022. She has never required intubation for status before.  She has a chart dx of brain aneurysm but I cannot find any evidence of this, 4 vessel catheter angiogram after ICH in 2009 showed no e/o aneurysm  or AVM.  ROS   UTA 2/2 AMS  Past History   I have reviewed the following:  Past Medical History:  Diagnosis Date   Brain aneurysm    Common migraine with intractable migraine 03/03/2017   Headache    Hemiplegic migraine 10/07/2015   Late effects of cerebral ischemic stroke 02/09/2018   Left hemiparesis   Primary hypertension 12/06/2020   Seizures (HCC)    Past Surgical History:  Procedure Laterality Date   ABDOMINAL HYSTERECTOMY     Family History  Problem Relation Age of Onset   Stroke Mother    Hypertension Father    Diabetes Father    Migraines Sister    Migraines Brother    Migraines Sister    Migraines Sister    Migraines Sister    Migraines Sister    Migraines Sister    Hypertension Paternal Aunt    Diabetes Paternal Aunt    Stroke Paternal Aunt    Hypertension Paternal Uncle    Diabetes Paternal Uncle    Stroke Paternal Uncle    Hypertension Maternal Grandmother    Aneurysm Paternal Grandmother    Diabetes Paternal Grandmother    Hypertension Paternal Grandfather    Cancer Paternal Grandfather    Seizures Cousin    Social History   Socioeconomic History   Marital status: Married    Spouse name: Not on file   Number of children: 3   Years of  education: 11   Highest education level: Not on file  Occupational History   Occupation: Designer, television/film set  Tobacco Use   Smoking status: Every Day    Packs/day: 0.50    Types: Cigarettes   Smokeless tobacco: Never  Substance and Sexual Activity   Alcohol use: Yes    Alcohol/week: 0.0 standard drinks of alcohol    Comment: occasionally   Drug use: No   Sexual activity: Not on file  Other Topics Concern   Not on file  Social History Narrative   Patient drink about 2 cups of caffeine daily.   Patient is right handed.    Social Determinants of Health   Financial Resource Strain: Not on file  Food Insecurity: Not on file  Transportation Needs: Not on file  Physical Activity: Not on file  Stress:  Not on file  Social Connections: Not on file   Allergies  Allergen Reactions   Amoxicillin Anaphylaxis   Morphine And Related Anaphylaxis   Penicillins Anaphylaxis    Has patient had a PCN reaction causing immediate rash, facial/tongue/throat swelling, SOB or lightheadedness with hypotension: Yes Has patient had a PCN reaction causing severe rash involving mucus membranes or skin necrosis: Yes Has patient had a PCN reaction that required hospitalization Yes Has patient had a PCN reaction occurring within the last 10 years: Yes If all of the above answers are "NO", then may proceed with Cephalosporin use.    Rocephin [Ceftriaxone Sodium In Dextrose] Anaphylaxis   Shellfish Allergy Anaphylaxis   Tylenol [Acetaminophen] Anaphylaxis   Topamax [Topiramate] Nausea And Vomiting   Tramadol     Medications   (Not in a hospital admission)     Current Facility-Administered Medications:    levETIRAcetam (KEPPRA) IVPB 1000 mg/100 mL premix, 1,000 mg, Intravenous, Once **FOLLOWED BY** [COMPLETED] levETIRAcetam (KEPPRA) IVPB 1000 mg/100 mL premix, 1,000 mg, Intravenous, Once, Stopped at 06/24/22 1351 **FOLLOWED BY** [COMPLETED] levETIRAcetam (KEPPRA) IVPB 1000 mg/100 mL premix, 1,000 mg, Intravenous, Once, Stopped at 06/24/22 1404 **FOLLOWED BY** levETIRAcetam (KEPPRA) IVPB 500 mg/100 mL premix, 500 mg, Intravenous, Once, Drusilla Kanner, RPH, Last Rate: 400 mL/hr at 06/24/22 1405, 500 mg at 06/24/22 1405   levETIRAcetam (KEPPRA) IVPB 1000 mg/100 mL premix, 1,000 mg, Intravenous, Once, Jefferson Fuel, MD   LORazepam (ATIVAN) 2 MG/ML injection, , , ,    midazolam (VERSED) 100 mg/100 mL (1 mg/mL) premix infusion, 4 mg/hr, Intravenous, Continuous, Sharman Cheek, MD, Last Rate: 4 mL/hr at 06/24/22 1403, 4 mg/hr at 06/24/22 1403   propofol (DIPRIVAN) 1000 MG/100ML infusion, 5-80 mcg/kg/min, Intravenous, Continuous, Sharman Cheek, MD, Last Rate: 6.05 mL/hr at 06/24/22 1355, 10 mcg/kg/min at  06/24/22 1355  Current Outpatient Medications:    albuterol (PROVENTIL HFA;VENTOLIN HFA) 108 (90 Base) MCG/ACT inhaler, Inhale 2 puffs into the lungs every 6 (six) hours as needed for wheezing or shortness of breath., Disp: 1 Inhaler, Rfl: 0   carbamazepine (TEGRETOL) 200 MG tablet, 1/2 tablet twice a day for 2 weeks, then take 1 tablet twice a day, Disp: 60 tablet, Rfl: 3   cyclobenzaprine (FEXMID) 7.5 MG tablet, Take 1 tablet (7.5 mg total) by mouth 3 (three) times daily as needed for muscle spasms., Disp: 30 tablet, Rfl: 0   cyclobenzaprine (FLEXERIL) 10 MG tablet, Take 1 tablet (10 mg total) by mouth 3 (three) times daily as needed for muscle spasms., Disp: 90 tablet, Rfl: 1   Fremanezumab-vfrm (AJOVY) 225 MG/1.5ML SOSY, Inject 225 mg into the muscle every 30 (thirty) days., Disp: 1.5 mL,  Rfl: 5   lisinopril-hydrochlorothiazide (ZESTORETIC) 20-12.5 MG tablet, Take 1 tablet by mouth daily., Disp: 90 tablet, Rfl: 3   zonisamide (ZONEGRAN) 100 MG capsule, One capsule in the morning and 2 at night, Disp: 270 capsule, Rfl: 3  Vitals   Vitals:   06/24/22 1300 06/24/22 1338 06/24/22 1346 06/24/22 1358  BP:   (!) 120/94   Pulse:   80   Resp:   18   Temp:   98.5 F (36.9 C)   TempSrc:   Oral   SpO2:   98% 100%  Weight: 100.8 kg 100.8 kg       Body mass index is 41.99 kg/m.  Physical Exam   Initial exam on arrival  Physical Exam Gen: drowsy, oriented x3 but easily confused in conversation. Follows simple commands only HEENT: Atraumatic, normocephalic;mucous membranes moist; oropharynx clear, tongue without atrophy or fasciculations. Neck: Supple, trachea midline. Resp: CTAB, no w/r/r CV: RRR, no m/g/r; nml S1 and S2. 2+ symmetric peripheral pulses. Abd: soft/NT/ND; nabs x 4 quad Extrem: Nml bulk; no cyanosis, clubbing, or edema.  Neuro: *MS: drowsy, oriented x3 but easily confused in conversation. Follows simple commands only *Speech: moderate dysarthria, mild aphasia *CN:  PERRL, blinks to threat on R but not L, optic discs unable to be visualized 2/2 pupillary constriction, EOMI, sensation intact, L UMN facial droop, hearing intact to voice *Motor:   Normal bulk.  No tremor, rigidity or bradykinesia. RUE and RLE full strength. LUE drift to bed, LLE no movement against gravity *Sensory: L sensory deficit, L visual neglect *Coordination:  FNF UTA 2/2 AMS *Reflexes:  2+ more brisk on L throughout without clonus; toes down-going bilat *Gait: deferred  NIHSS  1a Level of Conscious.: 1 1b LOC Questions: 0 1c LOC Commands: 0 2 Best Gaze: 0 3 Visual: 0 4 Facial Palsy: 2 5a Motor Arm - left: 2 5b Motor Arm - Right: 0 6a Motor Leg - Left: 3 6b Motor Leg - Right: 0 7 Limb Ataxia: 0 8 Sensory: 1 9 Best Language: 0 10 Dysarthria: 1 11 Extinct. and Inatten.: 1  TOTAL: 11   Premorbid mRS = 1   Labs   CBC:  Recent Labs  Lab 06/24/22 1300  WBC 15.9*  HGB 12.9  HCT 40.3  MCV 81.1  PLT 366    Basic Metabolic Panel:  Lab Results  Component Value Date   NA 144 06/24/2022   K 3.4 (L) 06/24/2022   CO2 22 06/24/2022   GLUCOSE 117 (H) 06/24/2022   BUN 11 06/24/2022   CREATININE 0.67 06/24/2022   CALCIUM 9.1 06/24/2022   GFRNONAA >60 06/24/2022   GFRAA >60 06/02/2020   Lipid Panel: No results found for: "LDLCALC" HgbA1c:  Lab Results  Component Value Date   HGBA1C 6.2 12/06/2020   Urine Drug Screen:     Component Value Date/Time   LABOPIA NONE DETECTED 09/02/2016 1110   LABOPIA NONE DETECTED 03/18/2008 1923   COCAINSCRNUR NONE DETECTED 09/02/2016 1110   LABBENZ NONE DETECTED 09/02/2016 1110   LABBENZ NONE DETECTED 03/18/2008 1923   AMPHETMU NONE DETECTED 09/02/2016 1110   AMPHETMU NONE DETECTED 03/18/2008 1923   THCU NONE DETECTED 09/02/2016 1110   THCU NONE DETECTED 03/18/2008 1923   LABBARB NONE DETECTED 09/02/2016 1110   LABBARB  03/18/2008 1923    NONE DETECTED        DRUG SCREEN FOR MEDICAL PURPOSES ONLY.  IF CONFIRMATION IS  NEEDED FOR ANY PURPOSE, NOTIFY LAB WITHIN 5 DAYS.  Alcohol Level     Component Value Date/Time   ETH <10 06/24/2022 1300     Impression   51 year old woman with a history of right parieto-occipital ICH in 2009 of unknown etiology with subsequent seizures, migraine, hypertension presented with L sided weakness and subsequent L sided focal status epilepticus with secondary generalization requiring intubation and sedation with propofol and versed. Noncon head CT showed NAICP (personal review). Her initial focal seizures were her typical breakthrough seizures in the past although she has never required intubation for status before. Prescribed carbamazepine and zonisamide as o/p, unkown if compliant. WBC 15,8, could be reactive or represent infection.  Recommendations   - Transfer to [facility] neuro ICU under CCM - Notify [facility] neurohospitalist when patient arrives to [facility] - S/p 4.5g keppra load, continue 1500mg  q 12 hrs - Patient is currently intubated, on propofol and versed - STAT EEG on arrival to [facility] to be f/b cEEG. Consider MR compatible leads given patient has not had to be intubated for breakthrough seizures in the past - Carbamazepine and zonisamide levels sent (misc [facility] send out tests drawn 8/30) - UA, Ucx, UDS - Further recs per [facility] neurohospitalist - 2 daughters updated at bedside  D/w [facility] and Dr. Ritta Slot  This patient is critically ill and at significant risk of neurological worsening, death and care requires constant monitoring of vital signs, hemodynamics,respiratory and cardiac monitoring, neurological assessment, discussion with family, other specialists and medical decision making of high complexity. I spent 110 minutes of neurocritical care time  in the care of  this patient. This was time spent independent of any time provided by nurse practitioner or PA.  Sharman Cheek, MD [facility] [phone]  If 7pm- 7am, please page  neurology on call as listed in AMION.

## 2022-06-24 NOTE — ED Notes (Addendum)
Pt arrives VIA EMS per EMS pt took b/p meds after she had headache this morning, approx 11 am pt felt left sided facial tingle and weakness to her left leg. EMS called,arrived in ER alert NIH completed. Neuro MD and Stroke RN present pt taken directly to CT scan. 18 G placed right AC. Pt noted to have movement, loss of awareness. MD Stacks states seizure. Total of 8 MG Ativan given 4mg  then another 4 mg per MD Stacks at bedside with pt. Pt monitored. Transferred back to stretcher taken to room. MD at bedside to intubate. Family agreeable with plan. Pt intubated safely, OG / Temp foley placed. Temp noted to be low. [address] initiated.   Pt resting comfortably intubated. Meds given per MD, IV pump settings dual verified with Lawyer RN. Family visiting with pt, this RN remains at bedside.

## 2022-06-25 ENCOUNTER — Inpatient Hospital Stay (HOSPITAL_COMMUNITY): Payer: Commercial Managed Care - PPO

## 2022-06-25 DIAGNOSIS — D72829 Elevated white blood cell count, unspecified: Secondary | ICD-10-CM

## 2022-06-25 DIAGNOSIS — J9600 Acute respiratory failure, unspecified whether with hypoxia or hypercapnia: Secondary | ICD-10-CM

## 2022-06-25 DIAGNOSIS — G40901 Epilepsy, unspecified, not intractable, with status epilepticus: Secondary | ICD-10-CM | POA: Diagnosis not present

## 2022-06-25 LAB — HEMOGLOBIN A1C
Hgb A1c MFr Bld: 6.5 % — ABNORMAL HIGH (ref 4.8–5.6)
Mean Plasma Glucose: 139.85 mg/dL

## 2022-06-25 LAB — GLUCOSE, CAPILLARY
Glucose-Capillary: 105 mg/dL — ABNORMAL HIGH (ref 70–99)
Glucose-Capillary: 93 mg/dL (ref 70–99)
Glucose-Capillary: 95 mg/dL (ref 70–99)
Glucose-Capillary: 121 mg/dL — ABNORMAL HIGH (ref 70–99)
Glucose-Capillary: 107 mg/dL — ABNORMAL HIGH (ref 70–99)

## 2022-06-25 LAB — URINE CULTURE: Culture: NO GROWTH

## 2022-06-25 LAB — CBC
HCT: 39.1 % (ref 36.0–46.0)
Hemoglobin: 12.5 g/dL (ref 12.0–15.0)
MCH: 25.8 pg — ABNORMAL LOW (ref 26.0–34.0)
MCHC: 32 g/dL (ref 30.0–36.0)
MCV: 80.8 fL (ref 80.0–100.0)
Platelets: 328 10*3/uL (ref 150–400)
RBC: 4.84 MIL/uL (ref 3.87–5.11)
RDW: 15 % (ref 11.5–15.5)
WBC: 17.3 10*3/uL — ABNORMAL HIGH (ref 4.0–10.5)
nRBC: 0 % (ref 0.0–0.2)

## 2022-06-25 LAB — BASIC METABOLIC PANEL
Anion gap: 9 (ref 5–15)
BUN: 8 mg/dL (ref 6–20)
CO2: 26 mmol/L (ref 22–32)
Calcium: 9.3 mg/dL (ref 8.9–10.3)
Chloride: 109 mmol/L (ref 98–111)
Creatinine, Ser: 0.56 mg/dL (ref 0.44–1.00)
GFR, Estimated: >60 mL/min (ref >60)
Glucose, Bld: 105 mg/dL — ABNORMAL HIGH (ref 70–99)
Potassium: 3.5 mmol/L (ref 3.5–5.1)
Sodium: 144 mmol/L (ref 135–145)

## 2022-06-25 LAB — PHOSPHORUS: Phosphorus: 3.7 mg/dL (ref 2.5–4.6)

## 2022-06-25 LAB — MAGNESIUM: Magnesium: 2.1 mg/dL (ref 1.7–2.4)

## 2022-06-25 LAB — TRIGLYCERIDES: Triglycerides: 171 mg/dL — ABNORMAL HIGH (ref <150)

## 2022-06-25 MED ORDER — ZONISAMIDE 100 MG PO CAPS
200.0000 mg | ORAL_CAPSULE | Freq: Every day | ORAL | Status: DC
  Administered 2022-06-25 – 2022-06-29 (×5): 200 mg via ORAL
  Filled 2022-06-25 (×6): qty 2

## 2022-06-25 MED ORDER — DOCUSATE SODIUM 50 MG/5ML PO LIQD: 100.0000 mg | Freq: Two times a day (BID) | ORAL | Status: DC

## 2022-06-25 MED ORDER — POLYETHYLENE GLYCOL 3350 17 G PO PACK: 17.0000 g | PACK | Freq: Every day | ORAL | Status: DC

## 2022-06-25 MED ORDER — CARBAMAZEPINE 200 MG PO TABS
200.0000 mg | ORAL_TABLET | Freq: Two times a day (BID) | ORAL | Status: DC
  Administered 2022-06-25: 200 mg via ORAL
  Filled 2022-06-25 (×2): qty 1

## 2022-06-25 MED ORDER — PANTOPRAZOLE 2 MG/ML SUSPENSION
40.0000 mg | Freq: Every day | ORAL | Status: DC
  Administered 2022-06-25: 40 mg via ORAL
  Filled 2022-06-25: qty 20

## 2022-06-25 MED ORDER — SENNOSIDES-DOCUSATE SODIUM 8.6-50 MG PO TABS
1.0000 | ORAL_TABLET | Freq: Every evening | ORAL | Status: DC | PRN
  Administered 2022-06-29: 1
  Filled 2022-06-25: qty 1

## 2022-06-25 MED ORDER — CHLORHEXIDINE GLUCONATE CLOTH 2 % EX PADS
6.0000 | MEDICATED_PAD | Freq: Every day | CUTANEOUS | Status: DC
Start: 2022-06-25 — End: 2022-06-26
  Administered 2022-06-25: 6 via TOPICAL

## 2022-06-25 MED ORDER — CARBAMAZEPINE 200 MG PO TABS: 200.0000 mg | ORAL_TABLET | Freq: Two times a day (BID) | ORAL | Status: DC

## 2022-06-25 MED ORDER — SORBITOL 70 % SOLN: 30.0000 mL | Freq: Every day | Status: DC | PRN

## 2022-06-25 MED ORDER — ZONISAMIDE 100 MG PO CAPS
100.0000 mg | ORAL_CAPSULE | ORAL | Status: DC
  Administered 2022-06-25 – 2022-06-30 (×6): 100 mg via ORAL
  Filled 2022-06-25 (×7): qty 1

## 2022-06-25 MED ORDER — FOLIC ACID 1 MG PO TABS
1.0000 mg | ORAL_TABLET | Freq: Every day | ORAL | Status: DC
  Administered 2022-06-26 – 2022-06-30 (×5): 1 mg via ORAL
  Filled 2022-06-25 (×5): qty 1

## 2022-06-25 MED ORDER — ADULT MULTIVITAMIN W/MINERALS CH
1.0000 | ORAL_TABLET | Freq: Every day | ORAL | Status: DC
  Administered 2022-06-26 – 2022-06-30 (×5): 1
  Filled 2022-06-25 (×5): qty 1

## 2022-06-25 MED ORDER — CARBAMAZEPINE 200 MG PO TABS
200.0000 mg | ORAL_TABLET | Freq: Two times a day (BID) | ORAL | Status: DC
  Filled 2022-06-25: qty 1

## 2022-06-25 NOTE — Plan of Care (Signed)
°  Problem: Education: °Goal: Expressions of having a comfortable level of knowledge regarding the disease process will increase °Outcome: Progressing °  °Problem: Coping: °Goal: Ability to adjust to condition or change in health will improve °Outcome: Progressing °  °Problem: Health Behavior/Discharge Planning: °Goal: Compliance with prescribed medication regimen will improve °Outcome: Progressing °  °Problem: Medication: °Goal: Risk for medication side effects will decrease °Outcome: Progressing °  °Problem: Clinical Measurements: °Goal: Complications related to the disease process, condition or treatment will be avoided or minimized °Outcome: Progressing °  °Problem: Safety: °Goal: Verbalization of understanding the information provided will improve °Outcome: Progressing °  °Problem: Self-Concept: °Goal: Level of anxiety will decrease °Outcome: Progressing °  °

## 2022-06-25 NOTE — Progress Notes (Signed)
Subjective: Husband and daughter at bedside.  Per husband, patient weaned herself off her seizure medications and has not seen neurologist in a while  ROS: Unable to obtain due to poor mental status  Examination  Vital signs in last 24 hours: Temp:  [95.2 F (35.1 C)-99 F (37.2 C)] 98.4 F (36.9 C) (08/31 0700) Pulse Rate:  [79-98] 97 (08/31 0742) Resp:  [13-22] 18 (08/31 0742) BP: (112-176)/(68-104) 158/77 (08/31 0700) SpO2:  [94 %-100 %] 100 % (08/31 0742) FiO2 (%):  [40 %-50 %] 40 % (08/31 0742) Weight:  [100 kg-101.2 kg] 101.2 kg (08/30 1800)  General: lying in bed, NAD RS: Intubated Neuro: open eyes to tactile stimulation will squeeze hand to commands but doesn't follow other commands, PERLA, No gaze deviation, withdraws to noxious stimuli in all extremities with antigravity strength      Basic Metabolic Panel: Recent Labs  Lab 06/24/22 1300 06/24/22 1906 06/25/22 0603  NA 144  --  144  K 3.4*  --  3.5  CL 110  --  109  CO2 22  --  26  GLUCOSE 117*  --  105*  BUN 11  --  8  CREATININE 0.67 0.69 0.56  CALCIUM 9.1  --  9.3  MG  --   --  2.1  PHOS  --   --  3.7    CBC: Recent Labs  Lab 06/24/22 1300 06/24/22 1906 06/25/22 0603  WBC 15.9* 16.3* 17.3*  NEUTROABS 8.4*  --   --   HGB 12.9 13.1 12.5  HCT 40.3 40.8 39.1  MCV 81.1 81.1 80.8  PLT 366 277 328     Coagulation Studies: Recent Labs    06/24/22 1300  LABPROT 13.2  INR 1.0    Imaging CTH wo contrast 06/24/2022: Negative CT head. ASPECTS is 10 CTA head and neck w and wo contras w CT perfusion 06/24/2022: Patent vasculature of the head and neck with no hemodynamically significant stenosis, occlusion, or dissection. No infarct core or penumbra identified on CT perfusion.  Mucoid impaction in some right upper lobe airways without postobstructive atelectasis.  ASSESSMENT AND PLAN: 51 year old woman with a history of right parieto-occipital ICH in 2009 of unknown etiology with subsequent seizures,  migraine, hypertension presented with L sided weakness and subsequent L sided focal status epilepticus with secondary generalization requiring intubation and sedation with propofol and versed.   Focal convulsive status epilepticus, resolved Epilepsy with breakthrough seizure Acute encephalopathy - No seizures overnight - Seizure likely due to medication non-compliance  Recommendations - Stop versed, wean propofol at 63mcg/hr to stop - Continue CBZ 200mg  BID, ZNS 100mg  QAM and 200mg  QHS - Continue EEG overnight, can dc tomorrow if no seizure overnight - Continue seizure precautions - PRN IV ativan 2mg  for clinical seizure  - Discussed plan with family at bedside, ICU team  CRITICAL CARE Performed by:   Total critical care time: 38 minutes  Critical care time was exclusive of separately billable procedures and treating other patients.  Critical care was necessary to treat or prevent imminent or life-threatening deterioration.  Critical care was time spent personally by me on the following activities: development of treatment plan with patient and/or surrogate as well as nursing, discussions with consultants, evaluation of patient's response to treatment, examination of patient, obtaining history from patient or surrogate, ordering and performing treatments and interventions, ordering and review of laboratory studies, ordering and review of radiographic studies, pulse oximetry and re-evaluation of patient's condition.  Zeb Comfort Epilepsy [facility] For questions after 5pm please refer to AMION to reach the Neurologist on call

## 2022-06-25 NOTE — Procedures (Signed)
Patient Name: [name]  MRN: [id number]  Epilepsy Attending: Charlsie Quest  Referring Physician/Provider: Rejeana Brock, MD  Duration: 06/25/2022 0310 to 1583  Patient history: 51 year old female getting rapid EEG due to concern for seizures.  Level of alertness: comatose  AEDs during EEG study: LEV, versed, propofol  Technical aspects: This EEG was obtained using a 10 lead EEG system positioned circumferentially without any parasagittal coverage (rapid EEG). Computer selected EEG is reviewed as  well as background features and all clinically significant events.  Description: EEG showed continuous generalized 2 to 3 Hz delta slowing with overriding excessive amount of 15 to 18 Hz beta activity distributed symmetrically and diffusely.   Hyperventilation and photic stimulation were not performed.     ABNORMALITY - Continuous slow, generalized - Excessive beta, generalized  IMPRESSION: This rapid ceribell EEG is suggestive of severe diffuse encephalopathy, nonspecific etiology but likely related to sedation. No seizures or epileptiform discharges were seen throughout the recording.  If suspicion for interictal activity remains a concern, a conventional EEG can be considered.   Kohei Antonellis Annabelle Harman

## 2022-06-25 NOTE — Progress Notes (Addendum)
NAME:  [name], MRN:  [id number], DOB:  02/28/1971, LOS: 1 ADMISSION DATE:  06/24/2022, CONSULTATION DATE:  06/24/2022 REFERRING MD:  Selina Cooley - Neurology, CHIEF COMPLAINT:  Status epilepticus   History of Present Illness:  51 year old woman who presented with stroke-like symptoms of left sided weakness and dysarthria. Then developed generalized tonic-clonic seizures requiring intubation and sedative infusion.  Stroke work-up was negative.   She had a history of seizures on Tegretol.  Pertinent  Medical History   Past Medical History:  Diagnosis Date   Brain aneurysm    Common migraine with intractable migraine 03/03/2017   Headache    Hemiplegic migraine 10/07/2015   Late effects of cerebral ischemic stroke 02/09/2018   Left hemiparesis   Primary hypertension 12/06/2020   Seizures (HCC)     Significant Hospital Events: Including procedures, antibiotic start and stop dates in addition to other pertinent events   8/30 - intubated for status epilepticus at [facility]. Transferred to [facility]  8/31 no further sz. Wean sedation   Interim History / Subjective:    No sz overnight   Objective   Blood pressure (!) 152/82, pulse 93, temperature 98.6 F (37 C), temperature source Bladder, resp. rate 19, height 5\' 1"  (1.549 m), weight 101.2 kg, SpO2 100 %.    Vent Mode: PRVC FiO2 (%):  [40 %-50 %] 40 % Set Rate:  [18 bmp] 18 bmp Vt Set:  [450 mL] 450 mL PEEP:  [5 cmH20] 5 cmH20 Plateau Pressure:  [13 cmH20-18 cmH20] 18 cmH20   Intake/Output Summary (Last 24 hours) at 06/25/2022 0932 Last data filed at 06/25/2022 0800 Gross per 24 hour  Intake 1536.98 ml  Output 500 ml  Net 1036.98 ml   Filed Weights   06/24/22 1800  Weight: 101.2 kg    Examination: General:  Critically ill middle aged F intubated sedated NAD. HENT:  Cerebell in place. ETT secure. Anicteric sclera  Lungs: Mechanically ventilated. Symmetrical chest expansion. Clear  Cardiovascular: rrr s1s2 cap refill brisk   Abdomen: soft ndnt + bowel sounds Extremities: no acute joint deformity Neuro: sedated. PERRLA 78mm Withdraws to pain BUE BLE. Squeezes R hand to command  GU:  foley clear yellow urine   Ancillary tests personally reviewed:  ABG and electrolytes normal  Mild leukocytosis  Assessment & Plan:   Seizure disorder Status epilepticus  -? Home med compliance vs other catalyst  P -wean continuous sedation  -cont AEDs -- on keppra and carbamazepine here (on carbamazepine and zonegran at home) - zonegran and tegretol levels pending -re-ordered home zonegran -- to start after extubation (no per tube option)   Acute respiratory failure in setting of SE.  -WUA/SBT as sedation wean  -VAP, pulm hygiene   Leukocytosis  -reactive -trend fever curve WBC     Best Practice (right click and "Reselect all SmartList Selections" daily)   Diet/type: NPO DVT prophylaxis: prophylactic heparin  GI prophylaxis: PPI Lines: N/A Foley:  Yes, and it is still needed Code Status:  full code Last date of multidisciplinary goals of care discussion [father updated at bedside 8/31]  CRITICAL CARE Performed by: 3m   Total critical care time: 42 minutes  Critical care time was exclusive of separately billable procedures and treating other patients. Critical care was necessary to treat or prevent imminent or life-threatening deterioration.  Critical care was time spent personally by me on the following activities: development of treatment plan with patient and/or surrogate as well as nursing, discussions with  consultants, evaluation of patient's response to treatment, examination of patient, obtaining history from patient or surrogate, ordering and performing treatments and interventions, ordering and review of laboratory studies, ordering and review of radiographic studies, pulse oximetry and re-evaluation of patient's condition.  Tessie Fass MSN, AGACNP-BC [facility] Pulmonary/Critical Care  Medicine Amion for pager  06/25/2022, 9:32 AM

## 2022-06-25 NOTE — Progress Notes (Signed)
eLink Physician-Brief Progress Note Patient Name: [name] DOB: 01-07-1971 MRN: [id number]   Date of Service  06/25/2022  HPI/Events of Note  Patient extubated today and has passed swallow evaluation. No longer has gastric tube and has several per tube medications ordered.   eICU Interventions  Plan: Will make patient NPO except sips with medications and ice chips. Will change per tube medications to PO.     Intervention Category Major Interventions: Other:  Lenell Antu 06/25/2022, 10:22 PM

## 2022-06-25 NOTE — Progress Notes (Signed)
Ceribell placed on pt per MD request.  

## 2022-06-25 NOTE — Progress Notes (Signed)
  Transition of Care [facility]) Screening Note   Patient Details  Name: [name] Date of Birth: 21-May-1971   Transition of Care [facility]) CM/SW Contact:    Glennon Mac, RN Phone Number: 06/25/2022, 4:55 PM    Transition of Care Department [facility]) has reviewed patient and no TOC needs have been identified at this time. We will continue to monitor patient advancement through interdisciplinary progression rounds. If new patient transition needs arise, please place a TOC consult.  Quintella Baton, RN, BSN  Trauma/Neuro ICU Case Manager [phone]

## 2022-06-25 NOTE — Procedures (Signed)
Extubation Procedure Note  Patient Details:   Name: [name] DOB: 08-23-1971 MRN: [id number]   Airway Documentation:    Vent end date: 06/25/22 Vent end time: 1356   Evaluation  O2 sats: stable throughout Complications: No apparent complications Patient did tolerate procedure well. Bilateral Breath Sounds: Clear, Diminished   Yes/  Pt extubated to 2 l/m Lasana per physician's order.  Audrie Lia 06/25/2022, 1:57 PM

## 2022-06-25 NOTE — Progress Notes (Signed)
eLink Physician-Brief Progress Note Patient Name: [name] DOB: 04-11-1971 MRN: [id number]   Date of Service  06/25/2022  HPI/Events of Note  Patient arrived from [facility] with Foley catheter. No order for Foley catheter.   eICU Interventions  Continue Foley catheter.      Intervention Category Major Interventions: Other:  Lenell Antu 06/25/2022, 12:43 AM

## 2022-06-25 NOTE — Progress Notes (Signed)
EEG LTM hook up. Non-MRI leads. [facility] notified. Test button tested.

## 2022-06-25 NOTE — Progress Notes (Signed)
Pt due EEG overnight. "D/t staffing concern", pt's routine EEG may be delayed. As a concern, this RN contacted Elink to inform MD and request a possible STAT order in order to get EEG in place this shift.

## 2022-06-26 ENCOUNTER — Encounter (HOSPITAL_COMMUNITY): Payer: Self-pay | Admitting: Pulmonary Disease

## 2022-06-26 ENCOUNTER — Other Ambulatory Visit: Payer: Self-pay

## 2022-06-26 DIAGNOSIS — G40901 Epilepsy, unspecified, not intractable, with status epilepticus: Secondary | ICD-10-CM | POA: Diagnosis not present

## 2022-06-26 LAB — BASIC METABOLIC PANEL
Anion gap: 10 (ref 5–15)
BUN: 8 mg/dL (ref 6–20)
CO2: 24 mmol/L (ref 22–32)
Calcium: 8.9 mg/dL (ref 8.9–10.3)
Chloride: 107 mmol/L (ref 98–111)
Creatinine, Ser: 0.67 mg/dL (ref 0.44–1.00)
GFR, Estimated: >60 mL/min (ref >60)
Glucose, Bld: 100 mg/dL — ABNORMAL HIGH (ref 70–99)
Potassium: 3.1 mmol/L — ABNORMAL LOW (ref 3.5–5.1)
Sodium: 141 mmol/L (ref 135–145)

## 2022-06-26 LAB — GLUCOSE, CAPILLARY
Glucose-Capillary: 106 mg/dL — ABNORMAL HIGH (ref 70–99)
Glucose-Capillary: 110 mg/dL — ABNORMAL HIGH (ref 70–99)
Glucose-Capillary: 129 mg/dL — ABNORMAL HIGH (ref 70–99)
Glucose-Capillary: 86 mg/dL (ref 70–99)

## 2022-06-26 LAB — CBC
HCT: 36.5 % (ref 36.0–46.0)
Hemoglobin: 12 g/dL (ref 12.0–15.0)
MCH: 26.4 pg (ref 26.0–34.0)
MCHC: 32.9 g/dL (ref 30.0–36.0)
MCV: 80.2 fL (ref 80.0–100.0)
Platelets: 315 10*3/uL (ref 150–400)
RBC: 4.55 MIL/uL (ref 3.87–5.11)
RDW: 14.6 % (ref 11.5–15.5)
WBC: 18.2 10*3/uL — ABNORMAL HIGH (ref 4.0–10.5)
nRBC: 0 % (ref 0.0–0.2)

## 2022-06-26 LAB — MISC LABCORP TEST (SEND OUT)
Labcorp test code: 7419
Labcorp test code: 7915

## 2022-06-26 LAB — MAGNESIUM: Magnesium: 2 mg/dL (ref 1.7–2.4)

## 2022-06-26 LAB — PHOSPHORUS: Phosphorus: 3.8 mg/dL (ref 2.5–4.6)

## 2022-06-26 MED ORDER — POTASSIUM CHLORIDE CRYS ER 20 MEQ PO TBCR
20.0000 meq | EXTENDED_RELEASE_TABLET | ORAL | Status: AC
  Administered 2022-06-26 (×2): 20 meq via ORAL
  Filled 2022-06-26 (×2): qty 1

## 2022-06-26 MED ORDER — FENTANYL CITRATE PF 50 MCG/ML IJ SOSY
25.0000 ug | PREFILLED_SYRINGE | INTRAMUSCULAR | Status: DC | PRN
  Administered 2022-06-30: 25 ug via INTRAVENOUS
  Filled 2022-06-26: qty 1

## 2022-06-26 MED ORDER — LEVETIRACETAM 500 MG PO TABS
1500.0000 mg | ORAL_TABLET | Freq: Two times a day (BID) | ORAL | Status: DC
  Administered 2022-06-26 – 2022-06-30 (×9): 1500 mg via ORAL
  Filled 2022-06-26 (×5): qty 3
  Filled 2022-06-26: qty 2
  Filled 2022-06-26 (×5): qty 3

## 2022-06-26 MED ORDER — OXYCODONE HCL 5 MG PO TABS
5.0000 mg | ORAL_TABLET | ORAL | Status: DC | PRN
  Administered 2022-06-26 – 2022-06-29 (×6): 5 mg via ORAL
  Filled 2022-06-26 (×6): qty 1

## 2022-06-26 MED ORDER — POTASSIUM CHLORIDE 10 MEQ/100ML IV SOLN
10.0000 meq | INTRAVENOUS | Status: DC
  Administered 2022-06-26 (×2): 10 meq via INTRAVENOUS
  Filled 2022-06-26 (×4): qty 100

## 2022-06-26 NOTE — Progress Notes (Signed)
[facility] ADULT ICU REPLACEMENT PROTOCOL   The patient does apply for the [facility] Adult ICU Electrolyte Replacment Protocol based on the criteria listed below:   1.Exclusion criteria: TCTS patients, ECMO patients, and Dialysis patients 2. Is GFR >/= 30 ml/min? Yes.    Patient's GFR today is >60 3. Is SCr </= 2? Yes.   Patient's SCr is 0.67 mg/dL 4. Did SCr increase >/= 0.5 in 24 hours? No. 5.Pt's weight >40kg  Yes.   6. Abnormal electrolyte(s):   K 3.1  7. Electrolytes replaced per protocol 8.  Call MD STAT for K+ </= 2.5, Phos </= 1, or Mag </= 1 Physician:  S. Bobbye Morton R Meah Jiron 06/26/2022 5:47 AM

## 2022-06-26 NOTE — Progress Notes (Signed)
Subjective: No acute events overnight.  Husband and brother at bedside.  Patient reporting mild headache.  Denies any other concerns.  ROS: negative except above  Examination  Vital signs in last 24 hours: Temp:  [98.2 F (36.8 C)-100.6 F (38.1 C)] 98.2 F (36.8 C) (09/01 0800) Pulse Rate:  [85-111] 88 (09/01 0915) Resp:  [13-24] 13 (09/01 0915) BP: (109-155)/(56-128) 121/71 (09/01 0830) SpO2:  [92 %-100 %] 97 % (09/01 0915) FiO2 (%):  [28 %-40 %] 28 % (08/31 1356)  General: lying in bed, NAD Neuro: MS: Alert, oriented, follows commands CN: pupils equal and reactive,  EOMI, face symmetric, tongue midline, normal sensation over face Motor: 5/5 strength in all 4 extremities  Basic Metabolic Panel: Recent Labs  Lab 06/24/22 1300 06/24/22 1906 06/25/22 0603 06/26/22 0410  NA 144  --  144 141  K 3.4*  --  3.5 3.1*  CL 110  --  109 107  CO2 22  --  26 24  GLUCOSE 117*  --  105* 100*  BUN 11  --  8 8  CREATININE 0.67 0.69 0.56 0.67  CALCIUM 9.1  --  9.3 8.9  MG  --   --  2.1 2.0  PHOS  --   --  3.7 3.8    CBC: Recent Labs  Lab 06/24/22 1300 06/24/22 1906 06/25/22 0603 06/26/22 0410  WBC 15.9* 16.3* 17.3* 18.2*  NEUTROABS 8.4*  --   --   --   HGB 12.9 13.1 12.5 12.0  HCT 40.3 40.8 39.1 36.5  MCV 81.1 81.1 80.8 80.2  PLT 366 277 328 315     Coagulation Studies: Recent Labs    06/24/22 1300  LABPROT 13.2  INR 1.0    Imaging No new brain imaging overnight  ASSESSMENT AND PLAN:  51 year old woman with a history of right parieto-occipital ICH in 2009 of unknown etiology with subsequent seizures, migraine, hypertension presented with L sided weakness and subsequent L sided focal status epilepticus with secondary generalization requiring intubation and sedation with propofol and versed.    Focal convulsive status epilepticus, resolved Epilepsy with breakthrough seizure Acute encephalopathy - No seizures overnight - Seizure likely due to medication  non-compliance   Recommendations - Continue Keppra 1500mg  BID, ZNS 100mg  QAM and 200mg  QHS - Patient was also on carbamazepine at home but was not taking it.  Will hold carbamazepine as we have already added Keppra which has significantly less long-term side effects -Discussed with patient and husband at bedside that if patient develops any side effects to Keppra, we can consider transitioning back to carbamazepine or oxcarbazepine (lesser side effects) -DC LTM EEG - Continue seizure precautions including do not drive -Follow-up with neurology in 3 months - PRN IV ativan 2mg  for clinical seizure  - Discussed plan with family at bedside  Seizure precautions: Per [facility] statutes, patients with seizures are not allowed to drive until they have been seizure-free for six months and cleared by a physician    Use caution when using heavy equipment or power tools. Avoid working on ladders or at heights. Take showers instead of baths. Ensure the water temperature is not too high on the home water heater. Do not go swimming alone. Do not lock yourself in a room alone (i.e. bathroom). When caring for infants or small children, sit down when holding, feeding, or changing them to minimize risk of injury to the child in the event you have a seizure. Maintain good sleep hygiene. Avoid alcohol.  If patient has another seizure, call 911 and bring them back to the ED if: A.  The seizure lasts longer than 5 minutes.      B.  The patient doesn't wake shortly after the seizure or has new problems such as difficulty seeing, speaking or moving following the seizure C.  The patient was injured during the seizure D.  The patient has a temperature over 102 F (39C) E.  The patient vomited during the seizure and now is having trouble breathing    During the Seizure   - First, ensure adequate ventilation and place patients on the floor on their left side  Loosen clothing around the neck and ensure the  airway is patent. If the patient is clenching the teeth, do not force the mouth open with any object as this can cause severe damage - Remove all items from the surrounding that can be hazardous. The patient may be oblivious to what's happening and may not even know what he or she is doing. If the patient is confused and wandering, either gently guide him/her away and block access to outside areas - Reassure the individual and be comforting - Call 911. In most cases, the seizure ends before EMS arrives. However, there are cases when seizures may last over 3 to 5 minutes. Or the individual may have developed breathing difficulties or severe injuries. If a pregnant patient or a person with diabetes develops a seizure, it is prudent to call an ambulance. - Finally, if the patient does not regain full consciousness, then call EMS. Most patients will remain confused for about 45 to 90 minutes after a seizure, so you must use judgment in calling for help.     After the Seizure (Postictal Stage)   After a seizure, most patients experience confusion, fatigue, muscle pain and/or a headache. Thus, one should permit the individual to sleep. For the next few days, reassurance is essential. Being calm and helping reorient the person is also of importance.   Most seizures are painless and end spontaneously. Seizures are not harmful to others but can lead to complications such as stress on the lungs, brain and the heart. Individuals with prior lung problems may develop labored breathing and respiratory distress.      I have spent a total of   40 minutes with the patient reviewing hospital notes,  test results, labs and examining the patient as well as establishing an assessment and plan that was discussed personally with the patient.  > 50% of time was spent in direct patient care.      Lindie Spruce Epilepsy [facility] For questions after 5pm please refer to AMION to reach the Neurologist on  call

## 2022-06-26 NOTE — Evaluation (Signed)
Physical Therapy Evaluation Patient Details Name: [name] MRN: [id number] DOB: Nov 22, 1970 Today's Date: 06/26/2022  History of Present Illness  Pt is a 51 y.o. female who presented 06/24/22 with L-sided weakness and dysarthria then developed tonic-clonic seizures requiring intubation. Stroke work-up was negative. Extubated 8/31. EEG suggestive of cortical dysfunction in right posterior quadrant likely secondary to underlying stroke as well as moderate diffuse encephalopathy. Pt has been non-compliant to seizure medications. PMH: right parieto-occipital ICH in 2009 of unknown etiology with subsequent seizures, migraine, HTN   Clinical Impression  Pt presents with condition above and deficits mentioned below, see PT Problem List. PTA, she was IND without DME, driving, and working as a Production designer, theatre/television/film at [employer]. She reports very mild residual weakness on her L side from her ICH in 2009. Currently, pt is demonstrating deficits in cognition, balance, activity tolerance, and L-sided coordination and strength. She is at high risk for falls, demonstrating L knee buckling when trying to ambulate. This resulted in her requiring up to mod-maxA to ambulate ~7 ft with a RW today. Pt also requiring modA for transfers and minA to transition sit > supine at this time. Due to her drastic decline in functional status and good family support, recommend AIR. Hopefully she will progress quickly to be able to go home though. Will continue to follow acutely.       Recommendations for follow up therapy are one component of a multi-disciplinary discharge planning process, led by the attending physician.  Recommendations may be updated based on patient status, additional functional criteria and insurance authorization.  Follow Up Recommendations Acute inpatient rehab (3hours/day)      Assistance Recommended at Discharge Frequent or constant Supervision/Assistance  Patient can return home with the following  Two  people to help with walking and/or transfers;A lot of help with bathing/dressing/bathroom;Assistance with cooking/housework;Direct supervision/assist for medications management;Direct supervision/assist for financial management;Assist for transportation    Equipment Recommendations Rolling walker (2 wheels);BSC/3in1  Recommendations for Other Services  Rehab consult;OT consult    Functional Status Assessment Patient has had a recent decline in their functional status and demonstrates the ability to make significant improvements in function in a reasonable and predictable amount of time.     Precautions / Restrictions Precautions Precautions: Fall;Other (comment) Precaution Comments: seizures Restrictions Weight Bearing Restrictions: No      Mobility  Bed Mobility Overal bed mobility: Needs Assistance Bed Mobility: Supine to Sit, Sit to Supine     Supine to sit: Min guard, HOB elevated Sit to supine: Min assist, HOB elevated   General bed mobility comments: Extra time and cues to use L UE to push up to sit L EOB as pt's R UE was swollen with blisters from some reaction (husband reported medical team is aware already). MinA to lift legs to return to supine.    Transfers Overall transfer level: Needs assistance Equipment used: 1 person hand held assist, Rolling walker (2 wheels) Transfers: Sit to/from Stand Sit to Stand: Mod assist           General transfer comment: Pt needing modA to power up to stand and gain stability from EOB, relying more heavily on her R leg to stand. Transitioned pt's hands from PT anterior to her to RW for improved stability.    Ambulation/Gait Ambulation/Gait assistance: Mod assist, Max assist Gait Distance (Feet): 7 Feet Assistive device: Rolling walker (2 wheels) Gait Pattern/deviations: Step-through pattern, Decreased stride length, Knees buckling, Decreased step length - left, Trunk flexed, Narrow  base of support Gait velocity: reduced Gait  velocity interpretation: <1.31 ft/sec, indicative of household ambulator   General Gait Details: Pt with very slow, unsteady gait, displaying a narrow BOS and intermittent L knee buckling. Pt had significant L knee buckling 1x when cuing pt to start stepping posteriorly back to bed, requiring maxA and PT's thigh at her buttocks to boost pt back up to stand and to successfully prevent fall. Otherwise, required modA for stability.  Stairs            Wheelchair Mobility    Modified Rankin (Stroke Patients Only) Modified Rankin (Stroke Patients Only) Pre-Morbid Rankin Score: No symptoms Modified Rankin: Moderately severe disability     Balance Overall balance assessment: Needs assistance Sitting-balance support: No upper extremity supported, Feet supported Sitting balance-Leahy Scale: Fair Sitting balance - Comments: Pt with noted trunk sway when lifting UEs off bed surface to reach off BOS. Min guard-minA for sitting balance.   Standing balance support: Bilateral upper extremity supported, During functional activity, Reliant on assistive device for balance Standing balance-Leahy Scale: Poor Standing balance comment: Reliant on RW and up to mod-maxA                             Pertinent Vitals/Pain Pain Assessment Pain Assessment: Faces Faces Pain Scale: Hurts little more Pain Location: R UE Pain Descriptors / Indicators: Discomfort, Grimacing, Guarding Pain Intervention(s): Limited activity within patient's tolerance, Monitored during session, Repositioned    Home Living Family/patient expects to be discharged to:: Private residence Living Arrangements: Spouse/significant other;Children (adult daughter) Available Help at Discharge: Family;Available 24 hours/day Type of Home: House Home Access: Level entry       Home Layout: One level Home Equipment: Cane - single point      Prior Function Prior Level of Function : Independent/Modified  Independent;Driving;Working/employed             Mobility Comments: No AD. Very minor residual weakness on her L from ICH in 2009. ADLs Comments: Works as Production designer, theatre/television/film at [employer].     Hand Dominance        Extremity/Trunk Assessment   Upper Extremity Assessment Upper Extremity Assessment: Defer to OT evaluation    Lower Extremity Assessment Lower Extremity Assessment: LLE deficits/detail LLE Deficits / Details: MMT scores of 3+ hip flexion, 4 knee extension, 4+ ankle dorsiflexion (grossly 4+ to 5 throughout on R); gross incoordination LLE Coordination: decreased gross motor    Cervical / Trunk Assessment Cervical / Trunk Assessment: Normal  Communication   Communication: No difficulties  Cognition Arousal/Alertness: Awake/alert Behavior During Therapy: Flat affect Overall Cognitive Status: Impaired/Different from baseline Area of Impairment: Orientation, Attention, Memory, Following commands, Awareness, Safety/judgement, Problem solving                 Orientation Level: Disoriented to, Situation (did not ask about time) Current Attention Level: Sustained Memory: Decreased short-term memory Following Commands: Follows one step commands consistently, Follows one step commands with increased time, Follows multi-step commands inconsistently Safety/Judgement: Decreased awareness of deficits, Decreased awareness of safety Awareness: Emergent Problem Solving: Slow processing, Difficulty sequencing, Requires verbal cues, Requires tactile cues General Comments: Pt stating "I don't even know why I am here". Did not assess whether she knew the time/date. Pt slow to process cues and respond at times. Pt wavered from attention on therapist to distracted patting the bed for her husband to "come here". Noted some insight into her deficits.  General Comments General comments (skin integrity, edema, etc.): husband present during session    Exercises      Assessment/Plan    PT Assessment Patient needs continued PT services  PT Problem List Decreased strength;Decreased activity tolerance;Decreased balance;Decreased mobility;Decreased coordination;Decreased cognition;Decreased knowledge of use of DME;Decreased safety awareness       PT Treatment Interventions DME instruction;Gait training;Functional mobility training;Therapeutic activities;Therapeutic exercise;Balance training;Neuromuscular re-education;Cognitive remediation;Patient/family education    PT Goals (Current goals can be found in the Care Plan section)  Acute Rehab PT Goals Patient Stated Goal: to get better PT Goal Formulation: With patient/family Time For Goal Achievement: 07/10/22 Potential to Achieve Goals: Good    Frequency Min 4X/week     Co-evaluation               AM-PAC PT "6 Clicks" Mobility  Outcome Measure Help needed turning from your back to your side while in a flat bed without using bedrails?: A Little Help needed moving from lying on your back to sitting on the side of a flat bed without using bedrails?: A Little Help needed moving to and from a bed to a chair (including a wheelchair)?: A Lot Help needed standing up from a chair using your arms (e.g., wheelchair or bedside chair)?: A Lot Help needed to walk in hospital room?: Total Help needed climbing 3-5 steps with a railing? : Total 6 Click Score: 12    End of Session Equipment Utilized During Treatment: Gait belt Activity Tolerance: Patient tolerated treatment well Patient left: in bed;with call bell/phone within reach;with bed alarm set;with family/visitor present Nurse Communication: Mobility status PT Visit Diagnosis: Unsteadiness on feet (R26.81);Other abnormalities of gait and mobility (R26.89);Difficulty in walking, not elsewhere classified (R26.2);Other symptoms and signs involving the nervous system (R29.898);Muscle weakness (generalized) (M62.81)    Time: 1610-9604 PT Time  Calculation (min) (ACUTE ONLY): 22 min   Charges:   PT Evaluation $PT Eval Moderate Complexity: 1 Mod          Raymond Gurney, PT, DPT Acute Rehabilitation Services  Office: [phone]   Jewel Baize 06/26/2022, 4:26 PM

## 2022-06-26 NOTE — Progress Notes (Signed)
? ?  Inpatient Rehab Admissions Coordinator : ? ?Per therapy recommendations, patient was screened for CIR candidacy by Dusty Raczkowski RN MSN.  At this time patient appears to be a potential candidate for CIR. I will place a rehab consult per protocol for full assessment. Please call me with any questions. ? ?Katherinne Mofield RN MSN ?Admissions Coordinator ?[phone] ?  ?

## 2022-06-26 NOTE — Progress Notes (Signed)
   NAME:  [name], MRN:  [id number], DOB:  Dec 08, 1970, LOS: 2 ADMISSION DATE:  06/24/2022, CONSULTATION DATE:  06/24/2022 REFERRING MD:  Selina Cooley - Neurology, CHIEF COMPLAINT:  Status epilepticus   History of Present Illness:  51 year old woman who presented with stroke-like symptoms of left sided weakness and dysarthria. Then developed generalized tonic-clonic seizures requiring intubation and sedative infusion.  Stroke work-up was negative.   She had a history of seizures on Tegretol.  Pertinent  Medical History   Past Medical History:  Diagnosis Date   Brain aneurysm    Common migraine with intractable migraine 03/03/2017   Headache    Hemiplegic migraine 10/07/2015   Late effects of cerebral ischemic stroke 02/09/2018   Left hemiparesis   Primary hypertension 12/06/2020   Seizures (HCC)     Significant Hospital Events: Including procedures, antibiotic start and stop dates in addition to other pertinent events   8/30 - intubated for status epilepticus at [facility]. Transferred to [facility]  8/31 no further sz. Wean sedation   Interim History / Subjective:   Extubated yesterday. Complains of painful swelling from all IV sites on both arms.    Objective   Blood pressure 121/71, pulse 88, temperature 98.2 F (36.8 C), temperature source Axillary, resp. rate 13, height 5\' 1"  (1.549 m), weight 101.2 kg, SpO2 97 %.    Vent Mode: PSV;CPAP FiO2 (%):  [28 %-40 %] 28 % Pressure Support:  [8 cmH20] 8 cmH20   Intake/Output Summary (Last 24 hours) at 06/26/2022 1053 Last data filed at 06/26/2022 0900 Gross per 24 hour  Intake 1959.49 ml  Output 2040 ml  Net -80.51 ml    Filed Weights   06/24/22 1800  Weight: 101.2 kg    Examination: General:  appears stated age and in pain.  HENT:  EEG leads in place, Lungs: chest clear with no distress Cardiovascular: rrr s1s2 cap refill brisk  Abdomen: soft ndnt + bowel sounds Extremities: swelling erythema and blistering at all IV sites   Neuro:   awake with no focal deficits.  GU:  foley clear yellow urine   Ancillary tests personally reviewed:  ABG and electrolytes normal  Mild leukocytosis  Assessment & Plan:   Physicochemical phlebitis secondary to peripheral Ivs -Remove all IVs.  Warm compresses -If does not improve with conservative management consider Doppler to rule out venous thrombosis.  Was critically ill due to status epilepticus now off infusions Seizure disorder with possible non-adherence to prescribed therapy. -Continue home Tegretol and Zonegran -Continue levetiracetam started in hospital. -Follow-up with Dr. 06/26/22 at [facility] neurology following discharge.  Acute respiratory failure in setting of SE now extubated. -Progressive ambulation.  Leukocytosis  -May be reactive to phlebitis. -trend fever curve WBC   Best Practice (right click and "Reselect all SmartList Selections" daily)   Diet/type: Regular consistency (see orders) DVT prophylaxis: prophylactic heparin  GI prophylaxis: PPI Lines: N/A Foley:  Yes, and it is still needed Code Status:  full code Last date of multidisciplinary goals of care discussion [updated bedside 9/1]  Ready for transfer: Orders reconciled and [facility] notified  [facility], MD [facility] ICU Physician [facility] [place] [facility]  Pager: [phone] Or Epic Secure Chat After hours: [phone].  06/26/2022, 12:52 PM     06/26/2022, 10:53 AM

## 2022-06-26 NOTE — Progress Notes (Signed)
LTM EEG discontinued - no skin breakdown at unhook.   

## 2022-06-26 NOTE — Procedures (Addendum)
Patient Name: [name]  MRN: [id number]  Epilepsy Attending: Charlsie Quest  Referring Physician/Provider: Rejeana Brock, MD  Duration: 06/25/2022 1032 to 06/26/2022 1000  Patient history: 51 year old female on EEG to evaluate for seizure  Level of alertness: Awake, asleep  AEDs during EEG study: LEV, ZNS  Technical aspects: This EEG study was done with scalp electrodes positioned according to the 10-20 International system of electrode placement. Electrical activity was reviewed with band pass filter of 1-70Hz , sensitivity of 7 uV/mm, display speed of 73mm/sec with a 60Hz  notched filter applied as appropriate. EEG data were recorded continuously and digitally stored.  Video monitoring was available and reviewed as appropriate.  Description: During awake state, no clear posterior dominant rhythm was seen.  EEG showed continuous generalized and maximal right posterior quadrant 3-7 Hz theta-delta slowing admixed with 12 to 14 Hz generalized beta activity.  Sleep was characterized by vertex waves, sleep spindles (12 to 14 Hz), maximal frontocentral region. Hyperventilation and photic stimulation were not performed.     ABNORMALITY - Continuous slow, generalized and maximal right posterior quadrant  IMPRESSION: This study is suggestive of cortical dysfunction in right posterior quadrant likely secondary to underlying stroke as well as moderate diffuse encephalopathy, nonspecific to etiology.  No seizures or epileptiform discharges were seen throughout the recording.  Taiz Bickle 

## 2022-06-26 NOTE — Progress Notes (Signed)
[name] has arrived to 64w08 with husband at bedside. Seizure precautions in place: padded bedrails, [place] in place, suction set up at bedside. Oriented to room, call light, and bed controls. Vital signs stable, and call light within reach, bed in lowest position.

## 2022-06-27 DIAGNOSIS — J9601 Acute respiratory failure with hypoxia: Secondary | ICD-10-CM

## 2022-06-27 DIAGNOSIS — G40901 Epilepsy, unspecified, not intractable, with status epilepticus: Secondary | ICD-10-CM | POA: Diagnosis not present

## 2022-06-27 DIAGNOSIS — D72829 Elevated white blood cell count, unspecified: Secondary | ICD-10-CM | POA: Diagnosis not present

## 2022-06-27 LAB — CBC
HCT: 37.9 % (ref 36.0–46.0)
Hemoglobin: 12 g/dL (ref 12.0–15.0)
MCH: 26 pg (ref 26.0–34.0)
MCHC: 31.7 g/dL (ref 30.0–36.0)
MCV: 82 fL (ref 80.0–100.0)
Platelets: 302 10*3/uL (ref 150–400)
RBC: 4.62 MIL/uL (ref 3.87–5.11)
RDW: 14.8 % (ref 11.5–15.5)
WBC: 13.9 10*3/uL — ABNORMAL HIGH (ref 4.0–10.5)
nRBC: 0 % (ref 0.0–0.2)

## 2022-06-27 LAB — BASIC METABOLIC PANEL
Anion gap: 8 (ref 5–15)
BUN: 9 mg/dL (ref 6–20)
CO2: 21 mmol/L — ABNORMAL LOW (ref 22–32)
Calcium: 9.1 mg/dL (ref 8.9–10.3)
Chloride: 111 mmol/L (ref 98–111)
Creatinine, Ser: 0.7 mg/dL (ref 0.44–1.00)
GFR, Estimated: >60 mL/min (ref >60)
Glucose, Bld: 95 mg/dL (ref 70–99)
Potassium: 3.4 mmol/L — ABNORMAL LOW (ref 3.5–5.1)
Sodium: 140 mmol/L (ref 135–145)

## 2022-06-27 LAB — GLUCOSE, CAPILLARY: Glucose-Capillary: 124 mg/dL — ABNORMAL HIGH (ref 70–99)

## 2022-06-27 MED ORDER — IBUPROFEN 400 MG PO TABS
400.0000 mg | ORAL_TABLET | Freq: Four times a day (QID) | ORAL | Status: DC | PRN
Start: 2022-06-27 — End: 2022-06-28

## 2022-06-27 MED ORDER — KETOROLAC TROMETHAMINE 30 MG/ML IJ SOLN
30.0000 mg | Freq: Three times a day (TID) | INTRAMUSCULAR | Status: DC | PRN
  Administered 2022-06-27 – 2022-06-28 (×2): 30 mg via INTRAVENOUS
  Filled 2022-06-27 (×4): qty 1

## 2022-06-27 MED ORDER — ONDANSETRON HCL 4 MG/2ML IJ SOLN
4.0000 mg | Freq: Four times a day (QID) | INTRAMUSCULAR | Status: DC | PRN
  Administered 2022-06-27: 4 mg via INTRAVENOUS

## 2022-06-27 MED ORDER — METOCLOPRAMIDE HCL 5 MG/ML IJ SOLN
10.0000 mg | Freq: Three times a day (TID) | INTRAMUSCULAR | Status: DC | PRN
  Administered 2022-06-27 – 2022-06-28 (×2): 10 mg via INTRAVENOUS
  Filled 2022-06-27 (×2): qty 2

## 2022-06-27 MED ORDER — DIPHENHYDRAMINE HCL 50 MG/ML IJ SOLN
12.5000 mg | Freq: Three times a day (TID) | INTRAMUSCULAR | Status: DC | PRN
  Administered 2022-06-27 – 2022-06-28 (×2): 12.5 mg via INTRAVENOUS
  Filled 2022-06-27 (×3): qty 1

## 2022-06-27 MED ORDER — POTASSIUM CHLORIDE CRYS ER 20 MEQ PO TBCR
40.0000 meq | EXTENDED_RELEASE_TABLET | Freq: Once | ORAL | Status: AC
Start: 2022-06-27 — End: 2022-06-27
  Administered 2022-06-27: 40 meq via ORAL
  Filled 2022-06-27: qty 2

## 2022-06-27 MED ORDER — ONDANSETRON HCL 4 MG/2ML IJ SOLN
INTRAMUSCULAR | Status: AC
  Filled 2022-06-27: qty 2

## 2022-06-27 MED ORDER — ONDANSETRON 4 MG PO TBDP
4.0000 mg | ORAL_TABLET | Freq: Once | ORAL | Status: AC
Start: 2022-06-27 — End: 2022-06-27

## 2022-06-27 MED ORDER — DOXYCYCLINE HYCLATE 100 MG PO TABS
100.0000 mg | ORAL_TABLET | Freq: Two times a day (BID) | ORAL | Status: DC
  Administered 2022-06-27 – 2022-06-30 (×7): 100 mg via ORAL
  Filled 2022-06-27 (×7): qty 1

## 2022-06-27 MED ORDER — ONDANSETRON 4 MG PO TBDP
ORAL_TABLET | ORAL | Status: AC
  Filled 2022-06-27: qty 1

## 2022-06-27 NOTE — Progress Notes (Signed)
Started having headaches (typical for her usual migraine flare) along with nausea/vomiting. Per patient it feels like her usual migraine headaches. Belly is soft and non distended. Will try cocktail of toradol/reglan and benadryl.

## 2022-06-27 NOTE — Progress Notes (Signed)
Physical Therapy Treatment Patient Details Name: [name] MRN: [id number] DOB: 1971-05-17 Today's Date: 06/27/2022   History of Present Illness Pt is a 51 y.o. female who presented 06/24/22 with L-sided weakness and dysarthria then developed tonic-clonic seizures requiring intubation. Stroke work-up was negative. Extubated 8/31. EEG suggestive of cortical dysfunction in right posterior quadrant likely secondary to underlying stroke as well as moderate diffuse encephalopathy. Pt has been non-compliant to seizure medications. PMH: right parieto-occipital ICH in 2009 of unknown etiology with subsequent seizures, migraine, HTN    PT Comments    The pt was able to make good progress with mobility and ambulation in the room this session. She remains limited by significant pain in LLE, LUE, and RUE at this time, but was able to make progress with less UE support for ambulation. The pt continues to need min-modA to manage balance and presents with poor DF and strength in LLE during short ambulation. Continue to recommend acute inpatient rehab when medically stable to facilitate return to prior level of independence.    Recommendations for follow up therapy are one component of a multi-disciplinary discharge planning process, led by the attending physician.  Recommendations may be updated based on patient status, additional functional criteria and insurance authorization.  Follow Up Recommendations  Acute inpatient rehab (3hours/day)     Assistance Recommended at Discharge Frequent or constant Supervision/Assistance  Patient can return home with the following Two people to help with walking and/or transfers;A lot of help with bathing/dressing/bathroom;Assistance with cooking/housework;Direct supervision/assist for medications management;Direct supervision/assist for financial management;Assist for transportation   Equipment Recommendations  Rolling walker (2 wheels);BSC/3in1    Recommendations for  Other Services Rehab consult;OT consult     Precautions / Restrictions Precautions Precautions: Fall;Other (comment) Precaution Comments: seizures Restrictions Weight Bearing Restrictions: No     Mobility  Bed Mobility Overal bed mobility: Needs Assistance Bed Mobility: Supine to Sit, Sit to Supine     Supine to sit: Min guard, HOB elevated Sit to supine: Min assist, HOB elevated   General bed mobility comments: Extra time and cues to use L UE to push up to sit L EOB as pt's R UE was swollen with blisters from some reaction (husband reported medical team is aware already). MinA to lift legs to return to supine.    Transfers Overall transfer level: Needs assistance Equipment used: 1 person hand held assist, Rolling walker (2 wheels) Transfers: Sit to/from Stand Sit to Stand: Min assist, Min guard           General transfer comment: pt able to rise to standing with minA initially but progressed to minG. only using single UE support on RW so tried with HHA or no DME in future attempts    Ambulation/Gait Ambulation/Gait assistance: Min assist Gait Distance (Feet): 15 Feet (4 + 15 ft) Assistive device: None Gait Pattern/deviations: Step-through pattern, Decreased stride length, Decreased step length - left, Trunk flexed, Narrow base of support Gait velocity: reduced Gait velocity interpretation: <1.31 ft/sec, indicative of household ambulator   General Gait Details: slow unsteady gait with inconsistent stride length and clearance. Limited DF on LLE, no instances of buckling.  Modified Rankin (Stroke Patients Only) Modified Rankin (Stroke Patients Only) Pre-Morbid Rankin Score: No symptoms Modified Rankin: Moderately severe disability     Balance Overall balance assessment: Needs assistance Sitting-balance support: No upper extremity supported, Feet supported Sitting balance-Leahy Scale: Fair Sitting balance - Comments: Pt with noted trunk sway when lifting UEs off  bed surface to  reach off BOS. Min guard-minA for sitting balance.   Standing balance support: Bilateral upper extremity supported, During functional activity, No upper extremity supported Standing balance-Leahy Scale: Fair Standing balance comment: minA without UE support, pt unable to use RW at this time due to pain in bilateral UE                            Cognition Arousal/Alertness: Awake/alert Behavior During Therapy: Flat affect Overall Cognitive Status: Impaired/Different from baseline Area of Impairment: Orientation, Attention, Memory, Following commands, Awareness, Safety/judgement, Problem solving                 Orientation Level: Disoriented to, Time, Situation (knew month and year, confused on where she was yesterday) Current Attention Level: Sustained Memory: Decreased short-term memory Following Commands: Follows one step commands consistently, Follows one step commands with increased time, Follows multi-step commands inconsistently Safety/Judgement: Decreased awareness of deficits, Decreased awareness of safety Awareness: Emergent Problem Solving: Slow processing, Difficulty sequencing, Requires verbal cues, Requires tactile cues General Comments: pt with flat affect, confused about start and end of RUE pain and where she was yesterday. answering questions appropriately with increased time        Exercises      General Comments General comments (skin integrity, edema, etc.): spouse present and supportive. pt with blister and pain in RUE, nausea after initial transition to sitting EOB      Pertinent Vitals/Pain Pain Assessment Pain Assessment: 0-10 Pain Score: 10-Worst pain ever Faces Pain Scale: Hurts worst Pain Location: R UE, arm to fingertips, blistered Pain Descriptors / Indicators: Discomfort, Grimacing, Guarding Pain Intervention(s): Limited activity within patient's tolerance, Monitored during session, Repositioned    Home Living  Family/patient expects to be discharged to:: Private residence Living Arrangements: Spouse/significant other;Children (adult daughter) Available Help at Discharge: Family;Available 24 hours/day Type of Home: House Home Access: Level entry       Home Layout: One level Home Equipment: Cane - single point          PT Goals (current goals can now be found in the care plan section) Acute Rehab PT Goals Patient Stated Goal: to get better PT Goal Formulation: With patient/family Time For Goal Achievement: 07/10/22 Potential to Achieve Goals: Good Progress towards PT goals: Progressing toward goals    Frequency    Min 4X/week      PT Plan Current plan remains appropriate       AM-PAC PT "6 Clicks" Mobility   Outcome Measure  Help needed turning from your back to your side while in a flat bed without using bedrails?: A Little Help needed moving from lying on your back to sitting on the side of a flat bed without using bedrails?: A Little Help needed moving to and from a bed to a chair (including a wheelchair)?: A Lot Help needed standing up from a chair using your arms (e.g., wheelchair or bedside chair)?: A Lot Help needed to walk in hospital room?: Total Help needed climbing 3-5 steps with a railing? : Total 6 Click Score: 12    End of Session Equipment Utilized During Treatment: Gait belt Activity Tolerance: Patient tolerated treatment well Patient left: in chair;with chair alarm set;with call bell/phone within reach;with family/visitor present Nurse Communication: Mobility status PT Visit Diagnosis: Unsteadiness on feet (R26.81);Other abnormalities of gait and mobility (R26.89);Difficulty in walking, not elsewhere classified (R26.2);Other symptoms and signs involving the nervous system (R29.898);Muscle weakness (generalized) (M62.81)  Time: 9417-4081 PT Time Calculation (min) (ACUTE ONLY): 33 min  Charges:  $Therapeutic Activity: 8-22 mins                      Vickki Muff, PT, DPT   Acute Rehabilitation Department   Ronnie Derby 06/27/2022, 10:20 AM

## 2022-06-27 NOTE — Progress Notes (Signed)
Inpatient Rehab Admissions Coordinator:     I met with pt. And daughter to discuss potential CIR admit. They are interested, daughter will provide 24/7 care at discharge. I will open a case with pt.'s insurance and pursue for admit.   Clemens Catholic, [place], [facility] Admissions Coordinator  [phone] ([place]) [phone] (office)

## 2022-06-27 NOTE — Evaluation (Signed)
Occupational Therapy Evaluation Patient Details Name: DONNI OGLESBY MRN: [id number] DOB: 22-Oct-1971 Today's Date: 06/27/2022   History of Present Illness Pt is a 51 y.o. female who presented 06/24/22 with L-sided weakness and dysarthria then developed tonic-clonic seizures requiring intubation. Stroke work-up was negative. Extubated 8/31. EEG suggestive of cortical dysfunction in right posterior quadrant likely secondary to underlying stroke as well as moderate diffuse encephalopathy. Pt has been non-compliant to seizure medications. PMH: right parieto-occipital ICH in 2009 of unknown etiology with subsequent seizures, migraine, HTN   Clinical Impression   Prior to this admission, patient living with her husband, working, and driving. Currently, patient presenting with decreased cognition, decreased AROM and pain in bilateral arms, need for assist in all ADLs, mild visual impairment, and decreased activity tolerance. Patient with noted blisters on R forearm (suggestive of adhesive reaction however patient reports not having any reaction to adhesives before) causing 10/10 and need for increased time with all movement. Patient able to transition to EOB and complete transfers with one person hand held assist, however demonstrates difficulty with ambulation with noted buckling in LLE. Patient is currently mod A for all ADLs, with increased assist needed for lower body ADLs. OT recommending AIR placement to promote return to prior level of independence. OT will continue to follow.      Recommendations for follow up therapy are one component of a multi-disciplinary discharge planning process, led by the attending physician.  Recommendations may be updated based on patient status, additional functional criteria and insurance authorization.   Follow Up Recommendations  Acute inpatient rehab (3hours/day)    Assistance Recommended at Discharge Intermittent Supervision/Assistance  Patient can return home with  the following A lot of help with walking and/or transfers;A lot of help with bathing/dressing/bathroom;Assistance with cooking/housework;Direct supervision/assist for medications management;Direct supervision/assist for financial management;Assist for transportation;Help with stairs or ramp for entrance    Functional Status Assessment  Patient has had a recent decline in their functional status and demonstrates the ability to make significant improvements in function in a reasonable and predictable amount of time.  Equipment Recommendations  Other (comment) (Defer to next venue)    Recommendations for Other Services       Precautions / Restrictions Precautions Precautions: Fall;Other (comment) Precaution Comments: seizures Restrictions Weight Bearing Restrictions: No      Mobility Bed Mobility Overal bed mobility: Needs Assistance Bed Mobility: Supine to Sit, Sit to Supine     Supine to sit: Min guard, HOB elevated Sit to supine: Min assist, HOB elevated   General bed mobility comments: Extra time and cues to use L UE to push up to sit L EOB as pt's R UE was swollen with blisters from some reaction (husband reported medical team is aware already). MinA to lift legs to return to supine.    Transfers Overall transfer level: Needs assistance Equipment used: 1 person hand held assist, Rolling walker (2 wheels) Transfers: Sit to/from Stand Sit to Stand: Min assist, Min guard           General transfer comment: pt able to rise to standing with minA initially but progressed to minG. only using single UE support on RW so tried with HHA or no DME in future attempts      Balance Overall balance assessment: Needs assistance Sitting-balance support: No upper extremity supported, Feet supported Sitting balance-Leahy Scale: Fair Sitting balance - Comments: Pt with noted trunk sway when lifting UEs off bed surface to reach off BOS. Min guard-minA for sitting balance.  Standing  balance support: Bilateral upper extremity supported, During functional activity, No upper extremity supported Standing balance-Leahy Scale: Fair Standing balance comment: minA without UE support, pt unable to use RW at this time due to pain in bilateral UE                           ADL either performed or assessed with clinical judgement   ADL Overall ADL's : Needs assistance/impaired Eating/Feeding: Set up;Sitting   Grooming: Minimal assistance;Sitting   Upper Body Bathing: Moderate assistance;Sitting   Lower Body Bathing: Maximal assistance;Total assistance;Sitting/lateral leans;Sit to/from stand   Upper Body Dressing : Moderate assistance;Sitting   Lower Body Dressing: Maximal assistance;Total assistance;Sitting/lateral leans;Sit to/from stand   Toilet Transfer: Minimal assistance;BSC/3in1 Statistician Details (indicate cue type and reason): simulated with transfer to chair Toileting- Clothing Manipulation and Hygiene: Moderate assistance;Sitting/lateral lean;Sit to/from stand       Functional mobility during ADLs: Moderate assistance;Cueing for sequencing;Cueing for safety;Rolling walker (2 wheels) General ADL Comments: Patient presenting with decreased cognition, decreased AROM and pain in bilateral arms, need for assist in all ADLs, mild visual impairment, and decreased activity tolerance     Vision Baseline Vision/History: 1 Wears glasses Ability to See in Adequate Light: 0 Adequate Patient Visual Report: Blurring of vision;Eye fatigue/eye pain/headache Vision Assessment?: Yes Eye Alignment: Within Functional Limits Ocular Range of Motion: Within Functional Limits Alignment/Gaze Preference: Within Defined Limits Tracking/Visual Pursuits: Able to track stimulus in all quads without difficulty Saccades: Other (comment) (Unable to test due to significant pain in bilateral arms) Convergence: Within functional limits Additional Comments: Will continue to  assess vision in functional context, states symptoms are improving (blurred vision) however could not describe symptoms well     Perception     Praxis      Pertinent Vitals/Pain Pain Assessment Pain Assessment: 0-10 Pain Score: 10-Worst pain ever Faces Pain Scale: Hurts worst Pain Location: R UE, arm to fingertips, blistered Pain Descriptors / Indicators: Discomfort, Grimacing, Guarding Pain Intervention(s): Limited activity within patient's tolerance, Monitored during session, Premedicated before session, Repositioned     Hand Dominance     Extremity/Trunk Assessment Upper Extremity Assessment Upper Extremity Assessment: RUE deficits/detail;LUE deficits/detail;Generalized weakness RUE Deficits / Details: blisters on forearm sporadically, looks like an allergy to tape however patient does not have a known allergy to adhesives, area red and minimally swollen around elbow RUE: Unable to fully assess due to pain RUE Sensation: WNL RUE Coordination: decreased fine motor;decreased gross motor LUE Deficits / Details: Pain at L shoulder and unable to progress past shoulder flexion of 120 LUE: Unable to fully assess due to pain;Shoulder pain with ROM LUE Sensation: decreased light touch LUE Coordination: decreased fine motor;decreased gross motor   Lower Extremity Assessment Lower Extremity Assessment: Defer to PT evaluation       Communication Communication Communication: No difficulties   Cognition Arousal/Alertness: Awake/alert Behavior During Therapy: Flat affect Overall Cognitive Status: Impaired/Different from baseline Area of Impairment: Orientation, Attention, Memory, Following commands, Awareness, Safety/judgement, Problem solving                 Orientation Level: Disoriented to, Time, Situation (knew month and year, confused on where she was yesterday) Current Attention Level: Sustained Memory: Decreased short-term memory Following Commands: Follows one step  commands consistently, Follows one step commands with increased time, Follows multi-step commands inconsistently Safety/Judgement: Decreased awareness of deficits, Decreased awareness of safety Awareness: Emergent Problem Solving: Slow processing, Difficulty sequencing, Requires  verbal cues, Requires tactile cues General Comments: pt with flat affect, confused about start and end of RUE pain and where she was yesterday. answering questions appropriately with increased time     General Comments  spouse present and supportive. pt with blister and pain in RUE, nausea after initial transition to sitting EOB    Exercises     Shoulder Instructions      Home Living Family/patient expects to be discharged to:: Private residence Living Arrangements: Spouse/significant other;Children (adult daughter) Available Help at Discharge: Family;Available 24 hours/day Type of Home: House Home Access: Level entry     Home Layout: One level     Bathroom Shower/Tub: Teacher, early years/pre: Standard Bathroom Accessibility: Yes   Home Equipment: Cane - single point          Prior Functioning/Environment Prior Level of Function : Independent/Modified Independent;Driving;Working/employed             Mobility Comments: No AD. Very minor residual weakness on her L from [place] in 2009. ADLs Comments: Works as Freight forwarder at [employer].        OT Problem List: Decreased strength;Decreased range of motion;Decreased activity tolerance;Impaired balance (sitting and/or standing);Impaired vision/perception;Decreased coordination;Decreased cognition;Decreased safety awareness;Decreased knowledge of use of DME or AE;Decreased knowledge of precautions;Impaired UE functional use;Pain;Impaired sensation      OT Treatment/Interventions: Self-care/ADL training;Therapeutic exercise;Neuromuscular education;Energy conservation;DME and/or AE instruction;Manual therapy;Therapeutic  activities;Cognitive remediation/compensation;Visual/perceptual remediation/compensation;Patient/family education;Balance training    OT Goals(Current goals can be found in the care plan section) Acute Rehab OT Goals Patient Stated Goal: to get this pain under control OT Goal Formulation: With patient Time For Goal Achievement: 07/11/22 Potential to Achieve Goals: Good ADL Goals Pt Will Perform Lower Body Bathing: with min guard assist;sit to/from stand;sitting/lateral leans Pt Will Perform Lower Body Dressing: with min guard assist;sitting/lateral leans;sit to/from stand Pt Will Transfer to Toilet: with min guard assist;ambulating Pt Will Perform Toileting - Clothing Manipulation and hygiene: with min guard assist Additional ADL Goal #1: Patient will be able to follow multi-step commands consistently in order to return to previous level of independence. Additional ADL Goal #2: Patient will be able to engage in AROM of bilateral arms in order to participate in ADLs independently.  OT Frequency: Min 3X/week    Co-evaluation              AM-PAC OT "6 Clicks" Daily Activity     Outcome Measure Help from another person eating meals?: A Little Help from another person taking care of personal grooming?: A Little Help from another person toileting, which includes using toliet, bedpan, or urinal?: A Lot Help from another person bathing (including washing, rinsing, drying)?: A Lot Help from another person to put on and taking off regular upper body clothing?: A Little Help from another person to put on and taking off regular lower body clothing?: A Lot 6 Click Score: 15   End of Session Equipment Utilized During Treatment: Gait belt;Rolling walker (2 wheels) Nurse Communication: Mobility status  Activity Tolerance: Patient tolerated treatment well;Other (comment) (Nausea initially sitting EOB, RN notified) Patient left: in chair;with call bell/phone within reach;with chair alarm set  OT  Visit Diagnosis: Unsteadiness on feet (R26.81);Other abnormalities of gait and mobility (R26.89);Muscle weakness (generalized) (M62.81);Apraxia (R48.2);Other symptoms and signs involving the nervous system (R29.898);Other symptoms and signs involving cognitive function                Time: [id number] OT Time Calculation (min): 33 min Charges:  OT General Charges $OT Visit: 1 Visit OT Evaluation $OT Eval Moderate Complexity: 1 Mod  [place] Jeniffer Culliver, OTR/L Acute Rehabilitation Services [phone]   Ascencion Dike 06/27/2022, 3:11 PM

## 2022-06-27 NOTE — Progress Notes (Signed)
Pt began c/o chest pain. Physician notified, EKG completed, O2 placed and pt placed on telemetry monitoring. Upon further assessment pt began c/o headache. History of migraines revealed likely cause of pain. Physician orders carried out, will continue to monitor.

## 2022-06-27 NOTE — Progress Notes (Signed)
PROGRESS NOTE        PATIENT DETAILS Name: [name] Age: 51 y.o. Sex: female Date of Birth: Aug 29, 1971 Admit Date: 06/24/2022 Admitting Physician Lynnell Catalan, MD UKG:[id number], Renda Rolls, MD  Brief Summary: Patient is a 51 y.o.  female with history of right parietal-occipital ICH 2009-seizure disorder, HTN-presented with status epilepticus with Todd's paresis.  Patient was intubated on presentation for airway protection-admitted to the ICU-neurology consulted-antiepileptics adjusted-upon stability-transfer to [facility] service.  Significant events: 8/30>> status epilepticus-Todd's paresis-intubated-admit to ICU. 9/02>> transfer to [facility].  Significant studies: 8/30>> CT head: No acute abnormalities. 8/30>> CT angio head/neck: No LVO/hemodynamically significant stenosis.  Significant microbiology data: 8/30>> MRSA PCR nasal: Negative.  Procedures: 8/30-8/31>> ETT  Consults: PCCM/neurology  Subjective: Lying comfortably in bed-denies any chest pain or shortness of breath.  Continues to have significant pain/inflammation/erythema/bulla formation in the right forearm.  Objective: Vitals: Blood pressure (!) 106/58, pulse 82, temperature 98.1 F (36.7 C), temperature source Oral, resp. rate 16, height 5\' 1"  (1.549 m), weight 101.2 kg, SpO2 95 %.   Exam: Gen Exam:Alert awake-not in any distress HEENT:atraumatic, normocephalic Chest: B/L clear to auscultation anteriorly CVS:S1S2 regular Abdomen:soft non tender, non distended Extremities:no edema-see picture below. Neurology: Non focal Skin: no rash     Pertinent Labs/Radiology:    Latest Ref Rng & Units 06/27/2022    5:31 AM 06/26/2022    4:10 AM 06/25/2022    6:03 AM  CBC  WBC 4.0 - 10.5 K/uL 13.9  18.2  17.3   Hemoglobin 12.0 - 15.0 g/dL 06/27/2022  62.3  76.2   Hematocrit 36.0 - 46.0 % 37.9  36.5  39.1   Platelets 150 - 400 K/uL 302  315  328     Lab Results  Component Value Date   NA 140 06/27/2022    K 3.4 (L) 06/27/2022   CL 111 06/27/2022   CO2 21 (L) 06/27/2022      Assessment/Plan: Acute hypoxic respiratory failure: Intubated in ED for airway protection-extubated on 8/31-stable-remains on room air  Status epilepticus: Acknowledges noncompliance with antiepileptics-resolved-continue zonisamide, Keppra.  Neurology following.  Patient aware of seizure precautions.  Right forearm phlebitis: Some bulla formation today-still tender and inflamed-I have asked nurse to begin warm/cold compresses-we will start doxycycline for a few days.  Mild leukocytosis: Likely reactive-possibly due to ongoing phlebitis.  Supportive care.  Follow periodically.  Hypokalemia: Replete and recheck.  HTN: BP stable-off all antihypertensives.  History of intracranial hemorrhage 2009  Morbid Obesity: Estimated body mass index is 42.16 kg/m as calculated from the following:   Height as of this encounter: 5\' 1"  (1.549 m).   Weight as of this encounter: 101.2 kg.   Code status:   Code Status: Full Code   DVT Prophylaxis: enoxaparin (LOVENOX) injection 40 mg Start: 06/24/22 2200 SCDs Start: 06/24/22 1800   Family Communication: Spouse at bedside   Disposition Plan: Status is: Inpatient Remains inpatient appropriate because: Severity of illness-monitor right forearm-starting antibiotics-cold/warm compresses-if improved-Home in the next few days.   Planned Discharge Destination:Home   Diet: Diet Order             Diet regular Room service appropriate? Yes; Fluid consistency: Thin  Diet effective now                     Antimicrobial agents: Anti-infectives (From admission, onward)  None        MEDICATIONS: Scheduled Meds:  enoxaparin (LOVENOX) injection  40 mg Subcutaneous Q24H   folic acid  1 mg Oral Daily   levETIRAcetam  1,500 mg Oral BID   multivitamin with minerals  1 tablet Per Tube Daily   ondansetron       thiamine (VITAMIN B1) injection  100 mg Intravenous  Daily   zonisamide  100 mg Oral BH-q7a   And   zonisamide  200 mg Oral QHS   Continuous Infusions: PRN Meds:.fentaNYL (SUBLIMAZE) injection, ondansetron, oxyCODONE, senna-docusate, sorbitol   I have personally reviewed following labs and imaging studies  LABORATORY DATA: CBC: Recent Labs  Lab 06/24/22 1300 06/24/22 1906 06/25/22 0603 06/26/22 0410 06/27/22 0531  WBC 15.9* 16.3* 17.3* 18.2* 13.9*  NEUTROABS 8.4*  --   --   --   --   HGB 12.9 13.1 12.5 12.0 12.0  HCT 40.3 40.8 39.1 36.5 37.9  MCV 81.1 81.1 80.8 80.2 82.0  PLT 366 277 328 315 302    Basic Metabolic Panel: Recent Labs  Lab 06/24/22 1300 06/24/22 1906 06/25/22 0603 06/26/22 0410 06/27/22 0531  NA 144  --  144 141 140  K 3.4*  --  3.5 3.1* 3.4*  CL 110  --  109 107 111  CO2 22  --  26 24 21*  GLUCOSE 117*  --  105* 100* 95  BUN 11  --  8 8 9   CREATININE 0.67 0.69 0.56 0.67 0.70  CALCIUM 9.1  --  9.3 8.9 9.1  MG  --   --  2.1 2.0  --   PHOS  --   --  3.7 3.8  --     GFR: Estimated Creatinine Clearance: 91.9 mL/min (by C-G formula based on SCr of 0.7 mg/dL).  Liver Function Tests: Recent Labs  Lab 06/24/22 1300  AST 31  ALT 29  ALKPHOS 88  BILITOT 0.7  PROT 7.1  ALBUMIN 3.8   No results for input(s): "LIPASE", "AMYLASE" in the last 168 hours. No results for input(s): "AMMONIA" in the last 168 hours.  Coagulation Profile: Recent Labs  Lab 06/24/22 1300  INR 1.0    Cardiac Enzymes: No results for input(s): "CKTOTAL", "CKMB", "CKMBINDEX", "TROPONINI" in the last 168 hours.  BNP (last 3 results) No results for input(s): "PROBNP" in the last 8760 hours.  Lipid Profile: Recent Labs    06/25/22 0603  TRIG 171*    Thyroid Function Tests: No results for input(s): "TSH", "T4TOTAL", "FREET4", "T3FREE", "THYROIDAB" in the last 72 hours.  Anemia Panel: No results for input(s): "VITAMINB12", "FOLATE", "FERRITIN", "TIBC", "IRON", "RETICCTPCT" in the last 72 hours.  Urine analysis:     Component Value Date/Time   COLORURINE YELLOW (A) 06/24/2022 1606   APPEARANCEUR CLEAR (A) 06/24/2022 1606   APPEARANCEUR Clear 08/29/2014 1556   LABSPEC 1.019 06/24/2022 1606   LABSPEC 1.012 08/29/2014 1556   PHURINE 6.0 06/24/2022 1606   GLUCOSEU NEGATIVE 06/24/2022 1606   GLUCOSEU Negative 08/29/2014 1556   HGBUR LARGE (A) 06/24/2022 1606   BILIRUBINUR NEGATIVE 06/24/2022 1606   BILIRUBINUR Negative 08/29/2014 1556   KETONESUR NEGATIVE 06/24/2022 1606   PROTEINUR NEGATIVE 06/24/2022 1606   UROBILINOGEN 1.0 03/18/2008 1923   NITRITE NEGATIVE 06/24/2022 1606   LEUKOCYTESUR NEGATIVE 06/24/2022 1606   LEUKOCYTESUR Negative 08/29/2014 1556    Sepsis Labs: Lactic Acid, Venous No results found for: "LATICACIDVEN"  MICROBIOLOGY: Recent Results (from the past 240 hour(s))  Urine Culture  Status: None   Collection Time: 06/24/22  4:06 PM   Specimen: Urine, Random  Result Value Ref Range Status   Specimen Description   Final    URINE, RANDOM Performed at [facility], [address] [place], [place] [address]    Special Requests   Final    NONE Performed at [facility], [address] [place], [place] [address]    Culture   Final    NO GROWTH Performed at [facility], [address], [place], [place] [address]    Report Status 06/25/2022 FINAL  Final  MRSA Next Gen by PCR, Nasal     Status: None   Collection Time: 06/24/22  6:25 PM   Specimen: Nasal Mucosa; Nasal Swab  Result Value Ref Range Status   MRSA by PCR Next Gen NOT DETECTED NOT DETECTED Final    Comment: (NOTE) The GeneXpert MRSA Assay (FDA approved for NASAL specimens only), is one component of a comprehensive MRSA colonization surveillance program. It is not intended to diagnose MRSA infection nor to guide or monitor treatment for MRSA infections. Test performance is not FDA approved in patients less than 33 years old. Performed at [facility], [address]  [place], [place] [address]     RADIOLOGY STUDIES/RESULTS: Overnight EEG with video  Result Date: 06/26/2022 Charlsie Quest, MD     06/26/2022 10:27 AM Patient Name: KENIDI ELENBAAS MRN: [id number] Epilepsy Attending: Charlsie Quest Referring Physician/Provider: Rejeana Brock, MD Duration: 06/25/2022 1032 to 06/26/2022 1000 Patient history: 51 year old female on EEG to evaluate for seizure Level of alertness: Awake, asleep AEDs during EEG study: LEV, ZNS Technical aspects: This EEG study was done with scalp electrodes positioned according to the 10-20 International system of electrode placement. Electrical activity was reviewed with band pass filter of 1-70Hz , sensitivity of 7 uV/mm, display speed of 73mm/sec with a 60Hz  notched filter applied as appropriate. EEG data were recorded continuously and digitally stored.  Video monitoring was available and reviewed as appropriate. Description: During awake state, no clear posterior dominant rhythm was seen.  EEG showed continuous generalized and maximal right posterior quadrant 3-7 Hz theta-delta slowing admixed with 12 to 14 Hz generalized beta activity.  Sleep was characterized by vertex waves, sleep spindles (12 to 14 Hz), maximal frontocentral region. Hyperventilation and photic stimulation were not performed.   ABNORMALITY - Continuous slow, generalized and maximal right posterior quadrant IMPRESSION: This study is suggestive of cortical dysfunction in right posterior quadrant likely secondary to underlying stroke as well as moderate diffuse encephalopathy, nonspecific to etiology.  No seizures or epileptiform discharges were seen throughout the recording. Priyanka     LOS: 3 days   Annabelle Harman, MD  [facility]    To contact the attending provider between 7A-7P or the covering provider during after hours 7P-7A, please log into the web site [email or web address] and access using universal [place] password for that web site. If you do  not have the password, please call the hospital operator.  06/27/2022, 10:29 AM

## 2022-06-28 ENCOUNTER — Inpatient Hospital Stay (HOSPITAL_COMMUNITY): Payer: Commercial Managed Care - PPO

## 2022-06-28 DIAGNOSIS — J9601 Acute respiratory failure with hypoxia: Secondary | ICD-10-CM | POA: Diagnosis not present

## 2022-06-28 DIAGNOSIS — R52 Pain, unspecified: Secondary | ICD-10-CM | POA: Diagnosis not present

## 2022-06-28 DIAGNOSIS — M7989 Other specified soft tissue disorders: Secondary | ICD-10-CM | POA: Diagnosis not present

## 2022-06-28 DIAGNOSIS — G40901 Epilepsy, unspecified, not intractable, with status epilepticus: Secondary | ICD-10-CM | POA: Diagnosis not present

## 2022-06-28 DIAGNOSIS — D72829 Elevated white blood cell count, unspecified: Secondary | ICD-10-CM | POA: Diagnosis not present

## 2022-06-28 LAB — BASIC METABOLIC PANEL
Anion gap: 10 (ref 5–15)
BUN: 12 mg/dL (ref 6–20)
CO2: 19 mmol/L — ABNORMAL LOW (ref 22–32)
Calcium: 9.2 mg/dL (ref 8.9–10.3)
Chloride: 112 mmol/L — ABNORMAL HIGH (ref 98–111)
Creatinine, Ser: 0.78 mg/dL (ref 0.44–1.00)
GFR, Estimated: >60 mL/min (ref >60)
Glucose, Bld: 100 mg/dL — ABNORMAL HIGH (ref 70–99)
Potassium: 3.9 mmol/L (ref 3.5–5.1)
Sodium: 141 mmol/L (ref 135–145)

## 2022-06-28 LAB — CBC
HCT: 36.7 % (ref 36.0–46.0)
Hemoglobin: 11.9 g/dL — ABNORMAL LOW (ref 12.0–15.0)
MCH: 26.1 pg (ref 26.0–34.0)
MCHC: 32.4 g/dL (ref 30.0–36.0)
MCV: 80.5 fL (ref 80.0–100.0)
Platelets: 315 10*3/uL (ref 150–400)
RBC: 4.56 MIL/uL (ref 3.87–5.11)
RDW: 14.6 % (ref 11.5–15.5)
WBC: 13.5 10*3/uL — ABNORMAL HIGH (ref 4.0–10.5)
nRBC: 0 % (ref 0.0–0.2)

## 2022-06-28 MED ORDER — MAGNESIUM SULFATE 2 GM/50ML IV SOLN
2.0000 g | Freq: Once | INTRAVENOUS | Status: AC
Start: 2022-06-28 — End: 2022-06-28
  Administered 2022-06-28: 2 g via INTRAVENOUS
  Filled 2022-06-28: qty 50

## 2022-06-28 MED ORDER — DEXTROSE 5 % IV SOLN
1000.0000 mg | Freq: Once | INTRAVENOUS | Status: AC
Start: 2022-06-28 — End: 2022-06-28
  Administered 2022-06-28: 1000 mg via INTRAVENOUS
  Filled 2022-06-28: qty 10

## 2022-06-28 NOTE — Progress Notes (Signed)
MD notified of no PIV access; unable to complete magnesium and depakote administration/.

## 2022-06-28 NOTE — Progress Notes (Signed)
PROGRESS NOTE        PATIENT DETAILS Name: [name] Age: 51 y.o. Sex: female Date of Birth: 1971-07-07 Admit Date: 06/24/2022 Admitting Physician Kipp Brood, MD [id number], Viann Shove, MD  Brief Summary: Patient is a 51 y.o.  female with history of right parietal-occipital ICH 2009-seizure disorder, HTN-presented with status epilepticus with Todd's paresis.  Patient was intubated on presentation for airway protection-admitted to the ICU-neurology consulted-antiepileptics adjusted-upon stability-transfer to [facility] service.  Significant events: 8/30>> status epilepticus-Todd's paresis-intubated-admit to ICU. 9/02>> transfer to [facility]  Significant studies: 8/30>> CT head: No acute abnormalities. 8/30>> CT angio head/neck: No LVO/hemodynamically significant stenosis.  Significant microbiology data: 8/30>> MRSA PCR nasal: Negative.  Procedures: 8/30-8/31>> ETT  Consults: PCCM/neurology  Subjective: No vomiting overnight-headaches better but still at a 6-8/10.  Continues to complain of pain in the right forearm area.  Blisters remain  intact.  Objective: Vitals: Blood pressure 101/77, pulse 82, temperature 97.7 F (36.5 C), temperature source Oral, resp. rate 18, height 5\' 1"  (1.549 m), weight 101.2 kg, SpO2 94 %.   Exam: Gen Exam:Alert awake-not in any distress HEENT:atraumatic, normocephalic Chest: B/L clear to auscultation anteriorly CVS:S1S2 regular Abdomen:soft non tender, non distended Extremities:no edema Neurology: Non focal Skin: no rash       Pertinent Labs/Radiology:    Latest Ref Rng & Units 06/28/2022    3:29 AM 06/27/2022    5:31 AM 06/26/2022    4:10 AM  CBC  WBC 4.0 - 10.5 K/uL 13.5  13.9  18.2   Hemoglobin 12.0 - 15.0 g/dL 11.9  12.0  12.0   Hematocrit 36.0 - 46.0 % 36.7  37.9  36.5   Platelets 150 - 400 K/uL 315  302  315     Lab Results  Component Value Date   NA 141 06/28/2022   K 3.9 06/28/2022   CL 112 (H)  06/28/2022   CO2 19 (L) 06/28/2022      Assessment/Plan: Acute hypoxic respiratory failure: Intubated in ED for airway protection-extubated on 8/31-stable-remains on room air  Status epilepticus: Acknowledges noncompliance with antiepileptics-no further seizures-remains on zonisamide/Keppra.  Patient aware of driving restrictions.  Right forearm phlebitis: Appears unchanged-continue empiric doxycycline-we will obtain Dopplers today to rule out SVT/DVT.    Migraine headaches: Continues to have headache this morning-although better than yesterday.  Headaches not relieved by cocktail of Toradol/Reglan/Benadryl.  Spoke with neurology-we will dose 1 g of Depakote and 2 g of magnesium sulfate.  Follow-up.  Mild leukocytosis: Likely reactive-possibly due to ongoing phlebitis.  Supportive care.  Follow periodically.  Hypokalemia: Repleted.  HTN: BP stable-off all antihypertensives.  History of intracranial hemorrhage 2009  Morbid Obesity: Estimated body mass index is 42.16 kg/m as calculated from the following:   Height as of this encounter: 5\' 1"  (1.549 m).   Weight as of this encounter: 101.2 kg.   Code status:   Code Status: Full Code   DVT Prophylaxis: enoxaparin (LOVENOX) injection 40 mg Start: 06/24/22 2200 SCDs Start: 06/24/22 1800   Family Communication: Spouse at bedside   Disposition Plan: Status is: Inpatient Remains inpatient appropriate because: Severity of illness-monitor right forearm-starting antibiotics-cold/warm compresses-CIR evaluation ongoing.   Planned Discharge Destination: CIR   Diet: Diet Order             Diet regular Room service appropriate? Yes; Fluid consistency: Thin  Diet effective now  Antimicrobial agents: Anti-infectives (From admission, onward)    Start     Dose/Rate Route Frequency Ordered Stop   06/27/22 1130  doxycycline (VIBRA-TABS) tablet 100 mg        100 mg Oral Every 12 hours 06/27/22 1036           MEDICATIONS: Scheduled Meds:  doxycycline  100 mg Oral Q12H   enoxaparin (LOVENOX) injection  40 mg Subcutaneous Q24H   folic acid  1 mg Oral Daily   levETIRAcetam  1,500 mg Oral BID   multivitamin with minerals  1 tablet Per Tube Daily   thiamine (VITAMIN B1) injection  100 mg Intravenous Daily   zonisamide  100 mg Oral BH-q7a   And   zonisamide  200 mg Oral QHS   Continuous Infusions:  magnesium sulfate bolus IVPB     valproate sodium     PRN Meds:.ketorolac **AND** metoCLOPramide (REGLAN) injection **AND** diphenhydrAMINE, fentaNYL (SUBLIMAZE) injection, ibuprofen, ondansetron (ZOFRAN) IV, oxyCODONE, senna-docusate, sorbitol   I have personally reviewed following labs and imaging studies  LABORATORY DATA: CBC: Recent Labs  Lab 06/24/22 1300 06/24/22 1906 06/25/22 0603 06/26/22 0410 06/27/22 0531 06/28/22 0329  WBC 15.9* 16.3* 17.3* 18.2* 13.9* 13.5*  NEUTROABS 8.4*  --   --   --   --   --   HGB 12.9 13.1 12.5 12.0 12.0 11.9*  HCT 40.3 40.8 39.1 36.5 37.9 36.7  MCV 81.1 81.1 80.8 80.2 82.0 80.5  PLT 366 277 328 315 302 315     Basic Metabolic Panel: Recent Labs  Lab 06/24/22 1300 06/24/22 1906 06/25/22 0603 06/26/22 0410 06/27/22 0531 06/28/22 0329  NA 144  --  144 141 140 141  K 3.4*  --  3.5 3.1* 3.4* 3.9  CL 110  --  109 107 111 112*  CO2 22  --  26 24 21* 19*  GLUCOSE 117*  --  105* 100* 95 100*  BUN 11  --  8 8 9 12   CREATININE 0.67 0.69 0.56 0.67 0.70 0.78  CALCIUM 9.1  --  9.3 8.9 9.1 9.2  MG  --   --  2.1 2.0  --   --   PHOS  --   --  3.7 3.8  --   --      GFR: Estimated Creatinine Clearance: 91.9 mL/min (by C-G formula based on SCr of 0.78 mg/dL).  Liver Function Tests: Recent Labs  Lab 06/24/22 1300  AST 31  ALT 29  ALKPHOS 88  BILITOT 0.7  PROT 7.1  ALBUMIN 3.8    No results for input(s): "LIPASE", "AMYLASE" in the last 168 hours. No results for input(s): "AMMONIA" in the last 168 hours.  Coagulation Profile: Recent  Labs  Lab 06/24/22 1300  INR 1.0     Cardiac Enzymes: No results for input(s): "CKTOTAL", "CKMB", "CKMBINDEX", "TROPONINI" in the last 168 hours.  BNP (last 3 results) No results for input(s): "PROBNP" in the last 8760 hours.  Lipid Profile: No results for input(s): "CHOL", "HDL", "LDLCALC", "TRIG", "CHOLHDL", "LDLDIRECT" in the last 72 hours.   Thyroid Function Tests: No results for input(s): "TSH", "T4TOTAL", "FREET4", "T3FREE", "THYROIDAB" in the last 72 hours.  Anemia Panel: No results for input(s): "VITAMINB12", "FOLATE", "FERRITIN", "TIBC", "IRON", "RETICCTPCT" in the last 72 hours.  Urine analysis:    Component Value Date/Time   COLORURINE YELLOW (A) 06/24/2022 1606   APPEARANCEUR CLEAR (A) 06/24/2022 1606   APPEARANCEUR Clear 08/29/2014 1556   LABSPEC 1.019 06/24/2022 1606  LABSPEC 1.012 08/29/2014 1556   PHURINE 6.0 06/24/2022 1606   GLUCOSEU NEGATIVE 06/24/2022 1606   GLUCOSEU Negative 08/29/2014 1556   HGBUR LARGE (A) 06/24/2022 1606   BILIRUBINUR NEGATIVE 06/24/2022 1606   BILIRUBINUR Negative 08/29/2014 1556   KETONESUR NEGATIVE 06/24/2022 1606   PROTEINUR NEGATIVE 06/24/2022 1606   UROBILINOGEN 1.0 03/18/2008 1923   NITRITE NEGATIVE 06/24/2022 1606   LEUKOCYTESUR NEGATIVE 06/24/2022 1606   LEUKOCYTESUR Negative 08/29/2014 1556    Sepsis Labs: Lactic Acid, Venous No results found for: "LATICACIDVEN"  MICROBIOLOGY: Recent Results (from the past 240 hour(s))  Urine Culture     Status: None   Collection Time: 06/24/22  4:06 PM   Specimen: Urine, Random  Result Value Ref Range Status   Specimen Description   Final    URINE, RANDOM Performed at [facility], [address], [place], [place] [address]    Special Requests   Final    NONE Performed at [facility], [address] [place], [place] [address]    Culture   Final    NO GROWTH Performed at [facility], [address] [place], [place] [address]    Report  Status 06/25/2022 FINAL  Final  MRSA Next Gen by PCR, Nasal     Status: None   Collection Time: 06/24/22  6:25 PM   Specimen: Nasal Mucosa; Nasal Swab  Result Value Ref Range Status   MRSA by PCR Next Gen NOT DETECTED NOT DETECTED Final    Comment: (NOTE) The GeneXpert MRSA Assay (FDA approved for NASAL specimens only), is one component of a comprehensive MRSA colonization surveillance program. It is not intended to diagnose MRSA infection nor to guide or monitor treatment for MRSA infections. Test performance is not FDA approved in patients less than 16 years old. Performed at [facility], [address] [place], [place] [address]     RADIOLOGY STUDIES/RESULTS: No results found.   LOS: 4 days   Jeoffrey Massed, MD  [facility]    To contact the attending provider between 7A-7P or the covering provider during after hours 7P-7A, please log into the web site [email or web address] and access using universal [place] password for that web site. If you do not have the password, please call the hospital operator.  06/28/2022, 11:00 AM

## 2022-06-28 NOTE — Progress Notes (Signed)
VASCULAR LAB    Right upper extremity venous duplex has been performed.  See CV proc for preliminary results.   Satish Hammers, RVT 06/28/2022, 2:23 PM

## 2022-06-29 DIAGNOSIS — G40901 Epilepsy, unspecified, not intractable, with status epilepticus: Secondary | ICD-10-CM | POA: Diagnosis not present

## 2022-06-29 DIAGNOSIS — D72829 Elevated white blood cell count, unspecified: Secondary | ICD-10-CM | POA: Diagnosis not present

## 2022-06-29 LAB — CBC
HCT: 37.7 % (ref 36.0–46.0)
Hemoglobin: 12 g/dL (ref 12.0–15.0)
MCH: 26 pg (ref 26.0–34.0)
MCHC: 31.8 g/dL (ref 30.0–36.0)
MCV: 81.6 fL (ref 80.0–100.0)
Platelets: 338 10*3/uL (ref 150–400)
RBC: 4.62 MIL/uL (ref 3.87–5.11)
RDW: 14.6 % (ref 11.5–15.5)
WBC: 14.3 10*3/uL — ABNORMAL HIGH (ref 4.0–10.5)
nRBC: 0 % (ref 0.0–0.2)

## 2022-06-29 LAB — BASIC METABOLIC PANEL
Anion gap: 11 (ref 5–15)
BUN: 12 mg/dL (ref 6–20)
CO2: 21 mmol/L — ABNORMAL LOW (ref 22–32)
Calcium: 9.1 mg/dL (ref 8.9–10.3)
Chloride: 111 mmol/L (ref 98–111)
Creatinine, Ser: 0.84 mg/dL (ref 0.44–1.00)
GFR, Estimated: >60 mL/min (ref >60)
Glucose, Bld: 113 mg/dL — ABNORMAL HIGH (ref 70–99)
Potassium: 4.1 mmol/L (ref 3.5–5.1)
Sodium: 143 mmol/L (ref 135–145)

## 2022-06-29 MED ORDER — ENOXAPARIN SODIUM 60 MG/0.6ML IJ SOSY
50.0000 mg | PREFILLED_SYRINGE | INTRAMUSCULAR | Status: DC
  Administered 2022-06-29: 50 mg via SUBCUTANEOUS
  Filled 2022-06-29: qty 0.6

## 2022-06-29 NOTE — Progress Notes (Signed)
Inpatient Rehab Admissions Coordinator:    Physical therapy updated recommendations to outpatient PT this AM. Pt. States she would prefer to go home rather than CIR. Husband in the room and states that he can provide 24/7 support at home. CIR will sign off.   Megan Salon, MS, CCC-SLP Rehab Admissions Coordinator  [phone] (celll) [phone] (office)

## 2022-06-29 NOTE — Progress Notes (Signed)
PROGRESS NOTE        PATIENT DETAILS Name: [name] Age: 51 y.o. Sex: female Date of Birth: 1970-12-31 Admit Date: 06/24/2022 Admitting Physician Kipp Brood, MD [id number], Viann Shove, MD  Brief Summary: Patient is a 51 y.o.  female with history of right parietal-occipital ICH 2009-seizure disorder, HTN-presented with status epilepticus with Todd's paresis.  Patient was intubated on presentation for airway protection-admitted to the ICU-neurology consulted-antiepileptics adjusted-upon stability-transfer to [facility] service.  Significant events: 8/30>> status epilepticus-Todd's paresis-intubated-admit to ICU. 9/02>> transfer to [facility].  Significant studies: 8/30>> CT head: No acute abnormalities. 8/30>> CT angio head/neck: No LVO/hemodynamically significant stenosis. 9/02>>  Significant microbiology data: 8/30>> MRSA PCR nasal: Negative.  Procedures: 8/30-8/31>> ETT  Consults: PCCM/neurology  Subjective: Headaches.-Continues to have pain in the right forearm area.  Objective: Vitals: Blood pressure 113/67, pulse 80, temperature 97.7 F (36.5 C), temperature source Oral, resp. rate 19, height 5\' 1"  (1.549 m), weight 101.2 kg, SpO2 97 %.   Exam: Gen Exam:Alert awake-not in any distress HEENT:atraumatic, normocephalic Chest: B/L clear to auscultation anteriorly CVS:S1S2 regular Abdomen:soft non tender, non distended Extremities: Right forearm unchanged.   Neurology: Non focal Skin: no rash   Pertinent Labs/Radiology:    Latest Ref Rng & Units 06/29/2022    3:23 AM 06/28/2022    3:29 AM 06/27/2022    5:31 AM  CBC  WBC 4.0 - 10.5 K/uL 14.3  13.5  13.9   Hemoglobin 12.0 - 15.0 g/dL 12.0  11.9  12.0   Hematocrit 36.0 - 46.0 % 37.7  36.7  37.9   Platelets 150 - 400 K/uL 338  315  302     Lab Results  Component Value Date   NA 143 06/29/2022   K 4.1 06/29/2022   CL 111 06/29/2022   CO2 21 (L) 06/29/2022      Assessment/Plan: Acute hypoxic  respiratory failure: Intubated in ED for airway protection-extubated on 8/31-stable-remains on room air  Status epilepticus: Acknowledges noncompliance with antiepileptics-no further seizures-remains on zonisamide/Keppra.  Patient aware of driving restrictions.  Right forearm phlebitis: Unchanged-continues to have some pain-Dopplers negative for DVT/SVT-continue doxycycline-reassess on 9/5-if appears to be stable-should be able to go home on 9/5.  Migraine headaches: Significant improvement in headaches following 1 dose of Depakote and magnesium sulfate-continue to monitor closely.  Mild leukocytosis: Likely reactive-possibly due to ongoing phlebitis.  Supportive care.  Follow periodically.  Hypokalemia: Repleted.  HTN: BP stable-off all antihypertensives.  History of intracranial hemorrhage 2009  Morbid Obesity: Estimated body mass index is 42.16 kg/m as calculated from the following:   Height as of this encounter: 5\' 1"  (1.549 m).   Weight as of this encounter: 101.2 kg.   Code status:   Code Status: Full Code   DVT Prophylaxis: SCDs Start: 06/24/22 1800   Family Communication: Spouse at bedside   Disposition Plan: Status is: Inpatient Remains inpatient appropriate because: Severity of illness   Planned Discharge Destination: CIR   Diet: Diet Order             Diet regular Room service appropriate? Yes; Fluid consistency: Thin  Diet effective now                     Antimicrobial agents: Anti-infectives (From admission, onward)    Start     Dose/Rate Route Frequency Ordered Stop   06/27/22  1130  doxycycline (VIBRA-TABS) tablet 100 mg        100 mg Oral Every 12 hours 06/27/22 1036          MEDICATIONS: Scheduled Meds:  doxycycline  100 mg Oral Q12H   enoxaparin (LOVENOX) injection  50 mg Subcutaneous Q24H   folic acid  1 mg Oral Daily   levETIRAcetam  1,500 mg Oral BID   multivitamin with minerals  1 tablet Per Tube Daily   thiamine (VITAMIN B1)  injection  100 mg Intravenous Daily   zonisamide  100 mg Oral BH-q7a   And   zonisamide  200 mg Oral QHS   Continuous Infusions:  PRN Meds:.ketorolac **AND** metoCLOPramide (REGLAN) injection **AND** diphenhydrAMINE, fentaNYL (SUBLIMAZE) injection, ondansetron (ZOFRAN) IV, oxyCODONE, senna-docusate, sorbitol   I have personally reviewed following labs and imaging studies  LABORATORY DATA: CBC: Recent Labs  Lab 06/24/22 1300 06/24/22 1906 06/25/22 0603 06/26/22 0410 06/27/22 0531 06/28/22 0329 06/29/22 0323  WBC 15.9*   < > 17.3* 18.2* 13.9* 13.5* 14.3*  NEUTROABS 8.4*  --   --   --   --   --   --   HGB 12.9   < > 12.5 12.0 12.0 11.9* 12.0  HCT 40.3   < > 39.1 36.5 37.9 36.7 37.7  MCV 81.1   < > 80.8 80.2 82.0 80.5 81.6  PLT 366   < > 328 315 302 315 338   < > = values in this interval not displayed.     Basic Metabolic Panel: Recent Labs  Lab 06/25/22 0603 06/26/22 0410 06/27/22 0531 06/28/22 0329 06/29/22 0323  NA 144 141 140 141 143  K 3.5 3.1* 3.4* 3.9 4.1  CL 109 107 111 112* 111  CO2 26 24 21* 19* 21*  GLUCOSE 105* 100* 95 100* 113*  BUN 8 8 9 12 12   CREATININE 0.56 0.67 0.70 0.78 0.84  CALCIUM 9.3 8.9 9.1 9.2 9.1  MG 2.1 2.0  --   --   --   PHOS 3.7 3.8  --   --   --      GFR: Estimated Creatinine Clearance: 87.5 mL/min (by C-G formula based on SCr of 0.84 mg/dL).  Liver Function Tests: Recent Labs  Lab 06/24/22 1300  AST 31  ALT 29  ALKPHOS 88  BILITOT 0.7  PROT 7.1  ALBUMIN 3.8    No results for input(s): "LIPASE", "AMYLASE" in the last 168 hours. No results for input(s): "AMMONIA" in the last 168 hours.  Coagulation Profile: Recent Labs  Lab 06/24/22 1300  INR 1.0     Cardiac Enzymes: No results for input(s): "CKTOTAL", "CKMB", "CKMBINDEX", "TROPONINI" in the last 168 hours.  BNP (last 3 results) No results for input(s): "PROBNP" in the last 8760 hours.  Lipid Profile: No results for input(s): "CHOL", "HDL", "LDLCALC",  "TRIG", "CHOLHDL", "LDLDIRECT" in the last 72 hours.   Thyroid Function Tests: No results for input(s): "TSH", "T4TOTAL", "FREET4", "T3FREE", "THYROIDAB" in the last 72 hours.  Anemia Panel: No results for input(s): "VITAMINB12", "FOLATE", "FERRITIN", "TIBC", "IRON", "RETICCTPCT" in the last 72 hours.  Urine analysis:    Component Value Date/Time   COLORURINE YELLOW (A) 06/24/2022 1606   APPEARANCEUR CLEAR (A) 06/24/2022 1606   APPEARANCEUR Clear 08/29/2014 1556   LABSPEC 1.019 06/24/2022 1606   LABSPEC 1.012 08/29/2014 1556   PHURINE 6.0 06/24/2022 1606   GLUCOSEU NEGATIVE 06/24/2022 1606   GLUCOSEU Negative 08/29/2014 1556   HGBUR LARGE (A) 06/24/2022 1606  BILIRUBINUR NEGATIVE 06/24/2022 1606   BILIRUBINUR Negative 08/29/2014 1556   KETONESUR NEGATIVE 06/24/2022 1606   PROTEINUR NEGATIVE 06/24/2022 1606   UROBILINOGEN 1.0 03/18/2008 1923   NITRITE NEGATIVE 06/24/2022 1606   LEUKOCYTESUR NEGATIVE 06/24/2022 1606   LEUKOCYTESUR Negative 08/29/2014 1556    Sepsis Labs: Lactic Acid, Venous No results found for: "LATICACIDVEN"  MICROBIOLOGY: Recent Results (from the past 240 hour(s))  Urine Culture     Status: None   Collection Time: 06/24/22  4:06 PM   Specimen: Urine, Random  Result Value Ref Range Status   Specimen Description   Final    URINE, RANDOM Performed at [facility], [address] [place], [place] [address]    Special Requests   Final    NONE Performed at [facility], [address] [place], [place] [address]    Culture   Final    NO GROWTH Performed at [facility], [address] [place], [place] [address]    Report Status 06/25/2022 FINAL  Final  MRSA Next Gen by PCR, Nasal     Status: None   Collection Time: 06/24/22  6:25 PM   Specimen: Nasal Mucosa; Nasal Swab  Result Value Ref Range Status   MRSA by PCR Next Gen NOT DETECTED NOT DETECTED Final    Comment: (NOTE) The GeneXpert MRSA Assay (FDA approved for  NASAL specimens only), is one component of a comprehensive MRSA colonization surveillance program. It is not intended to diagnose MRSA infection nor to guide or monitor treatment for MRSA infections. Test performance is not FDA approved in patients less than 1 years old. Performed at [facility], [address], [place], [place] [address]     RADIOLOGY STUDIES/RESULTS: VAS [place] UPPER EXTREMITY VENOUS DUPLEX  Result Date: 06/28/2022 UPPER VENOUS STUDY  Patient Name:  [name]  Date of Exam:   06/28/2022 Medical Rec #: [id number]       Accession #:    [id number] Date of Birth: November 07, 1970       Patient Gender: F Patient Age:   59 years Exam Location:  [facility] Procedure:      VAS [place] UPPER EXTREMITY VENOUS DUPLEX Referring Phys: Jeoffrey Massed --------------------------------------------------------------------------------  Indications: Pain, Swelling, and Blisters secondary to IV infiltration Limitations: Pain with touch and compression. Comparison Study: No prior study on file Performing Technologist: Sherren Kerns RVS  Examination Guidelines: A complete evaluation includes B-mode imaging, spectral Doppler, color Doppler, and power Doppler as needed of all accessible portions of each vessel. Bilateral testing is considered an integral part of a complete examination. Limited examinations for reoccurring indications may be performed as noted.  Right Findings: +----------+------------+---------+-----------+----------+---------------+ RIGHT     CompressiblePhasicitySpontaneousProperties    Summary     +----------+------------+---------+-----------+----------+---------------+ IJV           Full       Yes       Yes                              +----------+------------+---------+-----------+----------+---------------+ Subclavian               Yes       Yes                              +----------+------------+---------+-----------+----------+---------------+ Axillary  Yes       Yes                              +----------+------------+---------+-----------+----------+---------------+ Brachial                                            patent by color +----------+------------+---------+-----------+----------+---------------+ Radial        Full                                                  +----------+------------+---------+-----------+----------+---------------+ Ulnar         Full                                                  +----------+------------+---------+-----------+----------+---------------+ Cephalic      Full                                                  +----------+------------+---------+-----------+----------+---------------+ Basilic       Full                                                  +----------+------------+---------+-----------+----------+---------------+  Left Findings: +----------+------------+---------+-----------+----------+-------+ LEFT      CompressiblePhasicitySpontaneousPropertiesSummary +----------+------------+---------+-----------+----------+-------+ Subclavian               Yes       Yes                      +----------+------------+---------+-----------+----------+-------+  Summary:  Right: No evidence of deep vein thrombosis in the upper extremity. No evidence of superficial vein thrombosis in the upper extremity.  Left: No evidence of thrombosis in the subclavian.  *See table(s) above for measurements and observations.  Diagnosing physician: Monica Martinez MD Electronically signed by Monica Martinez MD on 06/28/2022 at 3:45:19 PM.    Final      LOS: 5 days   Oren Binet, MD  [facility]    To contact the attending provider between 7A-7P or the covering provider during after hours 7P-7A, please log into the web site [email or web address] and access using universal [place] password for that web site. If you do not have the password, please call the  hospital operator.  06/29/2022, 1:15 PM

## 2022-06-29 NOTE — Progress Notes (Signed)
Physical Therapy Treatment Patient Details Name: [name] MRN: [id number] DOB: [birth year] Today's Date: 06/29/2022   History of Present Illness Pt is a 51 y.o. female who presented 06/24/22 with L-sided weakness and dysarthria then developed tonic-clonic seizures requiring intubation. Stroke work-up was negative. Extubated 8/31. EEG suggestive of cortical dysfunction in right posterior quadrant likely secondary to underlying stroke as well as moderate diffuse encephalopathy. Pt has been non-compliant to seizure medications. PMH: right parieto-occipital ICH in 2009 of unknown etiology with subsequent seizures, migraine, HTN    PT Comments    Pt received in supine, lights out/room dark, pt and spouse present and pt agreeable to therapy session with encouragement. Pt making good progress toward goals, able to mobilize today with +1 assist with and without RW and no LE buckling but chair follow for safety. Pt needing up to minA for gait without RW, recommend pt utilize RW for now for all OOB mobility given improving RUE pain and pt able to grip device now. Pt and spouse agreeable to neuro OPPT upon DC and she reports she will have initial 24/7 assist from family members upon DC, discussed with supervising PT Anessa P and updated below. Pt continues to benefit from PT services to progress toward functional mobility goals.   Recommendations for follow up therapy are one component of a multi-disciplinary discharge planning process, led by the attending physician.  Recommendations may be updated based on patient status, additional functional criteria and insurance authorization.  Follow Up Recommendations  Outpatient PT (neuro OPPT)     Assistance Recommended at Discharge Frequent or constant Supervision/Assistance  Patient can return home with the following Two people to help with walking and/or transfers;A lot of help with bathing/dressing/bathroom;Assistance with cooking/housework;Direct  supervision/assist for medications management;Direct supervision/assist for financial management;Assist for transportation   Equipment Recommendations  Rolling walker (2 wheels);BSC/3in1 (continue to assess pending progress)    Recommendations for Other Services       Precautions / Restrictions Precautions Precautions: Fall Precaution Comments: seizures, RUE blistering/pain (possible tape allergy) Restrictions Weight Bearing Restrictions: No     Mobility  Bed Mobility Overal bed mobility: Needs Assistance Bed Mobility: Supine to Sit     Supine to sit: HOB elevated, Supervision     General bed mobility comments: Increased time with pt only using LUE to push through elbow.    Transfers Overall transfer level: Needs assistance Equipment used: Rolling walker (2 wheels) Transfers: Sit to/from Stand Sit to Stand: Min guard           General transfer comment: pt able to rise to standing with min guard A with hands on RW, stands from EOB and to recliner/chair without arms; also pulling up on sink counter standing to sink    Ambulation/Gait Ambulation/Gait assistance: Min guard, +2 safety/equipment, Min assist Gait Distance (Feet): 150 Feet Assistive device: Rolling walker (2 wheels), None Gait Pattern/deviations: Step-through pattern, Decreased stride length, Decreased step length - left, Trunk flexed, Knee flexed in stance - right       General Gait Details: min guard using RW with min cues for R grip correction and proximity, chair follow for safety. last ~46ft with HHA on RUE and up to minA, mostly min guard, less steady without RW but no buckling. Inconsistent stride length and clearance without RW and improved step length with RW. Limited DF on LLE. No instances of buckling, but pt maintains RLE externally rotated throughout and reports this is her normal.  Modified Rankin (Stroke Patients Only) Modified  Rankin (Stroke Patients Only) Pre-Morbid Rankin Score: No  symptoms Modified Rankin: Moderately severe disability     Balance Overall balance assessment: Needs assistance Sitting-balance support: No upper extremity supported, Feet supported Sitting balance-Leahy Scale: Good     Standing balance support: Bilateral upper extremity supported, Reliant on assistive device for balance Standing balance-Leahy Scale: Fair Standing balance comment: Fair static poor dynamic, min guard for safety                            Cognition Arousal/Alertness: Awake/alert Behavior During Therapy: Flat affect Overall Cognitive Status: Within Functional Limits for tasks assessed                                 General Comments: Increased processing time, some decreased RUE/RLE awareness (hand slipping from RW handle but not correcting until cued).        Exercises Other Exercises Other Exercises: BUE AROM: wrist and elbow flex/ext x5-10 reps ea, pain limiting reps on RUE    General Comments General comments (skin integrity, edema, etc.): HR 77 to 92 bpm with exertion, BP 125/69 (84) post exertion, no dizziness reported. MAP (93) earlier in day at rest      Pertinent Vitals/Pain Pain Assessment Pain Assessment: Faces Faces Pain Scale: Hurts even more Pain Location: R UE forearm just distal to elbow Pain Descriptors / Indicators: Discomfort, Grimacing, Guarding Pain Intervention(s): Limited activity within patient's tolerance, Monitored during session, Repositioned, Other (comment) (encouraged elevation for edema relief, pt reports has been using heating packs (on L neck side for pain, possibly also forearm?) encouraged pt to consider ice PRN due to blistering/edema rather than heat.)           PT Goals (current goals can now be found in the care plan section) Acute Rehab PT Goals Patient Stated Goal: to get better PT Goal Formulation: With patient/family Time For Goal Achievement: 07/10/22 Progress towards PT goals:  Progressing toward goals    Frequency    Min 4X/week      PT Plan Current plan remains appropriate    Co-evaluation PT/OT/SLP Co-Evaluation/Treatment: Yes Reason for Co-Treatment: Necessary to address cognition/behavior during functional activity;To address functional/ADL transfers;For patient/therapist safety PT goals addressed during session: Balance;Mobility/safety with mobility;Proper use of DME OT goals addressed during session: Strengthening/ROM;ADL's and self-care      AM-PAC PT "6 Clicks" Mobility   Outcome Measure  Help needed turning from your back to your side while in a flat bed without using bedrails?: A Little Help needed moving from lying on your back to sitting on the side of a flat bed without using bedrails?: A Little Help needed moving to and from a bed to a chair (including a wheelchair)?: A Little Help needed standing up from a chair using your arms (e.g., wheelchair or bedside chair)?: A Little Help needed to walk in hospital room?: A Little Help needed climbing 3-5 steps with a railing? : A Lot 6 Click Score: 17    End of Session Equipment Utilized During Treatment: Gait belt Activity Tolerance: Patient tolerated treatment well Patient left: in chair;with call bell/phone within reach;with chair alarm set;with family/visitor present (spouse present, PTA showed him how to pause chair alarm if needed so he can assist her to bathroom/BSC) Nurse Communication: Mobility status PT Visit Diagnosis: Unsteadiness on feet (R26.81);Other abnormalities of gait and mobility (R26.89);Difficulty in walking, not elsewhere classified (  R26.2);Other symptoms and signs involving the nervous system (R29.898);Muscle weakness (generalized) (M62.81)     Time: 8315-1761 PT Time Calculation (min) (ACUTE ONLY): 29 min  Charges:  $Gait Training: 8-22 mins                     Emmanuella Mirante P., PTA Acute Rehabilitation Services Secure Chat Preferred 9a-5:30pm Office: [phone]     Angus Palms 06/29/2022, 12:53 PM

## 2022-06-29 NOTE — Progress Notes (Signed)
Occupational Therapy Treatment Patient Details Name: [name] MRN: [id number] DOB: 12-21-1970 Today's Date: 06/29/2022   History of present illness Pt is a 51 y.o. female who presented 06/24/22 with L-sided weakness and dysarthria then developed tonic-clonic seizures requiring intubation. Stroke work-up was negative. Extubated 8/31. EEG suggestive of cortical dysfunction in right posterior quadrant likely secondary to underlying stroke as well as moderate diffuse encephalopathy. Pt has been non-compliant to seizure medications. PMH: right parieto-occipital ICH in 2009 of unknown etiology with subsequent seizures, migraine, HTN   OT comments  This 51 yo female doing better overall today with basic ADLs and mobility with attempting to use RUE more today with encouragement. She is doing so much better D/C plan has now been changed to HHOT. We will continue to follow.   Recommendations for follow up therapy are one component of a multi-disciplinary discharge planning process, led by the attending physician.  Recommendations may be updated based on patient status, additional functional criteria and insurance authorization.    Follow Up Recommendations  Home health OT    Assistance Recommended at Discharge PRN  Patient can return home with the following  A little help with walking and/or transfers;A little help with bathing/dressing/bathroom;Assistance with feeding;Help with stairs or ramp for entrance;Assistance with cooking/housework;Assist for transportation   Equipment Recommendations  Tub/shower seat       Precautions / Restrictions Precautions Precautions: Fall Precaution Comments: seizures Restrictions Weight Bearing Restrictions: No       Mobility Bed Mobility Overal bed mobility: Needs Assistance Bed Mobility: Supine to Sit     Supine to sit: Min guard, HOB elevated     General bed mobility comments: Increased time with pt only using LUE to push through elbow     Transfers Overall transfer level: Needs assistance Equipment used: Rolling walker (2 wheels) Transfers: Sit to/from Stand Sit to Stand: Min guard           General transfer comment: pt able to rise to standing with min guard A with hands on RW (cannot push with hands on bed, still too painful)     Balance Overall balance assessment: Needs assistance Sitting-balance support: No upper extremity supported, Feet supported Sitting balance-Leahy Scale: Good     Standing balance support: Bilateral upper extremity supported, Reliant on assistive device for balance Standing balance-Leahy Scale: Poor                             ADL either performed or assessed with clinical judgement   ADL Overall ADL's : Needs assistance/impaired   Eating/Feeding Details (indicate cue type and reason): Encouraged her to try and start eating part of her meal with her RUE Grooming: Oral care;Supervision/safety;Sitting Grooming Details (indicate cue type and reason): Used RUE to brush teeth but more leaning over the sink than bringing hand up to mouth                 Toilet Transfer: Minimal assistance Toilet Transfer Details (indicate cue type and reason): simulated bed>walking down hallway and back to room, sit in chair as sink to brush teeth then to recliner                Extremity/Trunk Assessment Upper Extremity Assessment Upper Extremity Assessment: RUE deficits/detail RUE Deficits / Details: blisters on forearm sporadically more up towards elbow, has full AROM but painful at elbow/forearm area at rest and with movement and movements are slower than would be normal  LUE Deficits / Details: Pain at L shoulder and unable to progress past shoulder flexion of 120; all movements are slower than normal, but has full AROM except at shoulder LUE Coordination: decreased gross motor            Vision Baseline Vision/History: 1 Wears glasses Ability to See in Adequate Light:  0 Adequate Patient Visual Report: Eye fatigue/eye pain/headache            Cognition Arousal/Alertness: Awake/alert Behavior During Therapy: Flat affect Overall Cognitive Status: Within Functional Limits for tasks assessed                                                General Comments VSS    Pertinent Vitals/ Pain       Pain Assessment Pain Assessment: 0-10 Pain Score: 6  Pain Location: R UE mid arm(elbow area) Pain Descriptors / Indicators: Discomfort, Grimacing, Guarding Pain Intervention(s): Limited activity within patient's tolerance, Monitored during session, Repositioned         Frequency  Min 3X/week        Progress Toward Goals  OT Goals(current goals can now be found in the care plan section)  Progress towards OT goals: Progressing toward goals  Acute Rehab OT Goals Patient Stated Goal: to continue to get better and go home OT Goal Formulation: With patient Time For Goal Achievement: 07/11/22 Potential to Achieve Goals: Good  Plan Discharge plan needs to be updated    Co-evaluation    PT/OT/SLP Co-Evaluation/Treatment: Yes Reason for Co-Treatment: For patient/therapist safety;To address functional/ADL transfers PT goals addressed during session: Mobility/safety with mobility;Strengthening/ROM OT goals addressed during session: Strengthening/ROM;ADL's and self-care      AM-PAC OT "6 Clicks" Daily Activity     Outcome Measure   Help from another person eating meals?: A Little Help from another person taking care of personal grooming?: A Little Help from another person toileting, which includes using toliet, bedpan, or urinal?: A Lot Help from another person bathing (including washing, rinsing, drying)?: A Lot Help from another person to put on and taking off regular upper body clothing?: A Little Help from another person to put on and taking off regular lower body clothing?: A Lot 6 Click Score: 15    End of Session  Equipment Utilized During Treatment: Gait belt;Rolling walker (2 wheels)  OT Visit Diagnosis: Other abnormalities of gait and mobility (R26.89);Muscle weakness (generalized) (M62.81);Pain Pain - Right/Left: Left (both) Pain - part of body: Arm (R.L)   Activity Tolerance Patient tolerated treatment well   Patient Left in chair;with call bell/phone within reach;with chair alarm set           Time: 8676-1950 OT Time Calculation (min): 31 min  Charges: OT General Charges $OT Visit: 1 Visit OT Treatments $Self Care/Home Management : 8-22 mins  Ignacia Palma, OTR/L Acute Rehab Services [facility] [phone] Office [phone]    Evette Georges 06/29/2022, 11:17 AM

## 2022-06-29 NOTE — TOC Initial Note (Signed)
Transition of Care [facility]) - Initial/Assessment Note    Patient Details  Name: [name] MRN: [id number] Date of Birth: 1971/01/06  [facility] CM/SW Contact:    Harriet Masson, RN Phone Number: 06/29/2022, 1:51 PM  Clinical Narrative:                 Spoke to daughter, [name], on the phone and patient was apart of the conversation. Patient wants to go home with OP rehab at [facility]. Referral sent and instructions added to AVS.  DME, BSC and walker ordered from [employer].  TOC will continue to follow for needs.  Expected Discharge Plan: OP Rehab Barriers to Discharge: Continued Medical Work up   Patient Goals and CMS Choice Patient states their goals for this hospitalization and ongoing recovery are:: return  home CMS Medicare.gov Compare Post Acute Care list provided to:: Patient Choice offered to / list presented to : Patient  Expected Discharge Plan and Services Expected Discharge Plan: OP Rehab   Discharge Planning Services: CM Consult   Living arrangements for the past 2 months: Single Family Home                 DME Arranged: Bedside commode, Walker rolling DME Agency: [employer] Date DME Agency Contacted: 06/29/22 Time DME Agency Contacted: 1350 Representative spoke with at DME Agency: Lawernce Keas            Prior Living Arrangements/Services Living arrangements for the past 2 months: Single Family Home Lives with:: Spouse Patient language and need for interpreter reviewed:: Yes Do you feel safe going back to the place where you live?: Yes      Need for Family Participation in Patient Care: Yes (Comment) Care giver support system in place?: Yes (comment)   Criminal Activity/Legal Involvement Pertinent to Current Situation/Hospitalization: No - Comment as needed  Activities of Daily Living Home Assistive Devices/Equipment: None ADL Screening (condition at time of admission) Patient's cognitive ability adequate to safely complete daily activities?:  Yes Is the patient deaf or have difficulty hearing?: No Does the patient have difficulty seeing, even when wearing glasses/contacts?: No Does the patient have difficulty concentrating, remembering, or making decisions?: No Patient able to express need for assistance with ADLs?: Yes Does the patient have difficulty dressing or bathing?: Yes Independently performs ADLs?: No Communication: Dependent Is this a change from baseline?: Change from baseline, expected to last <3 days Dressing (OT): Dependent Is this a change from baseline?: Change from baseline, expected to last <3days Grooming: Dependent Is this a change from baseline?: Change from baseline, expected to last <3 days Feeding: Dependent Is this a change from baseline?: Change from baseline, expected to last <3 days Bathing: Dependent Is this a change from baseline?: Change from baseline, expected to last <3 days Toileting: Dependent Is this a change from baseline?: Change from baseline, expected to last <3 days In/Out Bed: Dependent Is this a change from baseline?: Change from baseline, expected to last <3 days Walks in Home: Dependent Is this a change from baseline?: Change from baseline, expected to last <3 days Does the patient have difficulty walking or climbing stairs?: Yes Weakness of Legs: Both Weakness of Arms/Hands: Both  Permission Sought/Granted                  Emotional Assessment       Orientation: : Oriented to Situation, Oriented to  Time, Oriented to Place, Oriented to Self Alcohol / Substance Use: Not Applicable    Admission  diagnosis:  Status epilepticus (HCC) [G40.901] Patient Active Problem List   Diagnosis Date Noted   Acute respiratory failure (HCC)    Leukocytosis    Status epilepticus (HCC) 06/24/2022   History of hemorrhagic stroke with residual hemiparesis (HCC) 01/27/2021   Prediabetes 12/06/2020   Obesity (BMI 30.0-34.9) 12/06/2020   Chronic pain 08/09/2020   Headache    Hx of  cerebral infarction 06/24/2020   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

## 2022-06-30 ENCOUNTER — Other Ambulatory Visit (HOSPITAL_COMMUNITY): Payer: Self-pay

## 2022-06-30 DIAGNOSIS — Z8673 Personal history of transient ischemic attack (TIA), and cerebral infarction without residual deficits: Secondary | ICD-10-CM

## 2022-06-30 DIAGNOSIS — G40901 Epilepsy, unspecified, not intractable, with status epilepticus: Secondary | ICD-10-CM | POA: Diagnosis not present

## 2022-06-30 DIAGNOSIS — G894 Chronic pain syndrome: Secondary | ICD-10-CM | POA: Diagnosis not present

## 2022-06-30 DIAGNOSIS — J9601 Acute respiratory failure with hypoxia: Secondary | ICD-10-CM | POA: Diagnosis not present

## 2022-06-30 MED ORDER — DOXYCYCLINE HYCLATE 100 MG PO TABS
100.0000 mg | ORAL_TABLET | Freq: Two times a day (BID) | ORAL | 0 refills | Status: AC
  Filled 2022-06-30: qty 4, 2d supply, fill #0

## 2022-06-30 MED ORDER — LEVETIRACETAM 750 MG PO TABS
1500.0000 mg | ORAL_TABLET | Freq: Two times a day (BID) | ORAL | 3 refills | Status: DC
  Filled 2022-06-30: qty 120, 30d supply, fill #0

## 2022-06-30 NOTE — Progress Notes (Signed)
Discharge instructions reviewed with pt and family at bedside.  Copy of instructions given to pt. 2 scripts filled by [facility] and will be picked up as pt is taken out for discharge. Pt d/c'd with her DME: walker and 3 in 1.  At 1135 Pt d/c'd via wheelchair with belongings, with family.            Escorted by hospital volunteer.

## 2022-06-30 NOTE — Discharge Summary (Signed)
PATIENT DETAILS Name: [name] Age: 51 y.o. Sex: female Date of Birth: Aug 17, 1971 MRN: [id number]. Admitting Physician: Lynnell Catalan, MD VEL:[id number], Renda Rolls, MD  Admit Date: 06/24/2022 Discharge date: 06/30/2022  Recommendations for Outpatient Follow-up:  Follow up with PCP in 1-2 weeks Please obtain CMP/CBC in one week Please ensure follow-up with neurology.  Admitted From:  Home  Disposition: Outpatient PT.   Discharge Condition: good  CODE STATUS:   Code Status: Full Code   Diet recommendation:  Diet Order             Diet - low sodium heart healthy           Diet regular Room service appropriate? Yes; Fluid consistency: Thin  Diet effective now                    Brief Summary: Patient is a 51 y.o.  female with history of right parietal-occipital ICH 2009-seizure disorder, HTN-presented with status epilepticus with Todd's paresis.  Patient was intubated on presentation for airway protection-admitted to the ICU-neurology consulted-antiepileptics adjusted-upon stability-transfer to [facility] service.   Significant events: 8/30>> status epilepticus-Todd's paresis-intubated-admit to ICU. 9/02>> transfer to [facility].   Significant studies: 8/30>> CT head: No acute abnormalities. 8/30>> CT angio head/neck: No LVO/hemodynamically significant stenosis. 9/03>> RUQ ultrasound: No DVT.   Significant microbiology data: 8/30>> MRSA PCR nasal: Negative.   Procedures: 8/30-8/31>> ETT   Consults: PCCM/neurology  Brief Hospital Course: Acute hypoxic respiratory failure: Intubated in ED for airway protection-extubated on 8/31-stable-remains on room air   Status epilepticus: Acknowledges noncompliance with antiepileptics-no further seizures-remains on zonisamide/Keppra.  Patient aware of driving restrictions.   Right forearm phlebitis: Unchanged compared to most recent picture (available in chart).  Dopplers negative for SVT.  Empirically on doxycycline-we will plan x5  days treatment.  Continue supportive/local care.   Migraine headaches: No further headaches-given cocktail of Toradol/Reglan/Benadryl and also 1 dose of Depakote/magnesium sulfate.     Mild leukocytosis: Likely reactive-possibly due to ongoing phlebitis.  Supportive care.  Follow periodically.   Hypokalemia: Repleted.   HTN: BP stable-off all antihypertensives.  Apparently not taking any of antihypertensives due to normal BP-follow-up with PCP.   History of intracranial hemorrhage 2009   Morbid Obesity: Estimated body mass index is 42.16 kg/m as calculated from the following:   Height as of this encounter: 5\' 1"  (1.549 m).   Weight as of this encounter: 101.2 kg.     Discharge Diagnoses:  Principal Problem:   Status epilepticus (HCC) Active Problems:   Acute respiratory failure (HCC)   Leukocytosis   Discharge Instructions:  Activity:  As tolerated   Discharge Instructions     Ambulatory referral to Occupational Therapy   Complete by: As directed    Ambulatory referral to Physical Therapy   Complete by: As directed    Call MD for:   Complete by: As directed    Recurrent breakthrough seizures.   Call MD for:  extreme fatigue   Complete by: As directed    Call MD for:  persistant dizziness or light-headedness   Complete by: As directed    Call MD for:  redness, tenderness, or signs of infection (Collins, swelling, redness, odor or green/yellow discharge around incision site)   Complete by: As directed    Diet - low sodium heart healthy   Complete by: As directed    Discharge instructions   Complete by: As directed    Follow with Primary MD  , MD in  1-2 weeks  Please follow-up with your primary neurologist-please give them a call and schedule a follow-up appointment. Please get a complete blood count and chemistry panel checked by your Primary MD at your next visit, and again as instructed by your Primary MD.  Get Medicines reviewed and adjusted: Please  take all your medications with you for your next visit with your Primary MD  Laboratory/radiological data: Please request your Primary MD to go over all hospital tests and procedure/radiological results at the follow up, please ask your Primary MD to get all Hospital records sent to his/her office.  In some cases, they will be blood work, cultures and biopsy results pending at the time of your discharge. Please request that your primary care M.D. follows up on these results.  Also Note the following: If you experience worsening of your admission symptoms, develop shortness of breath, life threatening emergency, suicidal or homicidal thoughts you must seek medical attention immediately by calling 911 or calling your MD immediately  if symptoms less severe.  You must read complete instructions/literature along with all the possible adverse reactions/side effects for all the Medicines you take and that have been prescribed to you. Take any new Medicines after you have completely understood and accpet all the possible adverse reactions/side effects.   Do not drive when taking Collins medications or sleeping medications (Benzodaizepines)  Do not take more than prescribed Collins, Sleep and Anxiety Medications. It is not advisable to combine anxiety,sleep and Collins medications without talking with your primary care practitioner  Special Instructions: If you have smoked or chewed Tobacco  in the last 2 yrs please stop smoking, stop any regular Alcohol  and or any Recreational drug use.  Wear Seat belts while driving.  Please note: You were cared for by a hospitalist during your hospital stay. Once you are discharged, your primary care physician will handle any further medical issues. Please note that NO REFILLS for any discharge medications will be authorized once you are discharged, as it is imperative that you return to your primary care physician (or establish a relationship with a primary care physician  if you do not have one) for your post hospital discharge needs so that they can reassess your need for medications and monitor your lab values.     Seizure precautions: Per [facility] statutes, patients with seizures are not allowed to drive until they have been seizure-free for six months and cleared by a physician    Use caution when using heavy equipment or power tools. Avoid working on ladders or at heights. Take showers instead of baths. Ensure the water temperature is not too high on the home water heater. Do not go swimming alone. Do not lock yourself in a room alone (i.e. bathroom). When caring for infants or small children, sit down when holding, feeding, or changing them to minimize risk of injury to the child in the event you have a seizure. Maintain good sleep hygiene. Avoid alcohol.    If patient has another seizure, call 911 and bring them back to the ED if: A.  The seizure lasts longer than 5 minutes.      B.  The patient doesn't wake shortly after the seizure or has new problems such as difficulty seeing, speaking or moving following the seizure C.  The patient was injured during the seizure D.  The patient has a temperature over 102 F (39C) E.  The patient vomited during the seizure and now is having trouble breathing  During the Seizure   - First, ensure adequate ventilation and place patients on the floor on their left side  Loosen clothing around the neck and ensure the airway is patent. If the patient is clenching the teeth, do not force the mouth open with any object as this can cause severe damage - Remove all items from the surrounding that can be hazardous. The patient may be oblivious to what's happening and may not even know what he or she is doing. If the patient is confused and wandering, either gently guide him/her away and block access to outside areas - Reassure the individual and be comforting - Call 911. In most cases, the seizure ends before EMS  arrives. However, there are cases when seizures may last over 3 to 5 minutes. Or the individual may have developed breathing difficulties or severe injuries. If a pregnant patient or a person with diabetes develops a seizure, it is prudent to call an ambulance. - Finally, if the patient does not regain full consciousness, then call EMS. Most patients will remain confused for about 45 to 90 minutes after a seizure, so you must use judgment in calling for help. - Avoid restraints but make sure the patient is in a bed with padded side rails - Place the individual in a lateral position with the neck slightly flexed; this will help the saliva drain from the mouth and prevent the tongue from falling backward - Remove all nearby furniture and other hazards from the area - Provide verbal assurance as the individual is regaining consciousness - Provide the patient with privacy if possible - Call for help and start treatment as ordered by the caregiver    After the Seizure (Postictal Stage)   After a seizure, most patients experience confusion, fatigue, muscle Collins and/or a headache. Thus, one should permit the individual to sleep. For the next few days, reassurance is essential. Being calm and helping reorient the person is also of importance.   Most seizures are painless and end spontaneously. Seizures are not harmful to others but can lead to complications such as stress on the lungs, brain and the heart. Individuals with prior lung problems may develop labored breathing and respiratory distress.    Increase activity slowly   Complete by: As directed       Allergies as of 06/30/2022       Reactions   Amoxicillin Anaphylaxis   Fish Allergy Anaphylaxis   Morphine And Related Anaphylaxis   Penicillins Anaphylaxis   Has patient had a PCN reaction causing immediate rash, facial/tongue/throat swelling, SOB or lightheadedness with hypotension: Yes Has patient had a PCN reaction causing severe rash  involving mucus membranes or skin necrosis: Yes Has patient had a PCN reaction that required hospitalization Yes Has patient had a PCN reaction occurring within the last 10 years: Yes If all of the above answers are "NO", then may proceed with Cephalosporin use.   Rocephin [ceftriaxone Sodium In Dextrose] Anaphylaxis   Shellfish Allergy Anaphylaxis, Itching   Tylenol [acetaminophen] Anaphylaxis   Topamax [topiramate] Nausea And Vomiting   Tramadol Other (See Comments)   Unknown per SO        Medication List     STOP taking these medications    carbamazepine 200 MG tablet Commonly known as: TEGRETOL   cyclobenzaprine 10 MG tablet Commonly known as: FLEXERIL   cyclobenzaprine 7.5 MG tablet Commonly known as: FEXMID   lisinopril-hydrochlorothiazide 20-12.5 MG tablet Commonly known as: Zestoretic  TAKE these medications    Ajovy 225 MG/1.5ML Sosy Generic drug: Fremanezumab-vfrm Inject 225 mg into the muscle every 30 (thirty) days.   albuterol 108 (90 Base) MCG/ACT inhaler Commonly known as: VENTOLIN HFA Inhale 2 puffs into the lungs every 6 (six) hours as needed for wheezing or shortness of breath.   BC FAST Collins RELIEF ARTHRITIS PO Take 1 packet by mouth as needed (Collins).   doxycycline 100 MG tablet Commonly known as: VIBRA-TABS Take 1 tablet (100 mg total) by mouth every 12 (twelve) hours for 2 days.   ibuprofen 200 MG tablet Commonly known as: ADVIL Take 200 mg by mouth as needed (Collins).   levETIRAcetam 750 MG tablet Commonly known as: KEPPRA Take 2 tablets (1,500 mg total) by mouth 2 (two) times daily.   zonisamide 100 MG capsule Commonly known as: ZONEGRAN One capsule in the morning and 2 at night               Durable Medical Equipment  (From admission, onward)           Start     Ordered   06/29/22 1344  For home use only DME Walker rolling  Once       Question Answer Comment  Walker: With 5 Inch Wheels   Patient needs a walker  to treat with the following condition Weakness      06/29/22 1343   06/29/22 1343  For home use only DME Bedside commode  Once       Question:  Patient needs a bedside commode to treat with the following condition  Answer:  Weakness   06/29/22 1343            Follow-up Information     [facility]. Schedule an appointment as soon as possible for a visit.   Specialty: Rehabilitation Why: Call to schedule appointment for physical and occupational therapy Contact information: [address] [id number] [place] [id number] [phone]        Corky DownsMasoud, Javed, MD. Schedule an appointment as soon as possible for a visit in 1 week(s).   Specialties: Internal Medicine, Cardiology Contact information: [address] [place] [id number] [phone]         [facility]. Schedule an appointment as soon as possible for a visit in 1 week(s).   Specialty: Neurology Contact information: [address] [place] [id number] [phone]               Allergies  Allergen Reactions   Amoxicillin Anaphylaxis   Fish Allergy Anaphylaxis   Morphine And Related Anaphylaxis   Penicillins Anaphylaxis    Has patient had a PCN reaction causing immediate rash, facial/tongue/throat swelling, SOB or lightheadedness with hypotension: Yes Has patient had a PCN reaction causing severe rash involving mucus membranes or skin necrosis: Yes Has patient had a PCN reaction that required hospitalization Yes Has patient had a PCN reaction occurring within the last 10 years: Yes If all of the above answers are "NO", then may proceed with Cephalosporin use.    Rocephin [Ceftriaxone Sodium In Dextrose] Anaphylaxis   Shellfish Allergy Anaphylaxis and Itching   Tylenol [Acetaminophen] Anaphylaxis   Topamax [Topiramate] Nausea And Vomiting   Tramadol Other (See Comments)    Unknown per SO      Other Procedures/Studies: VAS US UPPER EXTREMITY VENOUS DUPLEX  Result Date: 06/28/2022 UPPER VENOUS STUDY  Patient Name:  [name]  Date  of Exam:   06/28/2022 Medical Rec #: [id number]       Accession #:    [id number] Date of Birth: 1971/08/10       Patient Gender: F Patient Age:   56 years Exam Location:  [facility] Procedure:      VAS [place] UPPER EXTREMITY VENOUS DUPLEX Referring Phys: Jeoffrey Massed --------------------------------------------------------------------------------  Indications: Collins, Swelling, and Blisters secondary to IV infiltration Limitations: Collins with touch and compression. Comparison Study: No prior study on file Performing Technologist: Sherren Kerns RVS  Examination Guidelines: A complete evaluation includes B-mode imaging, spectral Doppler, color Doppler, and power Doppler as needed of all accessible portions of each vessel. Bilateral testing is considered an integral part of a complete examination. Limited examinations for reoccurring indications may be performed as noted.  Right Findings: +----------+------------+---------+-----------+----------+---------------+ RIGHT     CompressiblePhasicitySpontaneousProperties    Summary     +----------+------------+---------+-----------+----------+---------------+ IJV           Full       Yes       Yes                              +----------+------------+---------+-----------+----------+---------------+ Subclavian               Yes       Yes                              +----------+------------+---------+-----------+----------+---------------+ Axillary                 Yes       Yes                              +----------+------------+---------+-----------+----------+---------------+ Brachial                                            patent by color +----------+------------+---------+-----------+----------+---------------+ Radial        Full                                                   +----------+------------+---------+-----------+----------+---------------+ Ulnar         Full                                                  +----------+------------+---------+-----------+----------+---------------+ Cephalic      Full                                                  +----------+------------+---------+-----------+----------+---------------+ Basilic       Full                                                  +----------+------------+---------+-----------+----------+---------------+  Left Findings: +----------+------------+---------+-----------+----------+-------+ LEFT      CompressiblePhasicitySpontaneousPropertiesSummary +----------+------------+---------+-----------+----------+-------+ Subclavian               Yes       Yes                      +----------+------------+---------+-----------+----------+-------+  Summary:  Right: No evidence of deep vein thrombosis in the upper extremity. No evidence of superficial vein thrombosis in the upper extremity.  Left: No evidence of thrombosis in the subclavian.  *See table(s) above for measurements and observations.  Diagnosing physician: Sherald Hess MD Electronically signed by Sherald Hess MD on 06/28/2022 at 3:45:19 PM.    Final    Overnight EEG with video  Result Date: 06/26/2022 Charlsie Quest, MD     06/26/2022 10:27 AM Patient Name: [name] MRN: [id number] Epilepsy Attending: Charlsie Quest Referring Physician/Provider: Rejeana Brock, MD Duration: 06/25/2022 1032 to 06/26/2022 1000 Patient history: 51 year old female on EEG to evaluate for seizure Level of alertness: Awake, asleep AEDs during EEG study: LEV, ZNS Technical aspects: This EEG study was done with scalp electrodes positioned according to the 10-20 International system of electrode placement. Electrical activity was reviewed with band pass filter of 1-70Hz , sensitivity of 7 uV/mm, display speed of 106mm/sec with a 60Hz   notched filter applied as appropriate. EEG data were recorded continuously and digitally stored.  Video monitoring was available and reviewed as appropriate. Description: During awake state, no clear posterior dominant rhythm was seen.  EEG showed continuous generalized and maximal right posterior quadrant 3-7 Hz theta-delta slowing admixed with 12 to 14 Hz generalized beta activity.  Sleep was characterized by vertex waves, sleep spindles (12 to 14 Hz), maximal frontocentral region. Hyperventilation and photic stimulation were not performed.   ABNORMALITY - Continuous slow, generalized and maximal right posterior quadrant IMPRESSION: This study is suggestive of cortical dysfunction in right posterior quadrant likely secondary to underlying stroke as well as moderate diffuse encephalopathy, nonspecific to etiology.  No seizures or epileptiform discharges were seen throughout the recording. Priyanka   Rapid EEG  Result Date: 06/25/2022 06/27/2022, MD     06/25/2022  8:21 AM Patient Name: [name] MRN: Hannah Cao Epilepsy Attending: [id number] Referring Physician/Provider: Charlsie Quest, MD Duration: 06/25/2022 0310 to 06/27/2022 Patient history: 51 year old female getting rapid EEG due to concern for seizures. Level of alertness: comatose AEDs during EEG study: LEV, versed, propofol Technical aspects: This EEG was obtained using a 10 lead EEG system positioned circumferentially without any parasagittal coverage (rapid EEG). Computer selected EEG is reviewed as  well as background features and all clinically significant events. Description: EEG showed continuous generalized 2 to 3 Hz delta slowing with overriding excessive amount of 15 to 18 Hz beta activity distributed symmetrically and diffusely.   Hyperventilation and photic stimulation were not performed.   ABNORMALITY - Continuous slow, generalized - Excessive beta, generalized IMPRESSION: This rapid ceribell EEG is suggestive of  severe diffuse encephalopathy, nonspecific etiology but likely related to sedation. No seizures or epileptiform discharges were seen throughout the recording. If suspicion for interictal activity remains a concern, a conventional EEG can be considered. 44   Portable Chest xray  Result Date: 06/25/2022 CLINICAL DATA:  Endotracheal tube placement. EXAM: PORTABLE CHEST 1 VIEW COMPARISON:  Chest radiograph 1 day prior. FINDINGS: The endotracheal tube tip is proximally 1.5 cm from the carina. Enteric catheter tip is off the field of view.  The side-hole projects over the stomach. The cardiomediastinal silhouette is stable. Aeration of the lungs is unchanged. There is no pleural effusion or pneumothorax The bones are stable. IMPRESSION: Endotracheal tube tip approximately 1.5 cm from the carina. Electronically Signed   By: Lesia Hausen M.D.   On: 06/25/2022 08:04   DG Abd Portable 1V  Result Date: 06/24/2022 CLINICAL DATA:  NG tube placement. EXAM: PORTABLE ABDOMEN - 1 VIEW COMPARISON:  Earlier same day abdominal radiograph at 1406 hours FINDINGS: Nasogastric tube tip and side hole overlie the expected location of the gastric body. Relative paucity of bowel gas in the visualized upper and mid abdomen. IMPRESSION: Nasogastric tube tip and side hole overlie the expected location of the gastric body. Electronically Signed   By: Sherron Ales M.D.   On: 06/24/2022 21:21   Portable Chest x-ray  Result Date: 06/24/2022 CLINICAL DATA:  Endotracheal tube EXAM: PORTABLE CHEST 1 VIEW COMPARISON:  Chest x-ray 06/24/2022 FINDINGS: Endotracheal tube tip is 1.3 cm above the carina. Cardiomediastinal silhouette is within normal limits. The lungs are clear. There is no pleural effusion or pneumothorax. Enteric tube extends below the diaphragm, distal tip is not included on this image. IMPRESSION: 1. Endotracheal tube tip 1.3 cm above carina. 2. No other acute cardiopulmonary process. Electronically Signed   By:  Darliss Cheney M.D.   On: 06/24/2022 18:30   CT ANGIO HEAD NECK W WO CM W PERF (CODE STROKE)  Result Date: 06/24/2022 CLINICAL DATA:  Neuro deficit, stroke suspected EXAM: CT ANGIOGRAPHY HEAD AND NECK CT PERFUSION BRAIN TECHNIQUE: Multidetector CT imaging of the head and neck was performed using the standard protocol during bolus administration of intravenous contrast. Multiplanar CT image reconstructions and MIPs were obtained to evaluate the vascular anatomy. Carotid stenosis measurements (when applicable) are obtained utilizing NASCET criteria, using the distal internal carotid diameter as the denominator. Multiphase CT imaging of the brain was performed following IV bolus contrast injection. Subsequent parametric perfusion maps were calculated using RAPID software. RADIATION DOSE REDUCTION: This exam was performed according to the departmental dose-optimization program which includes automated exposure control, adjustment of the mA and/or kV according to patient size and/or use of iterative reconstruction technique. CONTRAST:  OMNIPAQUE IOHEXOL 350 MG/ML SOLN COMPARISON:  Same-day noncontrast CT head, brain MRI 03/21/2021, CTA head 08/31/2014 common carotid Doppler 04/07/2013 FINDINGS: CTA NECK FINDINGS Aortic arch: The imaged aortic arch is normal. The origins of the major branch vessels are patent. The subclavian arteries are patent to the level imaged. Right carotid system: The right common, internal, and external carotid arteries are patent, without hemodynamically significant stenosis or occlusion. There is no dissection or aneurysm. Left carotid system: The left common, internal, and external carotid arteries are patent, without hemodynamically significant stenosis or occlusion. There is no dissection or aneurysm. Vertebral arteries: The vertebral arteries are patent, without hemodynamically significant stenosis or occlusion. There is no dissection or aneurysm. Skeleton: There is no acute osseous  abnormality or suspicious osseous lesion. There is no visible canal hematoma. Other neck: Endotracheal and enteric tubes are partially imaged. Fluid throughout the upper aerodigestive tract is likely related to instrumentation. The soft tissues of the neck are unremarkable. Upper chest: There is mucoid impaction in some right upper lobe airways without postobstructive atelectasis. The imaged lung apices are otherwise clear. Review of the MIP images confirms the above findings CTA HEAD FINDINGS Anterior circulation: The intracranial ICAs are patent. The bilateral MCAs are patent. The bilateral ACAs are patent. The anterior communicating  artery is normal. There is no aneurysm or AVM. Posterior circulation: The bilateral V4 segments are patent. The basilar artery is patent with a small fenestration proximally. The major cerebellar artery origins are patent. The bilateral PCAs are patent. The posterior communicating arteries are not seen. There is no aneurysm or AVM. Venous sinuses: Not well evaluated due to bolus timing. Anatomic variants: None. Review of the MIP images confirms the above findings CT Brain Perfusion Findings: ASPECTS: 10 CBF (<30%) Volume: 81mL Perfusion (Tmax>6.0s) volume: 1mL Mismatch Volume: 13mL Infarction Location:N/a IMPRESSION: 1. Patent vasculature of the head and neck with no hemodynamically significant stenosis, occlusion, or dissection. 2. No infarct core or penumbra identified on CT perfusion. 3. Mucoid impaction in some right upper lobe airways without postobstructive atelectasis. Electronically Signed   By: Lesia Hausen M.D.   On: 06/24/2022 15:44   DG Abd Portable 1 View  Result Date: 06/24/2022 CLINICAL DATA:  OG tube placement EXAM: PORTABLE ABDOMEN - 1 VIEW COMPARISON:  None Available. FINDINGS: Orogastric tube tip and side port overlie the stomach. Nonobstructive bowel gas pattern of the upper abdomen. There are punctate densities overlying the left hemiabdomen, likely external to  patient. IMPRESSION: Orogastric tube tip and side port overlie the stomach. Electronically Signed   By: Caprice Renshaw M.D.   On: 06/24/2022 14:20   DG Chest Portable 1 View  Result Date: 06/24/2022 CLINICAL DATA:  Post intubation EXAM: PORTABLE CHEST 1 VIEW COMPARISON:  Radiograph 07/09/2018 FINDINGS: Endotracheal tube tip overlies the distal trachea approximately 1.9 cm above the carina. Mildly enlarged cardiac silhouette, likely accentuated by technique. No focal airspace consolidation. No large pleural effusion. No pneumothorax. No acute osseous abnormality. IMPRESSION: Endotracheal tube overlies the distal trachea approximately 1.9 cm above the carina. No evidence of acute cardiopulmonary disease. Electronically Signed   By: Caprice Renshaw M.D.   On: 06/24/2022 14:16   CT HEAD CODE STROKE WO CONTRAST  Result Date: 06/24/2022 CLINICAL DATA:  Code stroke.  Acute neuro deficit. EXAM: CT HEAD WITHOUT CONTRAST TECHNIQUE: Contiguous axial images were obtained from the base of the skull through the vertex without intravenous contrast. RADIATION DOSE REDUCTION: This exam was performed according to the departmental dose-optimization program which includes automated exposure control, adjustment of the mA and/or kV according to patient size and/or use of iterative reconstruction technique. COMPARISON:  CT head 02/11/2022 FINDINGS: Brain: No evidence of acute infarction, hemorrhage, hydrocephalus, extra-axial collection or mass lesion/mass effect. Vascular: Negative for hyperdense vessel Skull: Negative Sinuses/Orbits: Paranasal sinuses clear.  Negative orbit Other: None ASPECTS ([place]berta Stroke Program Early CT Score) - Ganglionic level infarction (caudate, lentiform nuclei, internal capsule, insula, M1-M3 cortex): 7 - Supraganglionic infarction (M4-M6 cortex): 3 Total score (0-10 with 10 being normal): 10 IMPRESSION: 1. Negative CT head 2. ASPECTS is 10 3. Code stroke imaging results were communicated on 06/24/2022 at  1:15 pm to provider Selina Cooley via [facility] page Electronically Signed   By: Marlan Palau M.D.   On: 06/24/2022 13:15     TODAY-DAY OF DISCHARGE:  Subjective:   [name] today has no headache,no chest abdominal Collins,no new weakness tingling or numbness, feels much better wants to go home today.  Objective:   Blood pressure 114/73, pulse 81, temperature 97.9 F (36.6 C), temperature source Oral, resp. rate 16, height 5\' 1"  (1.549 m), weight 101.2 kg, SpO2 98 %. No intake or output data in the 24 hours ending 06/30/22 0944 Filed Weights   06/24/22 1800  Weight: 101.2 kg  Exam: Awake Alert, Oriented *3, No new F.N deficits, Normal affect [place].AT,PERRAL Supple Neck,No JVD, No cervical lymphadenopathy appriciated.  Symmetrical Chest wall movement, Good air movement bilaterally, CTAB RRR,No Gallops,Rubs or new Murmurs, No Parasternal Heave +ve B.Sounds, Abd Soft, Non tender, No organomegaly appriciated, No rebound -guarding or rigidity. No Cyanosis, Clubbing or edema, No new Rash or bruise   PERTINENT RADIOLOGIC STUDIES: VAS [place] UPPER EXTREMITY VENOUS DUPLEX  Result Date: 06/28/2022 UPPER VENOUS STUDY  Patient Name:  ANDEE CHIVERS  Date of Exam:   06/28/2022 Medical Rec #: [id number]       Accession #:    [id number] Date of Birth: July 06, 1971       Patient Gender: F Patient Age:   37 years Exam Location:  [facility] Procedure:      VAS [place] UPPER EXTREMITY VENOUS DUPLEX Referring Phys: Jeoffrey Massed --------------------------------------------------------------------------------  Indications: Collins, Swelling, and Blisters secondary to IV infiltration Limitations: Collins with touch and compression. Comparison Study: No prior study on file Performing Technologist: Sherren Kerns RVS  Examination Guidelines: A complete evaluation includes B-mode imaging, spectral Doppler, color Doppler, and power Doppler as needed of all accessible portions of each vessel. Bilateral testing is considered an  integral part of a complete examination. Limited examinations for reoccurring indications may be performed as noted.  Right Findings: +----------+------------+---------+-----------+----------+---------------+ RIGHT     CompressiblePhasicitySpontaneousProperties    Summary     +----------+------------+---------+-----------+----------+---------------+ IJV           Full       Yes       Yes                              +----------+------------+---------+-----------+----------+---------------+ Subclavian               Yes       Yes                              +----------+------------+---------+-----------+----------+---------------+ Axillary                 Yes       Yes                              +----------+------------+---------+-----------+----------+---------------+ Brachial                                            patent by color +----------+------------+---------+-----------+----------+---------------+ Radial        Full                                                  +----------+------------+---------+-----------+----------+---------------+ Ulnar         Full                                                  +----------+------------+---------+-----------+----------+---------------+ Cephalic      Full                                                  +----------+------------+---------+-----------+----------+---------------+  Basilic       Full                                                  +----------+------------+---------+-----------+----------+---------------+  Left Findings: +----------+------------+---------+-----------+----------+-------+ LEFT      CompressiblePhasicitySpontaneousPropertiesSummary +----------+------------+---------+-----------+----------+-------+ Subclavian               Yes       Yes                      +----------+------------+---------+-----------+----------+-------+  Summary:  Right: No evidence of deep  vein thrombosis in the upper extremity. No evidence of superficial vein thrombosis in the upper extremity.  Left: No evidence of thrombosis in the subclavian.  *See table(s) above for measurements and observations.  Diagnosing physician: Sherald Hess MD Electronically signed by Sherald Hess MD on 06/28/2022 at 3:45:19 PM.    Final      PERTINENT LAB RESULTS: CBC: Recent Labs    06/28/22 0329 06/29/22 0323  WBC 13.5* 14.3*  HGB 11.9* 12.0  HCT 36.7 37.7  PLT 315 338   CMET CMP     Component Value Date/Time   NA 143 06/29/2022 0323   NA 145 08/29/2014 1556   K 4.1 06/29/2022 0323   K 4.0 08/29/2014 1556   CL 111 06/29/2022 0323   CL 113 (H) 08/29/2014 1556   CO2 21 (L) 06/29/2022 0323   CO2 24 08/29/2014 1556   GLUCOSE 113 (H) 06/29/2022 0323   GLUCOSE 87 08/29/2014 1556   BUN 12 06/29/2022 0323   BUN 9 08/29/2014 1556   CREATININE 0.84 06/29/2022 0323   CREATININE 0.74 08/29/2014 1556   CALCIUM 9.1 06/29/2022 0323   CALCIUM 8.5 08/29/2014 1556   PROT 7.1 06/24/2022 1300   PROT 7.9 02/01/2014 1605   ALBUMIN 3.8 06/24/2022 1300   ALBUMIN 4.0 02/01/2014 1605   AST 31 06/24/2022 1300   AST 20 02/01/2014 1605   ALT 29 06/24/2022 1300   ALT 30 02/01/2014 1605   ALKPHOS 88 06/24/2022 1300   ALKPHOS 74 02/01/2014 1605   BILITOT 0.7 06/24/2022 1300   BILITOT 0.4 02/01/2014 1605   GFRNONAA >60 06/29/2022 0323   GFRNONAA >60 08/29/2014 1556   GFRNONAA >60 02/01/2014 1605   GFRAA >60 06/02/2020 1444   GFRAA >60 08/29/2014 1556   GFRAA >60 02/01/2014 1605    GFR Estimated Creatinine Clearance: 87.5 mL/min (by C-G formula based on SCr of 0.84 mg/dL). No results for input(s): "LIPASE", "AMYLASE" in the last 72 hours. No results for input(s): "CKTOTAL", "CKMB", "CKMBINDEX", "TROPONINI" in the last 72 hours. Invalid input(s): "POCBNP" No results for input(s): "DDIMER" in the last 72 hours. No results for input(s): "HGBA1C" in the last 72 hours. No results for  input(s): "CHOL", "HDL", "LDLCALC", "TRIG", "CHOLHDL", "LDLDIRECT" in the last 72 hours. No results for input(s): "TSH", "T4TOTAL", "T3FREE", "THYROIDAB" in the last 72 hours.  Invalid input(s): "FREET3" No results for input(s): "VITAMINB12", "FOLATE", "FERRITIN", "TIBC", "IRON", "RETICCTPCT" in the last 72 hours. Coags: No results for input(s): "INR" in the last 72 hours.  Invalid input(s): "PT" Microbiology: Recent Results (from the past 240 hour(s))  Urine Culture     Status: None   Collection Time: 06/24/22  4:06 PM   Specimen: Urine, Random  Result Value Ref Range Status   Specimen Description  Final    URINE, RANDOM Performed at [facility], [address], [place], [place] [address]    Special Requests   Final    NONE Performed at [facility], [address] [place], [place] [address]    Culture   Final    NO GROWTH Performed at [facility], [address], [place], [place] [address]    Report Status 06/25/2022 FINAL  Final  MRSA Next Gen by PCR, Nasal     Status: None   Collection Time: 06/24/22  6:25 PM   Specimen: Nasal Mucosa; Nasal Swab  Result Value Ref Range Status   MRSA by PCR Next Gen NOT DETECTED NOT DETECTED Final    Comment: (NOTE) The GeneXpert MRSA Assay (FDA approved for NASAL specimens only), is one component of a comprehensive MRSA colonization surveillance program. It is not intended to diagnose MRSA infection nor to guide or monitor treatment for MRSA infections. Test performance is not FDA approved in patients less than 56 years old. Performed at [facility], [address], [place], [place] [address]     FURTHER DISCHARGE INSTRUCTIONS:  Get Medicines reviewed and adjusted: Please take all your medications with you for your next visit with your Primary MD  Laboratory/radiological data: Please request your Primary MD to go over all hospital tests and procedure/radiological results at the follow up,  please ask your Primary MD to get all Hospital records sent to his/her office.  In some cases, they will be blood work, cultures and biopsy results pending at the time of your discharge. Please request that your primary care M.D. goes through all the records of your hospital data and follows up on these results.  Also Note the following: If you experience worsening of your admission symptoms, develop shortness of breath, life threatening emergency, suicidal or homicidal thoughts you must seek medical attention immediately by calling 911 or calling your MD immediately  if symptoms less severe.  You must read complete instructions/literature along with all the possible adverse reactions/side effects for all the Medicines you take and that have been prescribed to you. Take any new Medicines after you have completely understood and accpet all the possible adverse reactions/side effects.   Do not drive when taking Collins medications or sleeping medications (Benzodaizepines)  Do not take more than prescribed Collins, Sleep and Anxiety Medications. It is not advisable to combine anxiety,sleep and Collins medications without talking with your primary care practitioner  Special Instructions: If you have smoked or chewed Tobacco  in the last 2 yrs please stop smoking, stop any regular Alcohol  and or any Recreational drug use.  Wear Seat belts while driving.  Please note: You were cared for by a hospitalist during your hospital stay. Once you are discharged, your primary care physician will handle any further medical issues. Please note that NO REFILLS for any discharge medications will be authorized once you are discharged, as it is imperative that you return to your primary care physician (or establish a relationship with a primary care physician if you do not have one) for your post hospital discharge needs so that they can reassess your need for medications and monitor your lab values.  Total Time spent  coordinating discharge including counseling, education and face to face time equals greater than 30 minutes.  SignedJeoffrey Massed 06/30/2022 9:44 AM

## 2022-06-30 NOTE — Plan of Care (Signed)
  Problem: Education: Goal: Expressions of having a comfortable level of knowledge regarding the disease process will increase Outcome: Progressing   Problem: Coping: Goal: Ability to adjust to condition or change in health will improve Outcome: Progressing Goal: Ability to identify appropriate support needs will improve Outcome: Progressing   Problem: Health Behavior/Discharge Planning: Goal: Compliance with prescribed medication regimen will improve Outcome: Progressing   Problem: Medication: Goal: Risk for medication side effects will decrease Outcome: Progressing   Problem: Clinical Measurements: Goal: Diagnostic test results will improve Outcome: Progressing   Problem: Safety: Goal: Verbalization of understanding the information provided will improve Outcome: Progressing   Problem: Self-Concept: Goal: Level of anxiety will decrease Outcome: Progressing

## 2022-06-30 NOTE — Discharge Instructions (Signed)
Seizure precautions: °Per [place] DMV statutes, patients with seizures are not allowed to drive until they have been seizure-free for six months and cleared by a physician  °  °Use caution when using heavy equipment or power tools. Avoid working on ladders or at heights. Take showers instead of baths. Ensure the water temperature is not too high on the home water heater. Do not go swimming alone. Do not lock yourself in a room alone (i.e. bathroom). When caring for infants or small children, sit down when holding, feeding, or changing them to minimize risk of injury to the child in the event you have a seizure. Maintain good sleep hygiene. Avoid alcohol.  °  °If patient has another seizure, call 911 and bring them back to the ED if: °A.  The seizure lasts longer than 5 minutes.      °B.  The patient doesn't wake shortly after the seizure or has new problems such as difficulty seeing, speaking or moving following the seizure °C.  The patient was injured during the seizure °D.  The patient has a temperature over 102 F (39C) °E.  The patient vomited during the seizure and now is having trouble breathing °   °During the Seizure °  °- First, ensure adequate ventilation and place patients on the floor on their left side  °Loosen clothing around the neck and ensure the airway is patent. If the patient is clenching the teeth, do not force the mouth open with any object as this can cause severe damage °- Remove all items from the surrounding that can be hazardous. The patient may be oblivious to what's happening and may not even know what he or she is doing. °If the patient is confused and wandering, either gently guide him/her away and block access to outside areas °- Reassure the individual and be comforting °- Call 911. In most cases, the seizure ends before EMS arrives. However, there are cases when seizures may last over 3 to 5 minutes. Or the individual may have developed breathing difficulties or severe  injuries. If a pregnant patient or a person with diabetes develops a seizure, it is prudent to call an ambulance. °- Finally, if the patient does not regain full consciousness, then call EMS. Most patients will remain confused for about 45 to 90 minutes after a seizure, so you must use judgment in calling for help. °- Avoid restraints but make sure the patient is in a bed with padded side rails °- Place the individual in a lateral position with the neck slightly flexed; this will help the saliva drain from the mouth and prevent the tongue from falling backward °- Remove all nearby furniture and other hazards from the area °- Provide verbal assurance as the individual is regaining consciousness °- Provide the patient with privacy if possible °- Call for help and start treatment as ordered by the caregiver °  ° After the Seizure (Postictal Stage) °  °After a seizure, most patients experience confusion, fatigue, muscle pain and/or a headache. Thus, one should permit the individual to sleep. For the next few days, reassurance is essential. Being calm and helping reorient the person is also of importance. °  °Most seizures are painless and end spontaneously. Seizures are not harmful to others but can lead to complications such as stress on the lungs, brain and the heart. Individuals with prior lung problems may develop labored breathing and respiratory distress.  °  °

## 2022-07-04 ENCOUNTER — Other Ambulatory Visit: Payer: Self-pay | Admitting: *Deleted

## 2022-07-04 DIAGNOSIS — Z1231 Encounter for screening mammogram for malignant neoplasm of breast: Secondary | ICD-10-CM

## 2022-07-08 ENCOUNTER — Ambulatory Visit: Payer: Commercial Managed Care - PPO | Attending: Internal Medicine

## 2022-07-08 DIAGNOSIS — R2681 Unsteadiness on feet: Secondary | ICD-10-CM | POA: Diagnosis present

## 2022-07-08 DIAGNOSIS — R262 Difficulty in walking, not elsewhere classified: Secondary | ICD-10-CM | POA: Insufficient documentation

## 2022-07-08 DIAGNOSIS — M79602 Pain in left arm: Secondary | ICD-10-CM | POA: Insufficient documentation

## 2022-07-08 DIAGNOSIS — M6281 Muscle weakness (generalized): Secondary | ICD-10-CM | POA: Insufficient documentation

## 2022-07-08 DIAGNOSIS — M79605 Pain in left leg: Secondary | ICD-10-CM | POA: Diagnosis present

## 2022-07-08 DIAGNOSIS — R278 Other lack of coordination: Secondary | ICD-10-CM | POA: Insufficient documentation

## 2022-07-08 NOTE — Therapy (Signed)
OUTPATIENT OCCUPATIONAL THERAPY NEURO EVALUATION  Patient Name: [name] MRN: [id number] DOB:17-Oct-1971, 51 y.o., female Today's Date: 07/08/2022  PCP: Dr. Corky Downs REFERRING PROVIDER: Dr. Corky Downs   OT End of Session - 07/08/22 1610     Visit Number 1    Number of Visits 24    Date for OT Re-Evaluation 09/30/22    Authorization Time Period Reporting period beginning 07/08/22    OT Start Time 1505    OT Stop Time 1558    OT Time Calculation (min) 53 min    Activity Tolerance Patient limited by pain    Behavior During Therapy [facility] for tasks assessed/performed   occasionally tearful            Past Medical History:  Diagnosis Date   Brain aneurysm    Common migraine with intractable migraine 03/03/2017   Headache    Hemiplegic migraine 10/07/2015   Late effects of cerebral ischemic stroke 02/09/2018   Left hemiparesis   Primary hypertension 12/06/2020   Seizures (HCC)    Past Surgical History:  Procedure Laterality Date   ABDOMINAL HYSTERECTOMY     Patient Active Problem List   Diagnosis Date Noted   Acute respiratory failure (HCC)    Leukocytosis    Status epilepticus (HCC) 06/24/2022   History of hemorrhagic stroke with residual hemiparesis (HCC) 01/27/2021   Prediabetes 12/06/2020   Obesity (BMI 30.0-34.9) 12/06/2020   Chronic pain 08/09/2020   Headache    Hx of cerebral infarction 06/24/2020   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 Pt accompanied by: self and family member (daughter and granddaughter)  PERTINENT HISTORY: Per chart, patient is a 51 y.o.  female with history of right parietal-occipital ICH 2009-seizure disorder, HTN-presented with status epilepticus with Todd's paresis.  Patient was intubated on presentation for airway protection-admitted to the ICU-neurology consulted-antiepileptics adjusted-upon stability-transfer to [facility] service.  PRECAUTIONS: Other: hx of seizures  WEIGHT BEARING RESTRICTIONS No, but pt self limits WB to L hand d/t severe pain in L wrist with any mobility or pressure  PAIN:  Are you having pain? Yes: NPRS scale: 8-10/10 Pain location: R arm from burn, LUE chronic pain since recent hospitalization, and L Leg chronic pain Pain description: sharp in leg, achy in LUE, burning in R arm Aggravating factors: lifting leg, R arm constant from burn, LUE mobility Relieving factors: heat/ice, shifting from side to side , rest  FALLS: Has patient fallen in last 6 months? 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  OBJECTIVE:   HAND DOMINANCE: Right  ADLs: Overall ADLs: Since seizure, family assists with all self care  d/t extreme fatigue and chronic LUE pain/LLE pain, R forearm pain, L sided weakness, and decreased coord. Transfers/ambulation related to ADLs: limited community ambulation d/t fatigue  Eating: able to cut own food Grooming: daughter currently assist to wash and manage hair d/t pain and weakness in BUEs UB Dressing: spouse hooks bra; pt can pull a shirt overhead  with the RUE.  LB Dressing: spouse assists with socks/shoes (limited by fatigue) Toileting: modified indep (pushes up from sink countertop or uses BSC) Bathing: Pt reports her daughter bathes her.  Bathing causes extreme fatigue and pt prefers to sit down and soak in the tub d/t limited standing tolerance.  (AE recommendations made; see narrative for details) Tub Shower transfers: mod A tub transfer  Equipment:  RW   IADLs: indep at baseline Shopping: kids managing Light housekeeping: kids managing Meal Prep: kids managing  Community mobility: limited  Medication management: oldest daughter sets pills out for pt  Financial management: spouse manages at baseline Handwriting: no changes; pt states comparable to baseline  MOBILITY STATUS:  limited tolerance for community ambulation; pt states she drags LLE.  Pt has a RW but states she does not use it much because she wants to be able to swing her L arm a little.    POSTURE COMMENTS:  Pt keeps LUE guarded to abdomen for much of assessment d/t pain throughout the LUE.   Rounded shoulders. Sitting balance: Moves/returns truncal midpoint >2 inches in all planes  ACTIVITY TOLERANCE: Activity tolerance: 10-15 min before rest break needed; RPE scale: 7-8  (vigorous activity) for bathing in the tub, 9 (very hard activity) to stand for a shower.  FUNCTIONAL OUTCOME MEASURES: FOTO: 52  UPPER EXTREMITY ROM     Active ROM Right eval Left eval  Shoulder flexion WNL 90 (90)  Shoulder abduction WNL NT d/t severe pain after lifting arm into flexion  Shoulder adduction    Shoulder extension    Shoulder internal rotation    Shoulder external rotation  NT (limited by pain)  Elbow flexion  WNL  Elbow extension  WNL  Wrist flexion  WNL (painful)  Wrist extension  WNL (painful)  (Blank rows = not tested)   UPPER EXTREMITY MMT:    RUE 5/5; deferred LUE MMT d/t significant pain just with attempts at AROM   HAND FUNCTION: Grip strength:  Right: 71 lbs; Left: 16 lbs, Lateral pinch: Right: 19 lbs, Left: 8 lbs, and 3 point pinch: Right: 15 lbs, Left: 7 lbs  COORDINATION: Finger Nose Finger test: mildy impaired LUE 9 Hole Peg test: Right: 24 sec; Left: 40 sec  SENSATION: Light touch: Impaired tingling/numbness sensation throughout LUE  EDEMA: none  MUSCLE TONE: LUE: Within functional limits  COGNITION: Overall cognitive status: Impaired ; pt reports her days are still confused; memory loss from hospital   VISION: Subjective report: No changes since hospitalization Baseline vision: Wears glasses for reading only Visual history: N/A  PERCEPTION: WFL  PRAXIS: Impaired: Motor planning (LUE)  OBSERVATIONS: LUE movements are very antalgic; pt tends to jerk when attempting movements but jerking appears more related to sudden pain rather than myoclonus.  Pt is intermittently tearful throughout eval d/t pain throughout the LUE, as well as feeling disappointment with her weak hand during grip and pinch testing.   TODAY'S TREATMENT:  Eval completed.  Objective measures taken and goals established.    Self Care: Provided education on bathroom DME for purposes of EC and fall prevention.  Made recommendation for tub bar for edge  of tub when tub bathing, and shower chair with back rest vs transfer tub bench when showering with OT providing education on benefits of either piece of DME.  Educated on options to obtain.  Pt/daughter both receptive to all recommendations.  PATIENT EDUCATION: Education details: OT role, goals, poc, DME for bathing; table slides for gentle shoulder ROM Person educated: Patient and Child(ren) Education method: Explanation and Verbal cues, demo Education comprehension: verbalized understanding and needs further education   HOME EXERCISE PROGRAM: Instructed briefly in table slides for L shoulder mobility; pt able to return demo.  Encouraged pt complete within a tolerable ROM.  Pt verbalized  understanding.    GOALS: Goals reviewed with patient? Yes  SHORT TERM GOALS: Target date: 08/19/22  Pt will be indep to perform HEP for increasing LUE strength and coordination.  Baseline: Eval: initiated table slides; will progress with additional visits Goal status: INITIAL  2.  Pt will tolerate manual therapy, therapeutic exercises, and modalities as needed for decreasing pain throughout the LUE to <5/10 with activity Baseline: Eval 8-10/10 pain in LUE with activity Goal status: INITIAL  LONG TERM GOALS: Target date: 09/30/22  Pt will increase FOTO score to 60 or greater to indicate increase in perceived performance with daily tasks.  Baseline: Eval: 52 Goal status: INITIAL  2.  Pt will increase L grip strength by 20 or more lbs to enable pt to hold and carry ADL supplies without dropping. Baseline: Eval: L grip 16 (R 71); pt uses R hand to hold and carry ADL supplies Goal status: INITIAL  3.  Pt will increase L shoulder flexibility to enable reaching into overhead cupboards.  Baseline: Eval: L active shoulder flex 90 degrees (passive also 90) Goal status: INITIAL  4.  Pt will increase L hand dexterity to be able to hook her bra. Baseline: Eval: spouse hooks bra Goal status: INITIAL  ASSESSMENT:  CLINICAL IMPRESSION: Patient is a 51 y.o. female who was seen today for occupational therapy evaluation for functional decline status epilepticus and intubation.  Pt severely limited in her ADLs d/t extreme fatigue, LUE weakness, severe pain throughout the LUE, and decreased coordination.  Family is very supportive and pt is eager to maximize her indep with daily tasks.  Pt will benefit from skilled OT to address above noted deficits for maximizing indep with daily tasks.   PERFORMANCE DEFICITS in functional skills including ADLs, IADLs, coordination, dexterity, sensation, ROM, strength, pain, flexibility, FMC, GMC, mobility, balance, endurance, decreased knowledge of use of DME,  skin integrity, and UE functional use, cognitive skills including memory and attention.  IMPAIRMENTS are limiting patient from ADLs, IADLs, work, and leisure.   COMORBIDITIES may have co-morbidities  that affects occupational performance. Patient will benefit from skilled OT to address above impairments and improve overall function.  MODIFICATION OR ASSISTANCE TO COMPLETE EVALUATION: Min-Moderate modification of tasks or assist with assess necessary to complete an evaluation.  OT OCCUPATIONAL PROFILE AND HISTORY: Problem focused assessment: Including review of records relating to presenting problem.  CLINICAL DECISION MAKING: Moderate - several treatment options, min-mod task modification necessary  REHAB POTENTIAL: Good  EVALUATION COMPLEXITY: Moderate    PLAN: OT FREQUENCY: 2x/week  OT DURATION: 12 weeks  PLANNED INTERVENTIONS: self care/ADL training, therapeutic exercise, therapeutic activity, neuromuscular re-education, manual therapy, passive range of motion, balance training, functional mobility training, paraffin, moist heat, cryotherapy, contrast bath, patient/family education, cognitive remediation/compensation, and DME and/or AE instructions  RECOMMENDED OTHER SERVICES: Pt has referral for PT  CONSULTED AND AGREED  WITH PLAN OF CARE: Patient and family member/caregiver  PLAN FOR NEXT SESSION: see poc  Danelle Earthly, MS, OTR/L  Otis Dials, OT 07/08/2022, 4:13 PM

## 2022-07-09 ENCOUNTER — Ambulatory Visit: Payer: Commercial Managed Care - PPO

## 2022-07-13 ENCOUNTER — Ambulatory Visit (INDEPENDENT_AMBULATORY_CARE_PROVIDER_SITE_OTHER): Payer: Commercial Managed Care - PPO | Admitting: Internal Medicine

## 2022-07-13 ENCOUNTER — Encounter: Payer: Self-pay | Admitting: Internal Medicine

## 2022-07-13 ENCOUNTER — Ambulatory Visit: Payer: Commercial Managed Care - PPO

## 2022-07-13 VITALS — BP 129/88 | HR 91 | Ht 61.0 in | Wt 213.3 lb

## 2022-07-13 DIAGNOSIS — Z8673 Personal history of transient ischemic attack (TIA), and cerebral infarction without residual deficits: Secondary | ICD-10-CM

## 2022-07-13 DIAGNOSIS — R569 Unspecified convulsions: Secondary | ICD-10-CM

## 2022-07-13 DIAGNOSIS — R278 Other lack of coordination: Secondary | ICD-10-CM

## 2022-07-13 DIAGNOSIS — G40901 Epilepsy, unspecified, not intractable, with status epilepticus: Secondary | ICD-10-CM

## 2022-07-13 DIAGNOSIS — R079 Chest pain, unspecified: Secondary | ICD-10-CM

## 2022-07-13 DIAGNOSIS — M79602 Pain in left arm: Secondary | ICD-10-CM

## 2022-07-13 DIAGNOSIS — I119 Hypertensive heart disease without heart failure: Secondary | ICD-10-CM | POA: Diagnosis not present

## 2022-07-13 DIAGNOSIS — Z1211 Encounter for screening for malignant neoplasm of colon: Secondary | ICD-10-CM

## 2022-07-13 DIAGNOSIS — E669 Obesity, unspecified: Secondary | ICD-10-CM

## 2022-07-13 DIAGNOSIS — G4489 Other headache syndrome: Secondary | ICD-10-CM

## 2022-07-13 DIAGNOSIS — Z23 Encounter for immunization: Secondary | ICD-10-CM

## 2022-07-13 DIAGNOSIS — M6281 Muscle weakness (generalized): Secondary | ICD-10-CM | POA: Diagnosis not present

## 2022-07-13 DIAGNOSIS — I611 Nontraumatic intracerebral hemorrhage in hemisphere, cortical: Secondary | ICD-10-CM

## 2022-07-13 DIAGNOSIS — G43419 Hemiplegic migraine, intractable, without status migrainosus: Secondary | ICD-10-CM

## 2022-07-13 NOTE — Assessment & Plan Note (Signed)
Pt has weakness of lt side

## 2022-07-13 NOTE — Assessment & Plan Note (Signed)
Lose wt 

## 2022-07-13 NOTE — Assessment & Plan Note (Signed)
Under controll 

## 2022-07-13 NOTE — Assessment & Plan Note (Signed)
Under control 

## 2022-07-13 NOTE — Assessment & Plan Note (Signed)
bp is stable  

## 2022-07-13 NOTE — Progress Notes (Signed)
Established Patient Office Visit  Subjective:  Patient ID: [name], female    DOB: [birth year]  Age: 51 y.o. MRN: [id number]  CC:  Chief Complaint  Patient presents with   Chest Pain    Patient has been having sharp chest pain with sob on and off since early august.     Chest Pain  This is a recurrent problem. The current episode started more than 1 year ago. The problem occurs intermittently. The problem has been gradually improving. The pain is at a severity of 8/10. The pain is severe. The quality of the pain is described as sharp and stabbing. The pain radiates to the left arm, left shoulder and left neck. Associated symptoms include back pain, diaphoresis, dizziness, headaches, leg pain, malaise/fatigue, near-syncope, palpitations, shortness of breath, syncope and weakness. Pertinent negatives include no abdominal pain or hemoptysis.  Her past medical history is significant for aneurysm, hypertension and sickle cell disease.  Pertinent negatives for past medical history include no anxiety/panic attacks, no hyperlipidemia, no MI and no PE.  Her family medical history is significant for diabetes, heart disease, hypertension, PE and stroke.    [name] presents for seizure  Past Medical History:  Diagnosis Date   Brain aneurysm    Common migraine with intractable migraine 03/03/2017   Headache    Hemiplegic migraine 10/07/2015   Late effects of cerebral ischemic stroke 02/09/2018   Left hemiparesis   Primary hypertension 12/06/2020   Seizures (HCC)     Past Surgical History:  Procedure Laterality Date   ABDOMINAL HYSTERECTOMY      Family History  Problem Relation Age of Onset   Stroke Mother    Hypertension Father    Diabetes Father    Migraines Sister    Migraines Brother    Migraines Sister    Migraines Sister    Migraines Sister    Migraines Sister    Migraines Sister    Hypertension Paternal Aunt    Diabetes Paternal Aunt    Stroke Paternal Aunt     Hypertension Paternal Uncle    Diabetes Paternal Uncle    Stroke Paternal Uncle    Hypertension Maternal Grandmother    Aneurysm Paternal Grandmother    Diabetes Paternal Grandmother    Hypertension Paternal Grandfather    Cancer Paternal Grandfather    Seizures Cousin     Social History   Socioeconomic History   Marital status: Married    Spouse name: Not on file   Number of children: 3   Years of education: 11   Highest education level: Not on file  Occupational History   Occupation: Designer, television/film set  Tobacco Use   Smoking status: Former    Packs/day: 0.50    Types: Cigarettes    Quit date: 05/2022    Years since quitting: 0.1   Smokeless tobacco: Never  Substance and Sexual Activity   Alcohol use: Yes    Alcohol/week: 0.0 standard drinks of alcohol    Comment: occasionally   Drug use: No   Sexual activity: Not on file  Other Topics Concern   Not on file  Social History Narrative   Patient drink about 2 cups of caffeine daily.   Patient is right handed.    Social Determinants of Health   Financial Resource Strain: Not on file  Food Insecurity: Not on file  Transportation Needs: Not on file  Physical Activity: Not on file  Stress: Not on file  Social Connections: Not on  file  Intimate Partner Violence: Not on file     Current Outpatient Medications:    albuterol (PROVENTIL HFA;VENTOLIN HFA) 108 (90 Base) MCG/ACT inhaler, Inhale 2 puffs into the lungs every 6 (six) hours as needed for wheezing or shortness of breath., Disp: 1 Inhaler, Rfl: 0   Aspirin-Caffeine (BC FAST PAIN RELIEF ARTHRITIS PO), Take 1 packet by mouth as needed (pain). (Patient not taking: Reported on 07/08/2022), Disp: , Rfl:    Fremanezumab-vfrm (AJOVY) 225 MG/1.5ML SOSY, Inject 225 mg into the muscle every 30 (thirty) days. (Patient not taking: Reported on 06/25/2022), Disp: 1.5 mL, Rfl: 5   ibuprofen (ADVIL) 200 MG tablet, Take 200 mg by mouth as needed (pain)., Disp: , Rfl:     levETIRAcetam (KEPPRA) 750 MG tablet, Take 2 tablets (1,500 mg total) by mouth 2 (two) times daily., Disp: 120 tablet, Rfl: 3   zonisamide (ZONEGRAN) 100 MG capsule, One capsule in the morning and 2 at night, Disp: 270 capsule, Rfl: 3   Allergies  Allergen Reactions   Amoxicillin Anaphylaxis   Fish Allergy Anaphylaxis   Morphine And Related Anaphylaxis   Penicillins Anaphylaxis    Has patient had a PCN reaction causing immediate rash, facial/tongue/throat swelling, SOB or lightheadedness with hypotension: Yes Has patient had a PCN reaction causing severe rash involving mucus membranes or skin necrosis: Yes Has patient had a PCN reaction that required hospitalization Yes Has patient had a PCN reaction occurring within the last 10 years: Yes If all of the above answers are "NO", then may proceed with Cephalosporin use.    Rocephin [Ceftriaxone Sodium In Dextrose] Anaphylaxis   Shellfish Allergy Anaphylaxis and Itching   Tylenol [Acetaminophen] Anaphylaxis   Topamax [Topiramate] Nausea And Vomiting   Tramadol Other (See Comments)    Unknown per SO    ROS Review of Systems  Constitutional:  Positive for diaphoresis and malaise/fatigue.  Respiratory:  Positive for shortness of breath. Negative for hemoptysis.   Cardiovascular:  Positive for chest pain, palpitations, syncope and near-syncope.  Gastrointestinal:  Negative for abdominal pain.  Musculoskeletal:  Positive for back pain.  Neurological:  Positive for dizziness, weakness and headaches.      Objective:    Physical Exam HENT:     Head: Normocephalic.  Cardiovascular:     Rate and Rhythm: Normal rate.  Pulmonary:     Effort: Pulmonary effort is normal.     Breath sounds: Normal breath sounds.  Musculoskeletal:     Cervical back: Normal range of motion.  Skin:    General: Skin is warm and dry.  Neurological:     Mental Status: She is alert and oriented to person, place, and time.     Motor: Weakness present.   Psychiatric:        Mood and Affect: Mood normal.        Behavior: Behavior is agitated.     BP 129/88   Pulse 91   Ht 5\' 1"  (1.549 m)   Wt 213 lb 4.8 oz (96.8 kg)   BMI 40.30 kg/m  Wt Readings from Last 3 Encounters:  07/13/22 213 lb 4.8 oz (96.8 kg)  06/24/22 223 lb 1.7 oz (101.2 kg)  06/24/22 220 lb 7.4 oz (100 kg)     Health Maintenance Due  Topic Date Due   Hepatitis C Screening  Never done   TETANUS/TDAP  Never done   PAP SMEAR-Modifier  Never done   COLONOSCOPY (Pts 45-43yrs Insurance coverage will need to be confirmed)  Never  done   MAMMOGRAM  Never done    There are no preventive care reminders to display for this patient.  No results found for: "TSH" Lab Results  Component Value Date   WBC 14.3 (H) 06/29/2022   HGB 12.0 06/29/2022   HCT 37.7 06/29/2022   MCV 81.6 06/29/2022   PLT 338 06/29/2022   Lab Results  Component Value Date   NA 143 06/29/2022   K 4.1 06/29/2022   CO2 21 (L) 06/29/2022   GLUCOSE 113 (H) 06/29/2022   BUN 12 06/29/2022   CREATININE 0.84 06/29/2022   BILITOT 0.7 06/24/2022   ALKPHOS 88 06/24/2022   AST 31 06/24/2022   ALT 29 06/24/2022   PROT 7.1 06/24/2022   ALBUMIN 3.8 06/24/2022   CALCIUM 9.1 06/29/2022   ANIONGAP 11 06/29/2022   No results found for: "CHOL" No results found for: "HDL" No results found for: "LDLCALC" Lab Results  Component Value Date   TRIG 171 (H) 06/25/2022   No results found for: "CHOLHDL" Lab Results  Component Value Date   HGBA1C 6.5 (H) 06/24/2022      Assessment & Plan:   Problem List Items Addressed This Visit       Cardiovascular and Mediastinum   Hemiplegic migraine    better         Hypertensive heart disease without heart failure    bp is stable        Nervous and Auditory   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   Follow-up: No follow-ups on file.    Corky Downs, MD

## 2022-07-13 NOTE — Assessment & Plan Note (Addendum)
better 

## 2022-07-13 NOTE — Therapy (Signed)
OUTPATIENT OCCUPATIONAL THERAPY NEURO TREATMENT  Patient Name: [name] MRN: [id number] DOB:[birth year], 51 y.o., female Today's Date: 07/13/2022  PCP: Dr. Corky Downs REFERRING PROVIDER: Dr. Corky Downs   OT End of Session - 07/13/22 1647     Visit Number 2    Number of Visits 24    Date for OT Re-Evaluation 09/30/22    Authorization Time Period Reporting period beginning 07/08/22    OT Start Time 1345    OT Stop Time 1430    OT Time Calculation (min) 45 min    Activity Tolerance Patient limited by pain    Behavior During Therapy [facility] for tasks assessed/performed   intermittently tearful d/t pain            Past Medical History:  Diagnosis Date   Brain aneurysm    Common migraine with intractable migraine 03/03/2017   Headache    Hemiplegic migraine 10/07/2015   Late effects of cerebral ischemic stroke 02/09/2018   Left hemiparesis   Primary hypertension 12/06/2020   Seizures (HCC)    Past Surgical History:  Procedure Laterality Date   ABDOMINAL HYSTERECTOMY     Patient Active Problem List   Diagnosis Date Noted   Acute respiratory failure (HCC)    Leukocytosis    Status epilepticus (HCC) 06/24/2022   History of hemorrhagic stroke with residual hemiparesis (HCC) 01/27/2021   Prediabetes 12/06/2020   Obesity (BMI 30.0-34.9) 12/06/2020   Chronic pain 08/09/2020   Headache    Hx of cerebral infarction 06/24/2020   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 Pt accompanied by: self and family member (daughter and granddaughter)  PERTINENT HISTORY: Per chart, patient is a 51 y.o.  female with history of right parietal-occipital ICH 2009-seizure disorder, HTN-presented with status epilepticus with Todd's paresis.  Patient was intubated on presentation for airway protection-admitted to the ICU-neurology consulted-antiepileptics adjusted-upon stability-transfer to [facility] service.  PRECAUTIONS: Other: hx of seizures  WEIGHT BEARING RESTRICTIONS No, but pt self limits WB to L hand d/t severe pain in L wrist with any mobility or pressure  PAIN:  Are you having pain? 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  OBJECTIVE:   FUNCTIONAL OUTCOME MEASURES: FOTO: 52  UPPER EXTREMITY ROM     Active ROM Right eval Left eval  Shoulder flexion WNL 90 (90)  Shoulder abduction WNL NT d/t severe pain after lifting arm into flexion  Shoulder adduction    Shoulder extension    Shoulder internal rotation    Shoulder external rotation  NT (limited  by pain)  Elbow flexion  WNL  Elbow extension  WNL  Wrist flexion  WNL (painful)  Wrist extension  WNL (painful)  (Blank rows = not tested)   UPPER EXTREMITY MMT:    RUE 5/5; deferred LUE MMT d/t significant pain just with attempts at AROM   HAND FUNCTION: Grip strength: Right: 71 lbs; Left: 16 lbs, Lateral pinch: Right: 19 lbs, Left: 8 lbs, and 3 point pinch: Right: 15 lbs,  Left: 7 lbs  COORDINATION: Finger Nose Finger test: mildy impaired LUE 9 Hole Peg test: Right: 24 sec; Left: 40 sec  SENSATION: Light touch: Impaired tingling/numbness sensation throughout LUE   TODAY'S TREATMENT:  Moist heat applied to L shoulder and L hand x 5 min for pain reduction/muscle relaxation in prep for therapeutic exercises and manual therapy.   Manual Therapy: Edema massage performed to L wrist, dorsum of hand, and all digits.  Issued size L isotoner glove open fingers for L hand and encouraged use during the day as needed for pain and swelling, remove at night.  Pt verbalized understanding.  Therapeutic Exercise: Issued light blue theraputty and instructed pt in gross grasping, lateral, 2 point, and 3 point pinching, digit abd, and digging coins out of putty with L hand.  Handout issued and pt performed with fair tolerance.  Encouraged completion a couple times per day for 5-10 min at a time.  Performed gentle PROM for the R wrist and digits flex/ext.  In supine, performed gentle P/AAROM to tolerance for L shoulder depression, elbow flex/ext, and shoulder abd/add, cues for coordinating breaths with movement for increased relaxation and decreased guarding.  Pt ultimately had to sit up d/t pain and transitioned to seated table slides for shoulder flex/abd and elbow flex/ext x2 sets 10 each.  Instructed in cervical ROM all planes, shoulder retraction, elevation/depression, and pendulum swings in standing.  Pt required mod vc for pendulum swing technique.    PATIENT EDUCATION: Education details: coordinating breaths with ROM exercises to decrease guarding, HEP progression, swinging L arm with ambulation Person educated: Patient and Child(ren) Education method: Explanation and Verbal cues, demo Education comprehension: verbalized understanding and needs further education   HOME EXERCISE PROGRAM: Instructed briefly in table slides for L shoulder mobility; pt able to return demo.   Encouraged pt complete within a tolerable ROM.  Pt verbalized understanding.    GOALS: Goals reviewed with patient? Yes  SHORT TERM GOALS: Target date: 08/19/22  Pt will be indep to perform HEP for increasing LUE strength and coordination.  Baseline: Eval: initiated table slides; will progress with additional visits Goal status: INITIAL  2.  Pt will tolerate manual therapy, therapeutic exercises, and modalities as needed for decreasing pain throughout the LUE to <5/10 with activity Baseline: Eval 8-10/10 pain in LUE with activity Goal status: INITIAL  LONG TERM GOALS: Target date: 09/30/22  Pt will increase FOTO score to 60 or greater to indicate increase in perceived performance with daily tasks.  Baseline: Eval: 52 Goal status: INITIAL  2.  Pt will increase L grip strength by 20 or more lbs to enable pt to hold and carry ADL supplies without dropping. Baseline: Eval: L grip 16 (R 71); pt uses R hand to hold and carry ADL supplies Goal status: INITIAL  3.  Pt will increase L shoulder flexibility to enable reaching into overhead cupboards.  Baseline: Eval: L active shoulder flex 90 degrees (passive also 90) Goal status: INITIAL  4.  Pt will increase L hand dexterity to be  able to hook her bra. Baseline: Eval: spouse hooks bra Goal status: INITIAL  ASSESSMENT:  CLINICAL IMPRESSION: Pt continues to be intermittently tearful throughout session d/t extent of her pain.  Moist heat effective for L hand pre tx and during rest periods with exercise.  Pt tolerated hotpack to L shoulder only for a couple of minutes before she couldn't tolerate the weight of the hot pack on her shoulder.   Extensive guarding to P/AAROM to the L elbow and shoulder in supine.  Slightly improved when pt was cued to coordinate breaths with ROM, but still significant guarding and L shoulder active assisted abd was no more than 45 degrees, and elbow ext was only about 75% of full ext.   Better tolerance to LUE  ROM when pt was in control with table slides in sitting and pendulums in standing.  Pt tolerated isotone glove well and was encouraged to use at home during the day if this helped her pain; educated to doff at night time.  Pt continues to hold LUE in a guarded position at her abdomen during amb.  Encouraged pt try to swing L arm at side as able during amb, which she demonstrated walking out of the clinic.  Pt reported that she's trying to engage the L hand as able into daily tasks, but reports that her comfort position is keeping her arm at her abdomen with elbow flexed.  OT continued to encourage use of L hand as able during self care to decrease high fear/avoidance.  Pt will benefit from skilled OT to address above noted deficits for maximizing indep with daily tasks.   PERFORMANCE DEFICITS in functional skills including ADLs, IADLs, coordination, dexterity, sensation, ROM, strength, pain, flexibility, FMC, GMC, mobility, balance, endurance, decreased knowledge of use of DME, skin integrity, and UE functional use, cognitive skills including memory and attention.  IMPAIRMENTS are limiting patient from ADLs, IADLs, work, and leisure.   COMORBIDITIES may have co-morbidities  that affects occupational performance. Patient will benefit from skilled OT to address above impairments and improve overall function.  MODIFICATION OR ASSISTANCE TO COMPLETE EVALUATION: Min-Moderate modification of tasks or assist with assess necessary to complete an evaluation.  OT OCCUPATIONAL PROFILE AND HISTORY: Problem focused assessment: Including review of records relating to presenting problem.  CLINICAL DECISION MAKING: Moderate - several treatment options, min-mod task modification necessary  REHAB POTENTIAL: Good  EVALUATION COMPLEXITY: Moderate    PLAN: OT FREQUENCY: 2x/week  OT DURATION: 12 weeks  PLANNED INTERVENTIONS: self care/ADL training, therapeutic exercise, therapeutic activity, neuromuscular  re-education, manual therapy, passive range of motion, balance training, functional mobility training, paraffin, moist heat, cryotherapy, contrast bath, patient/family education, cognitive remediation/compensation, and DME and/or AE instructions  RECOMMENDED OTHER SERVICES: Pt has referral for PT  CONSULTED AND AGREED WITH PLAN OF CARE: Patient and family member/caregiver  PLAN FOR NEXT SESSION: see poc  Leta Speller, MS, OTR/L  Darleene Cleaver, OT 07/13/2022, 4:50 PM

## 2022-07-15 ENCOUNTER — Ambulatory Visit: Payer: Commercial Managed Care - PPO

## 2022-07-16 ENCOUNTER — Telehealth: Payer: Self-pay | Admitting: *Deleted

## 2022-07-16 ENCOUNTER — Other Ambulatory Visit: Payer: Self-pay | Admitting: *Deleted

## 2022-07-16 ENCOUNTER — Ambulatory Visit: Payer: Commercial Managed Care - PPO

## 2022-07-16 DIAGNOSIS — M6281 Muscle weakness (generalized): Secondary | ICD-10-CM

## 2022-07-16 DIAGNOSIS — Z1211 Encounter for screening for malignant neoplasm of colon: Secondary | ICD-10-CM

## 2022-07-16 DIAGNOSIS — R278 Other lack of coordination: Secondary | ICD-10-CM

## 2022-07-16 DIAGNOSIS — M79602 Pain in left arm: Secondary | ICD-10-CM

## 2022-07-16 DIAGNOSIS — Z8371 Family history of colonic polyps: Secondary | ICD-10-CM

## 2022-07-16 MED ORDER — NA SULFATE-K SULFATE-MG SULF 17.5-3.13-1.6 GM/177ML PO SOLN: 1.0000 | Freq: Once | ORAL | 0 refills | Status: AC

## 2022-07-16 NOTE — Telephone Encounter (Signed)
Gastroenterology Pre-Procedure Review  Request Date: 09/01/2022 Requesting Physician: Dr. Vicente Males  PATIENT REVIEW QUESTIONS: The patient responded to the following health history questions as indicated:    1. Are you having any GI issues? no 2. Do you have a personal history of Polyps? no 3. Do you have a family history of Colon Cancer or Polyps? No (family history) 4. Diabetes Mellitus? no 5. Joint replacements in the past 12 months?no 6. Major health problems in the past 3 months?yes (seizures) 7. Any artificial heart valves, MVP, or defibrillator?no    MEDICATIONS & ALLERGIES:    Patient reports the following regarding taking any anticoagulation/antiplatelet therapy:   Plavix, Coumadin, Eliquis, Xarelto, Lovenox, Pradaxa, Brilinta, or Effient? no Aspirin? yes (81 mg)  Patient confirms/reports the following medications:  Current Outpatient Medications  Medication Sig Dispense Refill   albuterol (PROVENTIL HFA;VENTOLIN HFA) 108 (90 Base) MCG/ACT inhaler Inhale 2 puffs into the lungs every 6 (six) hours as needed for wheezing or shortness of breath. 1 Inhaler 0   Aspirin-Caffeine (BC FAST PAIN RELIEF ARTHRITIS PO) Take 1 packet by mouth as needed (pain). (Patient not taking: Reported on 07/08/2022)     Fremanezumab-vfrm (AJOVY) 225 MG/1.5ML SOSY Inject 225 mg into the muscle every 30 (thirty) days. (Patient not taking: Reported on 06/25/2022) 1.5 mL 5   ibuprofen (ADVIL) 200 MG tablet Take 200 mg by mouth as needed (pain).     levETIRAcetam (KEPPRA) 750 MG tablet Take 2 tablets (1,500 mg total) by mouth 2 (two) times daily. 120 tablet 3   zonisamide (ZONEGRAN) 100 MG capsule One capsule in the morning and 2 at night 270 capsule 3   No current facility-administered medications for this visit.    Patient confirms/reports the following allergies:  Allergies  Allergen Reactions   Amoxicillin Anaphylaxis   Fish Allergy Anaphylaxis   Morphine And Related Anaphylaxis   Penicillins  Anaphylaxis    Has patient had a PCN reaction causing immediate rash, facial/tongue/throat swelling, SOB or lightheadedness with hypotension: Yes Has patient had a PCN reaction causing severe rash involving mucus membranes or skin necrosis: Yes Has patient had a PCN reaction that required hospitalization Yes Has patient had a PCN reaction occurring within the last 10 years: Yes If all of the above answers are "NO", then may proceed with Cephalosporin use.    Rocephin [Ceftriaxone Sodium In Dextrose] Anaphylaxis   Shellfish Allergy Anaphylaxis and Itching   Tylenol [Acetaminophen] Anaphylaxis   Topamax [Topiramate] Nausea And Vomiting   Tramadol Other (See Comments)    Unknown per SO    No orders of the defined types were placed in this encounter.   AUTHORIZATION INFORMATION Primary Insurance: 1D#: Group #:  Secondary Insurance: 1D#: Group #:  SCHEDULE INFORMATION: Date: 09/01/2022 Time: Location: [place]

## 2022-07-19 NOTE — Therapy (Signed)
OUTPATIENT OCCUPATIONAL THERAPY NEURO TREATMENT  Patient Name: [name] MRN: [id number] DOB:12-Sep-1971, 51 y.o., female Today's Date: 07/19/2022  PCP: Dr. Corky Downs REFERRING PROVIDER: Dr. Corky Downs   OT End of Session - 07/19/22 1815     Visit Number 3    Number of Visits 24    Date for OT Re-Evaluation 09/30/22    Authorization Time Period Reporting period beginning 07/08/22    OT Start Time 1430    OT Stop Time 1515    OT Time Calculation (min) 45 min    Activity Tolerance Patient tolerated treatment well    Behavior During Therapy [facility] for tasks assessed/performed             Past Medical History:  Diagnosis Date   Brain aneurysm    Common migraine with intractable migraine 03/03/2017   Headache    Hemiplegic migraine 10/07/2015   Late effects of cerebral ischemic stroke 02/09/2018   Left hemiparesis   Primary hypertension 12/06/2020   Seizures (HCC)    Past Surgical History:  Procedure Laterality Date   ABDOMINAL HYSTERECTOMY     Patient Active Problem List   Diagnosis Date Noted   Acute respiratory failure (HCC)    Leukocytosis    Status epilepticus (HCC) 06/24/2022   History of hemorrhagic stroke with residual hemiparesis (HCC) 01/27/2021   Prediabetes 12/06/2020   Obesity (BMI 30.0-34.9) 12/06/2020   Chronic pain 08/09/2020   Headache    Hx of cerebral infarction 06/24/2020   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  Pt reports she  continues to work with her arm at home, trying to swing her arm when walking, and using it as she can with daily tasks. Pt accompanied by: sister  PERTINENT HISTORY: Per chart, patient is a 51 y.o.  female with history of right parietal-occipital ICH 2009-seizure disorder, HTN-presented with status epilepticus with Todd's paresis.  Patient was intubated on presentation for airway protection-admitted to the ICU-neurology consulted-antiepileptics adjusted-upon stability-transfer to [facility] service.  PRECAUTIONS: Other: hx of seizures  WEIGHT BEARING RESTRICTIONS No, but pt self limits WB to L hand d/t severe pain in L wrist with any mobility or pressure  PAIN:  Are you having pain? 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  OBJECTIVE:   FUNCTIONAL OUTCOME MEASURES: FOTO: 52  UPPER EXTREMITY ROM     Active ROM Right eval Left eval  Shoulder flexion WNL 90 (90)  Shoulder abduction WNL NT d/t severe pain after lifting arm into flexion  Shoulder adduction    Shoulder extension    Shoulder internal rotation  Shoulder external rotation  NT (limited by pain)  Elbow flexion  WNL  Elbow extension  WNL  Wrist flexion  WNL (painful)  Wrist extension  WNL (painful)  (Blank rows = not tested)   UPPER EXTREMITY MMT:    RUE 5/5; deferred LUE MMT d/t significant pain just with attempts at AROM   HAND FUNCTION: Grip strength: Right: 71 lbs; Left: 16 lbs, Lateral pinch: Right: 19 lbs, Left: 8 lbs, and 3 point pinch:  Right: 15 lbs, Left: 7 lbs  COORDINATION: Finger Nose Finger test: mildy impaired LUE 9 Hole Peg test: Right: 24 sec; Left: 40 sec  SENSATION: Light touch: Impaired tingling/numbness sensation throughout LUE   TODAY'S TREATMENT:  Therapeutic Exercise: Instructed pt in table slides for L shoulder flex, horiz abd/add, abd in prep for neuro re-ed activities, min vc for technique.  Reviewed current HEP and added to program to include R/L discrimination activities and guided motor imagery to address chronic pain throughout LUE.    Neuro re-ed: Participated in LUE loading activities to address chronic pain throughout the LUE.  Pt participated in carrying 1# weight around therapy gym, statically holding weight in L hand at side.  With no pain reported, pt increased distance from across the room, to 1 lap around the therapy gym; good tolerance.  Pt completed again carrying small purse in L hand (~2#), able to make a loop around the therapy gym with good tolerance.  Pt reported, "I know it's there, but I can tolerate it," in regards to pain.  Urged pt to perform this activity at home, carrying light weight items that do not cause pain, working up to heavier items as tolerated.  Pt verbalized understanding.   Participation in L/R discrimination activity using "Recognize Hand" app on phone.  Pt's accuracy score ranged from 30% to 50% on the L hand and 40-70% on the R hand after 3 trials when viewing 20 images for each trial.  OT provided education on impaired laterality as it relates to chronic pain, and educated on tx activities for improving L/R discrimination, including using pen to circle L/RUEs from pictures in a magazine.  Educated on progression of this activity by turning magazine clockwise in 90 degree increments when looking for R/L hands.  Introduced Wellsite geologist for the LUE, with plan for pt to imagine LUE being lifted above shoulder level, out to the side; cued pt that arm should not move, but that  pt was to imagine what arm would feel like to move in this way.  Encouraged daily GMI activities as part of HEP.  Pt verbalized understanding and stated she would bring her own magazine in next session to show her work.    PATIENT EDUCATION: Education details: LUE loading activities, R/L discrimination activities, GMI activities  Person educated: Patient and Child(ren) Education method: Explanation and Verbal cues, demo Education comprehension: verbalized understanding and needs further education   HOME EXERCISE PROGRAM: Instructed briefly in table slides for L shoulder mobility; pt able to return demo.  Encouraged pt complete within a tolerable ROM.  Pt verbalized understanding.    GOALS: Goals reviewed with patient? Yes  SHORT TERM GOALS: Target date: 08/19/22  Pt will be indep to perform HEP for increasing LUE strength and coordination.  Baseline: Eval: initiated table slides; will progress with additional visits Goal status: INITIAL  2.  Pt will tolerate manual therapy, therapeutic exercises, and modalities as needed for decreasing pain throughout the LUE to <5/10 with activity Baseline: Eval 8-10/10  pain in LUE with activity Goal status: INITIAL  LONG TERM GOALS: Target date: 09/30/22  Pt will increase FOTO score to 60 or greater to indicate increase in perceived performance with daily tasks.  Baseline: Eval: 52 Goal status: INITIAL  2.  Pt will increase L grip strength by 20 or more lbs to enable pt to hold and carry ADL supplies without dropping. Baseline: Eval: L grip 16 (R 71); pt uses R hand to hold and carry ADL supplies Goal status: INITIAL  3.  Pt will increase L shoulder flexibility to enable reaching into overhead cupboards.  Baseline: Eval: L active shoulder flex 90 degrees (passive also 90) Goal status: INITIAL  4.  Pt will increase L hand dexterity to be able to hook her bra. Baseline: Eval: spouse hooks bra Goal status: INITIAL  ASSESSMENT:  CLINICAL  IMPRESSION: Pt is demonstrating less guarding of the LUE with ambulation today, noting small arm swing.  Pt tolerated loading activities with the LUE, carrying up to 2# in the LUE with good tolerance.  Initiated education today on impaired laterality as it relates to chronic pain, and educated on tx activities for improving L/R discrimination.  Pt participated in an app for L/R discrimination; pt shows significant deficit, noting accuracy score range from 30% to 50% on the L hand and 40-70% on the R hand after 3 trials when viewing 20 images for each trial.  Education was initiated regarding pts without chronic pain in a limb would typically score at 80% or higher with their R/L discrimination accuracy, and with activities to promote improved discrimination as a part of a graded motor imagery program, chronic pain may show improvement.  OT provided R/L discrimination activities for HEP, see note for details.  Pt verbalized willingness to work on these activities at home as part of her daily program.  Pt also receptive to motor imagery activities and will plan to add this to her program as well.  Pt tolerated tx well this session and shows better tolerance to self ROM when pt can be in control of her limb.  Pt will continue to benefit from skilled OT to address chronic pain, weakness, and coordination deficits throughout her LUE in order to better engage the LUE with daily tasks.    PERFORMANCE DEFICITS in functional skills including ADLs, IADLs, coordination, dexterity, sensation, ROM, strength, pain, flexibility, FMC, GMC, mobility, balance, endurance, decreased knowledge of use of DME, skin integrity, and UE functional use, cognitive skills including memory and attention.  IMPAIRMENTS are limiting patient from ADLs, IADLs, work, and leisure.   COMORBIDITIES may have co-morbidities  that affects occupational performance. Patient will benefit from skilled OT to address above impairments and improve overall  function.  MODIFICATION OR ASSISTANCE TO COMPLETE EVALUATION: Min-Moderate modification of tasks or assist with assess necessary to complete an evaluation.  OT OCCUPATIONAL PROFILE AND HISTORY: Problem focused assessment: Including review of records relating to presenting problem.  CLINICAL DECISION MAKING: Moderate - several treatment options, min-mod task modification necessary  REHAB POTENTIAL: Good  EVALUATION COMPLEXITY: Moderate    PLAN: OT FREQUENCY: 2x/week  OT DURATION: 12 weeks  PLANNED INTERVENTIONS: self care/ADL training, therapeutic exercise, therapeutic activity, neuromuscular re-education, manual therapy, passive range of motion, balance training, functional mobility training, paraffin, moist heat, cryotherapy, contrast bath, patient/family education, cognitive remediation/compensation, and DME and/or AE instructions  RECOMMENDED OTHER SERVICES: Pt has referral for PT  CONSULTED AND AGREED WITH PLAN OF CARE: Patient and family member/caregiver  PLAN FOR NEXT SESSION:  see poc  Danelle Earthly, MS, OTR/L  Otis Dials, OT 07/19/2022, 6:17 PM

## 2022-07-20 ENCOUNTER — Ambulatory Visit: Payer: Commercial Managed Care - PPO

## 2022-07-20 NOTE — Therapy (Signed)
OUTPATIENT PHYSICAL THERAPY NEURO EVALUATION   Patient Name: [name] MRN: [id number] DOB:1971-08-09, 51 y.o., female Today's Date: 07/22/2022   PCP: Corky Downs, MD REFERRING PROVIDER: Maretta Bees, MD   PT End of Session - 07/22/22 1102     Visit Number 1    Number of Visits 25    Date for PT Re-Evaluation 10/13/22    PT Start Time 1601    PT Stop Time 1646    PT Time Calculation (min) 45 min    Equipment Utilized During Treatment Gait belt    Activity Tolerance Patient limited by pain    Behavior During Therapy [facility] for tasks assessed/performed             Past Medical History:  Diagnosis Date   Brain aneurysm    Common migraine with intractable migraine 03/03/2017   Headache    Hemiplegic migraine 10/07/2015   Late effects of cerebral ischemic stroke 02/09/2018   Left hemiparesis   Primary hypertension 12/06/2020   Seizures (HCC)    Past Surgical History:  Procedure Laterality Date   ABDOMINAL HYSTERECTOMY     Patient Active Problem List   Diagnosis Date Noted   Acute respiratory failure (HCC)    Leukocytosis    Status epilepticus (HCC) 06/24/2022   History of hemorrhagic stroke with residual hemiparesis (HCC) 01/27/2021   Prediabetes 12/06/2020   Obesity (BMI 30.0-34.9) 12/06/2020   Chronic pain 08/09/2020   Headache    Hx of cerebral infarction 06/24/2020   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) 06/12/2020   Hypertensive heart disease without heart failure 06/05/2020   Common migraine with intractable migraine 03/03/2017   Seizures (HCC) 10/07/2015   Hemiplegic migraine 10/07/2015    ONSET DATE: 06/24/2022  REFERRING DIAG:  G40.901 (ICD-10-CM) - Status epilepticus (HCC)  Z86.73 (ICD-10-CM) - Hx of cerebral infarction  G89.4 (ICD-10-CM) - Chronic pain syndrome  J96.01 (ICD-10-CM) - Acute respiratory failure with hypoxia (HCC)    THERAPY DIAG:  Pain in left leg  Muscle weakness  (generalized)  Unsteadiness on feet  Difficulty in walking, not elsewhere classified  Other lack of coordination  Rationale for Evaluation and Treatment Rehabilitation  SUBJECTIVE:                                                                                                                                                                                              SUBJECTIVE STATEMENT: Pt seen in ED 06/24/2022-/06/30/2022 recently for seizure disorder. She is accompanied by her daughter today. Pt without AD and reports she declines using one. Reports 2 falls in last six months:  one where pt fainted and one where pt slipped in her bathroom. Since recent hospitalization pt with significant L side pain (including low back, LE) and L side foot drag. Pt at times feels woozy and uncoordinated as if she is in a fog. She also experiences lightheadedness occ when getting up too fast. Pt limited to short distance walking and short duration standing due to pain and fatigue. She can stand no more than 10 minutes. She has difficulty completing transfers and getting up from low surfaces. She must use her UE to assist with standing up. Pt accompanied by: family member - daughter   PERTINENT HISTORY:  Pt is a pleasant 51 yo female recently seen in ED 06/24/2022-/06/30/2022 for seizure disorder. Pertinent hx taken from chart and confirmed with pt: pt is  s/p "right parietal-occipital ICH 2009-seizure disorder, HTN-presented with status epilepticus with Todd's paresis.  Patient was intubated on presentation for airway protection-admitted to the ICU-neurology consulted-antiepileptics adjusted-upon stability-transfer to [facility] service." Other PMH per chart significant for hemiplegic migraine, hypertensive heart disease without heart failure, nontraumatic cortical hemorrhage of R cerebral hemisphere, hx cerebral infarction, chronic pain, obesity, prediabetes  PAIN:  Are you having pain? Yes: NPRS scale: rates 10  /10 Pain location: Predominantly L side (throughout UE, low back, and down LLE to foot) but can have pain on the R Pain description: can feel sharp, at times, burning Aggravating factors: cold, doing too much Relieving factors: warming herself up if she is cold Worst pain can get up to a 10/10. Pt reports otherwise constant level of pain. Can get to a 0/10 when warm.  PRECAUTIONS: Fall and Other:  seizures  WEIGHT BEARING RESTRICTIONS No  FALLS: Has patient fallen in last 6 months? Yes. Number of falls 2  LIVING ENVIRONMENT: taken from chart and confirmed with pt Lives with: lives with their family, lives with their spouse, and 3 adult children and 1 grandchild Lives in: House/apartment Stairs: No Has following equipment at home: Dan Humphreys - 2 wheeled, [facility], bedside commode, has a grab bar in the shower   PLOF: Independent Former full-time Chief Executive Officer  PATIENT GOALS  wants to "get back to everything," improve walking, improve balance.   OBJECTIVE:   DIAGNOSTIC FINDINGS:  06/24/2022 CT ANGEIO HEAD NECK W WO CM W PERF: "IMPRESSION: 1. Patent vasculature of the head and neck with no hemodynamically significant stenosis, occlusion, or dissection. 2. No infarct core or penumbra identified on CT perfusion. 3. Mucoid impaction in some right upper lobe airways without postobstructive atelectasis."  06/24/2022 CT HEAD CODE STROKE WO CONTRAST: "IMPRESSION: 1. Negative CT head 2. ASPECTS is 10 3. Code stroke imaging results were communicated on 06/24/2022 at 1:15 pm to provider Selina Cooley via amion page"  COGNITION: Overall cognitive status:  reports since recent hospitalization she feels foggy headed and can get distracted and have trouble remember what she was doing    SENSATION: Reports n/t in feet/hands describes as burning, reports is constant In tact to light touch in BLE but LLE is TTP   COORDINATION: UE coordination WNL impaired LLE due to  pain, RLE WNL  EDEMA:  Reports L hand can swell at times when resting    Palpation: Extremely TTP throughout entire LLE. At times pt becomes teary with testing   LOWER EXTREMITY ROM:    Testing limited today secondary to significant LLE pain (generalized), causing guarding/limiting pt's mobility  LOWER EXTREMITY MMT:     RLE grossly 4+/5 exceptions include hip flexors  and HIP IR/ER which was slighlty pain limited (4/5)  LLE- unable to complete formal testing due to significant pain limitation. Most painful with adduction.    BED MOBILITY:  Impaired per pt. Difficulty with rolling, getting up from low surface.  TRANSFERS: Assistive device utilized:  Pt declines using an AD although this has been recommended to pt   Sit to stand: Modified independence and SBA Stand to sit: Modified independence    STAIRS: Pt and daughter report difficulty with stairs  GAIT: Gait pattern:  decreased gait speed, antalgic, decreased heel strike B, however, more notable on R than L   Distance walked: length of clinic, and formal testing over 10 meters Assistive device utilized:  pt declines use of AD Level of assistance: CGA Comments: pt gait pattern indicates fall risk, requires close CGA, pt declines use of AD despite recommendation  FUNCTIONAL TESTs:  5 times sit to stand: 16 seconds use of UE to assist 10 meter walk test: 0.76 m/s no AD    PATIENT SURVEYS:  ABC scale 53.12% FOTO 53 (goal score 61)  TODAY'S TREATMENT:  Eval only   PATIENT EDUCATION: Education details: goals, tests and measures, plan Person educated: Patient and Child(ren) Education method: Explanation, Demonstration, Tactile cues, and Verbal cues Education comprehension: verbalized understanding, returned demonstration, verbal cues required, and needs further education   HOME EXERCISE PROGRAM: To be issued next 1-2 visits (session significantly limited today secondary to LLE pain)     GOALS: Goals  reviewed with patient? Yes  SHORT TERM GOALS: Target date: 09/01/2022  Patient will be independent in home exercise program to improve strength/mobility for better functional independence with ADLs. Baseline: to be initiated Goal status: INITIAL   LONG TERM GOALS: Target date: 10/13/2022  Patient will increase FOTO score to equal to or greater than  61   to demonstrate statistically significant improvement in mobility and quality of life.  Baseline: 53 Goal status: INITIAL  2.   Patient (< 59 years old) will complete five times sit to stand test in < 10 seconds indicating an increased LE strength and improved balance. Baseline: 16 sec with UE assist Goal status: INITIAL  3.   Patient will increase 10 meter walk test to >1.39m/s as to improve gait speed for better community ambulation and to reduce fall risk. Baseline: 0.76 m/s no AD Goal status: INITIAL  4.   Patient will demonstrate an improved Berg Balance Score of > as to demonstrate improved balance with ADLs such as sitting/standing and transfer balance and reduced fall risk.  Baseline: to be performed next 1-2 visits Goal status: INITIAL  5. Patient will increase ABC scale score >80% to demonstrate better functional mobility and better confidence with ADLs.  Baseline: 53.12% Goal status: INITIAL   ASSESSMENT:  CLINICAL IMPRESSION: Patient is a pleasant 51 y.o. female with complex medical history who was seen today for physical therapy evaluation s/p recent hospitalization for seizure disorder. Full testing and evaluation limited secondary to significant LLE pain, where pt very TTP throughout, crying at times during appointment. Testing completed today indicates impairments of pain, coordination, sensation, strength, balance and gait. Pt's 5xSTS score indicates pt is at an increased risk for future falls, and ABC score indicates impaired balance confidence. Further assessment to be completed of pt's low back and LLE due to high  pain levels that are impacting her mobility and safety. The pt will benefit from further skilled PT to address these impairments in order to improve strength, balance, gait,  mobility,  and QOL and to decrease fall risk.    OBJECTIVE IMPAIRMENTS Abnormal gait, decreased activity tolerance, decreased balance, decreased coordination, decreased endurance, decreased knowledge of use of DME, decreased mobility, difficulty walking, decreased ROM, decreased strength, decreased safety awareness, dizziness, increased edema, increased muscle spasms, impaired sensation, impaired UE functional use, improper body mechanics, postural dysfunction, and pain.   ACTIVITY LIMITATIONS carrying, lifting, bending, standing, squatting, stairs, transfers, bed mobility, toileting, dressing, reach over head, hygiene/grooming, and locomotion level  PARTICIPATION LIMITATIONS: meal prep, cleaning, laundry, medication management, driving, shopping, community activity, occupation, and yard work  PERSONAL FACTORS Behavior pattern, Fitness, Past/current experiences, Sex, Time since onset of injury/illness/exacerbation, and 3+ comorbidities: Other PMH per chart significant for hemiplegic migraine, hypertensive heart disease without heart failure, nontraumatic cortical hemorrhage of R cerebral hemisphere, hx cerebral infarction, chronic pain, obesity, prediabetes  are also affecting patient's functional outcome.   REHAB POTENTIAL: Fair    CLINICAL DECISION MAKING: Evolving/moderate complexity  EVALUATION COMPLEXITY: High  PLAN: PT FREQUENCY: 2x/week  PT DURATION: 12 weeks  PLANNED INTERVENTIONS: Therapeutic exercises, Therapeutic activity, Neuromuscular re-education, Balance training, Gait training, Patient/Family education, Self Care, Joint mobilization, Stair training, Vestibular training, Canalith repositioning, Orthotic/Fit training, DME instructions, Dry Needling, Electrical stimulation, Wheelchair mobility training, Spinal  mobilization, Cryotherapy, Moist heat, Splintting, Taping, Traction, Parrafin, Manual therapy, and Re-evaluation  PLAN FOR NEXT SESSION: further assessment, issue HEP, strength, balance   Zollie Pee, PT 07/22/2022, 11:36 AM

## 2022-07-21 ENCOUNTER — Ambulatory Visit: Payer: Commercial Managed Care - PPO

## 2022-07-21 DIAGNOSIS — R278 Other lack of coordination: Secondary | ICD-10-CM

## 2022-07-21 DIAGNOSIS — M6281 Muscle weakness (generalized): Secondary | ICD-10-CM

## 2022-07-21 DIAGNOSIS — M79605 Pain in left leg: Secondary | ICD-10-CM

## 2022-07-21 DIAGNOSIS — R2681 Unsteadiness on feet: Secondary | ICD-10-CM

## 2022-07-21 DIAGNOSIS — M79602 Pain in left arm: Secondary | ICD-10-CM

## 2022-07-21 DIAGNOSIS — R262 Difficulty in walking, not elsewhere classified: Secondary | ICD-10-CM

## 2022-07-22 ENCOUNTER — Ambulatory Visit: Payer: Commercial Managed Care - PPO | Admitting: Physical Therapy

## 2022-07-22 ENCOUNTER — Ambulatory Visit: Payer: Commercial Managed Care - PPO

## 2022-07-22 NOTE — Therapy (Signed)
OUTPATIENT OCCUPATIONAL THERAPY NEURO TREATMENT  Patient Name: [name] MRN: [id number] DOB:[birth year], 51 y.o., female Today's Date: 07/22/2022  PCP: Dr. Cletis Athens REFERRING PROVIDER: Dr. Cletis Athens   OT End of Session - 07/22/22 1323     Visit Number 4    Number of Visits 24    Date for OT Re-Evaluation 09/30/22    Authorization Time Period Reporting period beginning 07/08/22    OT Start Time 1515    OT Stop Time 1600    OT Time Calculation (min) 45 min    Activity Tolerance Patient tolerated treatment well    Behavior During Therapy [facility] for tasks assessed/performed             Past Medical History:  Diagnosis Date   Brain aneurysm    Common migraine with intractable migraine 03/03/2017   Headache    Hemiplegic migraine 10/07/2015   Late effects of cerebral ischemic stroke 02/09/2018   Left hemiparesis   Primary hypertension 12/06/2020   Seizures ([place])    Past Surgical History:  Procedure Laterality Date   ABDOMINAL HYSTERECTOMY     Patient Active Problem List   Diagnosis Date Noted   Acute respiratory failure ([place])    Leukocytosis    Status epilepticus ([place]) 06/24/2022   History of hemorrhagic stroke with residual hemiparesis ([place]) 01/27/2021   Prediabetes 12/06/2020   Obesity (BMI 30.0-34.9) 12/06/2020   Chronic pain 08/09/2020   Headache    Hx of cerebral infarction 06/24/2020   Nontraumatic cortical hemorrhage of right cerebral hemisphere ([place]) 06/12/2020   Hypertensive heart disease without heart failure 06/05/2020   Common migraine with intractable migraine 03/03/2017   Seizures ([place]) 10/07/2015   Hemiplegic migraine 10/07/2015    ONSET DATE: 06/24/22  REFERRING DIAG: Status epilepticus, muscle weakness  THERAPY DIAG:  Muscle weakness (generalized)  Other lack of coordination  Pain in left arm  Rationale for Evaluation and Treatment Rehabilitation  SUBJECTIVE:   SUBJECTIVE STATEMENT: Pt reports that she's been doing some guided  motor imagery for the LUE, doing her table slides, but that she has not yet done her magazine activity to work on L/R discrimination. Pt accompanied by: daughter  PERTINENT HISTORY: Per chart, patient is a 51 y.o.  female with history of right parietal-occipital ICH 2009-seizure disorder, HTN-presented with status epilepticus with Todd's paresis.  Patient was intubated on presentation for airway protection-admitted to the ICU-neurology consulted-antiepileptics adjusted-upon stability-transfer to [facility] service.  PRECAUTIONS: Other: hx of seizures  WEIGHT BEARING RESTRICTIONS No, but pt self limits WB to L hand d/t severe pain in L wrist with any mobility or pressure  PAIN:  Are you having pain? 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  OBJECTIVE:   FUNCTIONAL OUTCOME MEASURES: FOTO: 52  UPPER EXTREMITY ROM     Active ROM Right eval Left eval  Shoulder flexion WNL 90 (90)  Shoulder abduction WNL NT d/t severe pain after lifting arm into flexion  Shoulder adduction    Shoulder extension    Shoulder internal rotation  Shoulder external rotation  NT (limited by pain)  Elbow flexion  WNL  Elbow extension  WNL  Wrist flexion  WNL (painful)  Wrist extension  WNL (painful)  (Blank rows = not tested)   UPPER EXTREMITY MMT:    RUE 5/5; deferred LUE MMT d/t significant pain just with attempts at AROM   HAND FUNCTION: Grip strength: Right: 71 lbs; Left: 16 lbs, Lateral pinch: Right: 19 lbs, Left: 8 lbs, and 3  point pinch: Right: 15 lbs, Left: 7 lbs  COORDINATION: Finger Nose Finger test: mildy impaired LUE 9 Hole Peg test: Right: 24 sec; Left: 40 sec  SENSATION: Light touch: Impaired tingling/numbness sensation throughout LUE   TODAY'S TREATMENT:  Therapeutic Exercise: Performed active scapular elevation and retraction x10 bilaterally.  Performed table slides for L shoulder flex, horiz abd/add, abd in prep for neuro re-ed activities, min vc for technique.  Facilitated grip strengthening with use of hand gripper on light resistance with 1 red band to complete 3 sets 10 reps of grip squeezes.  Cued to rest between sets and perform slow and controlled.  Completed UBE from seated position, lowered to height that would allow rotations with arm below 90 degrees flexion for better tolerance.  Pt completed UBE without resistance, 2 min forward with 1 rest break, and 2 min reverse rotation with 1 rest break, cues for relaxing L shoulder and moving with slow, controlled pace.  Pt tolerated fairly well.  Completed a dowel crawl with dowel placed vertically on table top; pt climbed the weight with bilat hands to promote shoulder flexion stretch; x2 reps.   Neuro re-ed: Participation in L/R discrimination activity using "Recognize Hand" app on phone.  Pt's accuracy score continues to be BNL, ~30% accurate to identify L hand images.  Encouraged pt practice her L/R discrimination with circling L hands in a magazine (her homework from last session which she reported she did not yet accomplish but verbalized importance of completion and pt states she will plan to work on this before next session).  Pt facilitated L hand FMC/GMC skills working to connect small pvc pipes into a tower and then worked to pull them apart.  Pt required cues to use the R hand as a stabilizer and use the L hand to place and remove parts.    PATIENT EDUCATION: Education details: LUE loading activities, R/L discrimination activities, GMI  activities  Person educated: Patient and Child(ren) Education method: Explanation and Verbal cues, demo Education comprehension: verbalized understanding and needs further education   HOME EXERCISE PROGRAM: Instructed briefly in table slides for L shoulder mobility; pt able to return demo.  Encouraged pt complete within a tolerable ROM.  Pt verbalized understanding.    GOALS: Goals reviewed with patient? Yes  SHORT TERM GOALS: Target date: 08/19/22  Pt will be indep to perform HEP for increasing LUE strength and coordination.  Baseline: Eval: initiated table slides; will progress with additional visits Goal status: INITIAL  2.  Pt will tolerate manual therapy, therapeutic exercises, and modalities as needed for decreasing pain throughout the LUE to <5/10 with activity Baseline: Eval 8-10/10 pain in LUE with activity Goal status: INITIAL  LONG TERM GOALS: Target date: 09/30/22  Pt will increase FOTO score to 60 or greater to indicate increase in perceived performance with daily tasks.  Baseline: Eval: 52 Goal status: INITIAL  2.  Pt will increase L grip strength by 20 or more lbs to enable pt to hold and carry ADL supplies without dropping. Baseline:  Eval: L grip 16 (R 71); pt uses R hand to hold and carry ADL supplies Goal status: INITIAL  3.  Pt will increase L shoulder flexibility to enable reaching into overhead cupboards.  Baseline: Eval: L active shoulder flex 90 degrees (passive also 90) Goal status: INITIAL  4.  Pt will increase L hand dexterity to be able to hook her bra. Baseline: Eval: spouse hooks bra Goal status: INITIAL  ASSESSMENT:  CLINICAL IMPRESSION: Pt reports that she's been doing some guided motor imagery for the LUE, doing her table slides, but that she has not yet done her magazine activity to work on L/R discrimination.  Pt's accuracy score with L/R discrimination continues to be BNL, ~30% accurate to identify L hand images.  Pt continues to  verbalize understanding of correlation between decreased L/R discrimination and chronic pain in a limb.  Pt states that she will plan to work on this at home before next session.  Pt continues to tolerate therapeutic exercises where pt can be in charge of moving her limb to her tolerance; OT continues to avoid passive range as this results in more guarding of the LUE.  Pt continues to show less guarding with the LUE, noting pt slightly swinging her arm with mobility into the clinic.  LUE remains weak and pt continues to have intermittent shooting, severe pains throughout the LUE, but overall progressing well with her attempts at engaging the LUE into daily tasks.  Pt stated that she has been using her L hand to help with doing laundry this week.  Pt will continue to benefit from skilled OT to address chronic pain, weakness, and coordination deficits throughout her LUE in order to better engage the LUE with daily tasks.    PERFORMANCE DEFICITS in functional skills including ADLs, IADLs, coordination, dexterity, sensation, ROM, strength, pain, flexibility, FMC, GMC, mobility, balance, endurance, decreased knowledge of use of DME, skin integrity, and UE functional use, cognitive skills including memory and attention.  IMPAIRMENTS are limiting patient from ADLs, IADLs, work, and leisure.   COMORBIDITIES may have co-morbidities  that affects occupational performance. Patient will benefit from skilled OT to address above impairments and improve overall function.  MODIFICATION OR ASSISTANCE TO COMPLETE EVALUATION: Min-Moderate modification of tasks or assist with assess necessary to complete an evaluation.  OT OCCUPATIONAL PROFILE AND HISTORY: Problem focused assessment: Including review of records relating to presenting problem.  CLINICAL DECISION MAKING: Moderate - several treatment options, min-mod task modification necessary  REHAB POTENTIAL: Good  EVALUATION COMPLEXITY: Moderate    PLAN: OT  FREQUENCY: 2x/week  OT DURATION: 12 weeks  PLANNED INTERVENTIONS: self care/ADL training, therapeutic exercise, therapeutic activity, neuromuscular re-education, manual therapy, passive range of motion, balance training, functional mobility training, paraffin, moist heat, cryotherapy, contrast bath, patient/family education, cognitive remediation/compensation, and DME and/or AE instructions  RECOMMENDED OTHER SERVICES: Pt has referral for PT  CONSULTED AND AGREED WITH PLAN OF CARE: Patient and family member/caregiver  PLAN FOR NEXT SESSION: see poc  Leta Speller, MS, OTR/L  Darleene Cleaver, OT 07/22/2022, 1:25 PM

## 2022-07-23 ENCOUNTER — Ambulatory Visit: Payer: Commercial Managed Care - PPO

## 2022-07-27 ENCOUNTER — Ambulatory Visit: Payer: Commercial Managed Care - PPO

## 2022-07-27 ENCOUNTER — Ambulatory Visit: Payer: Commercial Managed Care - PPO | Attending: Internal Medicine

## 2022-07-27 ENCOUNTER — Ambulatory Visit (INDEPENDENT_AMBULATORY_CARE_PROVIDER_SITE_OTHER): Payer: Commercial Managed Care - PPO | Admitting: Internal Medicine

## 2022-07-27 ENCOUNTER — Encounter: Payer: Self-pay | Admitting: Internal Medicine

## 2022-07-27 VITALS — BP 138/85 | HR 80 | Ht 61.0 in | Wt 211.5 lb

## 2022-07-27 DIAGNOSIS — Z8673 Personal history of transient ischemic attack (TIA), and cerebral infarction without residual deficits: Secondary | ICD-10-CM

## 2022-07-27 DIAGNOSIS — G43019 Migraine without aura, intractable, without status migrainosus: Secondary | ICD-10-CM

## 2022-07-27 DIAGNOSIS — R7303 Prediabetes: Secondary | ICD-10-CM

## 2022-07-27 DIAGNOSIS — E66811 Obesity, class 1: Secondary | ICD-10-CM

## 2022-07-27 DIAGNOSIS — M6281 Muscle weakness (generalized): Secondary | ICD-10-CM

## 2022-07-27 DIAGNOSIS — I119 Hypertensive heart disease without heart failure: Secondary | ICD-10-CM | POA: Diagnosis not present

## 2022-07-27 DIAGNOSIS — R262 Difficulty in walking, not elsewhere classified: Secondary | ICD-10-CM | POA: Insufficient documentation

## 2022-07-27 DIAGNOSIS — R278 Other lack of coordination: Secondary | ICD-10-CM | POA: Insufficient documentation

## 2022-07-27 DIAGNOSIS — R569 Unspecified convulsions: Secondary | ICD-10-CM

## 2022-07-27 DIAGNOSIS — M79605 Pain in left leg: Secondary | ICD-10-CM | POA: Insufficient documentation

## 2022-07-27 DIAGNOSIS — M79602 Pain in left arm: Secondary | ICD-10-CM | POA: Insufficient documentation

## 2022-07-27 DIAGNOSIS — E669 Obesity, unspecified: Secondary | ICD-10-CM | POA: Diagnosis not present

## 2022-07-27 DIAGNOSIS — R2681 Unsteadiness on feet: Secondary | ICD-10-CM | POA: Diagnosis present

## 2022-07-27 MED ORDER — LEVETIRACETAM 750 MG PO TABS: 1500.0000 mg | ORAL_TABLET | Freq: Two times a day (BID) | ORAL | 3 refills | Status: DC

## 2022-07-27 MED ORDER — ALBUTEROL SULFATE HFA 108 (90 BASE) MCG/ACT IN AERS: 2.0000 | INHALATION_SPRAY | Freq: Four times a day (QID) | RESPIRATORY_TRACT | 3 refills | Status: AC | PRN

## 2022-07-27 MED ORDER — IBUPROFEN 200 MG PO TABS: 200.0000 mg | ORAL_TABLET | ORAL | 3 refills | Status: AC | PRN

## 2022-07-27 NOTE — Assessment & Plan Note (Signed)
Stable at the present 

## 2022-07-27 NOTE — Assessment & Plan Note (Signed)

## 2022-07-27 NOTE — Assessment & Plan Note (Signed)
Patient was advised.  

## 2022-07-27 NOTE — Assessment & Plan Note (Signed)
Stable

## 2022-07-27 NOTE — Assessment & Plan Note (Signed)

## 2022-07-27 NOTE — Addendum Note (Signed)
Addended by: Lacretia Nicks L on: 07/27/2022 11:51 AM   Modules accepted: Orders

## 2022-07-27 NOTE — Therapy (Signed)
OUTPATIENT PHYSICAL THERAPY NEURO TREATMENT   Patient Name: [name] MRN: [id number] DOB:[birth year], 51 y.o., female Today's Date: 07/28/2022   PCP: Cletis Athens, MD REFERRING PROVIDER: Jonetta Osgood, MD   PT End of Session - 07/27/22 1517     Visit Number 2    Number of Visits 25    Date for PT Re-Evaluation 10/13/22    PT Start Time 9371    PT Stop Time 1541    PT Time Calculation (min) 24 min    Equipment Utilized During Treatment Gait belt    Activity Tolerance Patient limited by pain    Behavior During Therapy [facility] for tasks assessed/performed              Past Medical History:  Diagnosis Date   Brain aneurysm    Common migraine with intractable migraine 03/03/2017   Headache    Hemiplegic migraine 10/07/2015   Late effects of cerebral ischemic stroke 02/09/2018   Left hemiparesis   Primary hypertension 12/06/2020   Seizures ([place])    Past Surgical History:  Procedure Laterality Date   ABDOMINAL HYSTERECTOMY     Patient Active Problem List   Diagnosis Date Noted   Acute respiratory failure ([place])    Leukocytosis    Status epilepticus ([place]) 06/24/2022   History of hemorrhagic stroke with residual hemiparesis ([place]) 01/27/2021   Prediabetes 12/06/2020   Obesity (BMI 30.0-34.9) 12/06/2020   Chronic pain 08/09/2020   Headache    Hx of cerebral infarction 06/24/2020   Nontraumatic cortical hemorrhage of right cerebral hemisphere ([place]) 06/12/2020   Hypertensive heart disease without heart failure 06/05/2020   Common migraine with intractable migraine 03/03/2017   Seizures ([place]) 10/07/2015   Hemiplegic migraine 10/07/2015    ONSET DATE: 06/24/2022  REFERRING DIAG:  G40.901 (ICD-10-CM) - Status epilepticus ([place])  Z86.73 (ICD-10-CM) - Hx of cerebral infarction  G89.4 (ICD-10-CM) - Chronic pain syndrome  J96.01 (ICD-10-CM) - Acute respiratory failure with hypoxia (HCC)    THERAPY DIAG:  Muscle weakness (generalized)  Pain in left  leg  Unsteadiness on feet  Difficulty in walking, not elsewhere classified  Rationale for Evaluation and Treatment Rehabilitation  SUBJECTIVE:                                                                                                                                                                                              SUBJECTIVE STATEMENT: Patient reports her migraine and head are bothering her. Her left leg is painful with movement.  Pt accompanied by: family member - daughter   PERTINENT HISTORY:  Pt is a pleasant 51 yo female  recently seen in ED 06/24/2022-/06/30/2022 for seizure disorder. Pertinent hx taken from chart and confirmed with pt: pt is  s/p "right parietal-occipital ICH 2009-seizure disorder, HTN-presented with status epilepticus with Todd's paresis.  Patient was intubated on presentation for airway protection-admitted to the ICU-neurology consulted-antiepileptics adjusted-upon stability-transfer to [facility] service." Other PMH per chart significant for hemiplegic migraine, hypertensive heart disease without heart failure, nontraumatic cortical hemorrhage of R cerebral hemisphere, hx cerebral infarction, chronic pain, obesity, prediabetes  PAIN:  Are you having pain? Yes: NPRS scale: rates 6 /10 Pain location: Predominantly L side (throughout UE, low back, and down LLE to foot) but can have pain on the R Pain description: can feel sharp, at times, burning Aggravating factors: cold, doing too much Relieving factors: warming herself up if she is cold Worst pain can get up to a 10/10. Pt reports otherwise constant level of pain. Can get to a 0/10 when warm.  PRECAUTIONS: Fall and Other:  seizures  WEIGHT BEARING RESTRICTIONS No  FALLS: Has patient fallen in last 6 months? Yes. Number of falls 2  LIVING ENVIRONMENT: taken from chart and confirmed with pt Lives with: lives with their family, lives with their spouse, and 3 adult children and 1 grandchild Lives in:  House/apartment Stairs: No Has following equipment at home: Gilford Rile - 2 wheeled, [facility], bedside commode, has a grab bar in the shower   PLOF: Independent Former full-time Tree surgeon  PATIENT GOALS  wants to "get back to everything," improve walking, improve balance.   OBJECTIVE:   DIAGNOSTIC FINDINGS:  06/24/2022 CT ANGEIO HEAD NECK W WO CM W PERF: "IMPRESSION: 1. Patent vasculature of the head and neck with no hemodynamically significant stenosis, occlusion, or dissection. 2. No infarct core or penumbra identified on CT perfusion. 3. Mucoid impaction in some right upper lobe airways without postobstructive atelectasis."  06/24/2022 CT HEAD CODE STROKE WO CONTRAST: "IMPRESSION: 1. Negative CT head 2. ASPECTS is 10 3. Code stroke imaging results were communicated on 06/24/2022 at 1:15 pm to provider Quinn Axe via [place] page"  COGNITION: Overall cognitive status:  reports since recent hospitalization she feels foggy headed and can get distracted and have trouble remember what she was doing    SENSATION: Reports n/t in feet/hands describes as burning, reports is constant In tact to light touch in BLE but LLE is TTP   COORDINATION: UE coordination WNL impaired LLE due to pain, RLE WNL  EDEMA:  Reports L hand can swell at times when resting    Palpation: Extremely TTP throughout entire LLE. At times pt becomes teary with testing   LOWER EXTREMITY ROM:    Testing limited today secondary to significant LLE pain (generalized), causing guarding/limiting pt's mobility  LOWER EXTREMITY MMT:     RLE grossly 4+/5 exceptions include hip flexors and HIP IR/ER which was slighlty pain limited (4/5)  LLE- unable to complete formal testing due to significant pain limitation. Most painful with adduction.    BED MOBILITY:  Impaired per pt. Difficulty with rolling, getting up from low surface.  TRANSFERS: Assistive device utilized:  Pt declines using  an AD although this has been recommended to pt   Sit to stand: Modified independence and SBA Stand to sit: Modified independence    STAIRS: Pt and daughter report difficulty with stairs  GAIT: Gait pattern:  decreased gait speed, antalgic, decreased heel strike B, however, more notable on R than L   Distance walked: length of clinic, and formal testing over 10  meters Assistive device utilized:  pt declines use of AD Level of assistance: CGA Comments: pt gait pattern indicates fall risk, requires close CGA, pt declines use of AD despite recommendation  FUNCTIONAL TESTs:  5 times sit to stand: 16 seconds use of UE to assist 10 meter walk test: 0.76 m/s no AD    PATIENT SURVEYS:  ABC scale 53.12% FOTO 53 (goal score 61)  TODAY'S TREATMENT:  TherEx: Sit to stand from elevated chair x10; one near LOB    Ambulate 150 ft; two episodes of dizziness: BP afterwards 126/69 seated; standing 131/73  Seated prostretch: painful x2 minutes TrA contraction into swiss ball 10x 5 second holds   Neuro re-ed:  Standing with CGA next to support surface:  Airex pad: static stand 30 seconds x 2 trials, noticeable trembling of ankles/LE's with fatigue and challenge to maintain stability Airex pad: horizontal head turns 30 seconds scanning room 10x ; cueing for arc of motion  Airex pad: vertical head turns 30 seconds, cueing for arc of motion, noticeable sway with upward gaze increasing demand on ankle righting reaction musculature Airex pad: one foot on 6" step one foot on airex pad, hold position for 30 seconds, switch legs, 2x each LE;     PATIENT EDUCATION: Education details: goals, tests and measures, plan Person educated: Patient and Child(ren) Education method: Explanation, Demonstration, Tactile cues, and Verbal cues Education comprehension: verbalized understanding, returned demonstration, verbal cues required, and needs further education   HOME EXERCISE PROGRAM: To be issued  next 1-2 visits (session significantly limited today secondary to LLE pain)     GOALS: Goals reviewed with patient? Yes  SHORT TERM GOALS: Target date: 09/01/2022  Patient will be independent in home exercise program to improve strength/mobility for better functional independence with ADLs. Baseline: to be initiated Goal status: INITIAL   LONG TERM GOALS: Target date: 10/13/2022  Patient will increase FOTO score to equal to or greater than  61   to demonstrate statistically significant improvement in mobility and quality of life.  Baseline: 53 Goal status: INITIAL  2.   Patient (< 53 years old) will complete five times sit to stand test in < 10 seconds indicating an increased LE strength and improved balance. Baseline: 16 sec with UE assist Goal status: INITIAL  3.   Patient will increase 10 meter walk test to >1.66m/s as to improve gait speed for better community ambulation and to reduce fall risk. Baseline: 0.76 m/s no AD Goal status: INITIAL  4.   Patient will demonstrate an improved Berg Balance Score of > as to demonstrate improved balance with ADLs such as sitting/standing and transfer balance and reduced fall risk.  Baseline: to be performed next 1-2 visits Goal status: INITIAL  5. Patient will increase ABC scale score >80% to demonstrate better functional mobility and better confidence with ADLs.  Baseline: 53.12% Goal status: INITIAL   ASSESSMENT:  CLINICAL IMPRESSION: Patient session limited by severe migraine that worsened throughout session. She has significant LLE pain that worsens in standing but does not improve with seated rest breaks. Patient is able to stabilize on unstable surfaces for short durations but continues to have pain limiting full stabilization.  The pt will benefit from further skilled PT to address these impairments in order to improve strength, balance, gait, mobility,  and QOL and to decrease fall risk.    OBJECTIVE IMPAIRMENTS Abnormal  gait, decreased activity tolerance, decreased balance, decreased coordination, decreased endurance, decreased knowledge of use of DME, decreased mobility,  difficulty walking, decreased ROM, decreased strength, decreased safety awareness, dizziness, increased edema, increased muscle spasms, impaired sensation, impaired UE functional use, improper body mechanics, postural dysfunction, and pain.   ACTIVITY LIMITATIONS carrying, lifting, bending, standing, squatting, stairs, transfers, bed mobility, toileting, dressing, reach over head, hygiene/grooming, and locomotion level  PARTICIPATION LIMITATIONS: meal prep, cleaning, laundry, medication management, driving, shopping, community activity, occupation, and yard work  PERSONAL FACTORS Behavior pattern, Fitness, Past/current experiences, Sex, Time since onset of injury/illness/exacerbation, and 3+ comorbidities: Other PMH per chart significant for hemiplegic migraine, hypertensive heart disease without heart failure, nontraumatic cortical hemorrhage of R cerebral hemisphere, hx cerebral infarction, chronic pain, obesity, prediabetes  are also affecting patient's functional outcome.   REHAB POTENTIAL: Fair    CLINICAL DECISION MAKING: Evolving/moderate complexity  EVALUATION COMPLEXITY: High  PLAN: PT FREQUENCY: 2x/week  PT DURATION: 12 weeks  PLANNED INTERVENTIONS: Therapeutic exercises, Therapeutic activity, Neuromuscular re-education, Balance training, Gait training, Patient/Family education, Self Care, Joint mobilization, Stair training, Vestibular training, Canalith repositioning, Orthotic/Fit training, DME instructions, Dry Needling, Electrical stimulation, Wheelchair mobility training, Spinal mobilization, Cryotherapy, Moist heat, Splintting, Taping, Traction, Parrafin, Manual therapy, and Re-evaluation  PLAN FOR NEXT SESSION: further assessment, issue HEP, strength, balance   Janna Arch, PT 07/28/2022, 6:59 AM

## 2022-07-27 NOTE — Therapy (Signed)
OUTPATIENT OCCUPATIONAL THERAPY NEURO TREATMENT  Patient Name: [name] MRN: [id number] DOB:05/21/1971, 51 y.o., female Today's Date: 07/27/2022  PCP: Dr. Cletis Athens REFERRING PROVIDER: Dr. Cletis Athens   OT End of Session - 07/27/22 1444     Visit Number 5    Number of Visits 24    Date for OT Re-Evaluation 09/30/22    Authorization Time Period Reporting period beginning 07/08/22    OT Start Time 1430    OT Stop Time 1515    OT Time Calculation (min) 45 min    Activity Tolerance Patient tolerated treatment well    Behavior During Therapy [facility] for tasks assessed/performed             Past Medical History:  Diagnosis Date   Brain aneurysm    Common migraine with intractable migraine 03/03/2017   Headache    Hemiplegic migraine 10/07/2015   Late effects of cerebral ischemic stroke 02/09/2018   Left hemiparesis   Primary hypertension 12/06/2020   Seizures ([place])    Past Surgical History:  Procedure Laterality Date   ABDOMINAL HYSTERECTOMY     Patient Active Problem List   Diagnosis Date Noted   Acute respiratory failure ([place])    Leukocytosis    Status epilepticus ([place]) 06/24/2022   History of hemorrhagic stroke with residual hemiparesis ([place]) 01/27/2021   Prediabetes 12/06/2020   Obesity (BMI 30.0-34.9) 12/06/2020   Chronic pain 08/09/2020   Headache    Hx of cerebral infarction 06/24/2020   Nontraumatic cortical hemorrhage of right cerebral hemisphere ([place]) 06/12/2020   Hypertensive heart disease without heart failure 06/05/2020   Common migraine with intractable migraine 03/03/2017   Seizures ([place]) 10/07/2015   Hemiplegic migraine 10/07/2015    ONSET DATE: 06/24/22  REFERRING DIAG: Status epilepticus, muscle weakness  THERAPY DIAG:  Muscle weakness (generalized)  Other lack of coordination  Pain in left arm  Rationale for Evaluation and Treatment Rehabilitation  SUBJECTIVE:   SUBJECTIVE STATEMENT: Pt reports being tired today after walking in  from upstairs.    Pt accompanied by: daughter  PERTINENT HISTORY: Per chart, patient is a 51 y.o.  female with history of right parietal-occipital ICH 2009-seizure disorder, HTN-presented with status epilepticus with Todd's paresis.  Patient was intubated on presentation for airway protection-admitted to the ICU-neurology consulted-antiepileptics adjusted-upon stability-transfer to [facility] service.  PRECAUTIONS: Other: hx of seizures  WEIGHT BEARING RESTRICTIONS No, but pt self limits WB to L hand d/t severe pain in L wrist with any mobility or pressure  PAIN:  Are you having pain? 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  OBJECTIVE:   FUNCTIONAL OUTCOME MEASURES: FOTO: 52  UPPER EXTREMITY ROM     Active ROM Right eval Left eval  Shoulder flexion WNL 90 (90)  Shoulder abduction WNL NT d/t severe pain after lifting arm into flexion  Shoulder adduction    Shoulder extension    Shoulder internal rotation    Shoulder external rotation  NT (limited by pain)  Elbow flexion  WNL  Elbow extension  WNL  Wrist flexion  WNL (painful)  Wrist extension  WNL (painful)  (Blank rows = not tested)   UPPER EXTREMITY MMT:    RUE 5/5; deferred LUE MMT d/t significant pain just with attempts at AROM   HAND FUNCTION: Grip strength: Right: 71 lbs; Left: 16 lbs, Lateral pinch: Right: 19 lbs, Left: 8 lbs, and 3 point pinch: Right: 15 lbs, Left: 7 lbs  COORDINATION: Finger Nose Finger test: mildy impaired LUE 9 Hole Peg  test: Right: 24 sec; Left: 40 sec  SENSATION: Light touch: Impaired tingling/numbness sensation throughout LUE   TODAY'S TREATMENT:  Therapeutic Exercise: Performed active scapular elevation and retraction x10 bilaterally.  Performed dowel climbs to facilitate L shoulder flexion x5 reps.  In sitting, completed 2 min forward and 2 min reverse rotation on UBE, working to increase strength and mobility throughout the LUE.  Tolerated fairly well without resistance.  Facilitated grip strengthening with use of hand gripper on moderate resistance with 1 green band to complete 3 sets 10 reps of grip squeezes beginning of session, and 2 more sets of 10 end of session.  Cued to rest between sets.  Completed 1.5# dowel exercises for BUE strengthening, including bilat shoulder flex, abd, ER to top of head, and in standing pt performed shoulder extension and IR behind back, min vc for technique and form; pt completed 2 sets 10 reps each.    Neuro re-ed: Participation in L/R discrimination activity circling L hands in a magazine.  OT graded activity for further challenge, having pt identify L hand images with a 180 degree and then 270 degree turn of the magazine.  Increased time needed with each; requiring intermittent cues for accuracy.  Participated in balloon batting using LUE to promote forward flexion and diagonal reaching patterns; frequent rest breaks needed.  Targeted L/R discrimination with OT calling out L/R/both for pt to return balloon using 1 or both arms.   PATIENT EDUCATION: Education details: LUE loading activities, R/L discrimination activities, GMI activities  Person educated: Patient and Child(ren) Education method: Explanation and Verbal cues, demo Education comprehension: verbalized understanding and needs further education   HOME EXERCISE PROGRAM: Instructed briefly in table slides for L shoulder mobility; pt able to return demo.  Encouraged pt complete within a tolerable ROM.  Pt  verbalized understanding.   GOALS: Goals reviewed with patient? Yes  SHORT TERM GOALS: Target date: 08/19/22  Pt will be indep to perform HEP for increasing LUE strength and coordination.  Baseline: Eval: initiated table slides; will progress with additional visits Goal status: INITIAL  2.  Pt will tolerate manual therapy, therapeutic exercises, and modalities as needed for decreasing pain throughout the LUE to <5/10 with activity Baseline: Eval 8-10/10 pain in LUE with activity Goal status: INITIAL  LONG TERM GOALS: Target date: 09/30/22  Pt will increase FOTO score to 60 or greater to indicate increase in perceived performance with daily tasks.  Baseline: Eval: 52 Goal status: INITIAL  2.  Pt will increase L grip strength by 20 or more lbs to enable pt to hold and carry ADL supplies without dropping. Baseline: Eval: L grip 16 (R 71); pt uses R hand to hold and carry ADL supplies Goal status: INITIAL  3.  Pt will increase L shoulder flexibility to enable reaching into overhead cupboards.  Baseline: Eval: L active shoulder flex 90 degrees (passive also 90) Goal status: INITIAL  4.  Pt will increase L hand dexterity to be able to hook her bra. Baseline: Eval: spouse hooks  bra Goal status: INITIAL  ASSESSMENT:  CLINICAL IMPRESSION: Pt steadily reporting decreased pain throughout the LUE, noting 5/10 pain today.  Pt was able to progress grip strengthening exercises from mild to moderate resistance with 1 green band.  Good tolerance.  Pt with good tolerance to cane exercises today with 1.5# weight, noting good symmetry and consistently reaching above shoulder level today (~110*).   Progressed L/R discrimination activities with magazine activity, turning magazine 180 and 270* to identify L hand images with intermittent min vc for accuracy.  LUE remains weak and stiffness continues to be noted through the L shoulder, but pt is progressing well with her attempts at engaging the LUE into  daily tasks.  Pt will continue to benefit from skilled OT to address chronic pain, weakness, and coordination deficits throughout her LUE in order to better engage the LUE with daily tasks.    PERFORMANCE DEFICITS in functional skills including ADLs, IADLs, coordination, dexterity, sensation, ROM, strength, pain, flexibility, FMC, GMC, mobility, balance, endurance, decreased knowledge of use of DME, skin integrity, and UE functional use, cognitive skills including memory and attention.  IMPAIRMENTS are limiting patient from ADLs, IADLs, work, and leisure.   COMORBIDITIES may have co-morbidities  that affects occupational performance. Patient will benefit from skilled OT to address above impairments and improve overall function.  MODIFICATION OR ASSISTANCE TO COMPLETE EVALUATION: Min-Moderate modification of tasks or assist with assess necessary to complete an evaluation.  OT OCCUPATIONAL PROFILE AND HISTORY: Problem focused assessment: Including review of records relating to presenting problem.  CLINICAL DECISION MAKING: Moderate - several treatment options, min-mod task modification necessary  REHAB POTENTIAL: Good  EVALUATION COMPLEXITY: Moderate    PLAN: OT FREQUENCY: 2x/week  OT DURATION: 12 weeks  PLANNED INTERVENTIONS: self care/ADL training, therapeutic exercise, therapeutic activity, neuromuscular re-education, manual therapy, passive range of motion, balance training, functional mobility training, paraffin, moist heat, cryotherapy, contrast bath, patient/family education, cognitive remediation/compensation, and DME and/or AE instructions  RECOMMENDED OTHER SERVICES: Pt has referral for PT  CONSULTED AND AGREED WITH PLAN OF CARE: Patient and family member/caregiver  PLAN FOR NEXT SESSION: see poc  Danelle Earthly, MS, OTR/L  Otis Dials, OT 07/27/2022, 2:58 PM

## 2022-07-27 NOTE — Assessment & Plan Note (Signed)
Stable at the present time. 

## 2022-07-27 NOTE — Progress Notes (Signed)
Established Patient Office Visit  Subjective:  Patient ID: [name], female    DOB: [birth year]  Age: 51 y.o. MRN: [id number]  CC:  Chief Complaint  Patient presents with   Follow-up    HPI  [name] presents for check u 40p treatment retaining about medicine aortic catheter,  Past Medical History:  Diagnosis Date   Brain aneurysm    Common migraine with intractable migraine 03/03/2017   Headache    Hemiplegic migraine 10/07/2015   Late effects of cerebral ischemic stroke 02/09/2018   Left hemiparesis   Primary hypertension 12/06/2020   Seizures ([place])     Past Surgical History:  Procedure Laterality Date   ABDOMINAL HYSTERECTOMY      Family History  Problem Relation Age of Onset   Stroke Mother    Hypertension Father    Diabetes Father    Migraines Sister    Migraines Brother    Migraines Sister    Migraines Sister    Migraines Sister    Migraines Sister    Migraines Sister    Hypertension Paternal Aunt    Diabetes Paternal Aunt    Stroke Paternal Aunt    Hypertension Paternal Uncle    Diabetes Paternal Uncle    Stroke Paternal Uncle    Hypertension Maternal Grandmother    Aneurysm Paternal Grandmother    Diabetes Paternal Grandmother    Hypertension Paternal Grandfather    Cancer Paternal Grandfather    Seizures Cousin     Social History   Socioeconomic History   Marital status: Married    Spouse name: Not on file   Number of children: 3   Years of education: 11   Highest education level: Not on file  Occupational History   Occupation: Geophysicist/field seismologist  Tobacco Use   Smoking status: Former    Packs/day: 0.50    Types: Cigarettes    Quit date: 05/2022    Years since quitting: 0.1   Smokeless tobacco: Never  Substance and Sexual Activity   Alcohol use: Yes    Alcohol/week: 0.0 standard drinks of alcohol    Comment: occasionally   Drug use: No   Sexual activity: Not on file  Other Topics Concern   Not on file  Social  History Narrative   Patient drink about 2 cups of caffeine daily.   Patient is right handed.    Social Determinants of Health   Financial Resource Strain: Not on file  Food Insecurity: Not on file  Transportation Needs: Not on file  Physical Activity: Not on file  Stress: Not on file  Social Connections: Not on file  Intimate Partner Violence: Not on file     Current Outpatient Medications:    albuterol (PROVENTIL HFA;VENTOLIN HFA) 108 (90 Base) MCG/ACT inhaler, Inhale 2 puffs into the lungs every 6 (six) hours as needed for wheezing or shortness of breath., Disp: 1 Inhaler, Rfl: 0   ibuprofen (ADVIL) 200 MG tablet, Take 200 mg by mouth as needed (pain)., Disp: , Rfl:    levETIRAcetam (KEPPRA) 750 MG tablet, Take 2 tablets (1,500 mg total) by mouth 2 (two) times daily., Disp: 120 tablet, Rfl: 3   zonisamide (ZONEGRAN) 100 MG capsule, One capsule in the morning and 2 at night, Disp: 270 capsule, Rfl: 3   Allergies  Allergen Reactions   Amoxicillin Anaphylaxis   Fish Allergy Anaphylaxis   Morphine And Related Anaphylaxis   Penicillins Anaphylaxis    Has patient had  a PCN reaction causing immediate rash, facial/tongue/throat swelling, SOB or lightheadedness with hypotension: Yes Has patient had a PCN reaction causing severe rash involving mucus membranes or skin necrosis: Yes Has patient had a PCN reaction that required hospitalization Yes Has patient had a PCN reaction occurring within the last 10 years: Yes If all of the above answers are "NO", then may proceed with Cephalosporin use.    Rocephin [Ceftriaxone Sodium In Dextrose] Anaphylaxis   Shellfish Allergy Anaphylaxis and Itching   Tylenol [Acetaminophen] Anaphylaxis   Topamax [Topiramate] Nausea And Vomiting   Tramadol Other (See Comments)    Unknown per SO    ROS Review of Systems  Constitutional: Negative.   HENT: Negative.    Eyes: Negative.   Respiratory: Negative.    Cardiovascular: Negative.    Gastrointestinal: Negative.   Endocrine: Negative.   Genitourinary: Negative.   Musculoskeletal: Negative.   Skin: Negative.   Allergic/Immunologic: Negative.   Neurological: Negative.   Hematological: Negative.   Psychiatric/Behavioral: Negative.    All other systems reviewed and are negative.     Objective:    Physical Exam Vitals reviewed.  Constitutional:      General: She is not in acute distress.    Appearance: Normal appearance. She is obese. She is not ill-appearing, toxic-appearing or diaphoretic.  HENT:     Head: Normocephalic.     Right Ear: Tympanic membrane normal.     Mouth/Throat:     Mouth: Mucous membranes are moist.  Eyes:     Pupils: Pupils are equal, round, and reactive to light.  Neck:     Vascular: No carotid bruit.  Cardiovascular:     Rate and Rhythm: Normal rate and regular rhythm.     Pulses: Normal pulses.     Heart sounds: Normal heart sounds.  Pulmonary:     Effort: Pulmonary effort is normal.     Breath sounds: Normal breath sounds.  Abdominal:     General: Bowel sounds are normal.     Palpations: Abdomen is soft. There is no hepatomegaly, splenomegaly or mass.     Tenderness: There is no abdominal tenderness.     Hernia: No hernia is present.  Musculoskeletal:        General: No tenderness.     Cervical back: Neck supple.     Right lower leg: No edema.     Left lower leg: No edema.  Skin:    Findings: No rash.  Neurological:     General: No focal deficit present.     Mental Status: She is alert and oriented to person, place, and time. Mental status is at baseline.     Gait: Gait normal.  Psychiatric:        Mood and Affect: Mood and affect normal.        Behavior: Behavior normal.        Thought Content: Thought content normal.        Judgment: Judgment normal.     BP 138/85   Pulse 80   Ht 5\' 1"  (1.549 m)   Wt 211 lb 8 oz (95.9 kg)   BMI 39.96 kg/m  Wt Readings from Last 3 Encounters:  07/27/22 211 lb 8 oz (95.9 kg)   07/13/22 213 lb 4.8 oz (96.8 kg)  06/24/22 223 lb 1.7 oz (101.2 kg)     Health Maintenance Due  Topic Date Due   Hepatitis C Screening  Never done   TETANUS/TDAP  Never done   PAP SMEAR-Modifier  Never done   COLONOSCOPY (Pts 45-21yrs Insurance coverage will need to be confirmed)  Never done   MAMMOGRAM  Never done    There are no preventive care reminders to display for this patient.  No results found for: "TSH" Lab Results  Component Value Date   WBC 14.3 (H) 06/29/2022   HGB 12.0 06/29/2022   HCT 37.7 06/29/2022   MCV 81.6 06/29/2022   PLT 338 06/29/2022   Lab Results  Component Value Date   NA 143 06/29/2022   K 4.1 06/29/2022   CO2 21 (L) 06/29/2022   GLUCOSE 113 (H) 06/29/2022   BUN 12 06/29/2022   CREATININE 0.84 06/29/2022   BILITOT 0.7 06/24/2022   ALKPHOS 88 06/24/2022   AST 31 06/24/2022   ALT 29 06/24/2022   PROT 7.1 06/24/2022   ALBUMIN 3.8 06/24/2022   CALCIUM 9.1 06/29/2022   ANIONGAP 11 06/29/2022   No results found for: "CHOL" No results found for: "HDL" No results found for: "LDLCALC" Lab Results  Component Value Date   TRIG 171 (H) 06/25/2022   No results found for: "CHOLHDL" Lab Results  Component Value Date   HGBA1C 6.5 (H) 06/24/2022  1. Common migraine with intractable migraine Stable at the present time  2. Hypertensive heart disease without heart failure  Patient denies any chest pain or shortness of breath there is no history of palpitation or paroxysmal nocturnal dyspnea   patient was advised to follow low-salt low-cholesterol diet    ideally I want to keep systolic blood pressure below 010 mmHg, patient was asked to check blood pressure one times a week and give me a report on that.  Patient will be follow-up in 3 months  or earlier as needed, patient will call me back for any change in the cardiovascular symptoms Patient was advised to buy a book from local bookstore concerning blood pressure and read several chapters  every  day.  This will be supplemented by some of the material we will give him from the office.  Patient should also utilize other resources like YouTube and Internet to learn more about the blood pressure and the diet.  3. Obesity (BMI 30.0-34.9) - I encouraged the patient to lose weight.  - I educated them on making healthy dietary choices including eating more fruits and vegetables and less fried foods. - I encouraged the patient to exercise more, and educated on the benefits of exercise including weight loss, diabetes prevention, and hypertension prevention.   Dietary counseling with a registered dietician  Referral to a weight management support group (e.g. Weight Watchers, Overeaters Anonymous)  If your BMI is greater than 29 or you have gained more than 15 pounds you should work on weight loss.  Attend a healthy cooking class  4. Seizures (HCC) Stable  5. Hx of cerebral infarction Glucose  6. Prediabetes Patient was advised to lose weight    Assessment & Plan:     No orders of the defined types were placed in this encounter.   Follow-up: No follow-ups on file.    Corky Downs, MD

## 2022-07-29 ENCOUNTER — Ambulatory Visit: Payer: Commercial Managed Care - PPO

## 2022-07-29 DIAGNOSIS — M79605 Pain in left leg: Secondary | ICD-10-CM

## 2022-07-29 DIAGNOSIS — R2681 Unsteadiness on feet: Secondary | ICD-10-CM

## 2022-07-29 DIAGNOSIS — M6281 Muscle weakness (generalized): Secondary | ICD-10-CM

## 2022-07-29 DIAGNOSIS — R278 Other lack of coordination: Secondary | ICD-10-CM

## 2022-07-29 NOTE — Therapy (Signed)
OUTPATIENT PHYSICAL THERAPY NEURO TREATMENT   Patient Name: [name] MRN: [id number] DOB:1971/04/20, 51 y.o., female Today's Date: 07/29/2022   PCP: Cletis Athens, MD REFERRING PROVIDER: Jonetta Osgood, MD   PT End of Session - 07/29/22 1514     Visit Number 3    Number of Visits 25    Date for PT Re-Evaluation 10/13/22    PT Start Time 4403    PT Stop Time 1559    PT Time Calculation (min) 44 min    Equipment Utilized During Treatment Gait belt    Activity Tolerance Patient limited by pain    Behavior During Therapy [facility] for tasks assessed/performed               Past Medical History:  Diagnosis Date   Brain aneurysm    Common migraine with intractable migraine 03/03/2017   Headache    Hemiplegic migraine 10/07/2015   Late effects of cerebral ischemic stroke 02/09/2018   Left hemiparesis   Primary hypertension 12/06/2020   Seizures ([place])    Past Surgical History:  Procedure Laterality Date   ABDOMINAL HYSTERECTOMY     Patient Active Problem List   Diagnosis Date Noted   Acute respiratory failure ([place])    Leukocytosis    Status epilepticus ([place]) 06/24/2022   History of hemorrhagic stroke with residual hemiparesis ([place]) 01/27/2021   Prediabetes 12/06/2020   Obesity (BMI 30.0-34.9) 12/06/2020   Chronic pain 08/09/2020   Headache    Hx of cerebral infarction 06/24/2020   Nontraumatic cortical hemorrhage of right cerebral hemisphere ([place]) 06/12/2020   Hypertensive heart disease without heart failure 06/05/2020   Common migraine with intractable migraine 03/03/2017   Seizures ([place]) 10/07/2015   Hemiplegic migraine 10/07/2015    ONSET DATE: 06/24/2022  REFERRING DIAG:  G40.901 (ICD-10-CM) - Status epilepticus ([place])  Z86.73 (ICD-10-CM) - Hx of cerebral infarction  G89.4 (ICD-10-CM) - Chronic pain syndrome  J96.01 (ICD-10-CM) - Acute respiratory failure with hypoxia (HCC)    THERAPY DIAG:  Muscle weakness (generalized)  Pain in left  leg  Unsteadiness on feet  Rationale for Evaluation and Treatment Rehabilitation  SUBJECTIVE:                                                                                                                                                                                              SUBJECTIVE STATEMENT: Patient presents with daughter from OT session. Is open to seeing pain clinic.   Pt accompanied by: family member - daughter   PERTINENT HISTORY:  Pt is a pleasant 51 yo female recently seen in ED 06/24/2022-/06/30/2022 for seizure disorder.  Pertinent hx taken from chart and confirmed with pt: pt is  s/p "right parietal-occipital ICH 2009-seizure disorder, HTN-presented with status epilepticus with Todd's paresis.  Patient was intubated on presentation for airway protection-admitted to the ICU-neurology consulted-antiepileptics adjusted-upon stability-transfer to [facility] service." Other PMH per chart significant for hemiplegic migraine, hypertensive heart disease without heart failure, nontraumatic cortical hemorrhage of R cerebral hemisphere, hx cerebral infarction, chronic pain, obesity, prediabetes  PAIN:  Are you having pain? Yes: NPRS scale: rates 6 /10 Pain location: Predominantly L side (throughout UE, low back, and down LLE to foot) but can have pain on the R Pain description: can feel sharp, at times, burning Aggravating factors: cold, doing too much Relieving factors: warming herself up if she is cold Worst pain can get up to a 10/10. Pt reports otherwise constant level of pain. Can get to a 0/10 when warm.  PRECAUTIONS: Fall and Other:  seizures  WEIGHT BEARING RESTRICTIONS No  FALLS: Has patient fallen in last 6 months? Yes. Number of falls 2  LIVING ENVIRONMENT: taken from chart and confirmed with pt Lives with: lives with their family, lives with their spouse, and 3 adult children and 1 grandchild Lives in: House/apartment Stairs: No Has following equipment at home: Dan Humphreys  - 2 wheeled, [facility], bedside commode, has a grab bar in the shower   PLOF: Independent Former full-time Chief Executive Officer  PATIENT GOALS  wants to "get back to everything," improve walking, improve balance.   OBJECTIVE:   DIAGNOSTIC FINDINGS:  06/24/2022 CT ANGEIO HEAD NECK W WO CM W PERF: "IMPRESSION: 1. Patent vasculature of the head and neck with no hemodynamically significant stenosis, occlusion, or dissection. 2. No infarct core or penumbra identified on CT perfusion. 3. Mucoid impaction in some right upper lobe airways without postobstructive atelectasis."  06/24/2022 CT HEAD CODE STROKE WO CONTRAST: "IMPRESSION: 1. Negative CT head 2. ASPECTS is 10 3. Code stroke imaging results were communicated on 06/24/2022 at 1:15 pm to provider Selina Cooley via amion page"  COGNITION: Overall cognitive status:  reports since recent hospitalization she feels foggy headed and can get distracted and have trouble remember what she was doing    SENSATION: Reports n/t in feet/hands describes as burning, reports is constant In tact to light touch in BLE but LLE is TTP   COORDINATION: UE coordination WNL impaired LLE due to pain, RLE WNL  EDEMA:  Reports L hand can swell at times when resting    Palpation: Extremely TTP throughout entire LLE. At times pt becomes teary with testing   LOWER EXTREMITY ROM:    Testing limited today secondary to significant LLE pain (generalized), causing guarding/limiting pt's mobility  LOWER EXTREMITY MMT:     RLE grossly 4+/5 exceptions include hip flexors and HIP IR/ER which was slighlty pain limited (4/5)  LLE- unable to complete formal testing due to significant pain limitation. Most painful with adduction.    BED MOBILITY:  Impaired per pt. Difficulty with rolling, getting up from low surface.  TRANSFERS: Assistive device utilized:  Pt declines using an AD although this has been recommended to pt   Sit to stand:  Modified independence and SBA Stand to sit: Modified independence    STAIRS: Pt and daughter report difficulty with stairs  GAIT: Gait pattern:  decreased gait speed, antalgic, decreased heel strike B, however, more notable on R than L   Distance walked: length of clinic, and formal testing over 10 meters Assistive device utilized:  pt declines use  of AD Level of assistance: CGA Comments: pt gait pattern indicates fall risk, requires close CGA, pt declines use of AD despite recommendation  FUNCTIONAL TESTs:  5 times sit to stand: 16 seconds use of UE to assist 10 meter walk test: 0.76 m/s no AD    PATIENT SURVEYS:  ABC scale 53.12% FOTO 53 (goal score 61)  TODAY'S TREATMENT:  TherEx:  Supine: SAQ 10x each LE over bolster Posterior pelvic tilt: termianted due to pain   Prone: STM to bilateral lumbar paraspinals x 6 minutes   Sit to stand from elevated chair x10; one near LOB    Seated  TrA contraction into chair 10x 5 second holds  RTB abduction 15x Adduction ball squeeze 10x  RTB modified march 10x ; terminated.  Isometric flexion 10x each LE   Trigger Point Dry Needling (TDN), unbilled Education performed with patient regarding potential benefit of TDN. Reviewed precautions and risks with patient. Reviewed special precautions/risks over lung fields which include pneumothorax. Reviewed signs and symptoms of pneumothorax and advised pt to go to ER immediately if these symptoms develop advise them of dry needling treatment. Extensive time spent with pt to ensure full understanding of TDN risks. Pt provided verbal consent to treatment. TDN performed to  with 0.25 x 40 single needle placements with local twitch response (LTR). Pistoning technique utilized. Improved pain-free motion following intervention. Lumbar paraspinals and L glute x 3 minutes   PATIENT EDUCATION: Education details: goals, tests and measures, plan Person educated: Patient and Child(ren) Education  method: Explanation, Demonstration, Tactile cues, and Verbal cues Education comprehension: verbalized understanding, returned demonstration, verbal cues required, and needs further education   HOME EXERCISE PROGRAM: To be issued next 1-2 visits (session significantly limited today secondary to LLE pain)     GOALS: Goals reviewed with patient? Yes  SHORT TERM GOALS: Target date: 09/01/2022  Patient will be independent in home exercise program to improve strength/mobility for better functional independence with ADLs. Baseline: to be initiated Goal status: INITIAL   LONG TERM GOALS: Target date: 10/13/2022  Patient will increase FOTO score to equal to or greater than  61   to demonstrate statistically significant improvement in mobility and quality of life.  Baseline: 53 Goal status: INITIAL  2.   Patient (< 36 years old) will complete five times sit to stand test in < 10 seconds indicating an increased LE strength and improved balance. Baseline: 16 sec with UE assist Goal status: INITIAL  3.   Patient will increase 10 meter walk test to >1.64m/s as to improve gait speed for better community ambulation and to reduce fall risk. Baseline: 0.76 m/s no AD Goal status: INITIAL  4.   Patient will demonstrate an improved Berg Balance Score of > as to demonstrate improved balance with ADLs such as sitting/standing and transfer balance and reduced fall risk.  Baseline: to be performed next 1-2 visits Goal status: INITIAL  5. Patient will increase ABC scale score >80% to demonstrate better functional mobility and better confidence with ADLs.  Baseline: 53.12% Goal status: INITIAL   ASSESSMENT:  CLINICAL IMPRESSION: Messaged patient's PCP in regards to referral to pain clinic, have not heard back yet. Session continues to be limited by pain. Continue focus on pain reduction in combination with strengthening and stabilization performed. The pt will benefit from further skilled PT to  address these impairments in order to improve strength, balance, gait, mobility,  and QOL and to decrease fall risk.    OBJECTIVE IMPAIRMENTS Abnormal  gait, decreased activity tolerance, decreased balance, decreased coordination, decreased endurance, decreased knowledge of use of DME, decreased mobility, difficulty walking, decreased ROM, decreased strength, decreased safety awareness, dizziness, increased edema, increased muscle spasms, impaired sensation, impaired UE functional use, improper body mechanics, postural dysfunction, and pain.   ACTIVITY LIMITATIONS carrying, lifting, bending, standing, squatting, stairs, transfers, bed mobility, toileting, dressing, reach over head, hygiene/grooming, and locomotion level  PARTICIPATION LIMITATIONS: meal prep, cleaning, laundry, medication management, driving, shopping, community activity, occupation, and yard work  PERSONAL FACTORS Behavior pattern, Fitness, Past/current experiences, Sex, Time since onset of injury/illness/exacerbation, and 3+ comorbidities: Other PMH per chart significant for hemiplegic migraine, hypertensive heart disease without heart failure, nontraumatic cortical hemorrhage of R cerebral hemisphere, hx cerebral infarction, chronic pain, obesity, prediabetes  are also affecting patient's functional outcome.   REHAB POTENTIAL: Fair    CLINICAL DECISION MAKING: Evolving/moderate complexity  EVALUATION COMPLEXITY: High  PLAN: PT FREQUENCY: 2x/week  PT DURATION: 12 weeks  PLANNED INTERVENTIONS: Therapeutic exercises, Therapeutic activity, Neuromuscular re-education, Balance training, Gait training, Patient/Family education, Self Care, Joint mobilization, Stair training, Vestibular training, Canalith repositioning, Orthotic/Fit training, DME instructions, Dry Needling, Electrical stimulation, Wheelchair mobility training, Spinal mobilization, Cryotherapy, Moist heat, Splintting, Taping, Traction, Parrafin, Manual therapy, and  Re-evaluation  PLAN FOR NEXT SESSION: further assessment, issue HEP, strength, balance   Precious Bard, PT 07/29/2022, 5:03 PM

## 2022-07-30 NOTE — Therapy (Signed)
OUTPATIENT OCCUPATIONAL THERAPY NEURO TREATMENT  Patient Name: [name] MRN: [id number] DOB:[birth year], 51 y.o., female Today's Date: 07/30/2022  PCP: Dr. Corky Downs REFERRING PROVIDER: Dr. Corky Downs   OT End of Session - 07/30/22 1148     Visit Number 6    Number of Visits 24    Date for OT Re-Evaluation 09/30/22    Authorization Time Period Reporting period beginning 07/08/22    OT Start Time 1430    OT Stop Time 1515    OT Time Calculation (min) 45 min    Activity Tolerance Patient tolerated treatment well    Behavior During Therapy [facility] for tasks assessed/performed             Past Medical History:  Diagnosis Date   Brain aneurysm    Common migraine with intractable migraine 03/03/2017   Headache    Hemiplegic migraine 10/07/2015   Late effects of cerebral ischemic stroke 02/09/2018   Left hemiparesis   Primary hypertension 12/06/2020   Seizures (HCC)    Past Surgical History:  Procedure Laterality Date   ABDOMINAL HYSTERECTOMY     Patient Active Problem List   Diagnosis Date Noted   Acute respiratory failure (HCC)    Leukocytosis    Status epilepticus (HCC) 06/24/2022   History of hemorrhagic stroke with residual hemiparesis (HCC) 01/27/2021   Prediabetes 12/06/2020   Obesity (BMI 30.0-34.9) 12/06/2020   Chronic pain 08/09/2020   Headache    Hx of cerebral infarction 06/24/2020   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 Pt accompanied by: daughter  PERTINENT HISTORY: Per chart, patient is a 51 y.o.  female with history of right parietal-occipital ICH 2009-seizure disorder, HTN-presented with status epilepticus with Todd's paresis.  Patient was intubated on presentation for airway protection-admitted to the ICU-neurology consulted-antiepileptics adjusted-upon stability-transfer to [facility] service.  PRECAUTIONS: Other: hx of seizures  WEIGHT BEARING RESTRICTIONS No, but pt self limits WB to L hand d/t severe pain in L wrist with any mobility or pressure  PAIN:  Are you having pain? 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  OBJECTIVE:   FUNCTIONAL OUTCOME MEASURES: FOTO: 52  UPPER EXTREMITY ROM     Active ROM Right eval Left eval  Shoulder flexion WNL 90 (90)  Shoulder abduction WNL NT d/t severe pain after lifting arm into flexion  Shoulder adduction    Shoulder extension    Shoulder internal rotation    Shoulder external rotation  NT (limited by pain)  Elbow flexion  WNL  Elbow extension  WNL  Wrist flexion  WNL (painful)  Wrist extension  WNL (painful)  (Blank rows = not tested)   UPPER EXTREMITY MMT:    RUE 5/5; deferred LUE MMT d/t significant pain just with attempts at AROM   HAND FUNCTION: Grip strength: Right: 71 lbs; Left: 16 lbs, Lateral pinch: Right: 19 lbs, Left: 8 lbs, and 3 point pinch: Right: 15 lbs, Left: 7  lbs  COORDINATION: Finger Nose Finger test: mildy impaired LUE 9 Hole Peg test: Right: 24 sec; Left: 40 sec  SENSATION: Light touch: Impaired tingling/numbness sensation throughout LUE   TODAY'S TREATMENT:  Therapeutic Exercise: Performed active scapular elevation and retraction x10 bilaterally.  Performed slides for L shoulder flexion and abd onto wedge placed on table top; good tolerance.  OT adjusted placement of wedge on table top to achieve max tolerated stretch in both planes.  Performed dowel climbs to facilitate L shoulder flexion x5 reps.  Completed 1.5# dowel exercises for BUE strengthening, including bilat shoulder flex, abd, ER to top of head, and in standing pt performed shoulder extension and IR behind back, min vc for technique and form; pt completed 2 sets 10 reps each.  Attempted grip strengthening activity with pt removing jumbo pegs from pegboard on 6.6# grip setting with hand gripper, but pt completed a few only before having pain increase throughout LUE.  Pt stated that gripping tends to increase pain.    Neuro re-ed: Participation in L/R discrimination activity using Recognize hand app on phone; pt was tasked to identify L/R hand basic images, functional/context images using easy and medium level images.  Pt played memory game (easy and moderately difficult levels) matching various R/L hand images.  Pt requires extra time and scoring continues to show pt's accuracy to identify L/R is BNL (<80% accurate).  PATIENT EDUCATION: Education details: LUE loading activities, R/L discrimination activities, GMI activities  Person educated: Patient and Child(ren) Education method: Explanation and Verbal cues, demo Education comprehension: verbalized understanding and needs further education   HOME EXERCISE PROGRAM: Instructed briefly in table slides for L shoulder mobility; pt able to return demo.  Encouraged pt complete within a tolerable ROM.  Pt verbalized  understanding.   GOALS: Goals reviewed with patient? Yes  SHORT TERM GOALS: Target date: 08/19/22  Pt will be indep to perform HEP for increasing LUE strength and coordination.  Baseline: Eval: initiated table slides; will progress with additional visits Goal status: INITIAL  2.  Pt will tolerate manual therapy, therapeutic exercises, and modalities as needed for decreasing pain throughout the LUE to <5/10 with activity Baseline: Eval 8-10/10 pain in LUE with activity Goal status: INITIAL  LONG TERM GOALS: Target date: 09/30/22  Pt will increase FOTO score to 60 or greater to indicate increase in perceived performance with daily tasks.  Baseline: Eval: 52 Goal status: INITIAL  2.  Pt will increase L grip strength by 20 or more lbs to enable pt to hold and carry ADL supplies without dropping. Baseline: Eval: L grip 16 (R 71); pt uses R hand to hold and carry ADL supplies Goal status: INITIAL  3.  Pt will increase L shoulder flexibility to enable reaching into overhead cupboards.  Baseline: Eval: L active shoulder flex 90 degrees (passive also 90) Goal status: INITIAL  4.  Pt will increase L hand dexterity to be able to hook her bra. Baseline: Eval: spouse hooks bra Goal status: INITIAL  ASSESSMENT:  CLINICAL IMPRESSION: Pt steadily reporting decreased pain throughout the LUE,  noting 0/10 pain at rest today in L arm, and intermittent 3-5/10 pain with activity, mostly noted with gripping activities.  Pt is steadily increasing L active shoulder flexion, and was able to perform dowel climbs today to top of dowel with minimal difficulty.  Pt completed BUE strengthening with 1.5lb dowel with shoulder flexion ~140 this date.  LUE remains weak and stiffness continues to be noted through the L shoulder compared to RUE, but pt is progressing well with her attempts at engaging the LUE into daily tasks.  Pt will continue to benefit from skilled OT to address chronic pain, weakness, and  coordination deficits throughout her LUE in order to better engage the LUE with daily tasks.    PERFORMANCE DEFICITS in functional skills including ADLs, IADLs, coordination, dexterity, sensation, ROM, strength, pain, flexibility, FMC, GMC, mobility, balance, endurance, decreased knowledge of use of DME, skin integrity, and UE functional use, cognitive skills including memory and attention.  IMPAIRMENTS are limiting patient from ADLs, IADLs, work, and leisure.   COMORBIDITIES may have co-morbidities  that affects occupational performance. Patient will benefit from skilled OT to address above impairments and improve overall function.  MODIFICATION OR ASSISTANCE TO COMPLETE EVALUATION: Min-Moderate modification of tasks or assist with assess necessary to complete an evaluation.  OT OCCUPATIONAL PROFILE AND HISTORY: Problem focused assessment: Including review of records relating to presenting problem.  CLINICAL DECISION MAKING: Moderate - several treatment options, min-mod task modification necessary  REHAB POTENTIAL: Good  EVALUATION COMPLEXITY: Moderate    PLAN: OT FREQUENCY: 2x/week  OT DURATION: 12 weeks  PLANNED INTERVENTIONS: self care/ADL training, therapeutic exercise, therapeutic activity, neuromuscular re-education, manual therapy, passive range of motion, balance training, functional mobility training, paraffin, moist heat, cryotherapy, contrast bath, patient/family education, cognitive remediation/compensation, and DME and/or AE instructions  RECOMMENDED OTHER SERVICES: Pt has referral for PT  CONSULTED AND AGREED WITH PLAN OF CARE: Patient and family member/caregiver  PLAN FOR NEXT SESSION: see poc  Leta Speller, MS, OTR/L  Darleene Cleaver, OT 07/30/2022, 11:49 AM

## 2022-08-03 ENCOUNTER — Encounter: Payer: Self-pay | Admitting: Physical Therapy

## 2022-08-03 ENCOUNTER — Ambulatory Visit: Payer: Commercial Managed Care - PPO

## 2022-08-03 ENCOUNTER — Ambulatory Visit: Payer: Commercial Managed Care - PPO | Admitting: Occupational Therapy

## 2022-08-03 DIAGNOSIS — M6281 Muscle weakness (generalized): Secondary | ICD-10-CM

## 2022-08-03 DIAGNOSIS — M79605 Pain in left leg: Secondary | ICD-10-CM

## 2022-08-03 DIAGNOSIS — R262 Difficulty in walking, not elsewhere classified: Secondary | ICD-10-CM

## 2022-08-03 DIAGNOSIS — M79602 Pain in left arm: Secondary | ICD-10-CM

## 2022-08-03 DIAGNOSIS — R278 Other lack of coordination: Secondary | ICD-10-CM

## 2022-08-03 DIAGNOSIS — R2681 Unsteadiness on feet: Secondary | ICD-10-CM

## 2022-08-03 NOTE — Therapy (Signed)
OUTPATIENT PHYSICAL THERAPY NEURO TREATMENT   Patient Name: [name] MRN: [id number] DOB:[birth year], 51 y.o., female Today's Date: 08/03/2022   PCP: Corky Downs, MD REFERRING PROVIDER: Maretta Bees, MD   PT End of Session - 08/03/22 [phone]     Visit Number 4    Number of Visits 25    Date for PT Re-Evaluation 10/13/22    PT Start Time 0937    PT Stop Time 1015    PT Time Calculation (min) 38 min    Equipment Utilized During Treatment Gait belt    Activity Tolerance Patient limited by pain    Behavior During Therapy [facility] for tasks assessed/performed               Past Medical History:  Diagnosis Date   Brain aneurysm    Common migraine with intractable migraine 03/03/2017   Headache    Hemiplegic migraine 10/07/2015   Late effects of cerebral ischemic stroke 02/09/2018   Left hemiparesis   Primary hypertension 12/06/2020   Seizures (HCC)    Past Surgical History:  Procedure Laterality Date   ABDOMINAL HYSTERECTOMY     Patient Active Problem List   Diagnosis Date Noted   Acute respiratory failure (HCC)    Leukocytosis    Status epilepticus (HCC) 06/24/2022   History of hemorrhagic stroke with residual hemiparesis (HCC) 01/27/2021   Prediabetes 12/06/2020   Obesity (BMI 30.0-34.9) 12/06/2020   Chronic pain 08/09/2020   Headache    Hx of cerebral infarction 06/24/2020   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) 06/12/2020   Hypertensive heart disease without heart failure 06/05/2020   Common migraine with intractable migraine 03/03/2017   Seizures (HCC) 10/07/2015   Hemiplegic migraine 10/07/2015    ONSET DATE: 06/24/2022  REFERRING DIAG:  G40.901 (ICD-10-CM) - Status epilepticus (HCC)  Z86.73 (ICD-10-CM) - Hx of cerebral infarction  G89.4 (ICD-10-CM) - Chronic pain syndrome  J96.01 (ICD-10-CM) - Acute respiratory failure with hypoxia (HCC)    THERAPY DIAG:  Muscle weakness (generalized)  Pain in left  leg  Unsteadiness on feet  Other lack of coordination  Difficulty in walking, not elsewhere classified  Rationale for Evaluation and Treatment Rehabilitation  SUBJECTIVE:                                                                                                                                                                                              SUBJECTIVE STATEMENT: Pt reports 6/10 pain this date. Did get some relief with pillow underneath her LE's in supine . Reports good day with her pain. Primarily in L hip.  Pt accompanied by: family member - daughter   PERTINENT HISTORY:  Pt is a pleasant 51 yo female recently seen in ED 06/24/2022-/06/30/2022 for seizure disorder. Pertinent hx taken from chart and confirmed with pt: pt is  s/p "right parietal-occipital ICH 2009-seizure disorder, HTN-presented with status epilepticus with Todd's paresis.  Patient was intubated on presentation for airway protection-admitted to the ICU-neurology consulted-antiepileptics adjusted-upon stability-transfer to [facility] service." Other PMH per chart significant for hemiplegic migraine, hypertensive heart disease without heart failure, nontraumatic cortical hemorrhage of R cerebral hemisphere, hx cerebral infarction, chronic pain, obesity, prediabetes  PAIN:  Are you having pain? Yes: NPRS scale: rates 6 /10 Pain location: Predominantly L side (throughout UE, low back, and down LLE to foot) but can have pain on the R Pain description: can feel sharp, at times, burning Aggravating factors: cold, doing too much Relieving factors: warming herself up if she is cold Worst pain can get up to a 10/10. Pt reports otherwise constant level of pain. Can get to a 0/10 when warm.  PRECAUTIONS: Fall and Other:  seizures  WEIGHT BEARING RESTRICTIONS No  FALLS: Has patient fallen in last 6 months? Yes. Number of falls 2  LIVING ENVIRONMENT: taken from chart and confirmed with pt Lives with: lives with their  family, lives with their spouse, and 3 adult children and 1 grandchild Lives in: House/apartment Stairs: No Has following equipment at home: [employer] Rile - 2 wheeled, [facility], bedside commode, has a grab bar in the shower   PLOF: Independent Former full-time Tree surgeon  PATIENT GOALS  wants to "get back to everything," improve walking, improve balance.   OBJECTIVE:   DIAGNOSTIC FINDINGS:  06/24/2022 CT ANGEIO HEAD NECK W WO CM W PERF: "IMPRESSION: 1. Patent vasculature of the head and neck with no hemodynamically significant stenosis, occlusion, or dissection. 2. No infarct core or penumbra identified on CT perfusion. 3. Mucoid impaction in some right upper lobe airways without postobstructive atelectasis."  06/24/2022 CT HEAD CODE STROKE WO CONTRAST: "IMPRESSION: 1. Negative CT head 2. ASPECTS is 10 3. Code stroke imaging results were communicated on 06/24/2022 at 1:15 pm to provider Quinn Axe via [place] page"  COGNITION: Overall cognitive status:  reports since recent hospitalization she feels foggy headed and can get distracted and have trouble remember what she was doing    SENSATION: Reports n/t in feet/hands describes as burning, reports is constant In tact to light touch in BLE but LLE is TTP   COORDINATION: UE coordination WNL impaired LLE due to pain, RLE WNL  EDEMA:  Reports L hand can swell at times when resting    Palpation: Extremely TTP throughout entire LLE. At times pt becomes teary with testing   LOWER EXTREMITY ROM:    Testing limited today secondary to significant LLE pain (generalized), causing guarding/limiting pt's mobility  LOWER EXTREMITY MMT:     RLE grossly 4+/5 exceptions include hip flexors and HIP IR/ER which was slighlty pain limited (4/5)  LLE- unable to complete formal testing due to significant pain limitation. Most painful with adduction.    BED MOBILITY:  Impaired per pt. Difficulty with rolling, getting  up from low surface.  TRANSFERS: Assistive device utilized:  Pt declines using an AD although this has been recommended to pt   Sit to stand: Modified independence and SBA Stand to sit: Modified independence    STAIRS: Pt and daughter report difficulty with stairs  GAIT: Gait pattern:  decreased gait speed, antalgic, decreased heel strike B,  however, more notable on R than L   Distance walked: length of clinic, and formal testing over 10 meters Assistive device utilized:  pt declines use of AD Level of assistance: CGA Comments: pt gait pattern indicates fall risk, requires close CGA, pt declines use of AD despite recommendation  FUNCTIONAL TESTs:  5 times sit to stand: 16 seconds use of UE to assist 10 meter walk test: 0.76 m/s no AD    PATIENT SURVEYS:  ABC scale 53.12% FOTO 53 (goal score 61)  TODAY'S TREATMENT:  There.Ex:  Supine:  SAQ 10x each LE over bolster   AAROM L hip abduction with heel on towel: x10    LLE heel slide: x10   STS from mat table: 1x10 no resistance. Pain around reps 9 and 10. 1x8. Notable pain but pt pushes through.   Isometric add with LAQ: 2x10/LE.     Neuro Re-Ed:  Airex pad exercises:   Narrow BOS: x30 sec. Mild ankle strategy   Narrow BOS with EC: 3x30 sec. Postural sway AP throughout but no LOB. Improves on final two repetitions.   Side steps on airex beam: x8 laps d/b   Standing marches on airex: x10/LE. Requires single to BUE support due to LLE pain.    Frequent seated rest breaks b/t exercises due to LLE pain.     PATIENT EDUCATION: Education details: goals, tests and measures, plan Person educated: Patient and Child(ren) Education method: Explanation, Demonstration, Tactile cues, and Verbal cues Education comprehension: verbalized understanding, returned demonstration, verbal cues required, and needs further education   HOME EXERCISE PROGRAM: To be issued next 1-2 visits (session significantly limited today secondary  to LLE pain)     GOALS: Goals reviewed with patient? Yes  SHORT TERM GOALS: Target date: 09/01/2022  Patient will be independent in home exercise program to improve strength/mobility for better functional independence with ADLs. Baseline: to be initiated Goal status: INITIAL   LONG TERM GOALS: Target date: 10/13/2022  Patient will increase FOTO score to equal to or greater than  61   to demonstrate statistically significant improvement in mobility and quality of life.  Baseline: 53 Goal status: INITIAL  2.   Patient (< 34 years old) will complete five times sit to stand test in < 10 seconds indicating an increased LE strength and improved balance. Baseline: 16 sec with UE assist Goal status: INITIAL  3.   Patient will increase 10 meter walk test to >1.30m/s as to improve gait speed for better community ambulation and to reduce fall risk. Baseline: 0.76 m/s no AD Goal status: INITIAL  4.   Patient will demonstrate an improved Berg Balance Score of > as to demonstrate improved balance with ADLs such as sitting/standing and transfer balance and reduced fall risk.  Baseline: to be performed next 1-2 visits Goal status: INITIAL  5. Patient will increase ABC scale score >80% to demonstrate better functional mobility and better confidence with ADLs.  Baseline: 53.12% Goal status: INITIAL   ASSESSMENT:  CLINICAL IMPRESSION: Continuing PT POC with focus on balance and LLE strengthening. Pt tolerating more exercise this date and balance exercises but is still limited due to LLE pain that worsens with LLE active motion and standing activity. Most notable balance challenge appreicated this date with reduced BOS activities with gait on unstbale surfaces (tandem walking on airex beam) requiring ankle and hip strategy to correct LOB. Will continue POC as able to address deficits in pain, balance, LLE strengthening to optimize return to PLOF.  OBJECTIVE IMPAIRMENTS Abnormal gait,  decreased activity tolerance, decreased balance, decreased coordination, decreased endurance, decreased knowledge of use of DME, decreased mobility, difficulty walking, decreased ROM, decreased strength, decreased safety awareness, dizziness, increased edema, increased muscle spasms, impaired sensation, impaired UE functional use, improper body mechanics, postural dysfunction, and pain.   ACTIVITY LIMITATIONS carrying, lifting, bending, standing, squatting, stairs, transfers, bed mobility, toileting, dressing, reach over head, hygiene/grooming, and locomotion level  PARTICIPATION LIMITATIONS: meal prep, cleaning, laundry, medication management, driving, shopping, community activity, occupation, and yard work  PERSONAL FACTORS Behavior pattern, Fitness, Past/current experiences, Sex, Time since onset of injury/illness/exacerbation, and 3+ comorbidities: Other PMH per chart significant for hemiplegic migraine, hypertensive heart disease without heart failure, nontraumatic cortical hemorrhage of R cerebral hemisphere, hx cerebral infarction, chronic pain, obesity, prediabetes  are also affecting patient's functional outcome.   REHAB POTENTIAL: Fair    CLINICAL DECISION MAKING: Evolving/moderate complexity  EVALUATION COMPLEXITY: High  PLAN: PT FREQUENCY: 2x/week  PT DURATION: 12 weeks  PLANNED INTERVENTIONS: Therapeutic exercises, Therapeutic activity, Neuromuscular re-education, Balance training, Gait training, Patient/Family education, Self Care, Joint mobilization, Stair training, Vestibular training, Canalith repositioning, Orthotic/Fit training, DME instructions, Dry Needling, Electrical stimulation, Wheelchair mobility training, Spinal mobilization, Cryotherapy, Moist heat, Splintting, Taping, Traction, Parrafin, Manual therapy, and Re-evaluation  PLAN FOR NEXT SESSION: further assessment, issue HEP, strength, balance   Delphia Grates. Fairly IV, PT, DPT Physical Therapist- [place]   [facility]  08/03/2022, 10:33 AM

## 2022-08-05 ENCOUNTER — Ambulatory Visit: Payer: Commercial Managed Care - PPO | Admitting: Physical Therapy

## 2022-08-05 ENCOUNTER — Ambulatory Visit: Payer: Commercial Managed Care - PPO

## 2022-08-05 ENCOUNTER — Encounter: Payer: Self-pay | Admitting: Occupational Therapy
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
## 2022-08-10 ENCOUNTER — Ambulatory Visit: Payer: Commercial Managed Care - PPO

## 2022-08-10 ENCOUNTER — Ambulatory Visit: Payer: Commercial Managed Care - PPO | Admitting: Occupational Therapy

## 2022-08-13 ENCOUNTER — Ambulatory Visit: Payer: Commercial Managed Care - PPO

## 2022-08-17 ENCOUNTER — Ambulatory Visit: Payer: Commercial Managed Care - PPO

## 2022-08-17 NOTE — Progress Notes (Deleted)
Pt did not arrive for scheduled OT visit.  OT left message on pt's phone asking to return call and provided therapy clinic's number.  OT called and spoke with pt's daughter, Avon Gully, who reported that pt has not been to therapy d/t her leg bothering her.  OT asked daughter to remind pt that a phone call is needed to let clinic know if pt will not make it to a scheduled appointment so that she is not discharged d/t too many "no call, no shows."  Daughter agreed to remind pt.

## 2022-08-20 ENCOUNTER — Ambulatory Visit: Payer: Commercial Managed Care - PPO

## 2022-08-24 ENCOUNTER — Ambulatory Visit: Payer: Commercial Managed Care - PPO

## 2022-08-24 NOTE — Progress Notes (Deleted)
Called and left message with pt, and spoke to daughter, [name] to inform them that pt's OT/PT visits would be cancelled d/t 3 no shows.  Pt is dealing with a lot of family medical issues currently, and was encouraged to call back when she is able to re-start her therapy.  Notified pt and daughter that OT orders are good through Dec. 6, and past that a new order from the MD will be needed.  Daughter verbalized understanding.

## 2022-08-26 ENCOUNTER — Ambulatory Visit: Payer: Commercial Managed Care - PPO

## 2022-08-31 ENCOUNTER — Encounter: Payer: Self-pay | Admitting: Gastroenterology

## 2022-09-01 ENCOUNTER — Ambulatory Visit: Payer: Commercial Managed Care - PPO | Admitting: Certified Registered"

## 2022-09-01 ENCOUNTER — Ambulatory Visit
Admission: RE | Admit: 2022-09-01 | Discharge: 2022-09-01 | Disposition: A | Discharge status: Home / Self Care | Payer: Commercial Managed Care - PPO | Source: Ambulatory Visit | Attending: Gastroenterology | Admitting: Gastroenterology

## 2022-09-01 ENCOUNTER — Ambulatory Visit: Payer: Commercial Managed Care - PPO

## 2022-09-01 ENCOUNTER — Encounter
Admission: RE | Disposition: A | Discharge status: Home / Self Care | Payer: Self-pay | Source: Ambulatory Visit | Attending: Gastroenterology

## 2022-09-01 ENCOUNTER — Ambulatory Visit: Payer: Commercial Managed Care - PPO | Admitting: Internal Medicine

## 2022-09-01 DIAGNOSIS — I10 Essential (primary) hypertension: Secondary | ICD-10-CM | POA: Diagnosis not present

## 2022-09-01 DIAGNOSIS — G43019 Migraine without aura, intractable, without status migrainosus: Secondary | ICD-10-CM | POA: Insufficient documentation

## 2022-09-01 DIAGNOSIS — Z79899 Other long term (current) drug therapy: Secondary | ICD-10-CM | POA: Diagnosis not present

## 2022-09-01 DIAGNOSIS — Z1211 Encounter for screening for malignant neoplasm of colon: Secondary | ICD-10-CM | POA: Insufficient documentation

## 2022-09-01 DIAGNOSIS — I6930 Unspecified sequelae of cerebral infarction: Secondary | ICD-10-CM | POA: Diagnosis not present

## 2022-09-01 DIAGNOSIS — Z87891 Personal history of nicotine dependence: Secondary | ICD-10-CM | POA: Diagnosis not present

## 2022-09-01 DIAGNOSIS — K640 First degree hemorrhoids: Secondary | ICD-10-CM | POA: Diagnosis not present

## 2022-09-01 HISTORY — PX: COLONOSCOPY WITH PROPOFOL: SHX5780

## 2022-09-01 SURGERY — COLONOSCOPY WITH PROPOFOL
Anesthesia: General

## 2022-09-01 MED ORDER — PROPOFOL 500 MG/50ML IV EMUL
INTRAVENOUS | Status: DC | PRN
  Administered 2022-09-01: 165 ug/kg/min via INTRAVENOUS

## 2022-09-01 MED ORDER — PROPOFOL 10 MG/ML IV BOLUS
INTRAVENOUS | Status: DC | PRN
  Administered 2022-09-01: 30 mg via INTRAVENOUS
  Administered 2022-09-01: 70 mg via INTRAVENOUS

## 2022-09-01 MED ORDER — LIDOCAINE HCL (CARDIAC) PF 100 MG/5ML IV SOSY
PREFILLED_SYRINGE | INTRAVENOUS | Status: DC | PRN
  Administered 2022-09-01: 100 mg via INTRAVENOUS

## 2022-09-01 MED ORDER — SODIUM CHLORIDE 0.9 % IV SOLN
INTRAVENOUS | Status: DC
  Administered 2022-09-01: 20 mL/h via INTRAVENOUS

## 2022-09-01 MED ORDER — DEXMEDETOMIDINE HCL IN NACL 200 MCG/50ML IV SOLN
INTRAVENOUS | Status: DC | PRN
  Administered 2022-09-01 (×2): 12 ug via INTRAVENOUS

## 2022-09-01 MED ORDER — SIMETHICONE 40 MG/0.6ML PO SUSP
ORAL | Status: DC | PRN
  Administered 2022-09-01: 60 mL

## 2022-09-01 NOTE — H&P (Signed)
Jonathon Bellows, MD [address], [place], [place], [place], [address] [address], [place], [place], [place], [address] Phone: [phone]  Fax: [phone]  Primary Care Physician:  Cletis Athens, MD   Pre-Procedure History & Physical: HPI:  [name] is a 51 y.o. female is here for an colonoscopy.   Past Medical History:  Diagnosis Date   Brain aneurysm    Common migraine with intractable migraine 03/03/2017   Headache    Hemiplegic migraine 10/07/2015   Late effects of cerebral ischemic stroke 02/09/2018   Left hemiparesis   Primary hypertension 12/06/2020   Seizures ([place])     Past Surgical History:  Procedure Laterality Date   ABDOMINAL HYSTERECTOMY      Prior to Admission medications   Medication Sig Start Date End Date Taking? Authorizing Provider  albuterol (VENTOLIN HFA) 108 (90 Base) MCG/ACT inhaler Inhale 2 puffs into the lungs every 6 (six) hours as needed for wheezing or shortness of breath. 07/27/22  Yes Masoud, Viann Shove, MD  ibuprofen (ADVIL) 200 MG tablet Take 1 tablet (200 mg total) by mouth as needed (pain). 07/27/22  Yes Masoud, Viann Shove, MD  levETIRAcetam (KEPPRA) 750 MG tablet Take 2 tablets (1,500 mg total) by mouth 2 (two) times daily. 07/27/22  Yes Masoud, Viann Shove, MD  lisinopril (ZESTRIL) 10 MG tablet Take 10 mg by mouth daily.   Yes [provider]  zonisamide (ZONEGRAN) 100 MG capsule One capsule in the morning and 2 at night 08/14/20  Yes Kathrynn Ducking, MD    Allergies as of 07/17/2022 - Review Complete 07/13/2022  Allergen Reaction Noted   Amoxicillin Anaphylaxis 08/31/2014   Fish allergy Anaphylaxis 06/25/2022   Morphine and related Anaphylaxis 08/31/2014   Penicillins Anaphylaxis 08/31/2014   Rocephin [ceftriaxone sodium in dextrose] Anaphylaxis 08/30/2016   Shellfish allergy Anaphylaxis and Itching 08/31/2014   Tylenol [acetaminophen] Anaphylaxis 02/04/2016   Topamax [topiramate] Nausea And Vomiting 10/27/2015   Tramadol  Other (See Comments) 04/12/2018    Family History  Problem Relation Age of Onset   Stroke Mother    Hypertension Father    Diabetes Father    Migraines Sister    Migraines Brother    Migraines Sister    Migraines Sister    Migraines Sister    Migraines Sister    Migraines Sister    Hypertension Paternal Aunt    Diabetes Paternal Aunt    Stroke Paternal Aunt    Hypertension Paternal Uncle    Diabetes Paternal Uncle    Stroke Paternal Uncle    Hypertension Maternal Grandmother    Aneurysm Paternal Grandmother    Diabetes Paternal Grandmother    Hypertension Paternal Grandfather    Cancer Paternal Grandfather    Seizures Cousin     Social History   Socioeconomic History   Marital status: Married    Spouse name: Not on file   Number of children: 3   Years of education: 11   Highest education level: Not on file  Occupational History   Occupation: Geophysicist/field seismologist  Tobacco Use   Smoking status: Former    Packs/day: 0.50    Types: Cigarettes    Quit date: 05/2022    Years since quitting: 0.2   Smokeless tobacco: Never  Substance and Sexual Activity   Alcohol use: Yes    Alcohol/week: 0.0 standard drinks of alcohol    Comment: occasionally   Drug use: No   Sexual activity: Not on file  Other Topics Concern  Not on file  Social History Narrative   Patient drink about 2 cups of caffeine daily.   Patient is right handed.    Social Determinants of Health   Financial Resource Strain: Not on file  Food Insecurity: Not on file  Transportation Needs: Not on file  Physical Activity: Not on file  Stress: Not on file  Social Connections: Not on file  Intimate Partner Violence: Not on file    Review of Systems: See HPI, otherwise negative ROS  Physical Exam: BP (!) 132/90   Pulse 78   Temp (!) 97 F (36.1 C) (Temporal)   Resp 20   Ht 5' 4.5" (1.638 m)   Wt 95.3 kg   SpO2 99%   BMI 35.49 kg/m  General:   Alert,  pleasant and cooperative in NAD Head:   Normocephalic and atraumatic. Neck:  Supple; no masses or thyromegaly. Lungs:  Clear throughout to auscultation, normal respiratory effort.    Heart:  +S1, +S2, Regular rate and rhythm, No edema. Abdomen:  Soft, nontender and nondistended. Normal bowel sounds, without guarding, and without rebound.   Neurologic:  Alert and  oriented x4;  grossly normal neurologically.  Impression/Plan: [name] is here for an colonoscopy to be performed for Screening colonoscopy average risk   Risks, benefits, limitations, and alternatives regarding  colonoscopy have been reviewed with the patient.  Questions have been answered.  All parties agreeable.   Wyline Mood, MD  09/01/2022, 11:04 AM

## 2022-09-01 NOTE — Anesthesia Postprocedure Evaluation (Signed)
Anesthesia Post Note  Patient: [name]  Procedure(s) Performed: COLONOSCOPY WITH PROPOFOL  Patient location during evaluation: PACU Anesthesia Type: General Level of consciousness: awake and alert Pain management: pain level controlled Vital Signs Assessment: post-procedure vital signs reviewed and stable Respiratory status: spontaneous breathing, nonlabored ventilation, respiratory function stable and patient connected to nasal cannula oxygen Cardiovascular status: blood pressure returned to baseline and stable Postop Assessment: no apparent nausea or vomiting Anesthetic complications: no   No notable events documented.   Last Vitals:  Vitals:   09/01/22 1152 09/01/22 1158  BP: (!) 90/53 104/62  Pulse:    Resp:    Temp:    SpO2:      Last Pain:  Vitals:   09/01/22 1158  TempSrc:   PainSc: 0-No pain                 Molli Barrows

## 2022-09-01 NOTE — Transfer of Care (Signed)
Immediate Anesthesia Transfer of Care Note  Patient: [name]  Procedure(s) Performed: COLONOSCOPY WITH PROPOFOL  Patient Location: Endoscopy Unit  Anesthesia Type:General  Level of Consciousness: drowsy and patient cooperative  Airway & Oxygen Therapy: Patient Spontanous Breathing and Patient connected to face mask oxygen  Post-op Assessment: Report given to RN and Post -op Vital signs reviewed and stable  Post vital signs: Reviewed and stable  Last Vitals:  Vitals Value Taken Time  BP 106/93 09/01/22 1128  Temp 36.1 C 09/01/22 1128  Pulse 74 09/01/22 1129  Resp 15 09/01/22 1129  SpO2 100 % 09/01/22 1129  Vitals shown include unvalidated device data.  Last Pain:  Vitals:   09/01/22 1128  TempSrc: Temporal  PainSc: Asleep         Complications: No notable events documented.

## 2022-09-01 NOTE — Anesthesia Preprocedure Evaluation (Signed)
Anesthesia Evaluation  Patient identified by MRN, date of birth, ID band Patient awake    Reviewed: Allergy & Precautions, H&P , NPO status , Patient's Chart, lab work & pertinent test results, reviewed documented beta blocker date and time   Airway Mallampati: II   Neck ROM: full    Dental  (+) Poor Dentition   Pulmonary neg pulmonary ROS, former smoker   Pulmonary exam normal        Cardiovascular hypertension, negative cardio ROS Normal cardiovascular exam Rhythm:regular Rate:Normal     Neuro/Psych  Headaches, Seizures -,   negative psych ROS   GI/Hepatic negative GI ROS, Neg liver ROS,,,  Endo/Other  negative endocrine ROS    Renal/GU negative Renal ROS  negative genitourinary   Musculoskeletal   Abdominal   Peds  Hematology negative hematology ROS (+)   Anesthesia Other Findings Past Medical History: No date: Brain aneurysm 03/03/2017: Common migraine with intractable migraine No date: Headache 10/07/2015: Hemiplegic migraine 02/09/2018: Late effects of cerebral ischemic stroke     Comment:  Left hemiparesis 12/06/2020: Primary hypertension No date: Seizures ([place]) Past Surgical History: No date: ABDOMINAL HYSTERECTOMY   Reproductive/Obstetrics negative OB ROS                             Anesthesia Physical Anesthesia Plan  ASA: 3  Anesthesia Plan: General   Post-op Pain Management:    Induction:   PONV Risk Score and Plan:   Airway Management Planned:   Additional Equipment:   Intra-op Plan:   Post-operative Plan:   Informed Consent: I have reviewed the patients History and Physical, chart, labs and discussed the procedure including the risks, benefits and alternatives for the proposed anesthesia with the patient or authorized representative who has indicated his/her understanding and acceptance.     Dental Advisory Given  Plan Discussed with: CRNA  Anesthesia  Plan Comments:        Anesthesia Quick Evaluation

## 2022-09-01 NOTE — Op Note (Signed)
[facility] Patient Name: [name] Procedure Date: 09/01/2022 10:59 AM MRN: [id number] Account #: [id number] Date of Birth: 08-Oct-1971 Admit Type: Outpatient Age: 51 Room: [facility] ENDO ROOM 2 Gender: Female Note Status: Finalized Instrument Name: Prentice Docker [id number] Procedure:             Colonoscopy Indications:           Screening for colorectal malignant neoplasm Providers:             Wyline Mood MD, MD Referring MD:          Corky Downs, MD (Referring MD) Medicines:             Monitored Anesthesia Care Complications:         No immediate complications. Procedure:             Pre-Anesthesia Assessment:                        - Prior to the procedure, a History and Physical was                         performed, and patient medications, allergies and                         sensitivities were reviewed. The patient's tolerance                         of previous anesthesia was reviewed.                        - The risks and benefits of the procedure and the                         sedation options and risks were discussed with the                         patient. All questions were answered and informed                         consent was obtained.                        - ASA Grade Assessment: II - A patient with mild                         systemic disease.                        After obtaining informed consent, the colonoscope was                         passed under direct vision. Throughout the procedure,                         the patient's blood pressure, pulse, and oxygen                         saturations were monitored continuously. The                         Colonoscope was introduced  through the anus and                         advanced to the the cecum, identified by the                         appendiceal orifice. The colonoscopy was performed                         with ease. The patient tolerated the procedure well.                          The quality of the bowel preparation was excellent.                         The appendiceal orifice was photographed. Findings:      The entire examined colon appeared normal on direct and retroflexion       views.      Non-bleeding internal hemorrhoids were found during retroflexion. The       hemorrhoids were large and Grade I (internal hemorrhoids that do not       prolapse). Impression:            - The entire examined colon is normal on direct and                         retroflexion views.                        - No specimens collected. Recommendation:        - Repeat colonoscopy in 10 years for screening                         purposes. Procedure Code(s):     --- Professional ---                        [phone], Colonoscopy, flexible; diagnostic, including                         collection of specimen(s) by brushing or washing, when                         performed (separate procedure) Diagnosis Code(s):     --- Professional ---                        Z12.11, Encounter for screening for malignant neoplasm                         of colon CPT copyright 2022 [employer]. All rights reserved. The codes documented in this report are preliminary and upon coder review may  be revised to meet current compliance requirements. Jonathon Bellows, MD Jonathon Bellows MD, MD 09/01/2022 11:25:34 AM This report has been signed electronically. Number of Addenda: 0 Note Initiated On: 09/01/2022 10:59 AM Scope Withdrawal Time: 0 hours 6 minutes 30 seconds  Total Procedure Duration: 0 hours 11 minutes 38 seconds  Estimated Blood Loss:  Estimated blood loss: none. Estimated blood loss: none.      [facility]

## 2022-09-02 ENCOUNTER — Encounter: Payer: Self-pay | Admitting: Gastroenterology

## 2022-09-03 ENCOUNTER — Encounter: Payer: Commercial Managed Care - PPO | Admitting: Occupational Therapy

## 2022-09-03 ENCOUNTER — Ambulatory Visit: Payer: Commercial Managed Care - PPO

## 2022-09-07 ENCOUNTER — Ambulatory Visit: Payer: Commercial Managed Care - PPO

## 2022-09-08 ENCOUNTER — Emergency Department: Payer: Commercial Managed Care - PPO

## 2022-09-08 ENCOUNTER — Inpatient Hospital Stay
Admission: EM | Admit: 2022-09-08 | Discharge: 2022-09-10 | DRG: 101 | Disposition: A | Discharge status: Home / Self Care | Payer: Commercial Managed Care - PPO | Attending: Internal Medicine | Admitting: Internal Medicine

## 2022-09-08 ENCOUNTER — Encounter: Payer: Self-pay | Admitting: Emergency Medicine

## 2022-09-08 DIAGNOSIS — I10 Essential (primary) hypertension: Secondary | ICD-10-CM | POA: Diagnosis present

## 2022-09-08 DIAGNOSIS — Z6830 Body mass index (BMI) 30.0-30.9, adult: Secondary | ICD-10-CM | POA: Diagnosis not present

## 2022-09-08 DIAGNOSIS — D72828 Other elevated white blood cell count: Secondary | ICD-10-CM | POA: Diagnosis not present

## 2022-09-08 DIAGNOSIS — Z91013 Allergy to seafood: Secondary | ICD-10-CM

## 2022-09-08 DIAGNOSIS — T63441A Toxic effect of venom of bees, accidental (unintentional), initial encounter: Secondary | ICD-10-CM | POA: Diagnosis present

## 2022-09-08 DIAGNOSIS — Z823 Family history of stroke: Secondary | ICD-10-CM | POA: Diagnosis not present

## 2022-09-08 DIAGNOSIS — T782XXA Anaphylactic shock, unspecified, initial encounter: Secondary | ICD-10-CM | POA: Diagnosis present

## 2022-09-08 DIAGNOSIS — Z79899 Other long term (current) drug therapy: Secondary | ICD-10-CM

## 2022-09-08 DIAGNOSIS — E669 Obesity, unspecified: Secondary | ICD-10-CM | POA: Diagnosis present

## 2022-09-08 DIAGNOSIS — Z885 Allergy status to narcotic agent status: Secondary | ICD-10-CM

## 2022-09-08 DIAGNOSIS — Z91148 Patient's other noncompliance with medication regimen for other reason: Secondary | ICD-10-CM

## 2022-09-08 DIAGNOSIS — Z8249 Family history of ischemic heart disease and other diseases of the circulatory system: Secondary | ICD-10-CM

## 2022-09-08 DIAGNOSIS — Z833 Family history of diabetes mellitus: Secondary | ICD-10-CM

## 2022-09-08 DIAGNOSIS — J029 Acute pharyngitis, unspecified: Secondary | ICD-10-CM | POA: Diagnosis present

## 2022-09-08 DIAGNOSIS — Y929 Unspecified place or not applicable: Secondary | ICD-10-CM | POA: Diagnosis not present

## 2022-09-08 DIAGNOSIS — G40909 Epilepsy, unspecified, not intractable, without status epilepticus: Principal | ICD-10-CM | POA: Diagnosis present

## 2022-09-08 DIAGNOSIS — I69359 Hemiplegia and hemiparesis following cerebral infarction affecting unspecified side: Secondary | ICD-10-CM

## 2022-09-08 DIAGNOSIS — G40901 Epilepsy, unspecified, not intractable, with status epilepticus: Secondary | ICD-10-CM | POA: Diagnosis not present

## 2022-09-08 DIAGNOSIS — I671 Cerebral aneurysm, nonruptured: Secondary | ICD-10-CM | POA: Diagnosis present

## 2022-09-08 DIAGNOSIS — F17210 Nicotine dependence, cigarettes, uncomplicated: Secondary | ICD-10-CM | POA: Diagnosis present

## 2022-09-08 DIAGNOSIS — R569 Unspecified convulsions: Secondary | ICD-10-CM

## 2022-09-08 DIAGNOSIS — E876 Hypokalemia: Secondary | ICD-10-CM | POA: Diagnosis present

## 2022-09-08 DIAGNOSIS — I69354 Hemiplegia and hemiparesis following cerebral infarction affecting left non-dominant side: Secondary | ICD-10-CM | POA: Diagnosis not present

## 2022-09-08 DIAGNOSIS — Z880 Allergy status to penicillin: Secondary | ICD-10-CM

## 2022-09-08 DIAGNOSIS — T426X6A Underdosing of other antiepileptic and sedative-hypnotic drugs, initial encounter: Secondary | ICD-10-CM | POA: Diagnosis present

## 2022-09-08 DIAGNOSIS — Z888 Allergy status to other drugs, medicaments and biological substances status: Secondary | ICD-10-CM

## 2022-09-08 DIAGNOSIS — R519 Headache, unspecified: Secondary | ICD-10-CM | POA: Diagnosis present

## 2022-09-08 DIAGNOSIS — E1169 Type 2 diabetes mellitus with other specified complication: Secondary | ICD-10-CM | POA: Diagnosis not present

## 2022-09-08 DIAGNOSIS — E119 Type 2 diabetes mellitus without complications: Secondary | ICD-10-CM | POA: Diagnosis present

## 2022-09-08 HISTORY — DX: Acute respiratory failure, unspecified whether with hypoxia or hypercapnia: J96.00

## 2022-09-08 LAB — CBG MONITORING, ED: Glucose-Capillary: 152 mg/dL — ABNORMAL HIGH (ref 70–99)

## 2022-09-08 LAB — COMPREHENSIVE METABOLIC PANEL
ALT: 23 U/L (ref 0–44)
AST: 26 U/L (ref 15–41)
Albumin: 4 g/dL (ref 3.5–5.0)
Alkaline Phosphatase: 82 U/L (ref 38–126)
Anion gap: 10 (ref 5–15)
BUN: 14 mg/dL (ref 6–20)
CO2: 24 mmol/L (ref 22–32)
Calcium: 9.4 mg/dL (ref 8.9–10.3)
Chloride: 109 mmol/L (ref 98–111)
Creatinine, Ser: 0.78 mg/dL (ref 0.44–1.00)
GFR, Estimated: >60 mL/min (ref >60)
Glucose, Bld: 149 mg/dL — ABNORMAL HIGH (ref 70–99)
Potassium: 3.1 mmol/L — ABNORMAL LOW (ref 3.5–5.1)
Sodium: 143 mmol/L (ref 135–145)
Total Bilirubin: 0.6 mg/dL (ref 0.3–1.2)
Total Protein: 7.6 g/dL (ref 6.5–8.1)

## 2022-09-08 LAB — CBC WITH DIFFERENTIAL/PLATELET
Abs Immature Granulocytes: 0.08 10*3/uL — ABNORMAL HIGH (ref 0.00–0.07)
Basophils Absolute: 0.1 10*3/uL (ref 0.0–0.1)
Basophils Relative: 0 %
Eosinophils Absolute: 0.3 10*3/uL (ref 0.0–0.5)
Eosinophils Relative: 2 %
HCT: 40 % (ref 36.0–46.0)
Hemoglobin: 12.6 g/dL (ref 12.0–15.0)
Immature Granulocytes: 0 %
Lymphocytes Relative: 43 %
Lymphs Abs: 8.7 10*3/uL — ABNORMAL HIGH (ref 0.7–4.0)
MCH: 25.5 pg — ABNORMAL LOW (ref 26.0–34.0)
MCHC: 31.5 g/dL (ref 30.0–36.0)
MCV: 80.8 fL (ref 80.0–100.0)
Monocytes Absolute: 1 10*3/uL (ref 0.1–1.0)
Monocytes Relative: 5 %
Neutro Abs: 10.2 10*3/uL — ABNORMAL HIGH (ref 1.7–7.7)
Neutrophils Relative %: 50 %
Platelets: 374 10*3/uL (ref 150–400)
RBC: 4.95 MIL/uL (ref 3.87–5.11)
RDW: 14.7 % (ref 11.5–15.5)
Smear Review: NORMAL
WBC Morphology: ABNORMAL
WBC: 20.3 10*3/uL — ABNORMAL HIGH (ref 4.0–10.5)
nRBC: 0 % (ref 0.0–0.2)

## 2022-09-08 LAB — URINALYSIS, COMPLETE (UACMP) WITH MICROSCOPIC
Bilirubin Urine: NEGATIVE
Glucose, UA: NEGATIVE mg/dL
Ketones, ur: NEGATIVE mg/dL
Leukocytes,Ua: NEGATIVE
Nitrite: NEGATIVE
Protein, ur: NEGATIVE mg/dL
Specific Gravity, Urine: 1.006 (ref 1.005–1.030)
pH: 6 (ref 5.0–8.0)

## 2022-09-08 LAB — PROCALCITONIN: Procalcitonin: 0.21 ng/mL

## 2022-09-08 LAB — BLOOD GAS, ARTERIAL
Acid-Base Excess: 0.5 mmol/L (ref 0.0–2.0)
Bicarbonate: 24.6 mmol/L (ref 20.0–28.0)
O2 Saturation: 99.8 %
Patient temperature: 37
pCO2 arterial: 37 mmHg (ref 32–48)
pH, Arterial: 7.43 (ref 7.35–7.45)
pO2, Arterial: 81 mmHg — ABNORMAL LOW (ref 83–108)

## 2022-09-08 LAB — SEDIMENTATION RATE: Sed Rate: 28 mm/hr (ref 0–30)

## 2022-09-08 LAB — TROPONIN I (HIGH SENSITIVITY)
Troponin I (High Sensitivity): 11 ng/L (ref <18)
Troponin I (High Sensitivity): 9 ng/L (ref <18)

## 2022-09-08 LAB — CK: Total CK: 128 U/L (ref 38–234)

## 2022-09-08 LAB — D-DIMER, QUANTITATIVE: D-Dimer, Quant: 0.7 ug/mL-FEU — ABNORMAL HIGH (ref 0.00–0.50)

## 2022-09-08 LAB — APTT: aPTT: 23 seconds — ABNORMAL LOW (ref 24–36)

## 2022-09-08 LAB — ETHANOL: Alcohol, Ethyl (B): <10 mg/dL (ref <10)

## 2022-09-08 LAB — LACTIC ACID, PLASMA
Lactic Acid, Venous: 2.1 mmol/L (ref 0.5–1.9)
Lactic Acid, Venous: 1.7 mmol/L (ref 0.5–1.9)

## 2022-09-08 MED ORDER — LEVETIRACETAM IN NACL 1000 MG/100ML IV SOLN
INTRAVENOUS | Status: AC
  Administered 2022-09-08: 1000 mg via INTRAVENOUS
  Filled 2022-09-08: qty 100

## 2022-09-08 MED ORDER — ZONISAMIDE 100 MG PO CAPS: 200.0000 mg | ORAL_CAPSULE | Freq: Every day | ORAL | Status: DC

## 2022-09-08 MED ORDER — SODIUM CHLORIDE 0.9 % IV SOLN
2000.0000 mg | Freq: Once | INTRAVENOUS | Status: AC
  Administered 2022-09-08: 2000 mg via INTRAVENOUS
  Filled 2022-09-08: qty 20

## 2022-09-08 MED ORDER — SODIUM CHLORIDE 0.9 % IV SOLN
2000.0000 mg | Freq: Once | INTRAVENOUS | Status: DC
  Administered 2022-09-08: 1000 mg via INTRAVENOUS

## 2022-09-08 MED ORDER — IOHEXOL 350 MG/ML SOLN
75.0000 mL | Freq: Once | INTRAVENOUS | Status: AC | PRN
  Administered 2022-09-08: 75 mL via INTRAVENOUS

## 2022-09-08 MED ORDER — ZONISAMIDE 100 MG PO CAPS: 100.0000 mg | ORAL_CAPSULE | Freq: Every day | ORAL | Status: DC

## 2022-09-08 MED ORDER — LORAZEPAM 2 MG/ML IJ SOLN
4.0000 mg | Freq: Once | INTRAMUSCULAR | Status: AC
  Administered 2022-09-08: 2 mg via INTRAVENOUS
  Filled 2022-09-08: qty 2

## 2022-09-08 MED ORDER — HEPARIN SODIUM (PORCINE) 5000 UNIT/ML IJ SOLN
5000.0000 [IU] | Freq: Three times a day (TID) | INTRAMUSCULAR | Status: DC
  Administered 2022-09-09 – 2022-09-10 (×5): 5000 [IU] via SUBCUTANEOUS
  Filled 2022-09-08 (×5): qty 1

## 2022-09-08 MED ORDER — SODIUM CHLORIDE 0.9 % IV BOLUS
1000.0000 mL | Freq: Once | INTRAVENOUS | Status: AC
  Administered 2022-09-08: 1000 mL via INTRAVENOUS

## 2022-09-08 MED ORDER — FAMOTIDINE IN NACL 20-0.9 MG/50ML-% IV SOLN
20.0000 mg | Freq: Once | INTRAVENOUS | Status: AC
  Administered 2022-09-08: 20 mg via INTRAVENOUS
  Filled 2022-09-08: qty 50

## 2022-09-08 MED ORDER — LEVETIRACETAM IN NACL 1000 MG/100ML IV SOLN
1000.0000 mg | Freq: Once | INTRAVENOUS | Status: DC
  Filled 2022-09-08: qty 100

## 2022-09-08 MED ORDER — LORAZEPAM 2 MG/ML IJ SOLN
INTRAMUSCULAR | Status: AC
  Administered 2022-09-08: 4 mg via INTRAVENOUS
  Filled 2022-09-08: qty 1

## 2022-09-08 MED ORDER — METHYLPREDNISOLONE SODIUM SUCC 125 MG IJ SOLR
125.0000 mg | Freq: Once | INTRAMUSCULAR | Status: AC
  Administered 2022-09-08: 125 mg via INTRAVENOUS
  Filled 2022-09-08: qty 2

## 2022-09-08 MED ORDER — LEVETIRACETAM 500 MG PO TABS
1500.0000 mg | ORAL_TABLET | Freq: Two times a day (BID) | ORAL | Status: DC
  Administered 2022-09-09: 1500 mg via ORAL
  Filled 2022-09-08: qty 3

## 2022-09-08 MED ORDER — EPINEPHRINE 0.3 MG/0.3ML IJ SOAJ
0.3000 mg | Freq: Once | INTRAMUSCULAR | Status: AC
  Administered 2022-09-08: 0.3 mg via INTRAMUSCULAR
  Filled 2022-09-08: qty 0.3

## 2022-09-08 MED ORDER — VANCOMYCIN HCL 750 MG/150ML IV SOLN
750.0000 mg | Freq: Two times a day (BID) | INTRAVENOUS | Status: DC
  Administered 2022-09-09: 750 mg via INTRAVENOUS
  Filled 2022-09-08 (×3): qty 150

## 2022-09-08 MED ORDER — LORAZEPAM 2 MG/ML IJ SOLN: 4.0000 mg | Freq: Once | INTRAMUSCULAR | Status: AC

## 2022-09-08 MED ORDER — LEVETIRACETAM IN NACL 1000 MG/100ML IV SOLN: 1000.0000 mg | Freq: Once | INTRAVENOUS | Status: AC

## 2022-09-08 MED ORDER — SODIUM CHLORIDE 0.9 % IV SOLN
2.0000 g | Freq: Three times a day (TID) | INTRAVENOUS | Status: DC
  Administered 2022-09-09 (×3): 2 g via INTRAVENOUS
  Filled 2022-09-08 (×3): qty 10

## 2022-09-08 MED ORDER — VANCOMYCIN HCL 2000 MG/400ML IV SOLN
2000.0000 mg | Freq: Once | INTRAVENOUS | Status: AC
  Administered 2022-09-08: 2000 mg via INTRAVENOUS
  Filled 2022-09-08: qty 400

## 2022-09-08 NOTE — ED Notes (Signed)
Pt continued to have seizures.  EDP at bedside.  Decision to move patient to Rm 14 for closer monitoring.

## 2022-09-08 NOTE — ED Provider Notes (Addendum)
8:04 PM Assumed care for off going team.   Blood pressure (!) 123/57, pulse 88, temperature 98 F (36.7 C), temperature source Oral, resp. rate (!) 23, SpO2 100 %.  See their HPI for full report but in brief moved to main for seizure  .Critical Care  Performed by: Concha Se, MD Authorized by: Concha Se, MD   Critical care provider statement:    Critical care time (minutes):  30   Critical care was necessary to treat or prevent imminent or life-threatening deterioration of the following conditions:  CNS failure or compromise   Critical care was time spent personally by me on the following activities:  Development of treatment plan with patient or surrogate, discussions with consultants, evaluation of patient's response to treatment, examination of patient, ordering and review of laboratory studies, ordering and review of radiographic studies, ordering and performing treatments and interventions, pulse oximetry, re-evaluation of patient's condition and review of old charts   According to staff at witnessed that she did not hit her head but CT head was ordered and did not have any evidence intracranial hemorrhage.  Patient was loaded with 4 g of Keppra.  I had discussed with Dr. Otelia Limes who initially wanted her to be on her sonogram medication through NG tube but after further discussion with pharmacy patient has not been taking this and has not had it filled since May so he recommended that we hold off on this for now and just to the Keppra.  On my reassessment patient is still waking up opening eyes pupils are midline.  According to family she did state some words.  After Dr. Otelia Limes and evaluated the patient and felt the patient could be admitted to the hospitalist team here therefore I discussed the hospital team who is admitted patient.  White count was elevated but patient has no fever has known history of seizures without being compliant with medications and was just treated with  steroids and 2 doses of epi for anaphylaxis so I suspect that this is more likely a stress response.    Concha Se, MD 09/08/22 2005    Concha Se, MD 09/08/22 2005

## 2022-09-08 NOTE — ED Notes (Signed)
Attempted to draw blood cultures x 2 attempts unsuccessfully. Brandon RN at bedside at this time.

## 2022-09-08 NOTE — ED Provider Notes (Signed)
[facility] Provider Note  Patient Contact: 3:39 PM (approximate)   History   Allergic Reaction   HPI  [name] is a 51 y.o. female with a history of seizures, headache, prior brain aneurysm and hypertension, presents to the emergency department with vomiting, throat tightness and pharyngitis after being stung by an insect.  Patient was given IM epinephrine and 50 of Benadryl in route.  She states that her throat tightness did not improve after aforementioned medications were administered.  She is currently experiencing some chest tightness and nausea.      Physical Exam   Triage Vital Signs: ED Triage Vitals  Enc Vitals Group     BP 09/08/22 1529 (!) 145/74     Pulse Rate 09/08/22 1529 98     Resp 09/08/22 1529 19     Temp 09/08/22 1529 98 F (36.7 C)     Temp Source 09/08/22 1529 Oral     SpO2 09/08/22 1529 98 %     Weight --      Height --      Head Circumference --      Peak Flow --      Pain Score 09/08/22 1526 0     Pain Loc --      Pain Edu? --      Excl. in GC? --     Most recent vital signs: Vitals:   09/08/22 1800 09/08/22 1900  BP: 118/63 (!) 123/57  Pulse: 95 88  Resp: (!) 21 (!) 23  Temp:    SpO2: 100% 100%     General: Alert and in no acute distress. Eyes:  PERRL. EOMI. Head: No acute traumatic findings ENT:      Nose: No congestion/rhinnorhea.      Mouth/Throat: Mucous membranes are moist. Neck: No stridor. No cervical spine tenderness to palpation. Cardiovascular:  Good peripheral perfusion Respiratory: Normal respiratory effort without tachypnea or retractions. Lungs CTAB. Good air entry to the bases with no decreased or absent breath sounds. Gastrointestinal: Bowel sounds 4 quadrants. Soft and nontender to palpation. No guarding or rigidity. No palpable masses. No distention. No CVA tenderness. Musculoskeletal: Full range of motion to all extremities.  Neurologic:  No gross focal neurologic deficits are  appreciated.  Skin:   No rash noted Other:   ED Results / Procedures / Treatments   Labs (all labs ordered are listed, but only abnormal results are displayed) Labs Reviewed  CBC WITH DIFFERENTIAL/PLATELET - Abnormal; Notable for the following components:      Result Value   WBC 20.3 (*)    MCH 25.5 (*)    Neutro Abs 10.2 (*)    Lymphs Abs 8.7 (*)    Abs Immature Granulocytes 0.08 (*)    All other components within normal limits  COMPREHENSIVE METABOLIC PANEL - Abnormal; Notable for the following components:   Potassium 3.1 (*)    Glucose, Bld 149 (*)    All other components within normal limits  BLOOD GAS, ARTERIAL - Abnormal; Notable for the following components:   pO2, Arterial 81 (*)    All other components within normal limits  D-DIMER, QUANTITATIVE (NOT AT [facility]) - Abnormal; Notable for the following components:   D-Dimer, Quant 0.70 (*)    All other components within normal limits  APTT - Abnormal; Notable for the following components:   aPTT 23 (*)    All other components within normal limits  LACTIC ACID, PLASMA - Abnormal; Notable for the following  components:   Lactic Acid, Venous 2.1 (*)    All other components within normal limits  CBG MONITORING, ED - Abnormal; Notable for the following components:   Glucose-Capillary 152 (*)    All other components within normal limits  CULTURE, BLOOD (ROUTINE X 2)  CULTURE, BLOOD (ROUTINE X 2)  URINE CULTURE  CK  SEDIMENTATION RATE  ETHANOL  PATHOLOGIST SMEAR REVIEW  HEMOGLOBIN A1C  PROCALCITONIN  C-REACTIVE PROTEIN  VITAMIN B12  LACTIC ACID, PLASMA  LYME DISEASE SEROLOGY W/REFLEX  RPR  ANA W/REFLEX  URINALYSIS, COMPLETE (UACMP) WITH MICROSCOPIC  CBC WITH DIFFERENTIAL/PLATELET  COMPREHENSIVE METABOLIC PANEL  MAGNESIUM  PHOSPHORUS  TROPONIN I (HIGH SENSITIVITY)  TROPONIN I (HIGH SENSITIVITY)         PROCEDURES:  Critical Care performed: No  Procedures   MEDICATIONS ORDERED IN ED: Medications   levETIRAcetam (KEPPRA) IVPB 1000 mg/100 mL premix (0 mg Intravenous Stopped 09/08/22 1630)    Followed by  levETIRAcetam (KEPPRA) IVPB 1000 mg/100 mL premix (1,000 mg Intravenous Not Given 09/08/22 1703)  iohexol (OMNIPAQUE) 350 MG/ML injection 75 mL (has no administration in time range)  heparin injection 5,000 Units (has no administration in time range)  sodium chloride 0.9 % bolus 1,000 mL (0 mLs Intravenous Stopped 09/08/22 1910)  EPINEPHrine (EPI-PEN) injection 0.3 mg (0.3 mg Intramuscular Given 09/08/22 1555)  famotidine (PEPCID) IVPB 20 mg premix (0 mg Intravenous Stopped 09/08/22 1628)  methylPREDNISolone sodium succinate (SOLU-MEDROL) 125 mg/2 mL injection 125 mg (125 mg Intravenous Given 09/08/22 1556)  LORazepam (ATIVAN) injection 4 mg (2 mg Intravenous Given 09/08/22 1622)  LORazepam (ATIVAN) injection 4 mg (4 mg Intravenous Given 09/08/22 1609)  levETIRAcetam (KEPPRA) 2,000 mg in sodium chloride 0.9 % 250 mL IVPB (0 mg Intravenous Stopped 09/08/22 1714)     IMPRESSION / MDM / ASSESSMENT AND PLAN / ED COURSE  I reviewed the triage vital signs and the nursing notes.                              Assessment and plan: Anaphylaxis 51 year old female presents to the emergency department after being stung at work.  Patient developed throat tightness, pharyngitis, chest tightness, nausea and vomiting after being stung.  Patient received 50 mg of Benadryl in route as well as IM epinephrine with little improvement in her symptoms.  Patient was given epinephrine, Pepcid, Solu-Medrol and a normal saline bolus while in the emergency department.    Patient was taken to the restroom by her daughter and while in the restroom, patient had a seizure.  Patient was given 6 mg of Ativan over the course of approximately 30 minutes as well as 2000 mg of Keppra.  Patient's daughter reports that patient might have missed doses of her seizure medications.  Patient was transitioned to main side of  the emergency department for further care and management.  Attending, Cheron Schaumann assumed patient care.   FINAL CLINICAL IMPRESSION(S) / ED DIAGNOSES   Final diagnoses:  Seizure-like activity (HCC)     Rx / DC Orders   ED Discharge Orders     None        Note:  This document was prepared using Dragon voice recognition software and may include unintentional dictation errors.   Pia Mau Rockledge, PA-C 09/08/22 2113    Georga Hacking, MD 09/08/22 [phone]

## 2022-09-08 NOTE — Consult Note (Signed)
NEURO HOSPITALIST CONSULT NOTE   Requestig physician: Dr. Fuller PlanFunke  Reason for Consult: Seizure recurrence due to anticonvulsant noncompliance  History obtained from:  Family, EDP and Chart     HPI:                                                                                                                                          [name] is an 51 y.o. female with a PMHx of epilepsy (on Keppra 1500 mg BID and Zonegran 100 mg QAM/200 mg QHS  at home), brain aneurysm, hemiplegic migraine, prior stroke with residual left hemiparesis and HTN who presents to the ED after having had a suspected allergic reaction at work due to having been "bitten on the neck". She had been given an epi pen and Benadryl 50 mg by staff at her workplace prior to EMS arrival. After arrival, she started actively seizing while on the toilet. She was given 2 mg IV Ativan with resolution of seizure activity and brought back to her room via stretcher. She did not fall or otherwise injure herself due to the seizure. Keppra 2000 mg IV load was then ordered. About halfway through administration of the loading dose of Keppra, she had onset of left face and LUE repetitive jerking movements which progressed to another GTC seizure. Additional IV Ativan 2 mg x 2 was administered with cessation of seizure activity. Another 2000 mg load of Keppra has been ordered for a total of 4000 mg.   Of note, daughter states that the patient is often noncompliant with her seizure medications and was again noncompliant for a few days prior to this admission.   Past Medical History:  Diagnosis Date   Brain aneurysm    Common migraine with intractable migraine 03/03/2017   Headache    Hemiplegic migraine 10/07/2015   Late effects of cerebral ischemic stroke 02/09/2018   Left hemiparesis   Primary hypertension 12/06/2020   Seizures (HCC)     Past Surgical History:  Procedure Laterality Date   ABDOMINAL HYSTERECTOMY      COLONOSCOPY WITH PROPOFOL N/A 09/01/2022   Procedure: COLONOSCOPY WITH PROPOFOL;  Surgeon: Wyline MoodAnna, Kiran, MD;  [facility];  Service: Gastroenterology;  Laterality: N/A;    Family History  Problem Relation Age of Onset   Stroke Mother    Hypertension Father    Diabetes Father    Migraines Sister    Migraines Brother    Migraines Sister    Migraines Sister    Migraines Sister    Migraines Sister    Migraines Sister    Hypertension Paternal Aunt    Diabetes Paternal Aunt    Stroke Paternal Aunt    Hypertension Paternal Uncle    Diabetes Paternal Uncle    Stroke Paternal  Uncle    Hypertension Maternal Grandmother    Aneurysm Paternal Grandmother    Diabetes Paternal Grandmother    Hypertension Paternal Grandfather    Cancer Paternal Grandfather    Seizures Cousin             Social History: Currently smokes 0.5 ppd. Occasional alcohol which family states is not at high intake.   Allergies  Allergen Reactions   Amoxicillin Anaphylaxis   Fish Allergy Anaphylaxis   Morphine And Related Anaphylaxis   Penicillins Anaphylaxis    Has patient had a PCN reaction causing immediate rash, facial/tongue/throat swelling, SOB or lightheadedness with hypotension: Yes Has patient had a PCN reaction causing severe rash involving mucus membranes or skin necrosis: Yes Has patient had a PCN reaction that required hospitalization Yes Has patient had a PCN reaction occurring within the last 10 years: Yes If all of the above answers are "NO", then may proceed with Cephalosporin use.    Rocephin [Ceftriaxone Sodium In Dextrose] Anaphylaxis   Shellfish Allergy Anaphylaxis and Itching   Tylenol [Acetaminophen] Anaphylaxis   Topamax [Topiramate] Nausea And Vomiting   Tramadol Other (See Comments)    Unknown per SO    HOME MEDICATIONS:                                                                                                                      No current facility-administered  medications on file prior to encounter.   Current Outpatient Medications on File Prior to Encounter  Medication Sig Dispense Refill   albuterol (VENTOLIN HFA) 108 (90 Base) MCG/ACT inhaler Inhale 2 puffs into the lungs every 6 (six) hours as needed for wheezing or shortness of breath. 3 each 3   ibuprofen (ADVIL) 200 MG tablet Take 1 tablet (200 mg total) by mouth as needed (pain). 90 tablet 3   levETIRAcetam (KEPPRA) 750 MG tablet Take 2 tablets (1,500 mg total) by mouth 2 (two) times daily. 120 tablet 3   lisinopril (ZESTRIL) 10 MG tablet Take 10 mg by mouth daily.     zonisamide (ZONEGRAN) 100 MG capsule One capsule in the morning and 2 at night 270 capsule 3     ROS:                                                                                                                                       Unable to obtain due  to sedation.    Blood pressure (!) 145/74, pulse 98, temperature 98 F (36.7 C), temperature source Oral, resp. rate 19, SpO2 98 %.   General Examination:                                                                                                       Physical Exam  HEENT-  [place]/AT. No nuchal rigidity.   Lungs- Respirations unlabored in the context of occasional snoring.  Extremities- No edema  Neurological Examination Mental Status: Sedated and currently sleeping with intermittent snoroing. Opens eyes about 4 mm after her name is called out to her loudly, then goes back to sleep. Will localize purposefully to sternal rub but eyes remain closed. Does not follow any commands. No verbal output to noxious.  Cranial Nerves: II: Pupils are round, initially at 6 mm, then slowly contract to 3 mm bilaterally with penlight. Subtle blink to threat when eyelids held open by examiner during this maneuver.   III,IV, VI: Eyes are conjugate near the midline without nystagmus or jerking. Does not track.  V,VII: Reacts to tactile stimulation. Face is symmetric at rest.  VIII:  Briefly opens eyes partially to loud calling of her name IX,X: Gag reflex deferred XI: Head is midline XII: Unable to assess Motor/Sensory: Moves BUE with symmetric strength to sternal rub and arm pinch.  Withdraws BLE to noxious plantar stimulation.  No asymmetry with the above.  No posturing, jerking or twitching noted.  Deep Tendon Reflexes: 1+ and symmetric bilateral brachioradialis and patellae. Negative Hoffman's. Toes downgoing.  Cerebellar/Gait: Unable to assess    Lab Results: Basic Metabolic Panel: Recent Labs  Lab 09/08/22 1539  NA 143  K 3.1*  CL 109  CO2 24  GLUCOSE 149*  BUN 14  CREATININE 0.78  CALCIUM 9.4    CBC: Recent Labs  Lab 09/08/22 1539  WBC 20.3*  NEUTROABS 10.2*  HGB 12.6  HCT 40.0  MCV 80.8  PLT 374    Cardiac Enzymes: No results for input(s): "CKTOTAL", "CKMB", "CKMBINDEX", "TROPONINI" in the last 168 hours.  Lipid Panel: No results for input(s): "CHOL", "TRIG", "HDL", "CHOLHDL", "VLDL", "LDLCALC" in the last 168 hours.  Imaging: No results found.   Assessment: 51 year old female with a history of epilepsy, presenting with seizure recurrence due to anticonvulsant noncompliance - Exam reveals a sedated/postictal patient without evidence for ongoing seizure activity. No motor asymmetry noted.  - CT head: Normal - Na and Ca are normal. LFTs normal. Mildly hypokalemic (3.1). BUN and Cr normal. WBC elevated at 20.3.   Recommendations: - Continue the patient on Zonegran per tube and her home dosing regimen of Keppra administered IV for now.  - Inpatient seizure precautions - When awake, will need to be counseled regarding outpatient seizure precautions: Per [facility] statutes, patients with seizures are not allowed to drive until  they have been seizure-free for six months. Use caution when using heavy equipment or power tools. Avoid working on ladders or at heights. Take showers instead of baths. Ensure the water temperature is  not  too high on the home water heater. Do not go swimming alone. When caring for infants or small children, sit down when holding, feeding, or changing them to minimize risk of injury to the child in the event you have a seizure. Also, Maintain good sleep hygiene. Avoid alcohol. - EEG in the AM  Addendum:  - Pharmacy notes that the patient has not refilled her Zonegran for several months, therefore her presentation is most likely due to withdrawal from what was effectively Keppra monotherapy - Continue Keppra at 1500 mg BID - Discontinue Zonegran    Electronically signed: Dr. Caryl Pina 09/08/2022, 4:45 PM

## 2022-09-08 NOTE — H&P (Signed)
History and Physical    Chief Complaint: Seizures.    HISTORY OF PRESENT ILLNESS: [name] is an 51 y.o. female seen today for seizures at work. Per daughter at bedside pt was bitten at work by some insect and then developed sob and has seizures she was given epi pen at work by [employer] and again at ed.  Pt has been somnolent and nonverbal lethargic.  Pt has her most recent seizure in September after not having seizure since long time.     Pt has PMH as below: Past Medical History:  Diagnosis Date   Acute respiratory failure (HCC)    Brain aneurysm    Common migraine with intractable migraine 03/03/2017   Headache    Hemiplegic migraine 10/07/2015   Late effects of cerebral ischemic stroke 02/09/2018   Left hemiparesis   Primary hypertension 12/06/2020   Seizures (HCC)      Review of Systems  Unable to perform ROS: Mental status change      Allergies  Allergen Reactions   Amoxicillin Anaphylaxis   Fish Allergy Anaphylaxis   Morphine And Related Anaphylaxis   Penicillins Anaphylaxis    Has patient had a PCN reaction causing immediate rash, facial/tongue/throat swelling, SOB or lightheadedness with hypotension: Yes Has patient had a PCN reaction causing severe rash involving mucus membranes or skin necrosis: Yes Has patient had a PCN reaction that required hospitalization Yes Has patient had a PCN reaction occurring within the last 10 years: Yes If all of the above answers are "NO", then may proceed with Cephalosporin use.    Rocephin [Ceftriaxone Sodium In Dextrose] Anaphylaxis   Shellfish Allergy Anaphylaxis and Itching   Tylenol [Acetaminophen] Anaphylaxis   Topamax [Topiramate] Nausea And Vomiting   Tramadol Other (See Comments)    Unknown per SO     Past Surgical History:  Procedure Laterality Date   ABDOMINAL HYSTERECTOMY     COLONOSCOPY WITH PROPOFOL N/A 09/01/2022   Procedure: COLONOSCOPY WITH PROPOFOL;  Surgeon: Wyline Mood, MD;  Location: [facility]  ENDOSCOPY;  Service: Gastroenterology;  Laterality: N/A;      Social History   Socioeconomic History   Marital status: Married    Spouse name: Not on file   Number of children: 3   Years of education: 11   Highest education level: Not on file  Occupational History   Occupation: Designer, television/film set  Tobacco Use   Smoking status: Every Day    Packs/day: 0.50    Types: Cigarettes    Last attempt to quit: 05/2022    Years since quitting: 0.2   Smokeless tobacco: Never  Substance and Sexual Activity   Alcohol use: Yes    Alcohol/week: 0.0 standard drinks of alcohol    Comment: occasionally   Drug use: No   Sexual activity: Not on file  Other Topics Concern   Not on file  Social History Narrative   Patient drink about 2 cups of caffeine daily.   Patient is right handed.    Social Determinants of Health   Financial Resource Strain: Not on file  Food Insecurity: Not on file  Transportation Needs: Not on file  Physical Activity: Not on file  Stress: Not on file  Social Connections: Not on file      CURRENT MEDS:     Current Outpatient Medications (Cardiovascular):    lisinopril (ZESTRIL) 10 MG tablet, Take 10 mg by mouth daily. (Patient not taking: Reported on 09/08/2022)   Current Outpatient Medications (Respiratory):    albuterol (  VENTOLIN HFA) 108 (90 Base) MCG/ACT inhaler, Inhale 2 puffs into the lungs every 6 (six) hours as needed for wheezing or shortness of breath.   Current Outpatient Medications (Analgesics):    ibuprofen (ADVIL) 200 MG tablet, Take 1 tablet (200 mg total) by mouth as needed (pain).  Current Facility-Administered Medications (Hematological):    heparin injection 5,000 Units   Current Facility-Administered Medications (Other):    aztreonam (AZACTAM) 2 g in sodium chloride 0.9 % 100 mL IVPB   [COMPLETED] levETIRAcetam (KEPPRA) IVPB 1000 mg/100 mL premix **IN FOLLOWED-BY LINKED GROUP WITH** levETIRAcetam (KEPPRA) IVPB 1000 mg/100 mL  premix   levETIRAcetam (KEPPRA) tablet 1,500 mg   vancomycin (VANCOREADY) IVPB 750 mg/150 mL  Current Outpatient Medications (Other):    levETIRAcetam (KEPPRA) 750 MG tablet, Take 2 tablets (1,500 mg total) by mouth 2 (two) times daily.   zonisamide (ZONEGRAN) 100 MG capsule, One capsule in the morning and 2 at night (Patient not taking: Reported on 09/08/2022)    ED Course: Pt in Ed pt is Lethargic and nonverbal.  Vitals:   09/08/22 1800 09/08/22 1900 09/08/22 2157 09/09/22 0100  BP: 118/63 (!) 123/57  119/77  Pulse: 95 88  84  Resp: (!) 21 (!) 23  18  Temp:   98.3 F (36.8 C)   TempSrc:   Axillary   SpO2: 100% 100%  95%   Total I/O In: 1500 [IV Piggyback:1500] Out: -  SpO2: 95 % Blood work in ed shows  Results for orders placed or performed during the hospital encounter of 09/08/22 (from the past 48 hour(s))  CBC with Differential     Status: Abnormal   Collection Time: 09/08/22  3:39 PM  Result Value Ref Range   WBC 20.3 (H) 4.0 - 10.5 K/uL   RBC 4.95 3.87 - 5.11 MIL/uL   Hemoglobin 12.6 12.0 - 15.0 g/dL   HCT 45.440.0 09.836.0 - 11.946.0 %   MCV 80.8 80.0 - 100.0 fL   MCH 25.5 (L) 26.0 - 34.0 pg   MCHC 31.5 30.0 - 36.0 g/dL   RDW 14.714.7 82.911.5 - 56.215.5 %   Platelets 374 150 - 400 K/uL   nRBC 0.0 0.0 - 0.2 %   Neutrophils Relative % 50 %   Neutro Abs 10.2 (H) 1.7 - 7.7 K/uL   Lymphocytes Relative 43 %   Lymphs Abs 8.7 (H) 0.7 - 4.0 K/uL   Monocytes Relative 5 %   Monocytes Absolute 1.0 0.1 - 1.0 K/uL   Eosinophils Relative 2 %   Eosinophils Absolute 0.3 0.0 - 0.5 K/uL   Basophils Relative 0 %   Basophils Absolute 0.1 0.0 - 0.1 K/uL   WBC Morphology Abnormal lymphocytes present    RBC Morphology MORPHOLOGY UNREMARKABLE    Smear Review Normal platelet morphology    Immature Granulocytes 0 %   Abs Immature Granulocytes 0.08 (H) 0.00 - 0.07 K/uL    Comment: Performed at [facility], [address] [place], [place] [address]  Comprehensive metabolic panel     Status:  Abnormal   Collection Time: 09/08/22  3:39 PM  Result Value Ref Range   Sodium 143 135 - 145 mmol/L   Potassium 3.1 (L) 3.5 - 5.1 mmol/L   Chloride 109 98 - 111 mmol/L   CO2 24 22 - 32 mmol/L   Glucose, Bld 149 (H) 70 - 99 mg/dL    Comment: Glucose reference range applies only to samples taken after fasting for at least 8 hours.   BUN  14 6 - 20 mg/dL   Creatinine, Ser 3.26 0.44 - 1.00 mg/dL   Calcium 9.4 8.9 - 71.2 mg/dL   Total Protein 7.6 6.5 - 8.1 g/dL   Albumin 4.0 3.5 - 5.0 g/dL   AST 26 15 - 41 U/L   ALT 23 0 - 44 U/L   Alkaline Phosphatase 82 38 - 126 U/L   Total Bilirubin 0.6 0.3 - 1.2 mg/dL   GFR, Estimated >45 >80 mL/min    Comment: (NOTE) Calculated using the CKD-EPI Creatinine Equation (2021)    Anion gap 10 5 - 15    Comment: Performed at [facility], [address] [place], [place] [address]  POC CBG, ED     Status: Abnormal   Collection Time: 09/08/22  4:34 PM  Result Value Ref Range   Glucose-Capillary 152 (H) 70 - 99 mg/dL    Comment: Glucose reference range applies only to samples taken after fasting for at least 8 hours.  Blood gas, arterial     Status: Abnormal   Collection Time: 09/08/22  6:55 PM  Result Value Ref Range   Delivery systems ROOM AIR    pH, Arterial 7.43 7.35 - 7.45   pCO2 arterial 37 32 - 48 mmHg   pO2, Arterial 81 (L) 83 - 108 mmHg   Bicarbonate 24.6 20.0 - 28.0 mmol/L   Acid-Base Excess 0.5 0.0 - 2.0 mmol/L   O2 Saturation 99.8 %   Patient temperature 37.0    Collection site LEFT BRACHIAL    Allens test (pass/fail) PASS PASS    Comment: Performed at [facility], [address], [place], [place] [address]  Troponin I (High Sensitivity)     Status: None   Collection Time: 09/08/22  6:55 PM  Result Value Ref Range   Troponin I (High Sensitivity) 11 <18 ng/L    Comment: (NOTE) Elevated high sensitivity troponin I (hsTnI) values and significant  changes across serial measurements may suggest ACS but many other   chronic and acute conditions are known to elevate hsTnI results.  Refer to the "Links" section for chest pain algorithms and additional  guidance. Performed at [facility], [address] [place], [place] [address]   CK     Status: None   Collection Time: 09/08/22  6:55 PM  Result Value Ref Range   Total CK 128 38 - 234 U/L    Comment: Performed at [facility], [address] [place], [place] [address]  Procalcitonin - Baseline     Status: None   Collection Time: 09/08/22  6:55 PM  Result Value Ref Range   Procalcitonin 0.21 ng/mL    Comment:        Interpretation: PCT (Procalcitonin) <= 0.5 ng/mL: Systemic infection (sepsis) is not likely. Local bacterial infection is possible. (NOTE)       Sepsis PCT Algorithm           Lower Respiratory Tract                                      Infection PCT Algorithm    ----------------------------     ----------------------------         PCT < 0.25 ng/mL                PCT < 0.10 ng/mL          Strongly encourage  Strongly discourage   discontinuation of antibiotics    initiation of antibiotics    ----------------------------     -----------------------------       PCT 0.25 - 0.50 ng/mL            PCT 0.10 - 0.25 ng/mL               OR       >80% decrease in PCT            Discourage initiation of                                            antibiotics      Encourage discontinuation           of antibiotics    ----------------------------     -----------------------------         PCT >= 0.50 ng/mL              PCT 0.26 - 0.50 ng/mL               AND        <80% decrease in PCT             Encourage initiation of                                             antibiotics       Encourage continuation           of antibiotics    ----------------------------     -----------------------------        PCT >= 0.50 ng/mL                  PCT > 0.50 ng/mL               AND         increase in PCT                   Strongly encourage                                      initiation of antibiotics    Strongly encourage escalation           of antibiotics                                     -----------------------------                                           PCT <= 0.25 ng/mL                                                 OR                                        >  80% decrease in PCT                                      Discontinue / Do not initiate                                             antibiotics  Performed at [facility], [address] [place], [place] [address]   Sedimentation rate     Status: None   Collection Time: 09/08/22  6:55 PM  Result Value Ref Range   Sed Rate 28 0 - 30 mm/hr    Comment: Performed at [facility], [address] [place], [place] [address]  Ethanol     Status: None   Collection Time: 09/08/22  6:55 PM  Result Value Ref Range   Alcohol, Ethyl (B) <10 <10 mg/dL    Comment: (NOTE) Lowest detectable limit for serum alcohol is 10 mg/dL.  For medical purposes only. Performed at [facility], [address] [place], [place] [address]   D-dimer, quantitative     Status: Abnormal   Collection Time: 09/08/22  7:05 PM  Result Value Ref Range   D-Dimer, Quant 0.70 (H) 0.00 - 0.50 ug/mL-FEU    Comment: (NOTE) At the manufacturer cut-off value of 0.5 g/mL FEU, this assay has a negative predictive value of 95-100%.This assay is intended for use in conjunction with a clinical pretest probability (PTP) assessment model to exclude pulmonary embolism (PE) and deep venous thrombosis (DVT) in outpatients suspected of PE or DVT. Results should be correlated with clinical presentation. Performed at [facility], [address] [place], [place] [address]   APTT     Status: Abnormal   Collection Time: 09/08/22  7:05 PM  Result Value Ref Range   aPTT 23 (L) 24 - 36 seconds    Comment: Performed at [facility], [address] [place], [place] [address]  Lactic acid, plasma     Status: Abnormal   Collection Time: 09/08/22  7:06 PM  Result Value Ref Range   Lactic Acid, Venous 2.1 (HH) 0.5 - 1.9 mmol/L    Comment: CRITICAL RESULT CALLED TO, READ BACK BY AND VERIFIED WITH HENRY AUSTIN 09/08/22 2040 MU Performed at [facility] [address] [place], [place] [address]   Lactic acid, plasma     Status: None   Collection Time: 09/08/22  9:48 PM  Result Value Ref Range   Lactic Acid, Venous 1.7 0.5 - 1.9 mmol/L    Comment: Performed at [facility], [address] [place], [place] [address]  Troponin I (High Sensitivity)     Status: None   Collection Time: 09/08/22  9:48 PM  Result Value Ref Range   Troponin I (High Sensitivity) 9 <18 ng/L    Comment: (NOTE) Elevated high sensitivity troponin I (hsTnI) values and significant  changes across serial measurements may suggest ACS but many other  chronic and acute conditions are known to elevate hsTnI results.  Refer to the "Links" section for chest pain algorithms and additional  guidance. Performed at [place] [facility], [address] [place], [place] [address]   Urinalysis, Complete w Microscopic Urine, Clean Catch     Status: Abnormal   Collection Time: 09/08/22  9:56 PM  Result Value Ref Range   Color, Urine STRAW (A) YELLOW   APPearance CLEAR (A) CLEAR   Specific Gravity, Urine 1.006 1.005 - 1.030   pH 6.0 5.0 - 8.0   Glucose, UA NEGATIVE NEGATIVE mg/dL   Hgb urine dipstick LARGE (A) NEGATIVE   Bilirubin Urine NEGATIVE NEGATIVE   Ketones, ur NEGATIVE NEGATIVE mg/dL   Protein, ur NEGATIVE NEGATIVE mg/dL   Nitrite NEGATIVE NEGATIVE   Leukocytes,Ua NEGATIVE NEGATIVE   RBC / HPF 11-20 0 - 5 RBC/hpf   WBC, UA 0-5 0 - 5 WBC/hpf   Bacteria, UA RARE (A) NONE SEEN   Squamous Epithelial / LPF 0-5 0 - 5   Mucus PRESENT     Comment: Performed at [facility], [address] [place], [place]  [address]    In Ed pt received  Meds ordered this encounter  Medications   sodium chloride 0.9 % bolus 1,000 mL   EPINEPHrine (EPI-PEN) injection 0.3 mg   famotidine (PEPCID) IVPB 20 mg premix   methylPREDNISolone sodium succinate (SOLU-MEDROL) 125 mg/2 mL injection 125 mg    IV methylprednisolone will be converted to either a q12h or q24h frequency with the same total daily dose (TDD).  Ordered Dose: 1 to 125 mg TDD; convert to: TDD q24h.  Ordered Dose: 126 to 250 mg TDD; convert to: TDD div q12h.  Ordered Dose: >250 mg TDD; DAW.   LORazepam (ATIVAN) injection 4 mg   LORazepam (ATIVAN) 2 MG/ML injection    Berrier, Candace B: cabinet override   DISCONTD: levETIRAcetam (KEPPRA) 2,000 mg in sodium chloride 0.9 % 250 mL IVPB   FOLLOWED BY Linked Order Group    levETIRAcetam (KEPPRA) IVPB 1000 mg/100 mL premix    levETIRAcetam (KEPPRA) IVPB 1000 mg/100 mL premix   levETIRAcetam (KEPPRA) 1000 MG/100ML IVPB    Berrier, Candace B: cabinet override   LORazepam (ATIVAN) injection 4 mg   levETIRAcetam (KEPPRA) 2,000 mg in sodium chloride 0.9 % 250 mL IVPB   DISCONTD: zonisamide (ZONEGRAN) capsule 100 mg   DISCONTD: zonisamide (ZONEGRAN) capsule 200 mg   iohexol (OMNIPAQUE) 350 MG/ML injection 75 mL   heparin injection 5,000 Units   levETIRAcetam (KEPPRA) tablet 1,500 mg   vancomycin (VANCOREADY) IVPB 2000 mg/400 mL    Order Specific Question:   Indication:    Answer:   Sepsis   vancomycin (VANCOREADY) IVPB 750 mg/150 mL    Order Specific Question:   Indication:    Answer:   Sepsis   aztreonam (AZACTAM) 2 g in sodium chloride 0.9 % 100 mL IVPB    Order Specific Question:   Antibiotic Indication:    Answer:   Sepsis    Unresulted Labs (From admission, onward)     Start     Ordered   09/09/22 0500  CBC with Differential/Platelet  Tomorrow morning,   STAT        09/08/22 2100   09/09/22 0500  Comprehensive metabolic panel  Tomorrow morning,   STAT        09/08/22 2100   09/09/22 0500   Magnesium  Tomorrow morning,   STAT        09/08/22 2100   09/09/22 0500  Phosphorus  Tomorrow morning,   STAT        09/08/22 2100   09/09/22 0100  Culture, blood (Routine X 2) w Reflex to ID Panel  Once,   STAT        09/09/22 0100   09/08/22 2140  RPR  Once,   AD        09/08/22 2140   09/08/22 1940  Lyme Disease Serology w/Reflex  Add-on,   AD        09/08/22 1940   09/08/22 1940  ANA w/Reflex  Add-on,   AD        09/08/22 1940   09/08/22 1845  Urine Culture  (Urine Culture)  Add-on,   AD       Question:  Indication  Answer:  Altered mental status (if no other cause identified)   09/08/22 1844   09/08/22 1845  C-reactive protein  Once,   URGENT        09/08/22 1844   09/08/22 1845  Vitamin B12  Once,   URGENT        09/08/22 1844   09/08/22 1844  Culture, blood (Routine X 2) w Reflex to ID Panel  BLOOD CULTURE X 2,   STAT      09/08/22 1844   09/08/22 1843  Hemoglobin A1c  Once,   URGENT        09/08/22 1844   09/08/22 1539  Pathologist smear review  Once,   R        09/08/22 1539             Admission Imaging : CT Angio Chest Pulmonary Embolism (PE) W or WO Contrast  Result Date: 09/08/2022 CLINICAL DATA:  Pulmonary embolism (PE) suspected, high prob. Seizure-like activity. EXAM: CT ANGIOGRAPHY CHEST WITH CONTRAST TECHNIQUE: Multidetector CT imaging of the chest was performed using the standard protocol during bolus administration of intravenous contrast. Multiplanar CT image reconstructions and MIPs were obtained to evaluate the vascular anatomy. RADIATION DOSE REDUCTION: This exam was performed according to the departmental dose-optimization program which includes automated exposure control, adjustment of the mA and/or kV according to patient size and/or use of iterative reconstruction technique. CONTRAST:  75mL OMNIPAQUE IOHEXOL 350 MG/ML SOLN COMPARISON:  None Available. FINDINGS: Cardiovascular: Satisfactory opacification of the pulmonary arteries to the segmental  level. No evidence of pulmonary embolism. Normal heart size. No significant pericardial effusion. The thoracic aorta is normal in caliber. No atherosclerotic plaque of the thoracic aorta. No coronary artery calcifications. Mediastinum/Nodes: No enlarged mediastinal, hilar, or axillary lymph nodes. Thyroid gland, trachea, and esophagus demonstrate no significant findings. Lungs/Pleura: Bilateral lower lobe subsegmental atelectasis. No focal consolidation. No pulmonary nodule. No pulmonary mass. No pleural effusion. No pneumothorax. Upper Abdomen: No acute abnormality. Musculoskeletal: No chest wall abnormality. No suspicious lytic or blastic osseous lesions. No acute displaced fracture. Multilevel degenerative changes of the spine. Review of the MIP images confirms the above findings. IMPRESSION: 1. No pulmonary embolus. 2. No acute intrathoracic abnormality. Electronically Signed   By: Tish Frederickson M.D.   On: 09/08/2022 21:42   CT Head Wo Contrast  Result Date: 09/08/2022 CLINICAL DATA:  Headache, suspected allergic reaction, nausea EXAM: CT HEAD WITHOUT CONTRAST TECHNIQUE: Contiguous axial images were obtained from the base of the skull through the vertex without intravenous contrast. RADIATION DOSE REDUCTION: This exam was performed according to the departmental dose-optimization program which includes automated exposure control, adjustment of the mA and/or kV according to patient size and/or use of iterative reconstruction technique. COMPARISON:  06/24/2022 FINDINGS: Brain: No acute infarct or hemorrhage. Lateral ventricles and midline structures are unremarkable. No acute extra-axial fluid collections. No mass effect. Vascular: No hyperdense vessel or unexpected calcification. Skull: Normal. Negative for fracture or focal lesion. Sinuses/Orbits: No acute finding. Other: None. IMPRESSION: 1. No  acute intracranial process. Electronically Signed   By: Sharlet Salina M.D.   On: 09/08/2022 17:08   DG Chest  Port 1 View  Result Date: 09/08/2022 CLINICAL DATA:  Seizure, bitten on neck, suspected allergic reaction, given p.o. Benadryl and EpiPen, nausea EXAM: PORTABLE CHEST 1 VIEW COMPARISON:  Portable exam 1637 hours compared to 06/25/2022 FINDINGS: Upper normal size of cardiac silhouette with slight vascular congestion. Mediastinal contours normal. Lungs clear. No infiltrate, pleural effusion, or pneumothorax. IMPRESSION: No acute abnormalities. Electronically Signed   By: Ulyses Southward M.D.   On: 09/08/2022 17:04      Physical Examination: Vitals:   09/08/22 1800 09/08/22 1900 09/08/22 2157 09/09/22 0100  BP: 118/63 (!) 123/57  119/77  Pulse: 95 88  84  Temp:   98.3 F (36.8 C)   Resp: (!) 21 (!) 23  18  SpO2: 100% 100%  95%  TempSrc:   Axillary    Physical Exam Vitals reviewed.  Constitutional:      General: She is not in acute distress.    Appearance: She is not ill-appearing.  HENT:     Head: Normocephalic and atraumatic.     Right Ear: External ear normal.     Left Ear: External ear normal.     Mouth/Throat:     Mouth: Mucous membranes are dry.  Eyes:     Extraocular Movements: Extraocular movements intact.     Pupils: Pupils are equal, round, and reactive to light.  Cardiovascular:     Rate and Rhythm: Normal rate and regular rhythm.     Pulses: Normal pulses.     Heart sounds: Normal heart sounds.  Pulmonary:     Effort: Pulmonary effort is normal.     Breath sounds: Normal breath sounds.  Abdominal:     General: Bowel sounds are normal. There is no distension.     Palpations: Abdomen is soft. There is no mass.     Tenderness: There is no abdominal tenderness. There is no guarding.  Musculoskeletal:     Right lower leg: No edema.     Left lower leg: No edema.  Skin:    General: Skin is warm.  Neurological:     General: No focal deficit present.     Mental Status: She is lethargic.     GCS: GCS eye subscore is 3. GCS verbal subscore is 3. GCS motor subscore is 4.      Deep Tendon Reflexes:     Reflex Scores:      Bicep reflexes are 1+ on the right side and 1+ on the left side.      Patellar reflexes are 1+ on the right side and 1+ on the left side.    Assessment and Plan: * Seizures (HCC) We will continue with her anti AED meds and get pharmacy consult to start after med verification.  SZ precaution. Head CT negative.  We will obtain EEG. Neurology consulted.  Continue Keppra and zonegran.   Obesity (BMI 30.0-34.9) Pt's last A1c is 6.5 making her diabetic by definition. We will monitor POCT glucose.  Start med trial if pt agrees or may consider diet modification depending on pt's motivation and risk factors.    Headache Pt has a h/o  migraines and has migraine today.  We will start with tylenol and caffeine. Avoid ibuprofen for  GERD.  Limit meds for prevention of rebound headache.  Eye exam when discharged.    DVT prophylaxis:  Heparin   Code Status:  Full code    Family Communication:  Jakelyn, Squyres (Spouse)  [phone]    Disposition Plan:  Home    Consults called:  Neurology   Admission status: Inpatient    Unit/ Expected LOS: Progressive/ stepdown / 1-2 days.   Gertha Calkin MD [facility]  6 PM- 2 AM. Please contact me via secure Chat 6 PM-2 AM. [phone] ( Pager ) To contact the [facility] Attending or Consulting provider 7A - 7P or covering provider during after hours 7P -7A, for this patient.   Check the care team in [facility] and look for a) attending/consulting [facility] provider listed and b) the [facility] team listed Log into [email or web address] and use [place]'s universal password to access. If you do not have the password, please contact the hospital operator. Locate the [facility] provider you are looking for under [facility] and page to a number that you can be directly reached. If you still have difficulty reaching the provider, please page the [facility] (Director on Call) for the Hospitalists listed on amion for  assistance. [email or web address] 09/09/2022, 2:10 AM

## 2022-09-08 NOTE — ED Notes (Signed)
Dr. Georg Ruddle at bedside for assessment.

## 2022-09-08 NOTE — ED Notes (Signed)
Pt. Clothing removed, linens changed, peri care provided. Brief and purwick applied. Pt. Repositioned for comfort. Warm blankets applied. NAD.

## 2022-09-08 NOTE — ED Notes (Signed)
Called to restroom, patient was actively seizing on the toilet with daughter standing by holding her up. EDPA's Jonathon and Jacelyn as well as Dr. Sidney Ace to bathroom.  Pt given 2 mg IV ativan and assisted to stretcher. Returned to room. No injuries noted.  No fall.  Daughter reported pt does have hx of seizures.

## 2022-09-08 NOTE — ED Triage Notes (Signed)
Pt from work via EMS.  "Bitten on neck" and suspected allergic reaction. Was given epi pen and 50 mg po benadryl by staff at her work place.  VSS   Some nausea.  No actual vomiting.  No complaints at this time.

## 2022-09-08 NOTE — ED Notes (Signed)
pt. to ED rm 14 from flex 52 for seizure activity. this RN recieved report from [place], Charity fundraiser. Candace, this RN, Annice Pih, [place] and Dr. Sidney Ace and Dr. Alfred Levins at bedside. pt. is minimally responsive, but no seizure like activity. pt. responds to painful stimuli, moving all 4 extremities pt. to ED rm 14 from flex 52 for seizure activity. this RN recieved report from [place], Charity fundraiser. Candace, this RN, Annice Pih, [place] and Dr. Sidney Ace and Dr. Alfred Levins at bedside. pt. is minimally responsive, but no seizure like activity. pt. responds to painful stimuli, moving all 4 extremities

## 2022-09-08 NOTE — Consult Note (Addendum)
Pharmacy Antibiotic Note  [name] is a 51 y.o. female admitted on 09/08/2022 with sepsis.  Pharmacy has been consulted for Vancomycin and aztreonam dosing.  Plan: Aztreonam 2gm q 8hrs  Vancomycin 2000 mg IVPB x 1 load, then Vancomycin 750mg   IV Q 12 hours hrs.  Goal AUC 400-550. Expected AUC: 477 SCr used: 0.8 (actual 0.78)   Temp (24hrs), Avg:98 F (36.7 C), Min:98 F (36.7 C), Max:98 F (36.7 C)  Recent Labs  Lab 09/08/22 1539 09/08/22 1906  WBC 20.3*  --   CREATININE 0.78  --   LATICACIDVEN  --  2.1*    Estimated Creatinine Clearance: 95.2 mL/min (by C-G formula based on SCr of 0.78 mg/dL).    Allergies  Allergen Reactions   Amoxicillin Anaphylaxis   Fish Allergy Anaphylaxis   Morphine And Related Anaphylaxis   Penicillins Anaphylaxis    Has patient had a PCN reaction causing immediate rash, facial/tongue/throat swelling, SOB or lightheadedness with hypotension: Yes Has patient had a PCN reaction causing severe rash involving mucus membranes or skin necrosis: Yes Has patient had a PCN reaction that required hospitalization Yes Has patient had a PCN reaction occurring within the last 10 years: Yes If all of the above answers are "NO", then may proceed with Cephalosporin use.    Rocephin [Ceftriaxone Sodium In Dextrose] Anaphylaxis   Shellfish Allergy Anaphylaxis and Itching   Tylenol [Acetaminophen] Anaphylaxis   Topamax [Topiramate] Nausea And Vomiting   Tramadol Other (See Comments)    Unknown per SO    Antimicrobials this admission: 11/14 Vancomycin >>  11/14 Aztreonam >>  Dose adjustments this admission: none  Microbiology results: 11/14 BCx: in process  Thank you for allowing pharmacy to be a part of this patient's care.   Afton Lavalle Rodriguez-Guzman PharmD, BCPS 09/08/2022 9:44 PM

## 2022-09-09 ENCOUNTER — Ambulatory Visit: Payer: Commercial Managed Care - PPO

## 2022-09-09 DIAGNOSIS — R569 Unspecified convulsions: Secondary | ICD-10-CM | POA: Diagnosis not present

## 2022-09-09 DIAGNOSIS — D72828 Other elevated white blood cell count: Secondary | ICD-10-CM

## 2022-09-09 DIAGNOSIS — E669 Obesity, unspecified: Secondary | ICD-10-CM

## 2022-09-09 LAB — HEMOGLOBIN A1C
Hgb A1c MFr Bld: 6 % — ABNORMAL HIGH (ref 4.8–5.6)
Mean Plasma Glucose: 125.5 mg/dL

## 2022-09-09 LAB — CBG MONITORING, ED
Glucose-Capillary: 147 mg/dL — ABNORMAL HIGH (ref 70–99)
Glucose-Capillary: 224 mg/dL — ABNORMAL HIGH (ref 70–99)
Glucose-Capillary: 170 mg/dL — ABNORMAL HIGH (ref 70–99)

## 2022-09-09 LAB — PATHOLOGIST SMEAR REVIEW

## 2022-09-09 LAB — CBC WITH DIFFERENTIAL/PLATELET
Abs Immature Granulocytes: 0.14 10*3/uL — ABNORMAL HIGH (ref 0.00–0.07)
Basophils Absolute: 0 10*3/uL (ref 0.0–0.1)
Basophils Relative: 0 %
Eosinophils Absolute: 0 10*3/uL (ref 0.0–0.5)
Eosinophils Relative: 0 %
HCT: 36.1 % (ref 36.0–46.0)
Hemoglobin: 11.5 g/dL — ABNORMAL LOW (ref 12.0–15.0)
Immature Granulocytes: 1 %
Lymphocytes Relative: 12 %
Lymphs Abs: 2.8 10*3/uL (ref 0.7–4.0)
MCH: 25.4 pg — ABNORMAL LOW (ref 26.0–34.0)
MCHC: 31.9 g/dL (ref 30.0–36.0)
MCV: 79.9 fL — ABNORMAL LOW (ref 80.0–100.0)
Monocytes Absolute: 0.6 10*3/uL (ref 0.1–1.0)
Monocytes Relative: 3 %
Neutro Abs: 19.9 10*3/uL — ABNORMAL HIGH (ref 1.7–7.7)
Neutrophils Relative %: 84 %
Platelets: 336 10*3/uL (ref 150–400)
RBC: 4.52 MIL/uL (ref 3.87–5.11)
RDW: 15.2 % (ref 11.5–15.5)
WBC: 23.6 10*3/uL — ABNORMAL HIGH (ref 4.0–10.5)
nRBC: 0 % (ref 0.0–0.2)

## 2022-09-09 LAB — C-REACTIVE PROTEIN: CRP: 0.5 mg/dL (ref <1.0)

## 2022-09-09 LAB — COMPREHENSIVE METABOLIC PANEL
ALT: 18 U/L (ref 0–44)
AST: 19 U/L (ref 15–41)
Albumin: 3.4 g/dL — ABNORMAL LOW (ref 3.5–5.0)
Alkaline Phosphatase: 78 U/L (ref 38–126)
Anion gap: 9 (ref 5–15)
BUN: 14 mg/dL (ref 6–20)
CO2: 21 mmol/L — ABNORMAL LOW (ref 22–32)
Calcium: 9.4 mg/dL (ref 8.9–10.3)
Chloride: 116 mmol/L — ABNORMAL HIGH (ref 98–111)
Creatinine, Ser: 0.54 mg/dL (ref 0.44–1.00)
GFR, Estimated: >60 mL/min (ref >60)
Glucose, Bld: 127 mg/dL — ABNORMAL HIGH (ref 70–99)
Potassium: 3.5 mmol/L (ref 3.5–5.1)
Sodium: 146 mmol/L — ABNORMAL HIGH (ref 135–145)
Total Bilirubin: 0.4 mg/dL (ref 0.3–1.2)
Total Protein: 6.7 g/dL (ref 6.5–8.1)

## 2022-09-09 LAB — VITAMIN B12: Vitamin B-12: 188 pg/mL (ref 180–914)

## 2022-09-09 LAB — RPR: RPR Ser Ql: NONREACTIVE

## 2022-09-09 LAB — MAGNESIUM: Magnesium: 1.9 mg/dL (ref 1.7–2.4)

## 2022-09-09 LAB — GLUCOSE, CAPILLARY: Glucose-Capillary: 194 mg/dL — ABNORMAL HIGH (ref 70–99)

## 2022-09-09 LAB — PHOSPHORUS: Phosphorus: 3.4 mg/dL (ref 2.5–4.6)

## 2022-09-09 MED ORDER — VITAMIN B-12 1000 MCG PO TABS
2000.0000 ug | ORAL_TABLET | Freq: Every day | ORAL | Status: DC
  Administered 2022-09-09 – 2022-09-10 (×2): 2000 ug via ORAL
  Filled 2022-09-09: qty 4
  Filled 2022-09-09: qty 2

## 2022-09-09 MED ORDER — MELATONIN 5 MG PO TABS
5.0000 mg | ORAL_TABLET | Freq: Every day | ORAL | Status: DC
  Administered 2022-09-09: 5 mg via ORAL
  Filled 2022-09-09: qty 1

## 2022-09-09 MED ORDER — ZONISAMIDE 100 MG PO CAPS
200.0000 mg | ORAL_CAPSULE | Freq: Every evening | ORAL | Status: DC
  Administered 2022-09-09: 200 mg via ORAL
  Filled 2022-09-09 (×3): qty 2

## 2022-09-09 MED ORDER — ZONISAMIDE 100 MG PO CAPS
100.0000 mg | ORAL_CAPSULE | Freq: Every morning | ORAL | Status: DC
  Administered 2022-09-10: 100 mg via ORAL
  Filled 2022-09-09: qty 1

## 2022-09-09 MED ORDER — LEVETIRACETAM ER 750 MG PO TB24
2250.0000 mg | ORAL_TABLET | Freq: Every day | ORAL | Status: DC
  Administered 2022-09-09: 2250 mg via ORAL
  Filled 2022-09-09 (×2): qty 3

## 2022-09-09 NOTE — Progress Notes (Signed)
Eeg done 

## 2022-09-09 NOTE — Procedures (Signed)
Patient Name: [name]  MRN: [id number]  Epilepsy Attending: Charlsie Quest  Referring Physician/Provider: Caryl Pina, MD  Date: 09/09/2022 Duration: 24.56 mins  Patient history: 51 year old female with a history of epilepsy, presenting with seizure recurrence due to anticonvulsant noncompliance. EEG to evaluate for seizure.   Level of alertness: Awake, asleep  AEDs during EEG study: LEV  Technical aspects: This EEG study was done with scalp electrodes positioned according to the 10-20 International system of electrode placement. Electrical activity was reviewed with band pass filter of 1-70Hz , sensitivity of 7 uV/mm, display speed of 4mm/sec with a 60Hz  notched filter applied as appropriate. EEG data were recorded continuously and digitally stored.  Video monitoring was available and reviewed as appropriate.  Description: The posterior dominant rhythm consists of 10 Hz activity of moderate voltage (25-35 uV) seen predominantly in posterior head regions, symmetric and reactive to eye opening and eye closing. Sleep was characterized by vertex waves, sleep spindles (12 to 14 Hz), maximal frontocentral region. Hyperventilation and photic stimulation were not performed.     IMPRESSION: This study is within normal limits. No seizures or epileptiform discharges were seen throughout the recording.  A normal interictal EEG does not exclude the diagnosis of epilepsy.  [name] 

## 2022-09-09 NOTE — Assessment & Plan Note (Signed)
Pt has a h/o  migraines and has migraine today.  We will start with tylenol and caffeine. Avoid ibuprofen for  GERD.  Limit meds for prevention of rebound headache.  Eye exam when discharged.

## 2022-09-09 NOTE — Assessment & Plan Note (Addendum)
We will continue with her anti AED meds and get pharmacy consult to start after med verification.  SZ precaution. Head CT negative.  We will obtain EEG. Neurology consulted.  Continue Keppra and zonegran.

## 2022-09-09 NOTE — Assessment & Plan Note (Signed)
Pt's last A1c is 6.5 making her diabetic by definition. We will monitor POCT glucose.  Start med trial if pt agrees or may consider diet modification depending on pt's motivation and risk factors.

## 2022-09-09 NOTE — ED Notes (Signed)
Informed RN bed assigned 

## 2022-09-09 NOTE — Progress Notes (Signed)
PROGRESS NOTE    [name]  WGN:[id number] DOB: 04-21-1971 DOA: 09/08/2022 PCP: Corky Downs, MD   Assessment & Plan:   Principal Problem:   Seizures (HCC) Active Problems:   Headache   Obesity (BMI 30.0-34.9)  Assessment and Plan: Seizures: often did not take her keppra as prescribed. CT head was neg. EEG is WNL, no seizures or epileptiform discharges were seen throughout recording. Change keppra immediate release to keppra XR as per neuro   Sepsis r/o: d/c abxs. Will monitor   DM2: HbA1c 6.0, well controlled. Diet controlled    Obesity: would benefit from weight loss    Headache: w/ hx of migraines. Not on any meds for this as per med rec   Leukocytosis: etiology unclear, possibly reactive vs steroid use. Urine cx is pending. Blood cxs NGTD     DVT prophylaxis: heparin  Code Status: full  Family Communication: discussed pt's care w/ pt's family at bedside and answered their questions  Disposition Plan: likely d/c back home  Level of care: Progressive  Status is: Inpatient Remains inpatient appropriate because: severity of illness    Consultants:  Neuro   Procedures:    Antimicrobials:   Subjective: Pt c/o malaise   Objective: Vitals:   09/09/22 0100 09/09/22 0300 09/09/22 0410 09/09/22 0730  BP: 119/77 138/87    Pulse: 84 81  88  Resp: 18 20  20   Temp:   98.2 F (36.8 C)   TempSrc:   Oral   SpO2: 95% 93%  95%    Intake/Output Summary (Last 24 hours) at 09/09/2022 0926 Last data filed at 09/09/2022 0734 Gross per 24 hour  Intake 2070 ml  Output --  Net 2070 ml   There were no vitals filed for this visit.  Examination:  General exam: Appears calm and comfortable  Respiratory system: Clear to auscultation. Respiratory effort normal. Cardiovascular system: S1 & S2+. No rubs, gallops or clicks. Gastrointestinal system: Abdomen is nondistended, soft and nontender. Normal bowel sounds heard. Central nervous system: Alert and oriented.  Moves all extremities Psychiatry: Judgement and insight appear normal. Flat mood & affect.     Data Reviewed: I have personally reviewed following labs and imaging studies  CBC: Recent Labs  Lab 09/08/22 1539 09/09/22 0658  WBC 20.3* 23.6*  NEUTROABS 10.2* 19.9*  HGB 12.6 11.5*  HCT 40.0 36.1  MCV 80.8 79.9*  PLT 374 336   Basic Metabolic Panel: Recent Labs  Lab 09/08/22 1539 09/09/22 0658  NA 143 146*  K 3.1* 3.5  CL 109 116*  CO2 24 21*  GLUCOSE 149* 127*  BUN 14 14  CREATININE 0.78 0.54  CALCIUM 9.4 9.4  MG  --  1.9  PHOS  --  3.4   GFR: Estimated Creatinine Clearance: 95.2 mL/min (by C-G formula based on SCr of 0.54 mg/dL). Liver Function Tests: Recent Labs  Lab 09/08/22 1539 09/09/22 0658  AST 26 19  ALT 23 18  ALKPHOS 82 78  BILITOT 0.6 0.4  PROT 7.6 6.7  ALBUMIN 4.0 3.4*   No results for input(s): "LIPASE", "AMYLASE" in the last 168 hours. No results for input(s): "AMMONIA" in the last 168 hours. Coagulation Profile: No results for input(s): "INR", "PROTIME" in the last 168 hours. Cardiac Enzymes: Recent Labs  Lab 09/08/22 1855  CKTOTAL 128   BNP (last 3 results) No results for input(s): "PROBNP" in the last 8760 hours. HbA1C: Recent Labs    09/08/22 1855  HGBA1C 6.0*   CBG: Recent  Labs  Lab 09/08/22 1634 09/09/22 0243  GLUCAP 152* 147*   Lipid Profile: No results for input(s): "CHOL", "HDL", "LDLCALC", "TRIG", "CHOLHDL", "LDLDIRECT" in the last 72 hours. Thyroid Function Tests: No results for input(s): "TSH", "T4TOTAL", "FREET4", "T3FREE", "THYROIDAB" in the last 72 hours. Anemia Panel: Recent Labs    09/08/22 1855  VITAMINB12 188   Sepsis Labs: Recent Labs  Lab 09/08/22 1855 09/08/22 1906 09/08/22 2148  PROCALCITON 0.21  --   --   LATICACIDVEN  --  2.1* 1.7    Recent Results (from the past 240 hour(s))  Culture, blood (Routine X 2) w Reflex to ID Panel     Status: None (Preliminary result)   Collection Time:  09/08/22  6:55 PM   Specimen: BLOOD  Result Value Ref Range Status   Specimen Description BLOOD RIGHT ANTECUBITAL  Final   Special Requests   Final    BOTTLES DRAWN AEROBIC AND ANAEROBIC Blood Culture adequate volume   Culture   Final    NO GROWTH < 12 HOURS Performed at [facility], [address] [place], [place] [address]    Report Status PENDING  Incomplete         Radiology Studies: CT Angio Chest Pulmonary Embolism (PE) W or WO Contrast  Result Date: 09/08/2022 CLINICAL DATA:  Pulmonary embolism (PE) suspected, high prob. Seizure-like activity. EXAM: CT ANGIOGRAPHY CHEST WITH CONTRAST TECHNIQUE: Multidetector CT imaging of the chest was performed using the standard protocol during bolus administration of intravenous contrast. Multiplanar CT image reconstructions and MIPs were obtained to evaluate the vascular anatomy. RADIATION DOSE REDUCTION: This exam was performed according to the departmental dose-optimization program which includes automated exposure control, adjustment of the mA and/or kV according to patient size and/or use of iterative reconstruction technique. CONTRAST:  88mL OMNIPAQUE IOHEXOL 350 MG/ML SOLN COMPARISON:  None Available. FINDINGS: Cardiovascular: Satisfactory opacification of the pulmonary arteries to the segmental level. No evidence of pulmonary embolism. Normal heart size. No significant pericardial effusion. The thoracic aorta is normal in caliber. No atherosclerotic plaque of the thoracic aorta. No coronary artery calcifications. Mediastinum/Nodes: No enlarged mediastinal, hilar, or axillary lymph nodes. Thyroid gland, trachea, and esophagus demonstrate no significant findings. Lungs/Pleura: Bilateral lower lobe subsegmental atelectasis. No focal consolidation. No pulmonary nodule. No pulmonary mass. No pleural effusion. No pneumothorax. Upper Abdomen: No acute abnormality. Musculoskeletal: No chest wall abnormality. No suspicious lytic or blastic  osseous lesions. No acute displaced fracture. Multilevel degenerative changes of the spine. Review of the MIP images confirms the above findings. IMPRESSION: 1. No pulmonary embolus. 2. No acute intrathoracic abnormality. Electronically Signed   By: Tish Frederickson M.D.   On: 09/08/2022 21:42   CT Head Wo Contrast  Result Date: 09/08/2022 CLINICAL DATA:  Headache, suspected allergic reaction, nausea EXAM: CT HEAD WITHOUT CONTRAST TECHNIQUE: Contiguous axial images were obtained from the base of the skull through the vertex without intravenous contrast. RADIATION DOSE REDUCTION: This exam was performed according to the departmental dose-optimization program which includes automated exposure control, adjustment of the mA and/or kV according to patient size and/or use of iterative reconstruction technique. COMPARISON:  06/24/2022 FINDINGS: Brain: No acute infarct or hemorrhage. Lateral ventricles and midline structures are unremarkable. No acute extra-axial fluid collections. No mass effect. Vascular: No hyperdense vessel or unexpected calcification. Skull: Normal. Negative for fracture or focal lesion. Sinuses/Orbits: No acute finding. Other: None. IMPRESSION: 1. No acute intracranial process. Electronically Signed   By: Sharlet Salina M.D.   On: 09/08/2022 17:08  DG Chest Port 1 View  Result Date: 09/08/2022 CLINICAL DATA:  Seizure, bitten on neck, suspected allergic reaction, given p.o. Benadryl and EpiPen, nausea EXAM: PORTABLE CHEST 1 VIEW COMPARISON:  Portable exam 1637 hours compared to 06/25/2022 FINDINGS: Upper normal size of cardiac silhouette with slight vascular congestion. Mediastinal contours normal. Lungs clear. No infiltrate, pleural effusion, or pneumothorax. IMPRESSION: No acute abnormalities. Electronically Signed   By: Ulyses Southward M.D.   On: 09/08/2022 17:04        Scheduled Meds:  heparin  5,000 Units Subcutaneous Q8H   levETIRAcetam  1,500 mg Oral BID   Continuous Infusions:   aztreonam Stopped (09/09/22 0734)   levETIRAcetam     vancomycin 750 mg (09/09/22 0745)     LOS: 1 day    Time spent: 35 mins     Charise Killian, MD [facility] Pager [phone]  If 7PM-7AM, please contact night-coverage [email or web address] 09/09/2022, 9:26 AM

## 2022-09-09 NOTE — Progress Notes (Signed)
Subjective: Now awake and alert. Slept well overnight.   Objective: Current vital signs: BP 138/87   Pulse 88   Temp 98.2 F (36.8 C) (Oral)   Resp 20   SpO2 95%  Vital signs in last 24 hours: Temp:  [98 F (36.7 C)-98.3 F (36.8 C)] 98.2 F (36.8 C) (11/15 0410) Pulse Rate:  [81-107] 88 (11/15 0730) Resp:  [16-24] 20 (11/15 0730) BP: (104-145)/(57-87) 138/87 (11/15 0300) SpO2:  [93 %-100 %] 95 % (11/15 0730)  Intake/Output from previous day: 11/14 0701 - 11/15 0700 In: 1970 [IV Piggyback:1970] Out: -  Intake/Output this shift: Total I/O In: 100 [IV Piggyback:100] Out: -  Nutritional status:  Diet Order             Diet regular Room service appropriate? Yes; Fluid consistency: Thin  Diet effective now                  HEENT: [place]/AT Lungs: Respirations unlabored Ext: No edema  Neurologic Exam: Mildly drowsy after being awakened from sleep. Speech is fluent with intact comprehension. Oriented and able to answer all questions regarding her seizure history and medication use, but does not remember much if anything of yesterday.  CN: PERRL, EOMI, no nystagmus, temp sensation equal, smile symmetric, phonation intact.  Motor: 5/5 RUE and RLE 4/5 LUE and LLE Sensory: Intact to FT x 4. No extinction to DSS.  Cerebellar: No ataxia with FNF bilaterally Gait: Deferred  Lab Results: Results for orders placed or performed during the hospital encounter of 09/08/22 (from the past 48 hour(s))  CBC with Differential     Status: Abnormal   Collection Time: 09/08/22  3:39 PM  Result Value Ref Range   WBC 20.3 (H) 4.0 - 10.5 K/uL   RBC 4.95 3.87 - 5.11 MIL/uL   Hemoglobin 12.6 12.0 - 15.0 g/dL   HCT 63.1 49.7 - 02.6 %   MCV 80.8 80.0 - 100.0 fL   MCH 25.5 (L) 26.0 - 34.0 pg   MCHC 31.5 30.0 - 36.0 g/dL   RDW 37.8 58.8 - 50.2 %   Platelets 374 150 - 400 K/uL   nRBC 0.0 0.0 - 0.2 %   Neutrophils Relative % 50 %   Neutro Abs 10.2 (H) 1.7 - 7.7 K/uL   Lymphocytes Relative  43 %   Lymphs Abs 8.7 (H) 0.7 - 4.0 K/uL   Monocytes Relative 5 %   Monocytes Absolute 1.0 0.1 - 1.0 K/uL   Eosinophils Relative 2 %   Eosinophils Absolute 0.3 0.0 - 0.5 K/uL   Basophils Relative 0 %   Basophils Absolute 0.1 0.0 - 0.1 K/uL   WBC Morphology Abnormal lymphocytes present    RBC Morphology MORPHOLOGY UNREMARKABLE    Smear Review Normal platelet morphology    Immature Granulocytes 0 %   Abs Immature Granulocytes 0.08 (H) 0.00 - 0.07 K/uL    Comment: Performed at [facility], [address] [place], [place] [address]  Comprehensive metabolic panel     Status: Abnormal   Collection Time: 09/08/22  3:39 PM  Result Value Ref Range   Sodium 143 135 - 145 mmol/L   Potassium 3.1 (L) 3.5 - 5.1 mmol/L   Chloride 109 98 - 111 mmol/L   CO2 24 22 - 32 mmol/L   Glucose, Bld 149 (H) 70 - 99 mg/dL    Comment: Glucose reference range applies only to samples taken after fasting for at least 8 hours.   BUN 14 6 -  20 mg/dL   Creatinine, Ser 2.35 0.44 - 1.00 mg/dL   Calcium 9.4 8.9 - 36.1 mg/dL   Total Protein 7.6 6.5 - 8.1 g/dL   Albumin 4.0 3.5 - 5.0 g/dL   AST 26 15 - 41 U/L   ALT 23 0 - 44 U/L   Alkaline Phosphatase 82 38 - 126 U/L   Total Bilirubin 0.6 0.3 - 1.2 mg/dL   GFR, Estimated >44 >31 mL/min    Comment: (NOTE) Calculated using the CKD-EPI Creatinine Equation (2021)    Anion gap 10 5 - 15    Comment: Performed at [facility], [address], [place], [place] [address]  Pathologist smear review     Status: None   Collection Time: 09/08/22  3:39 PM  Result Value Ref Range   Path Review Blood smear is reviewed.     Comment: Leukocytosis, with normal differential, and minimally left shifted maturation and few atypical/reactive lymphocytes. No increase in circulating blasts. Normocytic erythrocytes without significant anisopoikilocytosis. Normal platelet count and morphology. The cause of the patient's leukocytosis is unclear from morphologic review  alone. Given the patient's clinical history, this could likely be secondary to acute stress response or medication (epinephrine) administration. Clinical correlation is recommended. Reviewed by Beryle Quant, M.D. Performed at [facility], [address] [place], [place] [address]   RPR     Status: None   Collection Time: 09/08/22  3:39 PM  Result Value Ref Range   RPR Ser Ql NON REACTIVE NON REACTIVE    Comment: Performed at [facility], [address], [place], [place] [address]  POC CBG, ED     Status: Abnormal   Collection Time: 09/08/22  4:34 PM  Result Value Ref Range   Glucose-Capillary 152 (H) 70 - 99 mg/dL    Comment: Glucose reference range applies only to samples taken after fasting for at least 8 hours.  Blood gas, arterial     Status: Abnormal   Collection Time: 09/08/22  6:55 PM  Result Value Ref Range   Delivery systems ROOM AIR    pH, Arterial 7.43 7.35 - 7.45   pCO2 arterial 37 32 - 48 mmHg   pO2, Arterial 81 (L) 83 - 108 mmHg   Bicarbonate 24.6 20.0 - 28.0 mmol/L   Acid-Base Excess 0.5 0.0 - 2.0 mmol/L   O2 Saturation 99.8 %   Patient temperature 37.0    Collection site LEFT BRACHIAL    Allens test (pass/fail) PASS PASS    Comment: Performed at [facility], [address] [place], [place] [address]  Troponin I (High Sensitivity)     Status: None   Collection Time: 09/08/22  6:55 PM  Result Value Ref Range   Troponin I (High Sensitivity) 11 <18 ng/L    Comment: (NOTE) Elevated high sensitivity troponin I (hsTnI) values and significant  changes across serial measurements may suggest ACS but many other  chronic and acute conditions are known to elevate hsTnI results.  Refer to the "Links" section for chest pain algorithms and additional  guidance. Performed at [facility], [address] [place], [place] [address]   Hemoglobin A1c     Status: Abnormal   Collection Time: 09/08/22  6:55 PM  Result Value Ref Range    Hgb A1c MFr Bld 6.0 (H) 4.8 - 5.6 %    Comment: (NOTE) Pre diabetes:          5.7%-6.4%  Diabetes:              >  6.4%  Glycemic control for   <7.0% adults with diabetes    Mean Plasma Glucose 125.5 mg/dL    Comment: Performed at [facility], [address] [place], [place] [address]  CK     Status: None   Collection Time: 09/08/22  6:55 PM  Result Value Ref Range   Total CK 128 38 - 234 U/L    Comment: Performed at [facility], [address] [place], [place] [address]  Procalcitonin - Baseline     Status: None   Collection Time: 09/08/22  6:55 PM  Result Value Ref Range   Procalcitonin 0.21 ng/mL    Comment:        Interpretation: PCT (Procalcitonin) <= 0.5 ng/mL: Systemic infection (sepsis) is not likely. Local bacterial infection is possible. (NOTE)       Sepsis PCT Algorithm           Lower Respiratory Tract                                      Infection PCT Algorithm    ----------------------------     ----------------------------         PCT < 0.25 ng/mL                PCT < 0.10 ng/mL          Strongly encourage             Strongly discourage   discontinuation of antibiotics    initiation of antibiotics    ----------------------------     -----------------------------       PCT 0.25 - 0.50 ng/mL            PCT 0.10 - 0.25 ng/mL               OR       >80% decrease in PCT            Discourage initiation of                                            antibiotics      Encourage discontinuation           of antibiotics    ----------------------------     -----------------------------         PCT >= 0.50 ng/mL              PCT 0.26 - 0.50 ng/mL               AND        <80% decrease in PCT             Encourage initiation of                                             antibiotics       Encourage continuation           of antibiotics    ----------------------------     -----------------------------        PCT >= 0.50 ng/mL                  PCT >  0.50 ng/mL               AND         increase in PCT                  Strongly encourage                                      initiation of antibiotics    Strongly encourage escalation           of antibiotics                                     -----------------------------                                           PCT <= 0.25 ng/mL                                                 OR                                        > 80% decrease in PCT                                      Discontinue / Do not initiate                                             antibiotics  Performed at [facility], [address] [place], [place] [address]   Culture, blood (Routine X 2) w Reflex to ID Panel     Status: None (Preliminary result)   Collection Time: 09/08/22  6:55 PM   Specimen: BLOOD  Result Value Ref Range   Specimen Description BLOOD RIGHT ANTECUBITAL    Special Requests      BOTTLES DRAWN AEROBIC AND ANAEROBIC Blood Culture adequate volume   Culture      NO GROWTH < 12 HOURS Performed at [facility], [address] [place], [place] [address]    Report Status PENDING   C-reactive protein     Status: None   Collection Time: 09/08/22  6:55 PM  Result Value Ref Range   CRP 0.5 <1.0 mg/dL    Comment: Performed at [facility], [address], [place], [place] [address]  Sedimentation rate     Status: None   Collection Time: 09/08/22  6:55 PM  Result Value Ref Range   Sed Rate 28 0 - 30 mm/hr    Comment: Performed at [facility], [address] [place], [place] [address]  Ethanol     Status: None   Collection Time: 09/08/22  6:55 PM  Result Value Ref Range   Alcohol, Ethyl (B) <10 <10 mg/dL  Comment: (NOTE) Lowest detectable limit for serum alcohol is 10 mg/dL.  For medical purposes only. Performed at [facility], [address] [place], [place] [address]   Vitamin B12     Status: None   Collection Time: 09/08/22  6:55 PM   Result Value Ref Range   Vitamin B-12 188 180 - 914 pg/mL    Comment: (NOTE) This assay is not validated for testing neonatal or myeloproliferative syndrome specimens for Vitamin B12 levels. Performed at [facility], [address], [place], [place] [address]   D-dimer, quantitative     Status: Abnormal   Collection Time: 09/08/22  7:05 PM  Result Value Ref Range   D-Dimer, Quant 0.70 (H) 0.00 - 0.50 ug/mL-FEU    Comment: (NOTE) At the manufacturer cut-off value of 0.5 g/mL FEU, this assay has a negative predictive value of 95-100%.This assay is intended for use in conjunction with a clinical pretest probability (PTP) assessment model to exclude pulmonary embolism (PE) and deep venous thrombosis (DVT) in outpatients suspected of PE or DVT. Results should be correlated with clinical presentation. Performed at [facility], [address] [place], [place] [address]   APTT     Status: Abnormal   Collection Time: 09/08/22  7:05 PM  Result Value Ref Range   aPTT 23 (L) 24 - 36 seconds    Comment: Performed at [facility], [address] [place], [place] [address]  Lactic acid, plasma     Status: Abnormal   Collection Time: 09/08/22  7:06 PM  Result Value Ref Range   Lactic Acid, Venous 2.1 (HH) 0.5 - 1.9 mmol/L    Comment: CRITICAL RESULT CALLED TO, READ BACK BY AND VERIFIED WITH HENRY AUSTIN 09/08/22 2040 MU Performed at [facility], [address] [place], [place] [address]   Lactic acid, plasma     Status: None   Collection Time: 09/08/22  9:48 PM  Result Value Ref Range   Lactic Acid, Venous 1.7 0.5 - 1.9 mmol/L    Comment: Performed at [facility], [address], [place], [place] [address]  Troponin I (High Sensitivity)     Status: None   Collection Time: 09/08/22  9:48 PM  Result Value Ref Range   Troponin I (High Sensitivity) 9 <18 ng/L    Comment: (NOTE) Elevated high sensitivity troponin I (hsTnI) values and  significant  changes across serial measurements may suggest ACS but many other  chronic and acute conditions are known to elevate hsTnI results.  Refer to the "Links" section for chest pain algorithms and additional  guidance. Performed at [facility], [address] [place], [place] [address]   Urinalysis, Complete w Microscopic Urine, Clean Catch     Status: Abnormal   Collection Time: 09/08/22  9:56 PM  Result Value Ref Range   Color, Urine STRAW (A) YELLOW   APPearance CLEAR (A) CLEAR   Specific Gravity, Urine 1.006 1.005 - 1.030   pH 6.0 5.0 - 8.0   Glucose, UA NEGATIVE NEGATIVE mg/dL   Hgb urine dipstick LARGE (A) NEGATIVE   Bilirubin Urine NEGATIVE NEGATIVE   Ketones, ur NEGATIVE NEGATIVE mg/dL   Protein, ur NEGATIVE NEGATIVE mg/dL   Nitrite NEGATIVE NEGATIVE   Leukocytes,Ua NEGATIVE NEGATIVE   RBC / HPF 11-20 0 - 5 RBC/hpf   WBC, UA 0-5 0 - 5 WBC/hpf   Bacteria, UA RARE (A) NONE SEEN   Squamous Epithelial / LPF 0-5 0 - 5  Mucus PRESENT     Comment: Performed at [facility], [address] [place], [place] [address]  CBG monitoring, ED     Status: Abnormal   Collection Time: 09/09/22  2:43 AM  Result Value Ref Range   Glucose-Capillary 147 (H) 70 - 99 mg/dL    Comment: Glucose reference range applies only to samples taken after fasting for at least 8 hours.  CBC with Differential/Platelet     Status: Abnormal   Collection Time: 09/09/22  6:58 AM  Result Value Ref Range   WBC 23.6 (H) 4.0 - 10.5 K/uL   RBC 4.52 3.87 - 5.11 MIL/uL   Hemoglobin 11.5 (L) 12.0 - 15.0 g/dL   HCT 81.136.1 91.436.0 - 78.246.0 %   MCV 79.9 (L) 80.0 - 100.0 fL   MCH 25.4 (L) 26.0 - 34.0 pg   MCHC 31.9 30.0 - 36.0 g/dL   RDW 95.615.2 21.311.5 - 08.615.5 %   Platelets 336 150 - 400 K/uL   nRBC 0.0 0.0 - 0.2 %   Neutrophils Relative % 84 %   Neutro Abs 19.9 (H) 1.7 - 7.7 K/uL   Lymphocytes Relative 12 %   Lymphs Abs 2.8 0.7 - 4.0 K/uL   Monocytes Relative 3 %   Monocytes Absolute 0.6 0.1  - 1.0 K/uL   Eosinophils Relative 0 %   Eosinophils Absolute 0.0 0.0 - 0.5 K/uL   Basophils Relative 0 %   Basophils Absolute 0.0 0.0 - 0.1 K/uL   Immature Granulocytes 1 %   Abs Immature Granulocytes 0.14 (H) 0.00 - 0.07 K/uL    Comment: Performed at [facility], [address] [place], [place] [address]  Comprehensive metabolic panel     Status: Abnormal   Collection Time: 09/09/22  6:58 AM  Result Value Ref Range   Sodium 146 (H) 135 - 145 mmol/L   Potassium 3.5 3.5 - 5.1 mmol/L   Chloride 116 (H) 98 - 111 mmol/L   CO2 21 (L) 22 - 32 mmol/L   Glucose, Bld 127 (H) 70 - 99 mg/dL    Comment: Glucose reference range applies only to samples taken after fasting for at least 8 hours.   BUN 14 6 - 20 mg/dL   Creatinine, Ser 9.620.54 0.44 - 1.00 mg/dL   Calcium 9.4 8.9 - 95.210.3 mg/dL   Total Protein 6.7 6.5 - 8.1 g/dL   Albumin 3.4 (L) 3.5 - 5.0 g/dL   AST 19 15 - 41 U/L   ALT 18 0 - 44 U/L   Alkaline Phosphatase 78 38 - 126 U/L   Total Bilirubin 0.4 0.3 - 1.2 mg/dL   GFR, Estimated >84>60 >13>60 mL/min    Comment: (NOTE) Calculated using the CKD-EPI Creatinine Equation (2021)    Anion gap 9 5 - 15    Comment: Performed at [facility], [address] [place], [place] [address]  Magnesium     Status: None   Collection Time: 09/09/22  6:58 AM  Result Value Ref Range   Magnesium 1.9 1.7 - 2.4 mg/dL    Comment: Performed at [facility], [address] [place], [place] [address]  Phosphorus     Status: None   Collection Time: 09/09/22  6:58 AM  Result Value Ref Range   Phosphorus 3.4 2.5 - 4.6 mg/dL    Comment: Performed at [facility], [address] [place], [place] [address]    Recent Results (from the past 240 hour(s))  Culture, blood (Routine  X 2) w Reflex to ID Panel     Status: None (Preliminary result)   Collection Time: 09/08/22  6:55 PM   Specimen: BLOOD  Result Value Ref Range Status   Specimen Description BLOOD RIGHT ANTECUBITAL   Final   Special Requests   Final    BOTTLES DRAWN AEROBIC AND ANAEROBIC Blood Culture adequate volume   Culture   Final    NO GROWTH < 12 HOURS Performed at [facility], [address], [place], [place] [address]    Report Status PENDING  Incomplete    Lipid Panel No results for input(s): "CHOL", "TRIG", "HDL", "CHOLHDL", "VLDL", "LDLCALC" in the last 72 hours.  Studies/Results: CT Angio Chest Pulmonary Embolism (PE) W or WO Contrast  Result Date: 09/08/2022 CLINICAL DATA:  Pulmonary embolism (PE) suspected, high prob. Seizure-like activity. EXAM: CT ANGIOGRAPHY CHEST WITH CONTRAST TECHNIQUE: Multidetector CT imaging of the chest was performed using the standard protocol during bolus administration of intravenous contrast. Multiplanar CT image reconstructions and MIPs were obtained to evaluate the vascular anatomy. RADIATION DOSE REDUCTION: This exam was performed according to the departmental dose-optimization program which includes automated exposure control, adjustment of the mA and/or kV according to patient size and/or use of iterative reconstruction technique. CONTRAST:  69mL OMNIPAQUE IOHEXOL 350 MG/ML SOLN COMPARISON:  None Available. FINDINGS: Cardiovascular: Satisfactory opacification of the pulmonary arteries to the segmental level. No evidence of pulmonary embolism. Normal heart size. No significant pericardial effusion. The thoracic aorta is normal in caliber. No atherosclerotic plaque of the thoracic aorta. No coronary artery calcifications. Mediastinum/Nodes: No enlarged mediastinal, hilar, or axillary lymph nodes. Thyroid gland, trachea, and esophagus demonstrate no significant findings. Lungs/Pleura: Bilateral lower lobe subsegmental atelectasis. No focal consolidation. No pulmonary nodule. No pulmonary mass. No pleural effusion. No pneumothorax. Upper Abdomen: No acute abnormality. Musculoskeletal: No chest wall abnormality. No suspicious lytic or blastic osseous  lesions. No acute displaced fracture. Multilevel degenerative changes of the spine. Review of the MIP images confirms the above findings. IMPRESSION: 1. No pulmonary embolus. 2. No acute intrathoracic abnormality. Electronically Signed   By: Tish Frederickson M.D.   On: 09/08/2022 21:42   CT Head Wo Contrast  Result Date: 09/08/2022 CLINICAL DATA:  Headache, suspected allergic reaction, nausea EXAM: CT HEAD WITHOUT CONTRAST TECHNIQUE: Contiguous axial images were obtained from the base of the skull through the vertex without intravenous contrast. RADIATION DOSE REDUCTION: This exam was performed according to the departmental dose-optimization program which includes automated exposure control, adjustment of the mA and/or kV according to patient size and/or use of iterative reconstruction technique. COMPARISON:  06/24/2022 FINDINGS: Brain: No acute infarct or hemorrhage. Lateral ventricles and midline structures are unremarkable. No acute extra-axial fluid collections. No mass effect. Vascular: No hyperdense vessel or unexpected calcification. Skull: Normal. Negative for fracture or focal lesion. Sinuses/Orbits: No acute finding. Other: None. IMPRESSION: 1. No acute intracranial process. Electronically Signed   By: Sharlet Salina M.D.   On: 09/08/2022 17:08   DG Chest Port 1 View  Result Date: 09/08/2022 CLINICAL DATA:  Seizure, bitten on neck, suspected allergic reaction, given p.o. Benadryl and EpiPen, nausea EXAM: PORTABLE CHEST 1 VIEW COMPARISON:  Portable exam 1637 hours compared to 06/25/2022 FINDINGS: Upper normal size of cardiac silhouette with slight vascular congestion. Mediastinal contours normal. Lungs clear. No infiltrate, pleural effusion, or pneumothorax. IMPRESSION: No acute abnormalities. Electronically Signed   By: Ulyses Southward M.D.   On: 09/08/2022 17:04    Medications: Scheduled:  heparin  5,000 Units  Subcutaneous Q8H   levETIRAcetam  1,500 mg Oral BID   Continuous:  aztreonam Stopped  (09/09/22 0734)   levETIRAcetam     vancomycin 750 mg (09/09/22 0745)    Assessment: 51 year old female with a history of epilepsy, presenting with seizure recurrence due to anticonvulsant noncompliance - Neurological exams: - Exam on Tuesday revealed a sedated/postictal patient without evidence for ongoing seizure activity. No motor asymmetry noted.  - Exam today: Improved but with LUE and LLE weakness. Patient states that this has been occurring intermittently since her first seizure. Of note, MRI brain from March of 2022 showed  a remote hemorrhagic insult at the parasagittal right parietooccipital convexity.  - CT head: Normal - Labs:  - Admission labs: Na and Ca normal. LFTs normal. Mildly hypokalemic (3.1). BUN and Cr normal. WBC elevated at 20.3.  - Today's AM labs show continued leukocytosis at 23.6. Na 146. Hypokalemia resolved.  - Pharmacy noted on the day of admission that the patient has not refilled her Zonegran for several months, therefore her presentation is most likely due to withdrawal from what was effectively Keppra monotherapy. Zonegran is not being restarted.  - Compliance with Keppra: Patient is prescribed Keppra at 1500 mg BID but states that she only takes a half dose in the mornings due to side effect of somnolence.  - Vitamin B12 level is low by neurological standards. Will need to supplement.  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 Therefore, on average, she has been taking a total of about 2250 mg Keppra per day, except for the past few days during which she was  completely noncompliant. Will need to switch her to Keppra XR, taken nightly.    Recommendations: - Switching immediate-release Keppra to Keppra XR at 2250 mg QHS.  - Inpatient seizure precautions - Will need to be counseled regarding outpatient seizure precautions prior to discharge: Per [facility] statutes, patients with seizures are not allowed to drive until  they have been seizure-free for six months. Use caution when using heavy equipment or power tools. Avoid working on ladders or at heights. Take showers instead of baths. Ensure the water temperature is not too high on the home water heater. Do not go swimming alone. When caring for infants or small children, sit down when holding, feeding, or changing them to minimize risk of injury to the child in the event you have a seizure. Also, Maintain good sleep hygiene. Avoid alcohol. - EEG is pending - Vitamin B12 supplementation 2000 mcg po qd. Redraw level in 3 months.   Addendum: EEG: This study is within normal limits. No seizures or epileptiform discharges were seen throughout the recording.      LOS: 1 day    signed: Dr. Caryl Pina 09/09/2022  8:50 AM

## 2022-09-10 DIAGNOSIS — E1169 Type 2 diabetes mellitus with other specified complication: Secondary | ICD-10-CM

## 2022-09-10 LAB — URINE CULTURE: Culture: NO GROWTH

## 2022-09-10 LAB — CBC
HCT: 35.5 % — ABNORMAL LOW (ref 36.0–46.0)
Hemoglobin: 11.4 g/dL — ABNORMAL LOW (ref 12.0–15.0)
MCH: 25.9 pg — ABNORMAL LOW (ref 26.0–34.0)
MCHC: 32.1 g/dL (ref 30.0–36.0)
MCV: 80.5 fL (ref 80.0–100.0)
Platelets: 334 10*3/uL (ref 150–400)
RBC: 4.41 MIL/uL (ref 3.87–5.11)
RDW: 15.1 % (ref 11.5–15.5)
WBC: 22.6 10*3/uL — ABNORMAL HIGH (ref 4.0–10.5)
nRBC: 0 % (ref 0.0–0.2)

## 2022-09-10 LAB — BASIC METABOLIC PANEL
Anion gap: 7 (ref 5–15)
BUN: 14 mg/dL (ref 6–20)
CO2: 22 mmol/L (ref 22–32)
Calcium: 8.8 mg/dL — ABNORMAL LOW (ref 8.9–10.3)
Chloride: 115 mmol/L — ABNORMAL HIGH (ref 98–111)
Creatinine, Ser: 0.59 mg/dL (ref 0.44–1.00)
GFR, Estimated: >60 mL/min (ref >60)
Glucose, Bld: 103 mg/dL — ABNORMAL HIGH (ref 70–99)
Potassium: 3.5 mmol/L (ref 3.5–5.1)
Sodium: 144 mmol/L (ref 135–145)

## 2022-09-10 LAB — GLUCOSE, CAPILLARY
Glucose-Capillary: 119 mg/dL — ABNORMAL HIGH (ref 70–99)
Glucose-Capillary: 93 mg/dL (ref 70–99)
Glucose-Capillary: 129 mg/dL — ABNORMAL HIGH (ref 70–99)

## 2022-09-10 LAB — LYME DISEASE SEROLOGY W/REFLEX: Lyme Total Antibody EIA: NEGATIVE

## 2022-09-10 LAB — ANA W/REFLEX: Anti Nuclear Antibody (ANA): NEGATIVE

## 2022-09-10 MED ORDER — LEVETIRACETAM ER 750 MG PO TB24: 2250.0000 mg | ORAL_TABLET | Freq: Every day | ORAL | 0 refills | Status: DC

## 2022-09-10 NOTE — Progress Notes (Signed)
Subjective: Awake with daughter at bedside. Tolerated nighttime dosing of Keppra XR well.    Objective: Current vital signs: BP (!) 141/83 (BP Location: Left Arm)   Pulse 80   Temp 97.8 F (36.6 C) (Oral)   Resp 14   SpO2 98%  Vital signs in last 24 hours: Temp:  [97.5 F (36.4 C)-98.4 F (36.9 C)] 97.8 F (36.6 C) (11/16 1130) Pulse Rate:  [78-102] 80 (11/16 1130) Resp:  [14-20] 14 (11/16 1130) BP: (119-144)/(56-83) 141/83 (11/16 1130) SpO2:  [97 %-99 %] 98 % (11/16 1130)  Intake/Output from previous day: 11/15 0701 - 11/16 0700 In: 250 [IV Piggyback:250] Out: 0  Intake/Output this shift: Total I/O In: -  Out: 1250 [Urine:1250] Nutritional status:  Diet Order             Diet regular Room service appropriate? Yes; Fluid consistency: Thin  Diet effective now                  HEENT: [place]/AT Lungs: Respirations unlabored   Neurologic Exam: Ment: Awake and alert. Affect mildly dysthymic. Speech is fluent with intact comprehension. Oriented and able to answer all questions.  CN: EOMI, face symmetric, phonation intact.  Motor: Unremarkable Cerebellar: No ataxia noted Gait: Deferred  Lab Results: Results for orders placed or performed during the hospital encounter of 09/08/22 (from the past 48 hour(s))  CBC with Differential     Status: Abnormal   Collection Time: 09/08/22  3:39 PM  Result Value Ref Range   WBC 20.3 (H) 4.0 - 10.5 K/uL   RBC 4.95 3.87 - 5.11 MIL/uL   Hemoglobin 12.6 12.0 - 15.0 g/dL   HCT 16.1 09.6 - 04.5 %   MCV 80.8 80.0 - 100.0 fL   MCH 25.5 (L) 26.0 - 34.0 pg   MCHC 31.5 30.0 - 36.0 g/dL   RDW 40.9 81.1 - 91.4 %   Platelets 374 150 - 400 K/uL   nRBC 0.0 0.0 - 0.2 %   Neutrophils Relative % 50 %   Neutro Abs 10.2 (H) 1.7 - 7.7 K/uL   Lymphocytes Relative 43 %   Lymphs Abs 8.7 (H) 0.7 - 4.0 K/uL   Monocytes Relative 5 %   Monocytes Absolute 1.0 0.1 - 1.0 K/uL   Eosinophils Relative 2 %   Eosinophils Absolute 0.3 0.0 - 0.5 K/uL    Basophils Relative 0 %   Basophils Absolute 0.1 0.0 - 0.1 K/uL   WBC Morphology Abnormal lymphocytes present    RBC Morphology MORPHOLOGY UNREMARKABLE    Smear Review Normal platelet morphology    Immature Granulocytes 0 %   Abs Immature Granulocytes 0.08 (H) 0.00 - 0.07 K/uL    Comment: Performed at [facility], [address] [place], [place] [address]  Comprehensive metabolic panel     Status: Abnormal   Collection Time: 09/08/22  3:39 PM  Result Value Ref Range   Sodium 143 135 - 145 mmol/L   Potassium 3.1 (L) 3.5 - 5.1 mmol/L   Chloride 109 98 - 111 mmol/L   CO2 24 22 - 32 mmol/L   Glucose, Bld 149 (H) 70 - 99 mg/dL    Comment: Glucose reference range applies only to samples taken after fasting for at least 8 hours.   BUN 14 6 - 20 mg/dL   Creatinine, Ser 6.21 0.44 - 1.00 mg/dL   Calcium 9.4 8.9 - 30.8 mg/dL   Total Protein 7.6 6.5 - 8.1 g/dL   Albumin 4.0 3.5 -  5.0 g/dL   AST 26 15 - 41 U/L   ALT 23 0 - 44 U/L   Alkaline Phosphatase 82 38 - 126 U/L   Total Bilirubin 0.6 0.3 - 1.2 mg/dL   GFR, Estimated >16>60 >10>60 mL/min    Comment: (NOTE) Calculated using the CKD-EPI Creatinine Equation (2021)    Anion gap 10 5 - 15    Comment: Performed at [facility], [address] [place], [place] [address]  Pathologist smear review     Status: None   Collection Time: 09/08/22  3:39 PM  Result Value Ref Range   Path Review Blood smear is reviewed.     Comment: Leukocytosis, with normal differential, and minimally left shifted maturation and few atypical/reactive lymphocytes. No increase in circulating blasts. Normocytic erythrocytes without significant anisopoikilocytosis. Normal platelet count and morphology. The cause of the patient's leukocytosis is unclear from morphologic review alone. Given the patient's clinical history, this could likely be secondary to acute stress response or medication (epinephrine) administration. Clinical correlation is  recommended. Reviewed by Beryle QuantHeath M. Jones, M.D. Performed at [facility], [address] [place], [place] [address]   RPR     Status: None   Collection Time: 09/08/22  3:39 PM  Result Value Ref Range   RPR Ser Ql NON REACTIVE NON REACTIVE    Comment: Performed at [facility], [address] [place], [place] [address]  POC CBG, ED     Status: Abnormal   Collection Time: 09/08/22  4:34 PM  Result Value Ref Range   Glucose-Capillary 152 (H) 70 - 99 mg/dL    Comment: Glucose reference range applies only to samples taken after fasting for at least 8 hours.  Blood gas, arterial     Status: Abnormal   Collection Time: 09/08/22  6:55 PM  Result Value Ref Range   Delivery systems ROOM AIR    pH, Arterial 7.43 7.35 - 7.45   pCO2 arterial 37 32 - 48 mmHg   pO2, Arterial 81 (L) 83 - 108 mmHg   Bicarbonate 24.6 20.0 - 28.0 mmol/L   Acid-Base Excess 0.5 0.0 - 2.0 mmol/L   O2 Saturation 99.8 %   Patient temperature 37.0    Collection site LEFT BRACHIAL    Allens test (pass/fail) PASS PASS    Comment: Performed at [facility], [address] [place], [place] [address]  Troponin I (High Sensitivity)     Status: None   Collection Time: 09/08/22  6:55 PM  Result Value Ref Range   Troponin I (High Sensitivity) 11 <18 ng/L    Comment: (NOTE) Elevated high sensitivity troponin I (hsTnI) values and significant  changes across serial measurements may suggest ACS but many other  chronic and acute conditions are known to elevate hsTnI results.  Refer to the "Links" section for chest pain algorithms and additional  guidance. Performed at [facility], [address] [place], [place] [address]   Hemoglobin A1c     Status: Abnormal   Collection Time: 09/08/22  6:55 PM  Result Value Ref Range   Hgb A1c MFr Bld 6.0 (H) 4.8 - 5.6 %    Comment: (NOTE) Pre diabetes:          5.7%-6.4%  Diabetes:              >6.4%  Glycemic control for   <7.0% adults with  diabetes    Mean Plasma Glucose 125.5 mg/dL    Comment: Performed at  [facility], [address] [place], [place] [address]  CK     Status: None   Collection Time: 09/08/22  6:55 PM  Result Value Ref Range   Total CK 128 38 - 234 U/L    Comment: Performed at [facility], [address] [place], [place] [address]  Procalcitonin - Baseline     Status: None   Collection Time: 09/08/22  6:55 PM  Result Value Ref Range   Procalcitonin 0.21 ng/mL    Comment:        Interpretation: PCT (Procalcitonin) <= 0.5 ng/mL: Systemic infection (sepsis) is not likely. Local bacterial infection is possible. (NOTE)       Sepsis PCT Algorithm           Lower Respiratory Tract                                      Infection PCT Algorithm    ----------------------------     ----------------------------         PCT < 0.25 ng/mL                PCT < 0.10 ng/mL          Strongly encourage             Strongly discourage   discontinuation of antibiotics    initiation of antibiotics    ----------------------------     -----------------------------       PCT 0.25 - 0.50 ng/mL            PCT 0.10 - 0.25 ng/mL               OR       >80% decrease in PCT            Discourage initiation of                                            antibiotics      Encourage discontinuation           of antibiotics    ----------------------------     -----------------------------         PCT >= 0.50 ng/mL              PCT 0.26 - 0.50 ng/mL               AND        <80% decrease in PCT             Encourage initiation of                                             antibiotics       Encourage continuation           of antibiotics    ----------------------------     -----------------------------        PCT >= 0.50 ng/mL                  PCT > 0.50 ng/mL               AND         increase  in PCT                  Strongly encourage                                      initiation of antibiotics    Strongly  encourage escalation           of antibiotics                                     -----------------------------                                           PCT <= 0.25 ng/mL                                                 OR                                        > 80% decrease in PCT                                      Discontinue / Do not initiate                                             antibiotics  Performed at [facility], [address] [place], [place] [address]   Culture, blood (Routine X 2) w Reflex to ID Panel     Status: None (Preliminary result)   Collection Time: 09/08/22  6:55 PM   Specimen: BLOOD  Result Value Ref Range   Specimen Description BLOOD RIGHT ANTECUBITAL    Special Requests      BOTTLES DRAWN AEROBIC AND ANAEROBIC Blood Culture adequate volume   Culture      NO GROWTH 2 DAYS Performed at [facility], [address], [place], [place] [address]    Report Status PENDING   C-reactive protein     Status: None   Collection Time: 09/08/22  6:55 PM  Result Value Ref Range   CRP 0.5 <1.0 mg/dL    Comment: Performed at [facility], [address] [place], [place] [address]  Sedimentation rate     Status: None   Collection Time: 09/08/22  6:55 PM  Result Value Ref Range   Sed Rate 28 0 - 30 mm/hr    Comment: Performed at [facility], [address] [place], [place] [address]  Ethanol     Status: None   Collection Time: 09/08/22  6:55 PM  Result Value Ref Range   Alcohol, Ethyl (B) <10 <10 mg/dL    Comment: (NOTE) Lowest detectable limit for serum alcohol is 10 mg/dL.  For medical purposes only. Performed at [facility], [address] [place],  [place] [address]   Vitamin B12     Status: None   Collection Time: 09/08/22  6:55 PM  Result Value Ref Range   Vitamin B-12 188 180 - 914 pg/mL    Comment: (NOTE) This assay is not validated for testing neonatal or myeloproliferative syndrome specimens  for Vitamin B12 levels. Performed at [facility], [address] [place], [place] [address]   D-dimer, quantitative     Status: Abnormal   Collection Time: 09/08/22  7:05 PM  Result Value Ref Range   D-Dimer, Quant 0.70 (H) 0.00 - 0.50 ug/mL-FEU    Comment: (NOTE) At the manufacturer cut-off value of 0.5 g/mL FEU, this assay has a negative predictive value of 95-100%.This assay is intended for use in conjunction with a clinical pretest probability (PTP) assessment model to exclude pulmonary embolism (PE) and deep venous thrombosis (DVT) in outpatients suspected of PE or DVT. Results should be correlated with clinical presentation. Performed at [facility], [address] [place], [place] [address]   APTT     Status: Abnormal   Collection Time: 09/08/22  7:05 PM  Result Value Ref Range   aPTT 23 (L) 24 - 36 seconds    Comment: Performed at [facility], [address] [place], [place] [address]  Lactic acid, plasma     Status: Abnormal   Collection Time: 09/08/22  7:06 PM  Result Value Ref Range   Lactic Acid, Venous 2.1 (HH) 0.5 - 1.9 mmol/L    Comment: CRITICAL RESULT CALLED TO, READ BACK BY AND VERIFIED WITH HENRY AUSTIN 09/08/22 2040 MU Performed at [facility], [address] [place], [place] [address]   Lactic acid, plasma     Status: None   Collection Time: 09/08/22  9:48 PM  Result Value Ref Range   Lactic Acid, Venous 1.7 0.5 - 1.9 mmol/L    Comment: Performed at [facility], [address] [place], [place] [address]  Lyme Disease Serology w/Reflex     Status: None   Collection Time: 09/08/22  9:48 PM  Result Value Ref Range   Lyme Total Antibody EIA Negative Negative    Comment: (NOTE) Lyme antibodies not detected. Reflex testing is not indicated. No laboratory evidence of infection with B. burgdorferi (Lyme disease). Negative results may occur in patients recently infected (less than or equal to 14 days)  with B. burgdorferi.  If recent infection is suspected, repeat testing on a new sample collected in 7 to 14 days is recommended. Performed At: [facility] [address] [place], [place] [id number] Jolene Schimke MD [id number]   ANA w/Reflex     Status: None   Collection Time: 09/08/22  9:48 PM  Result Value Ref Range   Anti Nuclear Antibody (ANA) Negative Negative    Comment: (NOTE) Performed At: [facility] [address] [place], [place] [id number] Jolene Schimke MD [id number]   Troponin I (High Sensitivity)     Status: None   Collection Time: 09/08/22  9:48 PM  Result Value Ref Range   Troponin I (High Sensitivity) 9 <18 ng/L    Comment: (NOTE) Elevated high sensitivity troponin I (hsTnI) values and significant  changes across serial measurements may suggest ACS but many other  chronic and acute conditions are known to elevate hsTnI results.  Refer to the "Links" section for chest pain algorithms and additional  guidance. Performed at [facility], [address] [place], [place] [address]   Urine Culture  Status: None   Collection Time: 09/08/22  9:56 PM   Specimen: Urine, Random  Result Value Ref Range   Specimen Description      URINE, RANDOM Performed at [facility], [address] [place], [place] [address]    Special Requests      NONE Performed at [facility], [address] [place], [place] [address]    Culture      NO GROWTH Performed at [facility], [address], [place], [place] [address]    Report Status 09/10/2022 FINAL   Urinalysis, Complete w Microscopic Urine, Clean Catch     Status: Abnormal   Collection Time: 09/08/22  9:56 PM  Result Value Ref Range   Color, Urine STRAW (A) YELLOW   APPearance CLEAR (A) CLEAR   Specific Gravity, Urine 1.006 1.005 - 1.030   pH 6.0 5.0 - 8.0   Glucose, UA NEGATIVE NEGATIVE mg/dL   Hgb urine dipstick LARGE (A) NEGATIVE   Bilirubin Urine  NEGATIVE NEGATIVE   Ketones, ur NEGATIVE NEGATIVE mg/dL   Protein, ur NEGATIVE NEGATIVE mg/dL   Nitrite NEGATIVE NEGATIVE   Leukocytes,Ua NEGATIVE NEGATIVE   RBC / HPF 11-20 0 - 5 RBC/hpf   WBC, UA 0-5 0 - 5 WBC/hpf   Bacteria, UA RARE (A) NONE SEEN   Squamous Epithelial / LPF 0-5 0 - 5   Mucus PRESENT     Comment: Performed at [facility], [address] [place], [place] [address]  CBG monitoring, ED     Status: Abnormal   Collection Time: 09/09/22  2:43 AM  Result Value Ref Range   Glucose-Capillary 147 (H) 70 - 99 mg/dL    Comment: Glucose reference range applies only to samples taken after fasting for at least 8 hours.  CBC with Differential/Platelet     Status: Abnormal   Collection Time: 09/09/22  6:58 AM  Result Value Ref Range   WBC 23.6 (H) 4.0 - 10.5 K/uL   RBC 4.52 3.87 - 5.11 MIL/uL   Hemoglobin 11.5 (L) 12.0 - 15.0 g/dL   HCT 76.1 60.7 - 37.1 %   MCV 79.9 (L) 80.0 - 100.0 fL   MCH 25.4 (L) 26.0 - 34.0 pg   MCHC 31.9 30.0 - 36.0 g/dL   RDW 06.2 69.4 - 85.4 %   Platelets 336 150 - 400 K/uL   nRBC 0.0 0.0 - 0.2 %   Neutrophils Relative % 84 %   Neutro Abs 19.9 (H) 1.7 - 7.7 K/uL   Lymphocytes Relative 12 %   Lymphs Abs 2.8 0.7 - 4.0 K/uL   Monocytes Relative 3 %   Monocytes Absolute 0.6 0.1 - 1.0 K/uL   Eosinophils Relative 0 %   Eosinophils Absolute 0.0 0.0 - 0.5 K/uL   Basophils Relative 0 %   Basophils Absolute 0.0 0.0 - 0.1 K/uL   Immature Granulocytes 1 %   Abs Immature Granulocytes 0.14 (H) 0.00 - 0.07 K/uL    Comment: Performed at [facility], [address] [place], [place] [address]  Comprehensive metabolic panel     Status: Abnormal   Collection Time: 09/09/22  6:58 AM  Result Value Ref Range   Sodium 146 (H) 135 - 145 mmol/L   Potassium 3.5 3.5 - 5.1 mmol/L   Chloride 116 (H) 98 - 111 mmol/L   CO2 21 (L) 22 - 32 mmol/L   Glucose, Bld 127 (H) 70 - 99 mg/dL    Comment: Glucose reference  range applies only to samples taken  after fasting for at least 8 hours.   BUN 14 6 - 20 mg/dL   Creatinine, Ser 4.13 0.44 - 1.00 mg/dL   Calcium 9.4 8.9 - 24.4 mg/dL   Total Protein 6.7 6.5 - 8.1 g/dL   Albumin 3.4 (L) 3.5 - 5.0 g/dL   AST 19 15 - 41 U/L   ALT 18 0 - 44 U/L   Alkaline Phosphatase 78 38 - 126 U/L   Total Bilirubin 0.4 0.3 - 1.2 mg/dL   GFR, Estimated >01 >02 mL/min    Comment: (NOTE) Calculated using the CKD-EPI Creatinine Equation (2021)    Anion gap 9 5 - 15    Comment: Performed at [facility], [address] [place], [place] [address]  Magnesium     Status: None   Collection Time: 09/09/22  6:58 AM  Result Value Ref Range   Magnesium 1.9 1.7 - 2.4 mg/dL    Comment: Performed at [facility], [address], [place], [place] [address]  Phosphorus     Status: None   Collection Time: 09/09/22  6:58 AM  Result Value Ref Range   Phosphorus 3.4 2.5 - 4.6 mg/dL    Comment: Performed at [facility], [address] [place], [place] [address]  CBG monitoring, ED     Status: Abnormal   Collection Time: 09/09/22 10:13 AM  Result Value Ref Range   Glucose-Capillary 224 (H) 70 - 99 mg/dL    Comment: Glucose reference range applies only to samples taken after fasting for at least 8 hours.   Comment 1 Call MD NNP PA CNM   CBG monitoring, ED     Status: Abnormal   Collection Time: 09/09/22 12:10 PM  Result Value Ref Range   Glucose-Capillary 170 (H) 70 - 99 mg/dL    Comment: Glucose reference range applies only to samples taken after fasting for at least 8 hours.  Glucose, capillary     Status: Abnormal   Collection Time: 09/09/22  8:31 PM  Result Value Ref Range   Glucose-Capillary 194 (H) 70 - 99 mg/dL    Comment: Glucose reference range applies only to samples taken after fasting for at least 8 hours.  Glucose, capillary     Status: Abnormal   Collection Time: 09/10/22  4:55 AM  Result Value Ref Range   Glucose-Capillary 119 (H) 70 - 99 mg/dL    Comment: Glucose  reference range applies only to samples taken after fasting for at least 8 hours.  CBC     Status: Abnormal   Collection Time: 09/10/22  5:12 AM  Result Value Ref Range   WBC 22.6 (H) 4.0 - 10.5 K/uL   RBC 4.41 3.87 - 5.11 MIL/uL   Hemoglobin 11.4 (L) 12.0 - 15.0 g/dL   HCT 95.6 (L) 38.7 - 56.4 %   MCV 80.5 80.0 - 100.0 fL   MCH 25.9 (L) 26.0 - 34.0 pg   MCHC 32.1 30.0 - 36.0 g/dL   RDW 33.2 95.1 - 88.4 %   Platelets 334 150 - 400 K/uL   nRBC 0.0 0.0 - 0.2 %    Comment: Performed at [facility], [address] [place], [place] [address]  Basic metabolic panel     Status: Abnormal   Collection Time: 09/10/22  5:12 AM  Result Value Ref Range   Sodium 144 135 - 145 mmol/L   Potassium 3.5 3.5 - 5.1 mmol/L   Chloride 115 (  H) 98 - 111 mmol/L   CO2 22 22 - 32 mmol/L   Glucose, Bld 103 (H) 70 - 99 mg/dL    Comment: Glucose reference range applies only to samples taken after fasting for at least 8 hours.   BUN 14 6 - 20 mg/dL   Creatinine, Ser 1.61 0.44 - 1.00 mg/dL   Calcium 8.8 (L) 8.9 - 10.3 mg/dL   GFR, Estimated >09 >60 mL/min    Comment: (NOTE) Calculated using the CKD-EPI Creatinine Equation (2021)    Anion gap 7 5 - 15    Comment: Performed at [facility], [address] [place], [place] [address]  Glucose, capillary     Status: None   Collection Time: 09/10/22  7:22 AM  Result Value Ref Range   Glucose-Capillary 93 70 - 99 mg/dL    Comment: Glucose reference range applies only to samples taken after fasting for at least 8 hours.  Glucose, capillary     Status: Abnormal   Collection Time: 09/10/22 11:31 AM  Result Value Ref Range   Glucose-Capillary 129 (H) 70 - 99 mg/dL    Comment: Glucose reference range applies only to samples taken after fasting for at least 8 hours.    Recent Results (from the past 240 hour(s))  Culture, blood (Routine X 2) w Reflex to ID Panel     Status: None (Preliminary result)   Collection Time: 09/08/22  6:55 PM    Specimen: BLOOD  Result Value Ref Range Status   Specimen Description BLOOD RIGHT ANTECUBITAL  Final   Special Requests   Final    BOTTLES DRAWN AEROBIC AND ANAEROBIC Blood Culture adequate volume   Culture   Final    NO GROWTH 2 DAYS Performed at [facility], [address], [place], [place] [address]    Report Status PENDING  Incomplete  Urine Culture     Status: None   Collection Time: 09/08/22  9:56 PM   Specimen: Urine, Random  Result Value Ref Range Status   Specimen Description   Final    URINE, RANDOM Performed at [facility], [address] [place], [place] [address]    Special Requests   Final    NONE Performed at [facility], [address], [place], [place] [address]    Culture   Final    NO GROWTH Performed at [facility], [address], [place], [place] [address]    Report Status 09/10/2022 FINAL  Final    Lipid Panel No results for input(s): "CHOL", "TRIG", "HDL", "CHOLHDL", "VLDL", "LDLCALC" in the last 72 hours.  Studies/Results: EEG adult  Result Date: 09/09/2022 Charlsie Quest, MD     09/09/2022  1:19 PM Patient Name: AIGNER HORSEMAN MRN: [id number] Epilepsy Attending: Charlsie Quest Referring Physician/Provider: Caryl Pina, MD Date: 09/09/2022 Duration: 24.56 mins Patient history: 51 year old female with a history of epilepsy, presenting with seizure recurrence due to anticonvulsant noncompliance. EEG to evaluate for seizure. Level of alertness: Awake, asleep AEDs during EEG study: LEV Technical aspects: This EEG study was done with scalp electrodes positioned according to the 10-20 International system of electrode placement. Electrical activity was reviewed with band pass filter of 1-70Hz , sensitivity of 7 uV/mm, display speed of 28mm/sec with a  notched filter applied as appropriate. EEG data were recorded continuously and digitally stored.  Video monitoring was available and reviewed as appropriate.  Description: The posterior dominant rhythm consists of 10 Hz activity of  moderate voltage (25-35 uV) seen predominantly in posterior head regions, symmetric and reactive to eye opening and eye closing. Sleep was characterized by vertex waves, sleep spindles (12 to 14 Hz), maximal frontocentral region. Hyperventilation and photic stimulation were not performed.   IMPRESSION: This study is within normal limits. No seizures or epileptiform discharges were seen throughout the recording. A normal interictal EEG does not exclude the diagnosis of epilepsy. Charlsie Quest   CT Angio Chest Pulmonary Embolism (PE) W or WO Contrast  Result Date: 09/08/2022 CLINICAL DATA:  Pulmonary embolism (PE) suspected, high prob. Seizure-like activity. EXAM: CT ANGIOGRAPHY CHEST WITH CONTRAST TECHNIQUE: Multidetector CT imaging of the chest was performed using the standard protocol during bolus administration of intravenous contrast. Multiplanar CT image reconstructions and MIPs were obtained to evaluate the vascular anatomy. RADIATION DOSE REDUCTION: This exam was performed according to the departmental dose-optimization program which includes automated exposure control, adjustment of the mA and/or kV according to patient size and/or use of iterative reconstruction technique. CONTRAST:  75mL OMNIPAQUE IOHEXOL 350 MG/ML SOLN COMPARISON:  None Available. FINDINGS: Cardiovascular: Satisfactory opacification of the pulmonary arteries to the segmental level. No evidence of pulmonary embolism. Normal heart size. No significant pericardial effusion. The thoracic aorta is normal in caliber. No atherosclerotic plaque of the thoracic aorta. No coronary artery calcifications. Mediastinum/Nodes: No enlarged mediastinal, hilar, or axillary lymph nodes. Thyroid gland, trachea, and esophagus demonstrate no significant findings. Lungs/Pleura: Bilateral lower lobe subsegmental atelectasis. No focal consolidation. No pulmonary nodule. No pulmonary  mass. No pleural effusion. No pneumothorax. Upper Abdomen: No acute abnormality. Musculoskeletal: No chest wall abnormality. No suspicious lytic or blastic osseous lesions. No acute displaced fracture. Multilevel degenerative changes of the spine. Review of the MIP images confirms the above findings. IMPRESSION: 1. No pulmonary embolus. 2. No acute intrathoracic abnormality. Electronically Signed   By: Tish Frederickson M.D.   On: 09/08/2022 21:42   CT Head Wo Contrast  Result Date: 09/08/2022 CLINICAL DATA:  Headache, suspected allergic reaction, nausea EXAM: CT HEAD WITHOUT CONTRAST TECHNIQUE: Contiguous axial images were obtained from the base of the skull through the vertex without intravenous contrast. RADIATION DOSE REDUCTION: This exam was performed according to the departmental dose-optimization program which includes automated exposure control, adjustment of the mA and/or kV according to patient size and/or use of iterative reconstruction technique. COMPARISON:  06/24/2022 FINDINGS: Brain: No acute infarct or hemorrhage. Lateral ventricles and midline structures are unremarkable. No acute extra-axial fluid collections. No mass effect. Vascular: No hyperdense vessel or unexpected calcification. Skull: Normal. Negative for fracture or focal lesion. Sinuses/Orbits: No acute finding. Other: None. IMPRESSION: 1. No acute intracranial process. Electronically Signed   By: Sharlet Salina M.D.   On: 09/08/2022 17:08   DG Chest Port 1 View  Result Date: 09/08/2022 CLINICAL DATA:  Seizure, bitten on neck, suspected allergic reaction, given p.o. Benadryl and EpiPen, nausea EXAM: PORTABLE CHEST 1 VIEW COMPARISON:  Portable exam 1637 hours compared to 06/25/2022 FINDINGS: Upper normal size of cardiac silhouette with slight vascular congestion. Mediastinal contours normal. Lungs clear. No infiltrate, pleural effusion, or pneumothorax. IMPRESSION: No acute abnormalities. Electronically Signed   By: Ulyses Southward M.D.    On: 09/08/2022 17:04    Medications: Scheduled:  vitamin B-12  2,000 mcg Oral Daily   heparin  5,000 Units Subcutaneous Q8H   levETIRAcetam  2,250 mg Oral QHS   melatonin  5 mg Oral QHS   zonisamide  100 mg Oral q AM   zonisamide  200  mg Oral QPM   Assessment: 51 year old female with a history of epilepsy, presenting with seizure recurrence due to noncompliance with her home Keppra regimen.  - Exam today is normal.   - Pharmacy noted on the day of admission that the patient has not refilled her Zonegran for several months, therefore her presentation is most likely due to withdrawal from what was effectively Keppra monotherapy. Zonegran is not being restarted.  - EEG: This study is within normal limits. No seizures or epileptiform discharges were seen throughout the recording.  - Compliance with Keppra: Patient was prescribed Keppra at 1500 mg BID outpatient, but states that she only takes a half dose in the mornings due to side effect of somnolence.  - Vitamin B12 level is low by neurological standards. Will need to supplement.  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 Therefore, on average, she has been taking a total of about 2250 mg Keppra per day, except for the past few days during which she was completely noncompliant. She has been switched to Keppra XR 2250 mg taken nightly. Now off Zonegran.     Recommendations: - Continue new anticonvulsant regimen of Keppra XR 2250 mg QHS.  - Per [facility] statutes, patients with seizures are not allowed to drive until  they have been  seizure-free for six months. Use caution when using heavy equipment or power tools. Avoid working on ladders or at heights. Take showers instead of baths. Ensure the water temperature is not too high on the home water heater. Do not go swimming alone. When caring for infants or small children, sit down when holding, feeding, or changing them to minimize risk of injury to the child in the event you have a seizure. Also, Maintain good sleep hygiene. Avoid alcohol. - Continue vitamin B12 supplementation 2000 mcg po qd at discharge. Redraw level in 3 months.  - Outpatient Neurology follow up.  - Neurohospitalist service will sign off. Please call if there are additional questions.       LOS: 2 days    signed: Dr. Caryl Pina 09/10/2022  12:46 PM

## 2022-09-10 NOTE — Discharge Summary (Signed)
Physician Discharge Summary  [name] ZOX:[id number] DOB: [birth year] DOA: 09/08/2022  PCP: Corky Downs, MD  Admit date: 09/08/2022 Discharge date: 09/10/2022  Admitted From: home  Disposition:  home   Recommendations for Outpatient Follow-up:  Follow up with PCP in 1-2 weeks F/u w/ neuro in 1-2 weeks   Home Health: no  Equipment/Devices:  Discharge Condition: stable  CODE STATUS: full  Diet recommendation: Carb Modified   Brief/Interim Summary: HPI was taken from Dr. Irena Cords: [name] is an 51 y.o. female seen today for seizures at work. Per daughter at bedside pt was bitten at work by some insect and then developed sob and has seizures she was given epi pen at work by [employer] and again at ed.  Pt has been somnolent and nonverbal lethargic.  Pt has her most recent seizure in September after not having seizure since long time.   As per Dr. Mayford Knife 11/15-11/16/23: Pt presented after seizures. Pt often did not take her keppra as prescribed b/c it made her very tired/drowsy at work. CT head was neg. EEG was WNL, no seizure or epileptiform discharges were seen throughout recording. Keppra was changed to XR and 2,250 mg total by mouth at bedtime as per neuro. For more information, please see previous progress/consult notes.   Discharge Diagnoses:  Principal Problem:   Seizures (HCC) Active Problems:   Headache   Obesity (BMI 30.0-34.9)  Seizures: often did not take her keppra as prescribed. CT head was neg. EEG is WNL, no seizures or epileptiform discharges were seen throughout recording. Change keppra immediate release to keppra XR as per neuro   Sepsis r/o: d/c abxs. Will monitor   DM2: HbA1c 6.0, well controlled. Diet controlled    Obesity: would benefit from weight loss    Headache: w/ hx of migraines. Not on any meds for this as per med rec   Leukocytosis: etiology unclear, possibly reactive vs steroid use.  Urine cx shows no growth. Blood cxs NGTD. Trending  down slight   Discharge Instructions  Discharge Instructions     Diet general   Complete by: As directed    Discharge instructions   Complete by: As directed    F/u w/ PCP in 1-2 weeks. F/u w/ neuro in 1-2 weeks   Increase activity slowly   Complete by: As directed       Allergies as of 09/10/2022       Reactions   Amoxicillin Anaphylaxis   Fish Allergy Anaphylaxis   Morphine And Related Anaphylaxis   Penicillins Anaphylaxis   Has patient had a PCN reaction causing immediate rash, facial/tongue/throat swelling, SOB or lightheadedness with hypotension: Yes Has patient had a PCN reaction causing severe rash involving mucus membranes or skin necrosis: Yes Has patient had a PCN reaction that required hospitalization Yes Has patient had a PCN reaction occurring within the last 10 years: Yes If all of the above answers are "NO", then may proceed with Cephalosporin use.   Rocephin [ceftriaxone Sodium In Dextrose] Anaphylaxis   Shellfish Allergy Anaphylaxis, Itching   Tylenol [acetaminophen] Anaphylaxis   Topamax [topiramate] Nausea And Vomiting   Tramadol Other (See Comments)   Unknown per SO        Medication List     STOP taking these medications    levETIRAcetam 750 MG tablet Commonly known as: KEPPRA Replaced by: levETIRAcetam 750 MG 24 hr tablet   zonisamide 100 MG capsule Commonly known as: ZONEGRAN       TAKE these  medications    albuterol 108 (90 Base) MCG/ACT inhaler Commonly known as: VENTOLIN HFA Inhale 2 puffs into the lungs every 6 (six) hours as needed for wheezing or shortness of breath.   ibuprofen 200 MG tablet Commonly known as: ADVIL Take 1 tablet (200 mg total) by mouth as needed (pain).   levETIRAcetam 750 MG 24 hr tablet Commonly known as: KEPPRA XR Take 3 tablets (2,250 mg total) by mouth at bedtime. Replaces: levETIRAcetam 750 MG tablet   lisinopril 10 MG tablet Commonly known as: ZESTRIL Take 10 mg by mouth daily.         Allergies  Allergen Reactions   Amoxicillin Anaphylaxis   Fish Allergy Anaphylaxis   Morphine And Related Anaphylaxis   Penicillins Anaphylaxis    Has patient had a PCN reaction causing immediate rash, facial/tongue/throat swelling, SOB or lightheadedness with hypotension: Yes Has patient had a PCN reaction causing severe rash involving mucus membranes or skin necrosis: Yes Has patient had a PCN reaction that required hospitalization Yes Has patient had a PCN reaction occurring within the last 10 years: Yes If all of the above answers are "NO", then may proceed with Cephalosporin use.    Rocephin [Ceftriaxone Sodium In Dextrose] Anaphylaxis   Shellfish Allergy Anaphylaxis and Itching   Tylenol [Acetaminophen] Anaphylaxis   Topamax [Topiramate] Nausea And Vomiting   Tramadol Other (See Comments)    Unknown per SO    Consultations: Neuro    Procedures/Studies: EEG adult  Result Date: 17-Sep-2022 Charlsie Quest, MD     2022-09-17  1:19 PM Patient Name: [name] MRN: [id number] Epilepsy Attending: Charlsie Quest Referring Physician/Provider: Caryl Pina, MD Date: 17-Sep-2022 Duration: 24.56 mins Patient history: 51 year old female with a history of epilepsy, presenting with seizure recurrence due to anticonvulsant noncompliance. EEG to evaluate for seizure. Level of alertness: Awake, asleep AEDs during EEG study: LEV Technical aspects: This EEG study was done with scalp electrodes positioned according to the 10-20 International system of electrode placement. Electrical activity was reviewed with band pass filter of 1-70Hz , sensitivity of 7 uV/mm, display speed of 35mm/sec with a 60Hz  notched filter applied as appropriate. EEG data were recorded continuously and digitally stored.  Video monitoring was available and reviewed as appropriate. Description: The posterior dominant rhythm consists of 10 Hz activity of moderate voltage (25-35 uV) seen predominantly in posterior head  regions, symmetric and reactive to eye opening and eye closing. Sleep was characterized by vertex waves, sleep spindles (12 to 14 Hz), maximal frontocentral region. Hyperventilation and photic stimulation were not performed.   IMPRESSION: This study is within normal limits. No seizures or epileptiform discharges were seen throughout the recording. A normal interictal EEG does not exclude the diagnosis of epilepsy.   CT Angio Chest Pulmonary Embolism (PE) W or WO Contrast  Result Date: 09/08/2022 CLINICAL DATA:  Pulmonary embolism (PE) suspected, high prob. Seizure-like activity. EXAM: CT ANGIOGRAPHY CHEST WITH CONTRAST TECHNIQUE: Multidetector CT imaging of the chest was performed using the standard protocol during bolus administration of intravenous contrast. Multiplanar CT image reconstructions and MIPs were obtained to evaluate the vascular anatomy. RADIATION DOSE REDUCTION: This exam was performed according to the departmental dose-optimization program which includes automated exposure control, adjustment of the mA and/or kV according to patient size and/or use of iterative reconstruction technique. CONTRAST:  32mL OMNIPAQUE IOHEXOL 350 MG/ML SOLN COMPARISON:  None Available. FINDINGS: Cardiovascular: Satisfactory opacification of the pulmonary arteries to the segmental level. No evidence of  pulmonary embolism. Normal heart size. No significant pericardial effusion. The thoracic aorta is normal in caliber. No atherosclerotic plaque of the thoracic aorta. No coronary artery calcifications. Mediastinum/Nodes: No enlarged mediastinal, hilar, or axillary lymph nodes. Thyroid gland, trachea, and esophagus demonstrate no significant findings. Lungs/Pleura: Bilateral lower lobe subsegmental atelectasis. No focal consolidation. No pulmonary nodule. No pulmonary mass. No pleural effusion. No pneumothorax. Upper Abdomen: No acute abnormality. Musculoskeletal: No chest wall abnormality. No suspicious  lytic or blastic osseous lesions. No acute displaced fracture. Multilevel degenerative changes of the spine. Review of the MIP images confirms the above findings. IMPRESSION: 1. No pulmonary embolus. 2. No acute intrathoracic abnormality. Electronically Signed   By: Tish Frederickson M.D.   On: 09/08/2022 21:42   CT Head Wo Contrast  Result Date: 09/08/2022 CLINICAL DATA:  Headache, suspected allergic reaction, nausea EXAM: CT HEAD WITHOUT CONTRAST TECHNIQUE: Contiguous axial images were obtained from the base of the skull through the vertex without intravenous contrast. RADIATION DOSE REDUCTION: This exam was performed according to the departmental dose-optimization program which includes automated exposure control, adjustment of the mA and/or kV according to patient size and/or use of iterative reconstruction technique. COMPARISON:  06/24/2022 FINDINGS: Brain: No acute infarct or hemorrhage. Lateral ventricles and midline structures are unremarkable. No acute extra-axial fluid collections. No mass effect. Vascular: No hyperdense vessel or unexpected calcification. Skull: Normal. Negative for fracture or focal lesion. Sinuses/Orbits: No acute finding. Other: None. IMPRESSION: 1. No acute intracranial process. Electronically Signed   By: Sharlet Salina M.D.   On: 09/08/2022 17:08   DG Chest Port 1 View  Result Date: 09/08/2022 CLINICAL DATA:  Seizure, bitten on neck, suspected allergic reaction, given p.o. Benadryl and EpiPen, nausea EXAM: PORTABLE CHEST 1 VIEW COMPARISON:  Portable exam 1637 hours compared to 06/25/2022 FINDINGS: Upper normal size of cardiac silhouette with slight vascular congestion. Mediastinal contours normal. Lungs clear. No infiltrate, pleural effusion, or pneumothorax. IMPRESSION: No acute abnormalities. Electronically Signed   By: Ulyses Southward M.D.   On: 09/08/2022 17:04   (Echo, Carotid, EGD, Colonoscopy, ERCP)    Subjective: Pt denies any complaints    Discharge  Exam: Vitals:   09/10/22 0720 09/10/22 1130  BP: 125/72 (!) 141/83  Pulse: 78 80  Resp: 14 14  Temp: 98.2 F (36.8 C) 97.8 F (36.6 C)  SpO2: 99% 98%   Vitals:   09/09/22 2324 09/10/22 0454 09/10/22 0720 09/10/22 1130  BP: 120/64 127/77 125/72 (!) 141/83  Pulse: 90 80 78 80  Resp: 19 19 14 14   Temp: (!) 97.5 F (36.4 C) 97.8 F (36.6 C) 98.2 F (36.8 C) 97.8 F (36.6 C)  TempSrc:   Oral Oral  SpO2: 97% 99% 99% 98%    General: Pt is alert, awake, not in acute distress Cardiovascular:  S1/S2 +, no rubs, no gallops Respiratory: CTA bilaterally, no wheezing, no rhonchi Abdominal: Soft, NT, obese, bowel sounds + Extremities: no edema, no cyanosis    The results of significant diagnostics from this hospitalization (including imaging, microbiology, ancillary and laboratory) are listed below for reference.     Microbiology: Recent Results (from the past 240 hour(s))  Culture, blood (Routine X 2) w Reflex to ID Panel     Status: None (Preliminary result)   Collection Time: 09/08/22  6:55 PM   Specimen: BLOOD  Result Value Ref Range Status   Specimen Description BLOOD RIGHT ANTECUBITAL  Final   Special Requests   Final    BOTTLES DRAWN AEROBIC AND ANAEROBIC  Blood Culture adequate volume   Culture   Final    NO GROWTH 2 DAYS Performed at [facility], [address] [place], [place] [address]    Report Status PENDING  Incomplete  Urine Culture     Status: None   Collection Time: 09/08/22  9:56 PM   Specimen: Urine, Random  Result Value Ref Range Status   Specimen Description   Final    URINE, RANDOM Performed at [facility], [address] [place], [place] [address]    Special Requests   Final    NONE Performed at [facility], [address] [place], [place] [address]    Culture   Final    NO GROWTH Performed at [facility], [address] [place], [place] [address]    Report Status 09/10/2022 FINAL  Final      Labs: BNP (last 3 results) No results for input(s): "BNP" in the last 8760 hours. Basic Metabolic Panel: Recent Labs  Lab 09/08/22 1539 09/09/22 0658 09/10/22 0512  NA 143 146* 144  K 3.1* 3.5 3.5  CL 109 116* 115*  CO2 24 21* 22  GLUCOSE 149* 127* 103*  BUN 14 14 14   CREATININE 0.78 0.54 0.59  CALCIUM 9.4 9.4 8.8*  MG  --  1.9  --   PHOS  --  3.4  --    Liver Function Tests: Recent Labs  Lab 09/08/22 1539 09/09/22 0658  AST 26 19  ALT 23 18  ALKPHOS 82 78  BILITOT 0.6 0.4  PROT 7.6 6.7  ALBUMIN 4.0 3.4*   No results for input(s): "LIPASE", "AMYLASE" in the last 168 hours. No results for input(s): "AMMONIA" in the last 168 hours. CBC: Recent Labs  Lab 09/08/22 1539 09/09/22 0658 09/10/22 0512  WBC 20.3* 23.6* 22.6*  NEUTROABS 10.2* 19.9*  --   HGB 12.6 11.5* 11.4*  HCT 40.0 36.1 35.5*  MCV 80.8 79.9* 80.5  PLT 374 336 334   Cardiac Enzymes: Recent Labs  Lab 09/08/22 1855  CKTOTAL 128   BNP: Invalid input(s): "POCBNP" CBG: Recent Labs  Lab 09/09/22 1210 09/09/22 2031 09/10/22 0455 09/10/22 0722 09/10/22 1131  GLUCAP 170* 194* 119* 93 129*   D-Dimer Recent Labs    09/08/22 1905  DDIMER 0.70*   Hgb A1c Recent Labs    09/08/22 1855  HGBA1C 6.0*   Lipid Profile No results for input(s): "CHOL", "HDL", "LDLCALC", "TRIG", "CHOLHDL", "LDLDIRECT" in the last 72 hours. Thyroid function studies No results for input(s): "TSH", "T4TOTAL", "T3FREE", "THYROIDAB" in the last 72 hours.  Invalid input(s): "FREET3" Anemia work up Recent Labs    09/08/22 1855  VITAMINB12 188   Urinalysis    Component Value Date/Time   COLORURINE STRAW (A) 09/08/2022 2156   APPEARANCEUR CLEAR (A) 09/08/2022 2156   APPEARANCEUR Clear 08/29/2014 1556   LABSPEC 1.006 09/08/2022 2156   LABSPEC 1.012 08/29/2014 1556   PHURINE 6.0 09/08/2022 2156   GLUCOSEU NEGATIVE 09/08/2022 2156   GLUCOSEU Negative 08/29/2014 1556   HGBUR LARGE (A) 09/08/2022 2156    BILIRUBINUR NEGATIVE 09/08/2022 2156   BILIRUBINUR Negative 08/29/2014 1556   KETONESUR NEGATIVE 09/08/2022 2156   PROTEINUR NEGATIVE 09/08/2022 2156   UROBILINOGEN 1.0 03/18/2008 1923   NITRITE NEGATIVE 09/08/2022 2156   LEUKOCYTESUR NEGATIVE 09/08/2022 2156   LEUKOCYTESUR Negative 08/29/2014 1556   Sepsis Labs Recent Labs  Lab 09/08/22 1539 09/09/22 0658 09/10/22 0512  WBC 20.3* 23.6* 22.6*   Microbiology Recent Results (  from the past 240 hour(s))  Culture, blood (Routine X 2) w Reflex to ID Panel     Status: None (Preliminary result)   Collection Time: 09/08/22  6:55 PM   Specimen: BLOOD  Result Value Ref Range Status   Specimen Description BLOOD RIGHT ANTECUBITAL  Final   Special Requests   Final    BOTTLES DRAWN AEROBIC AND ANAEROBIC Blood Culture adequate volume   Culture   Final    NO GROWTH 2 DAYS Performed at [facility], [address], [place], [place] [address]    Report Status PENDING  Incomplete  Urine Culture     Status: None   Collection Time: 09/08/22  9:56 PM   Specimen: Urine, Random  Result Value Ref Range Status   Specimen Description   Final    URINE, RANDOM Performed at [facility], [address], [place], [place] [address]    Special Requests   Final    NONE Performed at [facility], [address] [place], [place] [address]    Culture   Final    NO GROWTH Performed at [facility], [address] [place], [place] [address]    Report Status 09/10/2022 FINAL  Final     Time coordinating discharge: Over 30 minutes  SIGNED:   Charise Killian, MD  [facility] 09/10/2022, 1:09 PM Pager   If 7PM-7AM, please contact night-coverage [email or web address]

## 2022-09-10 NOTE — Progress Notes (Signed)
Nutrition Brief Note  Patient identified on the Malnutrition Screening Tool (MST) Report  Wt Readings from Last 15 Encounters:  09/01/22 95.3 kg  07/27/22 95.9 kg  07/13/22 96.8 kg  06/24/22 101.2 kg  06/24/22 100 kg  02/11/22 93.9 kg  02/05/21 93.9 kg  01/27/21 94.5 kg  12/06/20 95.9 kg  09/06/20 97.8 kg  08/09/20 96.6 kg  07/10/20 97.5 kg  06/12/20 98.4 kg  06/05/20 98.9 kg  06/02/20 102.1 kg   Pt with a PMHx of epilepsy (on Keppra 1500 mg BID and Zonegran 100 mg QAM/200 mg QHS  at home), brain aneurysm, hemiplegic migraine, prior stroke with residual left hemiparesis and HTN who presents after having had a suspected allergic reaction at work due to having been "bitten on the neck"   Pt admitted with seizures.   Reviewed I/O's: +250 ml x 24 hours and +2.2 L since admission  UOP: 1.3 L x 24 hours  Spoke with pt at bedside, who reports feeling better today. She ate chick fil a brought in her daughter for breakfast. Pt shares that she has was hospitalized in August and has had a decreased appetite since then. She consumes 1 meal per day, which consists of salad and fast food. Pt also admits to drinking a lot of water.   Pt reports she has lost about 15 pounds since August. Wt has been stable over the past month.   Nutrition-Focused physical exam completed. Findings are no fat depletion, no muscle depletion, and no edema.    Pt is very anxious to go home today.  Medications reviewed and include keppra, vitamin B-12 and melatonin.  Current diet order is regular, patient is consuming approximately 100% of meals at this time. Labs and medications reviewed.   No nutrition interventions warranted at this time. If nutrition issues arise, please consult RD.   Levada Schilling, RD, LDN, CDCES Registered Dietitian II Certified Diabetes Care and Education Specialist Please refer to [facility] for RD and/or RD on-call/weekend/after hours pager

## 2022-09-10 NOTE — Progress Notes (Signed)
Discharge instructions explained to pt/ verbalized an understanding/ ivs and tele removed/ transported off unit via wheelchair.  

## 2022-09-11 ENCOUNTER — Telehealth: Payer: Self-pay

## 2022-09-11 NOTE — Telephone Encounter (Signed)
Transition Care Management Unsuccessful Follow-up Telephone Call  Date of discharge and from where:  [place] 09/10/2022  Attempts:  1st Attempt  Reason for unsuccessful TCM follow-up call:  Left voice message Karena Addison, LPN [facility] Nurse Health Advisor Direct Dial [phone]

## 2022-09-13 LAB — CULTURE, BLOOD (ROUTINE X 2)
Culture: NO GROWTH
Special Requests: ADEQUATE

## 2022-09-14 ENCOUNTER — Ambulatory Visit: Payer: Commercial Managed Care - PPO

## 2022-09-14 NOTE — Telephone Encounter (Signed)
Transition Care Management Unsuccessful Follow-up Telephone Call  Date of discharge and from where:  [place] 09/10/2022  Attempts:  2nd Attempt  Reason for unsuccessful TCM follow-up call:  Left voice message Karena Addison, LPN [facility] Nurse Health Advisor Direct Dial [phone]

## 2022-09-15 NOTE — Telephone Encounter (Signed)
Transition Care Management Unsuccessful Follow-up Telephone Call  Date of discharge and from where:  [place] 09/10/2022  Attempts:  3rd Attempt  Reason for unsuccessful TCM follow-up call:  Left voice message Karena Addison, LPN [facility] Nurse Health Advisor Direct Dial [phone]

## 2022-09-16 ENCOUNTER — Encounter: Payer: Commercial Managed Care - PPO | Admitting: Occupational Therapy

## 2022-09-16 ENCOUNTER — Ambulatory Visit: Payer: Commercial Managed Care - PPO

## 2022-09-21 ENCOUNTER — Ambulatory Visit: Payer: Commercial Managed Care - PPO

## 2022-09-21 ENCOUNTER — Ambulatory Visit (INDEPENDENT_AMBULATORY_CARE_PROVIDER_SITE_OTHER): Payer: Commercial Managed Care - PPO | Admitting: Internal Medicine

## 2022-09-21 ENCOUNTER — Encounter: Payer: Self-pay | Admitting: Internal Medicine

## 2022-09-21 VITALS — BP 118/70 | HR 86 | Temp 97.8°F | Ht 64.5 in | Wt 209.8 lb

## 2022-09-21 DIAGNOSIS — I611 Nontraumatic intracerebral hemorrhage in hemisphere, cortical: Secondary | ICD-10-CM

## 2022-09-21 DIAGNOSIS — R569 Unspecified convulsions: Secondary | ICD-10-CM | POA: Diagnosis not present

## 2022-09-21 DIAGNOSIS — I69359 Hemiplegia and hemiparesis following cerebral infarction affecting unspecified side: Secondary | ICD-10-CM

## 2022-09-21 DIAGNOSIS — I119 Hypertensive heart disease without heart failure: Secondary | ICD-10-CM | POA: Diagnosis not present

## 2022-09-21 DIAGNOSIS — E669 Obesity, unspecified: Secondary | ICD-10-CM

## 2022-09-21 DIAGNOSIS — G43419 Hemiplegic migraine, intractable, without status migrainosus: Secondary | ICD-10-CM | POA: Diagnosis not present

## 2022-09-21 LAB — CBC WITH DIFFERENTIAL/PLATELET
Absolute Monocytes: 896 cells/uL (ref 200–950)
Basophils Absolute: 96 cells/uL (ref 0–200)
Basophils Relative: 0.6 %
Eosinophils Absolute: 336 cells/uL (ref 15–500)
Eosinophils Relative: 2.1 %
HCT: 39.6 % (ref 35.0–45.0)
Hemoglobin: 13.1 g/dL (ref 11.7–15.5)
Lymphs Abs: 5600 cells/uL — ABNORMAL HIGH (ref 850–3900)
MCH: 25.9 pg — ABNORMAL LOW (ref 27.0–33.0)
MCHC: 33.1 g/dL (ref 32.0–36.0)
MCV: 78.4 fL — ABNORMAL LOW (ref 80.0–100.0)
MPV: 10.7 fL (ref 7.5–12.5)
Monocytes Relative: 5.6 %
Neutro Abs: 9072 cells/uL — ABNORMAL HIGH (ref 1500–7800)
Neutrophils Relative %: 56.7 %
Platelets: 330 10*3/uL (ref 140–400)
RBC: 5.05 10*6/uL (ref 3.80–5.10)
RDW: 14.3 % (ref 11.0–15.0)
Total Lymphocyte: 35 %
WBC: 16 10*3/uL — ABNORMAL HIGH (ref 3.8–10.8)

## 2022-09-21 MED ORDER — LISINOPRIL 10 MG PO TABS: 10.0000 mg | ORAL_TABLET | Freq: Every day | ORAL | 1 refills | Status: DC

## 2022-09-21 NOTE — Assessment & Plan Note (Signed)
Stable

## 2022-09-21 NOTE — Assessment & Plan Note (Signed)

## 2022-09-21 NOTE — Assessment & Plan Note (Signed)

## 2022-09-21 NOTE — Assessment & Plan Note (Signed)
Patient Keppra was increased to 750 mg 3 capsules at night daily

## 2022-09-21 NOTE — Progress Notes (Signed)
Established Patient Office Visit  Subjective:  Patient ID: [name], female    DOB: 1971/03/29  Age: 51 y.o. MRN: [id number]  CC:  Chief Complaint  Patient presents with   Migraine    1 month FU, Got bit by something on her neck so she was sent to ER, ended up having a seizure.    Migraine  Associated symptoms include coughing, seizures and weakness.    [name] presents for seizure  Past Medical History:  Diagnosis Date   Acute respiratory failure (HCC)    Brain aneurysm    Common migraine with intractable migraine 03/03/2017   Headache    Hemiplegic migraine 10/07/2015   Late effects of cerebral ischemic stroke 02/09/2018   Left hemiparesis   Primary hypertension 12/06/2020   Seizures (HCC)     Past Surgical History:  Procedure Laterality Date   ABDOMINAL HYSTERECTOMY     COLONOSCOPY WITH PROPOFOL N/A 09/01/2022   Procedure: COLONOSCOPY WITH PROPOFOL;  Surgeon: Wyline Mood, MD;  Location: [facility] ENDOSCOPY;  Service: Gastroenterology;  Laterality: N/A;    Family History  Problem Relation Age of Onset   Stroke Mother    Hypertension Father    Diabetes Father    Migraines Sister    Migraines Brother    Migraines Sister    Migraines Sister    Migraines Sister    Migraines Sister    Migraines Sister    Hypertension Paternal Aunt    Diabetes Paternal Aunt    Stroke Paternal Aunt    Hypertension Paternal Uncle    Diabetes Paternal Uncle    Stroke Paternal Uncle    Hypertension Maternal Grandmother    Aneurysm Paternal Grandmother    Diabetes Paternal Grandmother    Hypertension Paternal Grandfather    Cancer Paternal Grandfather    Seizures Cousin     Social History   Socioeconomic History   Marital status: Married    Spouse name: Not on file   Number of children: 3   Years of education: 11   Highest education level: Not on file  Occupational History   Occupation: Designer, television/film set  Tobacco Use   Smoking status: Every Day     Packs/day: 0.50    Types: Cigarettes   Smokeless tobacco: Never  Substance and Sexual Activity   Alcohol use: Yes    Alcohol/week: 0.0 standard drinks of alcohol    Comment: occasionally   Drug use: No   Sexual activity: Not on file  Other Topics Concern   Not on file  Social History Narrative   Patient drink about 2 cups of caffeine daily.   Patient is right handed.    Social Determinants of Health   Financial Resource Strain: Not on file  Food Insecurity: Not on file  Transportation Needs: Not on file  Physical Activity: Not on file  Stress: Not on file  Social Connections: Not on file  Intimate Partner Violence: Not on file     Current Outpatient Medications:    albuterol (VENTOLIN HFA) 108 (90 Base) MCG/ACT inhaler, Inhale 2 puffs into the lungs every 6 (six) hours as needed for wheezing or shortness of breath., Disp: 3 each, Rfl: 3   ibuprofen (ADVIL) 200 MG tablet, Take 1 tablet (200 mg total) by mouth as needed (pain)., Disp: 90 tablet, Rfl: 3   levETIRAcetam (KEPPRA XR) 750 MG 24 hr tablet, Take 3 tablets (2,250 mg total) by mouth at bedtime., Disp: 90 tablet, Rfl: 0   lisinopril (  ZESTRIL) 10 MG tablet, Take 1 tablet (10 mg total) by mouth daily., Disp: 90 tablet, Rfl: 1   Allergies  Allergen Reactions   Amoxicillin Anaphylaxis   Fish Allergy Anaphylaxis   Morphine And Related Anaphylaxis   Penicillins Anaphylaxis    Has patient had a PCN reaction causing immediate rash, facial/tongue/throat swelling, SOB or lightheadedness with hypotension: Yes Has patient had a PCN reaction causing severe rash involving mucus membranes or skin necrosis: Yes Has patient had a PCN reaction that required hospitalization Yes Has patient had a PCN reaction occurring within the last 10 years: Yes If all of the above answers are "NO", then may proceed with Cephalosporin use.    Rocephin [Ceftriaxone Sodium In Dextrose] Anaphylaxis   Shellfish Allergy Anaphylaxis and Itching   Tylenol  [Acetaminophen] Anaphylaxis   Topamax [Topiramate] Nausea And Vomiting   Tramadol Other (See Comments)    Unknown per SO    ROS Review of Systems  Constitutional: Negative.   HENT: Negative.    Eyes: Negative.   Respiratory:  Positive for cough and shortness of breath.   Cardiovascular: Negative.  Negative for chest pain.  Gastrointestinal: Negative.  Negative for abdominal distention.  Endocrine: Negative.   Genitourinary: Negative.   Musculoskeletal: Negative.   Skin: Negative.   Allergic/Immunologic: Negative.   Neurological:  Positive for seizures, syncope, weakness and light-headedness. Negative for speech difficulty.  Hematological: Negative.   Psychiatric/Behavioral: Negative.  Negative for behavioral problems.   All other systems reviewed and are negative.     Objective:    Physical Exam Vitals reviewed.  Constitutional:      Appearance: Normal appearance.  HENT:     Mouth/Throat:     Mouth: Mucous membranes are moist.  Eyes:     Pupils: Pupils are equal, round, and reactive to light.  Neck:     Vascular: No carotid bruit.  Cardiovascular:     Rate and Rhythm: Normal rate and regular rhythm.     Pulses: Normal pulses.     Heart sounds: Normal heart sounds.  Pulmonary:     Effort: Pulmonary effort is normal.     Breath sounds: Normal breath sounds.  Abdominal:     General: Bowel sounds are normal.     Palpations: Abdomen is soft. There is no hepatomegaly, splenomegaly or mass.     Tenderness: There is no abdominal tenderness.     Hernia: No hernia is present.  Musculoskeletal:        General: No tenderness.     Cervical back: Neck supple.     Right lower leg: No edema.     Left lower leg: No edema.  Skin:    Findings: No rash.  Neurological:     Mental Status: She is alert and oriented to person, place, and time.     Motor: No weakness.     Comments: Weakness lt side  Psychiatric:        Mood and Affect: Mood and affect normal.        Behavior:  Behavior normal.     BP 118/70   Pulse 86   Temp 97.8 F (36.6 C) (Temporal)   Ht 5' 4.5" (1.638 m)   Wt 209 lb 12.8 oz (95.2 kg)   SpO2 96%   BMI 35.46 kg/m  Wt Readings from Last 3 Encounters:  09/21/22 209 lb 12.8 oz (95.2 kg)  09/01/22 210 lb (95.3 kg)  07/27/22 211 lb 8 oz (95.9 kg)     Health  Maintenance Due  Topic Date Due   COVID-19 Vaccine (1) Never done   Hepatitis C Screening  Never done   PAP SMEAR-Modifier  Never done   MAMMOGRAM  Never done    There are no preventive care reminders to display for this patient.  No results found for: "TSH" Lab Results  Component Value Date   WBC 22.6 (H) 09/10/2022   HGB 11.4 (L) 09/10/2022   HCT 35.5 (L) 09/10/2022   MCV 80.5 09/10/2022   PLT 334 09/10/2022   Lab Results  Component Value Date   NA 144 09/10/2022   K 3.5 09/10/2022   CO2 22 09/10/2022   GLUCOSE 103 (H) 09/10/2022   BUN 14 09/10/2022   CREATININE 0.59 09/10/2022   BILITOT 0.4 09/09/2022   ALKPHOS 78 09/09/2022   AST 19 09/09/2022   ALT 18 09/09/2022   PROT 6.7 09/09/2022   ALBUMIN 3.4 (L) 09/09/2022   CALCIUM 8.8 (L) 09/10/2022   ANIONGAP 7 09/10/2022   No results found for: "CHOL" No results found for: "HDL" No results found for: "LDLCALC" Lab Results  Component Value Date   TRIG 171 (H) 06/25/2022   No results found for: "CHOLHDL" Lab Results  Component Value Date   HGBA1C 6.0 (H) 09/08/2022      Assessment & Plan:   Problem List Items Addressed This Visit       Cardiovascular and Mediastinum   Hemiplegic migraine    Stable      Relevant Medications   lisinopril (ZESTRIL) 10 MG tablet   Hypertensive heart disease without heart failure - Primary     Patient denies any chest pain or shortness of breath there is no history of palpitation or paroxysmal nocturnal dyspnea   patient was advised to follow low-salt low-cholesterol diet    ideally I want to keep systolic blood pressure below 161130 mmHg, patient was asked to check  blood pressure one times a week and give me a report on that.  Patient will be follow-up in 3 months  or earlier as needed, patient will call me back for any change in the cardiovascular symptoms Patient was advised to buy a book from local bookstore concerning blood pressure and read several chapters  every day.  This will be supplemented by some of the material we will give him from the office.  Patient should also utilize other resources like YouTube and Internet to learn more about the blood pressure and the diet.      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  - I educated them on making healthy dietary choices including eating more fruits and vegetables and less fried foods. - I encouraged the patient to exercise more, and educated on the benefits of exercise including weight loss, diabetes prevention, and hypertension prevention.   Dietary counseling with a registered dietician  Referral to a weight management support group (e.g. Weight Watchers, Overeaters Anonymous)  If your BMI is greater than 29 or you have gained more than 15 pounds you should work on weight loss.  Attend a healthy cooking class  History of hemorrhagic stroke with residual hemiparesis (HCC)    Stable       Meds ordered this encounter  Medications   lisinopril (ZESTRIL) 10 MG tablet    Sig: Take 1 tablet (10 mg total) by mouth daily.    Dispense:  90 tablet    Refill:  1    Follow-up: No follow-ups on file.    Corky Downs, MD

## 2022-09-22 ENCOUNTER — Ambulatory Visit: Payer: Commercial Managed Care - PPO

## 2022-09-23 ENCOUNTER — Ambulatory Visit: Payer: Commercial Managed Care - PPO | Admitting: Physical Therapy

## 2022-09-28 ENCOUNTER — Ambulatory Visit: Payer: Commercial Managed Care - PPO

## 2022-09-30 ENCOUNTER — Ambulatory Visit: Payer: Commercial Managed Care - PPO

## 2022-10-05 ENCOUNTER — Ambulatory Visit: Payer: Commercial Managed Care - PPO

## 2022-10-06 ENCOUNTER — Encounter: Payer: Self-pay | Admitting: Diagnostic Neuroimaging

## 2022-10-06 ENCOUNTER — Encounter: Payer: Self-pay | Admitting: Internal Medicine

## 2022-10-06 ENCOUNTER — Ambulatory Visit (INDEPENDENT_AMBULATORY_CARE_PROVIDER_SITE_OTHER): Payer: Commercial Managed Care - PPO | Admitting: Internal Medicine

## 2022-10-06 ENCOUNTER — Ambulatory Visit: Payer: Commercial Managed Care - PPO | Admitting: Diagnostic Neuroimaging

## 2022-10-06 VITALS — BP 139/90 | HR 85 | Ht 65.0 in | Wt 209.0 lb

## 2022-10-06 VITALS — BP 128/78 | HR 82 | Ht 64.5 in | Wt 209.1 lb

## 2022-10-06 DIAGNOSIS — I119 Hypertensive heart disease without heart failure: Secondary | ICD-10-CM

## 2022-10-06 DIAGNOSIS — R569 Unspecified convulsions: Secondary | ICD-10-CM

## 2022-10-06 DIAGNOSIS — R7303 Prediabetes: Secondary | ICD-10-CM

## 2022-10-06 DIAGNOSIS — G43419 Hemiplegic migraine, intractable, without status migrainosus: Secondary | ICD-10-CM

## 2022-10-06 DIAGNOSIS — I611 Nontraumatic intracerebral hemorrhage in hemisphere, cortical: Secondary | ICD-10-CM

## 2022-10-06 DIAGNOSIS — G43E11 Chronic migraine with aura, intractable, with status migrainosus: Secondary | ICD-10-CM

## 2022-10-06 DIAGNOSIS — D72829 Elevated white blood cell count, unspecified: Secondary | ICD-10-CM

## 2022-10-06 DIAGNOSIS — E669 Obesity, unspecified: Secondary | ICD-10-CM

## 2022-10-06 MED ORDER — NURTEC 75 MG PO TBDP: 75.0000 mg | ORAL_TABLET | ORAL | 6 refills | Status: DC

## 2022-10-06 MED ORDER — LEVETIRACETAM ER 750 MG PO TB24: 2250.0000 mg | ORAL_TABLET | Freq: Every day | ORAL | 4 refills | Status: DC

## 2022-10-06 NOTE — Patient Instructions (Signed)
  SEIZURE DISORDER (due to prior intracerebral hemorrhage in 2009; last seizure 08/26/22)  - continue LEV XR 2250 at bedtime  - According to [place] law, you can not drive unless you are seizure / syncope free for at least 6 months and under physician's care.   - Please maintain precautions. Do not participate in activities where a loss of awareness could harm you or someone else. No swimming alone, no tub bathing, no hot tubs, no driving, no operating motorized vehicles (cars, ATVs, motocycles, etc), lawnmowers, power tools or firearms. No standing at heights, such as rooftops, ladders or stairs. Avoid hot objects such as stoves, heaters, open fires. Wear a helmet when riding a bicycle, scooter, skateboard, etc. and avoid areas of traffic. Set your water heater to 120 degrees or less.   MIGRAINE WITH AURA (chronic migraine; > 25 days per month) - start nurtec 75mg  every other day (failed ajovy, zonegran, botox, topiramate; cannot take triptan due to intracerebral hemorrhage)

## 2022-10-06 NOTE — Assessment & Plan Note (Signed)

## 2022-10-06 NOTE — Assessment & Plan Note (Signed)
Patient was advised to lose weight 

## 2022-10-06 NOTE — Progress Notes (Signed)
Established Patient Office Visit  Subjective:  Patient ID: [name], female    DOB: Jun 15, 1971  Age: 51 y.o. MRN: [id number]  CC:  Chief Complaint  Patient presents with   Follow-up    2 week fu, been doing ok.     HPI  [name] presents for general check up  Past Medical History:  Diagnosis Date   Acute respiratory failure (HCC)    Brain aneurysm    Common migraine with intractable migraine 03/03/2017   Headache    Hemiplegic migraine 10/07/2015   Late effects of cerebral ischemic stroke 02/09/2018   Left hemiparesis   Primary hypertension 12/06/2020   Seizures (HCC)     Past Surgical History:  Procedure Laterality Date   ABDOMINAL HYSTERECTOMY     COLONOSCOPY WITH PROPOFOL N/A 09/01/2022   Procedure: COLONOSCOPY WITH PROPOFOL;  Surgeon: Wyline Mood, MD;  Location: [facility] ENDOSCOPY;  Service: Gastroenterology;  Laterality: N/A;    Family History  Problem Relation Age of Onset   Stroke Mother    Hypertension Father    Diabetes Father    Migraines Sister    Migraines Brother    Migraines Sister    Migraines Sister    Migraines Sister    Migraines Sister    Migraines Sister    Hypertension Paternal Aunt    Diabetes Paternal Aunt    Stroke Paternal Aunt    Hypertension Paternal Uncle    Diabetes Paternal Uncle    Stroke Paternal Uncle    Hypertension Maternal Grandmother    Aneurysm Paternal Grandmother    Diabetes Paternal Grandmother    Hypertension Paternal Grandfather    Cancer Paternal Grandfather    Seizures Cousin     Social History   Socioeconomic History   Marital status: Married    Spouse name: Not on file   Number of children: 3   Years of education: 11   Highest education level: Not on file  Occupational History   Occupation: Designer, television/film set  Tobacco Use   Smoking status: Every Day    Packs/day: 0.50    Types: Cigarettes   Smokeless tobacco: Never  Substance and Sexual Activity   Alcohol use: Yes    Alcohol/week:  0.0 standard drinks of alcohol    Comment: occasionally   Drug use: No   Sexual activity: Not on file  Other Topics Concern   Not on file  Social History Narrative   Patient drink about 2 cups of caffeine daily.   Patient is right handed.    Social Determinants of Health   Financial Resource Strain: Not on file  Food Insecurity: Not on file  Transportation Needs: Not on file  Physical Activity: Not on file  Stress: Not on file  Social Connections: Not on file  Intimate Partner Violence: Not on file     Current Outpatient Medications:    albuterol (VENTOLIN HFA) 108 (90 Base) MCG/ACT inhaler, Inhale 2 puffs into the lungs every 6 (six) hours as needed for wheezing or shortness of breath., Disp: 3 each, Rfl: 3   ibuprofen (ADVIL) 200 MG tablet, Take 1 tablet (200 mg total) by mouth as needed (pain)., Disp: 90 tablet, Rfl: 3   levETIRAcetam (KEPPRA XR) 750 MG 24 hr tablet, Take 3 tablets (2,250 mg total) by mouth at bedtime., Disp: 90 tablet, Rfl: 0   lisinopril (ZESTRIL) 10 MG tablet, Take 1 tablet (10 mg total) by mouth daily., Disp: 90 tablet, Rfl: 1   Allergies  Allergen Reactions   Amoxicillin Anaphylaxis   Fish Allergy Anaphylaxis   Morphine And Related Anaphylaxis   Penicillins Anaphylaxis    Has patient had a PCN reaction causing immediate rash, facial/tongue/throat swelling, SOB or lightheadedness with hypotension: Yes Has patient had a PCN reaction causing severe rash involving mucus membranes or skin necrosis: Yes Has patient had a PCN reaction that required hospitalization Yes Has patient had a PCN reaction occurring within the last 10 years: Yes If all of the above answers are "NO", then may proceed with Cephalosporin use.    Rocephin [Ceftriaxone Sodium In Dextrose] Anaphylaxis   Shellfish Allergy Anaphylaxis and Itching   Tylenol [Acetaminophen] Anaphylaxis   Topamax [Topiramate] Nausea And Vomiting   Tramadol Other (See Comments)    Unknown per SO     ROS Review of Systems  Constitutional: Negative.   HENT: Negative.    Eyes: Negative.   Respiratory: Negative.    Cardiovascular: Negative.   Gastrointestinal: Negative.   Endocrine: Negative.   Genitourinary: Negative.   Musculoskeletal: Negative.   Skin: Negative.   Allergic/Immunologic: Negative.   Neurological: Negative.   Hematological: Negative.   Psychiatric/Behavioral: Negative.    All other systems reviewed and are negative.     Objective:    Physical Exam Vitals reviewed.  Constitutional:      Appearance: Normal appearance.  HENT:     Mouth/Throat:     Mouth: Mucous membranes are moist.  Eyes:     Pupils: Pupils are equal, round, and reactive to light.  Neck:     Vascular: No carotid bruit.  Cardiovascular:     Rate and Rhythm: Normal rate and regular rhythm.     Pulses: Normal pulses.     Heart sounds: Normal heart sounds.  Pulmonary:     Effort: Pulmonary effort is normal.     Breath sounds: Normal breath sounds.  Abdominal:     General: Bowel sounds are normal.     Palpations: Abdomen is soft. There is no hepatomegaly, splenomegaly or mass.     Tenderness: There is no abdominal tenderness.     Hernia: No hernia is present.  Musculoskeletal:        General: No tenderness.     Cervical back: Neck supple.     Right lower leg: No edema.     Left lower leg: No edema.  Skin:    Findings: No rash.  Neurological:     Mental Status: She is alert and oriented to person, place, and time.     Motor: No weakness.  Psychiatric:        Mood and Affect: Mood and affect normal.        Behavior: Behavior normal.     BP 128/78   Pulse 82   Ht 5' 4.5" (1.638 m)   Wt 209 lb 1.6 oz (94.8 kg)   SpO2 99%   BMI 35.34 kg/m  Wt Readings from Last 3 Encounters:  10/06/22 209 lb 1.6 oz (94.8 kg)  09/21/22 209 lb 12.8 oz (95.2 kg)  09/01/22 210 lb (95.3 kg)     Health Maintenance Due  Topic Date Due   COVID-19 Vaccine (1) Never done   Hepatitis C  Screening  Never done   DTaP/Tdap/Td (1 - Tdap) Never done   PAP SMEAR-Modifier  Never done   MAMMOGRAM  Never done    There are no preventive care reminders to display for this patient.  No results found for: "TSH" Lab Results  Component  Value Date   WBC 16.0 (H) 09/21/2022   HGB 13.1 09/21/2022   HCT 39.6 09/21/2022   MCV 78.4 (L) 09/21/2022   PLT 330 09/21/2022   Lab Results  Component Value Date   NA 144 09/10/2022   K 3.5 09/10/2022   CO2 22 09/10/2022   GLUCOSE 103 (H) 09/10/2022   BUN 14 09/10/2022   CREATININE 0.59 09/10/2022   BILITOT 0.4 09/09/2022   ALKPHOS 78 09/09/2022   AST 19 09/09/2022   ALT 18 09/09/2022   PROT 6.7 09/09/2022   ALBUMIN 3.4 (L) 09/09/2022   CALCIUM 8.8 (L) 09/10/2022   ANIONGAP 7 09/10/2022   No results found for: "CHOL" No results found for: "HDL" No results found for: "LDLCALC" Lab Results  Component Value Date   TRIG 171 (H) 06/25/2022   No results found for: "CHOLHDL" Lab Results  Component Value Date   HGBA1C 6.0 (H) 09/08/2022      Assessment & Plan:   Problem List Items Addressed This Visit       Cardiovascular and Mediastinum   Hemiplegic migraine    Under control on medicine      Hypertensive heart disease without heart failure     Patient denies any chest pain or shortness of breath there is no history of palpitation or paroxysmal nocturnal dyspnea   patient was advised to follow low-salt low-cholesterol diet    ideally I want to keep systolic blood pressure below 078 mmHg, patient was asked to check blood pressure one times a week and give me a report on that.  Patient will be follow-up in 3 months  or earlier as needed, patient will call me back for any change in the cardiovascular symptoms Patient was advised to buy a book from local bookstore concerning blood pressure and read several chapters  every day.  This will be supplemented by some of the material we will give him from the office.  Patient should  also utilize other resources like YouTube and Internet to learn more about the blood pressure and the diet.        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  - I educated them on making healthy dietary choices including eating more fruits and vegetables and less fried foods. - I encouraged the patient to exercise more, and educated on the benefits of exercise including weight loss, diabetes prevention, and hypertension prevention.   Dietary counseling with a registered dietician  Referral to a weight management support group (e.g. Weight Watchers, Overeaters Anonymous)  If your BMI is greater than 29 or you have gained more than 15 pounds you should work on weight loss.  Attend a healthy cooking class      Obesity (BMI 30.0-34.9)    Patient was advised to lose weight      Leukocytosis - Primary   Relevant Orders   Ambulatory referral to Hematology / Oncology    No orders of the defined types were placed in this encounter.   Follow-up: No follow-ups on file.    Corky Downs, MD

## 2022-10-06 NOTE — Assessment & Plan Note (Signed)
Under control on medicine 

## 2022-10-06 NOTE — Assessment & Plan Note (Signed)
Under control on medicine

## 2022-10-06 NOTE — Progress Notes (Signed)
[facility]  PATIENT: Juanna CaoKatina S Villeda DOB: 1971/05/06  REFERRING CLINICIAN: Corky DownsMasoud, Javed, MD HISTORY FROM: patient  REASON FOR VISIT: new consult   HISTORICAL  CHIEF COMPLAINT:  Chief Complaint  Patient presents with   Seizures    RM 6 with daughter  Pt is well, here to FU on recently sz. Has had a few in Aug as well, none since discharge     HISTORY OF PRESENT ILLNESS:   UPDATE (10/06/22, VRP): 51 year old female here for evaluation of seizure disorder.  History of right parietal intracerebral hemorrhage in 2009, with subsequent seizure disorder.  Had breakthrough seizures in August and November 2023.  This may have been due to noncompliance.  Here for hospital follow up. Tolerating LEV XR 2250 qhs. No more seizures.   PRIOR HPI(09/08/22, Dr. Otelia LimesLindzen): 51 y.o. female with a PMHx of epilepsy (on Keppra 1500 mg BID and Zonegran 100 mg QAM/200 mg QHS  at home), brain aneurysm, hemiplegic migraine, prior stroke with residual left hemiparesis and HTN who presents to the ED after having had a suspected allergic reaction at work due to having been "bitten on the neck". She had been given an epi pen and Benadryl 50 mg by staff at her workplace prior to EMS arrival. After arrival, she started actively seizing while on the toilet. She was given 2 mg IV Ativan with resolution of seizure activity and brought back to her room via stretcher. She did not fall or otherwise injure herself due to the seizure. Keppra 2000 mg IV load was then ordered. About halfway through administration of the loading dose of Keppra, she had onset of left face and LUE repetitive jerking movements which progressed to another GTC seizure. Additional IV Ativan 2 mg x 2 was administered with cessation of seizure activity. Another 2000 mg load of Keppra has been ordered for a total of 4000 mg.   REVIEW OF SYSTEMS: Full 14 system review of systems performed and negative with exception of: as per  HPI.  ALLERGIES: Allergies  Allergen Reactions   Amoxicillin Anaphylaxis   Fish Allergy Anaphylaxis   Morphine And Related Anaphylaxis   Penicillins Anaphylaxis    Has patient had a PCN reaction causing immediate rash, facial/tongue/throat swelling, SOB or lightheadedness with hypotension: Yes Has patient had a PCN reaction causing severe rash involving mucus membranes or skin necrosis: Yes Has patient had a PCN reaction that required hospitalization Yes Has patient had a PCN reaction occurring within the last 10 years: Yes If all of the above answers are "NO", then may proceed with Cephalosporin use.    Rocephin [Ceftriaxone Sodium In Dextrose] Anaphylaxis   Shellfish Allergy Anaphylaxis and Itching   Tylenol [Acetaminophen] Anaphylaxis   Topamax [Topiramate] Nausea And Vomiting   Tramadol Other (See Comments)    Unknown per SO    HOME MEDICATIONS: Outpatient Medications Prior to Visit  Medication Sig Dispense Refill   albuterol (VENTOLIN HFA) 108 (90 Base) MCG/ACT inhaler Inhale 2 puffs into the lungs every 6 (six) hours as needed for wheezing or shortness of breath. 3 each 3   ibuprofen (ADVIL) 200 MG tablet Take 1 tablet (200 mg total) by mouth as needed (pain). 90 tablet 3   lisinopril (ZESTRIL) 10 MG tablet Take 1 tablet (10 mg total) by mouth daily. 90 tablet 1   levETIRAcetam (KEPPRA XR) 750 MG 24 hr tablet Take 3 tablets (2,250 mg total) by mouth at bedtime. 90 tablet 0   No facility-administered medications prior to visit.  PAST MEDICAL HISTORY: Past Medical History:  Diagnosis Date   Acute respiratory failure (HCC)    Brain aneurysm    Common migraine with intractable migraine 03/03/2017   Headache    Hemiplegic migraine 10/07/2015   Late effects of cerebral ischemic stroke 02/09/2018   Left hemiparesis   Primary hypertension 12/06/2020   Seizures (HCC)     PAST SURGICAL HISTORY: Past Surgical History:  Procedure Laterality Date   ABDOMINAL HYSTERECTOMY      COLONOSCOPY WITH PROPOFOL N/A 09/01/2022   Procedure: COLONOSCOPY WITH PROPOFOL;  Surgeon: Wyline Mood, MD;  [facility]: [facility] ENDOSCOPY;  Service: Gastroenterology;  Laterality: N/A;    FAMILY HISTORY: Family History  Problem Relation Age of Onset   Stroke Mother    Hypertension Father    Diabetes Father    Migraines Sister    Migraines Brother    Migraines Sister    Migraines Sister    Migraines Sister    Migraines Sister    Migraines Sister    Hypertension Paternal Aunt    Diabetes Paternal Aunt    Stroke Paternal Aunt    Hypertension Paternal Uncle    Diabetes Paternal Uncle    Stroke Paternal Uncle    Hypertension Maternal Grandmother    Aneurysm Paternal Grandmother    Diabetes Paternal Grandmother    Hypertension Paternal Grandfather    Cancer Paternal Grandfather    Seizures Cousin     SOCIAL HISTORY: Social History   Socioeconomic History   Marital status: Married    Spouse name: Not on file   Number of children: 3   Years of education: 11   Highest education level: Not on file  Occupational History   Occupation: Designer, television/film set  Tobacco Use   Smoking status: Every Day    Packs/day: 0.50    Types: Cigarettes   Smokeless tobacco: Never  Substance and Sexual Activity   Alcohol use: Yes    Alcohol/week: 0.0 standard drinks of alcohol    Comment: occasionally   Drug use: No   Sexual activity: Not on file  Other Topics Concern   Not on file  Social History Narrative   Patient drink about 2 cups of caffeine daily.   Patient is right handed.    Social Determinants of Health   Financial Resource Strain: Not on file  Food Insecurity: Not on file  Transportation Needs: Not on file  Physical Activity: Not on file  Stress: Not on file  Social Connections: Not on file  Intimate Partner Violence: Not on file     PHYSICAL EXAM  GENERAL EXAM/CONSTITUTIONAL: Vitals:  Vitals:   10/06/22 1349  BP: (!) 139/90  Pulse: 85  Weight: 209 lb (94.8  kg)  Height: 5\' 5"  (1.651 m)   Body mass index is 34.78 kg/m. Wt Readings from Last 3 Encounters:  10/06/22 209 lb (94.8 kg)  10/06/22 209 lb 1.6 oz (94.8 kg)  09/21/22 209 lb 12.8 oz (95.2 kg)   Patient is in no distress; well developed, nourished and groomed; neck is supple  CARDIOVASCULAR: Examination of carotid arteries is normal; no carotid bruits Regular rate and rhythm, no murmurs Examination of peripheral vascular system by observation and palpation is normal  EYES: Ophthalmoscopic exam of optic discs and posterior segments is normal; no papilledema or hemorrhages No results found.  MUSCULOSKELETAL: Gait, strength, tone, movements noted in Neurologic exam below  NEUROLOGIC: MENTAL STATUS:      No data to display  awake, alert, oriented to person, place and time recent and remote memory intact normal attention and concentration language fluent, comprehension intact, naming intact fund of knowledge appropriate  CRANIAL NERVE:  2nd - no papilledema on fundoscopic exam 2nd, 3rd, 4th, 6th - pupils equal and reactive to light, visual fields full to confrontation, extraocular muscles intact, no nystagmus 5th - facial sensation symmetric 7th - facial strength symmetric 8th - hearing intact 9th - palate elevates symmetrically, uvula midline 11th - shoulder shrug symmetric 12th - tongue protrusion midline  MOTOR:  normal bulk and tone, full strength in the BUE, BLE  SENSORY:  normal and symmetric to light touch, temperature, vibration  COORDINATION:  finger-nose-finger, fine finger movements normal  REFLEXES:  deep tendon reflexes present and symmetric  GAIT/STATION:  narrow based gait     DIAGNOSTIC DATA (LABS, IMAGING, TESTING) - I reviewed patient records, labs, notes, testing and imaging myself where available.  Lab Results  Component Value Date   WBC 16.0 (H) 09/21/2022   HGB 13.1 09/21/2022   HCT 39.6 09/21/2022   MCV 78.4 (L)  09/21/2022   PLT 330 09/21/2022      Component Value Date/Time   NA 144 09/10/2022 0512   NA 145 08/29/2014 1556   K 3.5 09/10/2022 0512   K 4.0 08/29/2014 1556   CL 115 (H) 09/10/2022 0512   CL 113 (H) 08/29/2014 1556   CO2 22 09/10/2022 0512   CO2 24 08/29/2014 1556   GLUCOSE 103 (H) 09/10/2022 0512   GLUCOSE 87 08/29/2014 1556   BUN 14 09/10/2022 0512   BUN 9 08/29/2014 1556   CREATININE 0.59 09/10/2022 0512   CREATININE 0.74 08/29/2014 1556   CALCIUM 8.8 (L) 09/10/2022 0512   CALCIUM 8.5 08/29/2014 1556   PROT 6.7 09/09/2022 0658   PROT 7.9 02/01/2014 1605   ALBUMIN 3.4 (L) 09/09/2022 0658   ALBUMIN 4.0 02/01/2014 1605   AST 19 09/09/2022 0658   AST 20 02/01/2014 1605   ALT 18 09/09/2022 0658   ALT 30 02/01/2014 1605   ALKPHOS 78 09/09/2022 0658   ALKPHOS 74 02/01/2014 1605   BILITOT 0.4 09/09/2022 0658   BILITOT 0.4 02/01/2014 1605   GFRNONAA >60 09/10/2022 0512   GFRNONAA >60 08/29/2014 1556   GFRNONAA >60 02/01/2014 1605   GFRAA >60 06/02/2020 1444   GFRAA >60 08/29/2014 1556   GFRAA >60 02/01/2014 1605   Lab Results  Component Value Date   TRIG 171 (H) 06/25/2022   Lab Results  Component Value Date   HGBA1C 6.0 (H) 09/08/2022   Lab Results  Component Value Date   VITAMINB12 188 09/08/2022   No results found for: "TSH"   06/24/22 CTA head / neck 1. Patent vasculature of the head and neck with no hemodynamically significant stenosis, occlusion, or dissection. 2. No infarct core or penumbra identified on CT perfusion. 3. Mucoid impaction in some right upper lobe airways without postobstructive atelectasis.  09/08/22 CT head 1. No acute intracranial process.    ASSESSMENT AND PLAN  51 y.o. year old female here with:  Dx:  1. Seizures (HCC)   2. Intractable chronic migraine with aura with status migrainosus     PLAN:  SEIZURE DISORDER (due to prior intracerebral hemorrhage in 2009; last seizure 08/26/22)  - continue LEV XR 2250 at  bedtime  - According to [place] law, you can not drive unless you are seizure / syncope free for at least 6 months and under physician's care.   -  Please maintain precautions. Do not participate in activities where a loss of awareness could harm you or someone else. No swimming alone, no tub bathing, no hot tubs, no driving, no operating motorized vehicles (cars, ATVs, motocycles, etc), lawnmowers, power tools or firearms. No standing at heights, such as rooftops, ladders or stairs. Avoid hot objects such as stoves, heaters, open fires. Wear a helmet when riding a bicycle, scooter, skateboard, etc. and avoid areas of traffic. Set your water heater to 120 degrees or less.   MIGRAINE WITH AURA (chronic migraine; > 25 days per month) - start nurtec 75mg  every other day (failed ajovy, zonegran, botox, topiramate; cannot take triptan due to intracerebral hemorrhage)   Meds ordered this encounter  Medications   levETIRAcetam (KEPPRA XR) 750 MG 24 hr tablet    Sig: Take 3 tablets (2,250 mg total) by mouth at bedtime.    Dispense:  270 tablet    Refill:  4   Rimegepant Sulfate (NURTEC) 75 MG TBDP    Sig: Take 75 mg by mouth every other day.    Dispense:  15 tablet    Refill:  6   Return in about 6 months (around 04/07/2023) for with NP.    06/07/2023, MD 10/06/2022, 2:31 PM Certified in Neurology, Neurophysiology and Neuroimaging  [facility] [address], [address] [place], [place] [phone]

## 2022-10-06 NOTE — Assessment & Plan Note (Signed)
-,   There is no recurrence

## 2022-10-06 NOTE — Assessment & Plan Note (Signed)

## 2022-10-07 ENCOUNTER — Ambulatory Visit: Payer: Commercial Managed Care - PPO

## 2022-10-12 ENCOUNTER — Encounter: Payer: Self-pay | Admitting: Internal Medicine

## 2022-10-12 ENCOUNTER — Inpatient Hospital Stay: Payer: Commercial Managed Care - PPO

## 2022-10-12 ENCOUNTER — Ambulatory Visit: Payer: Commercial Managed Care - PPO

## 2022-10-12 ENCOUNTER — Inpatient Hospital Stay: Payer: Commercial Managed Care - PPO | Attending: Internal Medicine | Admitting: Internal Medicine

## 2022-10-12 VITALS — BP 138/86 | HR 88 | Temp 96.3°F | Resp 16 | Ht 65.0 in | Wt 210.0 lb

## 2022-10-12 DIAGNOSIS — F17210 Nicotine dependence, cigarettes, uncomplicated: Secondary | ICD-10-CM | POA: Diagnosis not present

## 2022-10-12 DIAGNOSIS — R569 Unspecified convulsions: Secondary | ICD-10-CM | POA: Diagnosis not present

## 2022-10-12 DIAGNOSIS — D72820 Lymphocytosis (symptomatic): Secondary | ICD-10-CM | POA: Insufficient documentation

## 2022-10-12 DIAGNOSIS — Z79899 Other long term (current) drug therapy: Secondary | ICD-10-CM | POA: Insufficient documentation

## 2022-10-12 DIAGNOSIS — I10 Essential (primary) hypertension: Secondary | ICD-10-CM | POA: Insufficient documentation

## 2022-10-12 DIAGNOSIS — E538 Deficiency of other specified B group vitamins: Secondary | ICD-10-CM | POA: Diagnosis not present

## 2022-10-12 DIAGNOSIS — Z8673 Personal history of transient ischemic attack (TIA), and cerebral infarction without residual deficits: Secondary | ICD-10-CM | POA: Diagnosis not present

## 2022-10-12 DIAGNOSIS — R718 Other abnormality of red blood cells: Secondary | ICD-10-CM | POA: Insufficient documentation

## 2022-10-12 LAB — CBC WITH DIFFERENTIAL/PLATELET
Abs Immature Granulocytes: 0.05 10*3/uL (ref 0.00–0.07)
Basophils Absolute: 0.1 10*3/uL (ref 0.0–0.1)
Basophils Relative: 0 %
Eosinophils Absolute: 0.3 10*3/uL (ref 0.0–0.5)
Eosinophils Relative: 2 %
HCT: 39.8 % (ref 36.0–46.0)
Hemoglobin: 12.7 g/dL (ref 12.0–15.0)
Immature Granulocytes: 0 %
Lymphocytes Relative: 35 %
Lymphs Abs: 5.1 10*3/uL — ABNORMAL HIGH (ref 0.7–4.0)
MCH: 26 pg (ref 26.0–34.0)
MCHC: 31.9 g/dL (ref 30.0–36.0)
MCV: 81.4 fL (ref 80.0–100.0)
Monocytes Absolute: 0.8 10*3/uL (ref 0.1–1.0)
Monocytes Relative: 5 %
Neutro Abs: 8.3 10*3/uL — ABNORMAL HIGH (ref 1.7–7.7)
Neutrophils Relative %: 58 %
Platelets: 315 10*3/uL (ref 150–400)
RBC: 4.89 MIL/uL (ref 3.87–5.11)
RDW: 14.9 % (ref 11.5–15.5)
WBC: 14.5 10*3/uL — ABNORMAL HIGH (ref 4.0–10.5)
nRBC: 0 % (ref 0.0–0.2)

## 2022-10-12 LAB — FERRITIN: Ferritin: 19 ng/mL (ref 11–307)

## 2022-10-12 LAB — LACTATE DEHYDROGENASE: LDH: 164 U/L (ref 98–192)

## 2022-10-12 LAB — IRON AND TIBC
Iron: 47 ug/dL (ref 28–170)
Saturation Ratios: 12 % (ref 10.4–31.8)
TIBC: 402 ug/dL (ref 250–450)
UIBC: 355 ug/dL

## 2022-10-12 LAB — FOLATE: Folate: 10.2 ng/mL (ref >5.9)

## 2022-10-12 NOTE — Progress Notes (Signed)
[place]  [phone] [phone]  ID: Langston Reusing OB: 31-Dec-1970  MR#: [id number]  UUV#:[id number]  Patient Care Team: Cletis Athens, MD as PCP - General (Internal Medicine)  REFERRING PROVIDER: Dr. Lavera Guise  REASON FOR REFERRAL: Leukocytosis  HPI: [name] is a 51 y.o. female with pmh of brain aneurysm s/p ICH, seizure, hypertension, stroke, migraine was referred to hematology for leukocytosis  Patient had intracerebral hemorrhage and seizure in 2009.  Since then her WBC has been elevated ranging mainly between 11-16,000.  [place] 9000 and lymphocyte 5.6.  MCV 78.  Hemoglobin and platelets are normal.  Patient was admitted on 09/08/2022 for seizure when she was bitten by an insect at the work when she developed shortness of breath and seizure episode.  Workup was negative.  ANA negative.  B12 188.  ESR, CRP normal.  Pathologist smear review showed minimally left shifted maturation and few atypical reactive lymphocytes.  No increase in circulating blast.   REVIEW OF SYSTEMS:   ROS  As per HPI. Otherwise, a complete review of systems is negative.  PAST MEDICAL HISTORY: Past Medical History:  Diagnosis Date   Acute respiratory failure ([place])    Brain aneurysm    Common migraine with intractable migraine 03/03/2017   Headache    Hemiplegic migraine 10/07/2015   Late effects of cerebral ischemic stroke 02/09/2018   Left hemiparesis   Primary hypertension 12/06/2020   Seizures ([place])     PAST SURGICAL HISTORY: Past Surgical History:  Procedure Laterality Date   ABDOMINAL HYSTERECTOMY     COLONOSCOPY WITH PROPOFOL N/A 09/01/2022   Procedure: COLONOSCOPY WITH PROPOFOL;  Surgeon: Jonathon Bellows, MD;  Location: [facility] ENDOSCOPY;  Service: Gastroenterology;  Laterality: N/A;    FAMILY HISTORY: Family History  Problem Relation Age of Onset   Stroke Mother    Hypertension Father    Diabetes Father    Migraines Sister    Migraines Brother     Migraines Sister    Migraines Sister    Migraines Sister    Migraines Sister    Migraines Sister    Hypertension Paternal Aunt    Diabetes Paternal Aunt    Stroke Paternal Aunt    Hypertension Paternal Uncle    Diabetes Paternal Uncle    Stroke Paternal Uncle    Hypertension Maternal Grandmother    Aneurysm Paternal Grandmother    Diabetes Paternal Grandmother    Hypertension Paternal Grandfather    Cancer Paternal Grandfather    Seizures Cousin     HEALTH MAINTENANCE: Social History   Tobacco Use   Smoking status: Every Day    Packs/day: 0.50    Types: Cigarettes   Smokeless tobacco: Never  Substance Use Topics   Alcohol use: Yes    Alcohol/week: 0.0 standard drinks of alcohol    Comment: occasionally   Drug use: No     Allergies  Allergen Reactions   Amoxicillin Anaphylaxis   Fish Allergy Anaphylaxis   Morphine And Related Anaphylaxis   Penicillins Anaphylaxis    Has patient had a PCN reaction causing immediate rash, facial/tongue/throat swelling, SOB or lightheadedness with hypotension: Yes Has patient had a PCN reaction causing severe rash involving mucus membranes or skin necrosis: Yes Has patient had a PCN reaction that required hospitalization Yes Has patient had a PCN reaction occurring within the last 10 years: Yes If all of the above answers are "NO", then may proceed with Cephalosporin use.    Rocephin [Ceftriaxone Sodium In Dextrose]  Anaphylaxis   Shellfish Allergy Anaphylaxis and Itching   Tylenol [Acetaminophen] Anaphylaxis   Topamax [Topiramate] Nausea And Vomiting   Tramadol Other (See Comments)    Unknown per SO    Current Outpatient Medications  Medication Sig Dispense Refill   albuterol (VENTOLIN HFA) 108 (90 Base) MCG/ACT inhaler Inhale 2 puffs into the lungs every 6 (six) hours as needed for wheezing or shortness of breath. 3 each 3   ibuprofen (ADVIL) 200 MG tablet Take 1 tablet (200 mg total) by mouth as needed (pain). 90 tablet 3    levETIRAcetam (KEPPRA XR) 750 MG 24 hr tablet Take 3 tablets (2,250 mg total) by mouth at bedtime. 270 tablet 4   lisinopril (ZESTRIL) 10 MG tablet Take 1 tablet (10 mg total) by mouth daily. 90 tablet 1   Rimegepant Sulfate (NURTEC) 75 MG TBDP Take 75 mg by mouth every other day. 15 tablet 6   No current facility-administered medications for this visit.    OBJECTIVE: Vitals:   10/12/22 1425  BP: 138/86  Pulse: 88  Resp: 16  Temp: (!) 96.3 F (35.7 C)  SpO2: 100%     Body mass index is 34.95 kg/m.      General: Well-developed, well-nourished, no acute distress. Eyes: Pink conjunctiva, anicteric sclera. HEENT: Normocephalic, moist mucous membranes, clear oropharnyx. Lungs: Clear to auscultation bilaterally. Heart: Regular rate and rhythm. No rubs, murmurs, or gallops. Abdomen: Soft, nontender, nondistended. No organomegaly noted, normoactive bowel sounds. Musculoskeletal: No edema, cyanosis, or clubbing. Neuro: Alert, answering all questions appropriately. Cranial nerves grossly intact. Skin: No rashes or petechiae noted. Psych: Normal affect. Lymphatics: No cervical, calvicular, axillary or inguinal LAD.   LAB RESULTS:  Lab Results  Component Value Date   NA 144 09/10/2022   K 3.5 09/10/2022   CL 115 (H) 09/10/2022   CO2 22 09/10/2022   GLUCOSE 103 (H) 09/10/2022   BUN 14 09/10/2022   CREATININE 0.59 09/10/2022   CALCIUM 8.8 (L) 09/10/2022   PROT 6.7 09/09/2022   ALBUMIN 3.4 (L) 09/09/2022   AST 19 09/09/2022   ALT 18 09/09/2022   ALKPHOS 78 09/09/2022   BILITOT 0.4 09/09/2022   GFRNONAA >60 09/10/2022   GFRAA >60 06/02/2020    Lab Results  Component Value Date   WBC 14.5 (H) 10/12/2022   NEUTROABS 8.3 (H) 10/12/2022   HGB 12.7 10/12/2022   HCT 39.8 10/12/2022   MCV 81.4 10/12/2022   PLT 315 10/12/2022    No results found for: "TIBC", "FERRITIN", "IRONPCTSAT"   STUDIES: No results found.  ASSESSMENT AND PLAN:   [name] is a 51 y.o.  female with pmh of  brain aneurysm s/p ICH, seizure, hypertension, stroke, migraine was referred to hematology for leukocytosis  # Leukocytosis -Present since 2009.  Currently neutrophilia and intermittent lymphocytosis.  Of unclear etiology. -Pathologist smear review minimally left shifted maturation and few atypical reactive lymphocytes.  No increase in circulating blast. -ESR and CRP was normal.  She smokes half pack a day.  Patient is asymptomatic, exam unremarkable -Will obtain flow cytometry  # Microcytosis -Obtain ferritin and iron panel  # Low B12 -B12 188.  Will discuss about starting B12 supplements on next visit.  Orders Placed This Encounter  Procedures   CBC with Differential   Flow cytometry panel-leukemia/lymphoma work-up   Iron and TIBC   Ferritin   Lactate dehydrogenase   Folate   RTC in 1 week via MyChart video to discuss labs.  Patient expressed understanding and  was in agreement with this plan. She also understands that She can call clinic at any time with any questions, concerns, or complaints.   I spent a total of 45 minutes reviewing chart data, face-to-face evaluation with the patient, counseling and coordination of care as detailed above.  Jane Canary, MD   10/12/2022 3:38 PM

## 2022-10-14 ENCOUNTER — Ambulatory Visit: Payer: Commercial Managed Care - PPO

## 2022-10-14 LAB — COMP PANEL: LEUKEMIA/LYMPHOMA

## 2022-10-21 ENCOUNTER — Ambulatory Visit: Payer: Commercial Managed Care - PPO

## 2022-10-22 ENCOUNTER — Encounter: Payer: Self-pay | Admitting: Internal Medicine

## 2022-10-22 ENCOUNTER — Inpatient Hospital Stay (HOSPITAL_BASED_OUTPATIENT_CLINIC_OR_DEPARTMENT_OTHER): Payer: Commercial Managed Care - PPO | Admitting: Internal Medicine

## 2022-10-22 DIAGNOSIS — D72829 Elevated white blood cell count, unspecified: Secondary | ICD-10-CM

## 2022-10-22 DIAGNOSIS — I639 Cerebral infarction, unspecified: Secondary | ICD-10-CM | POA: Insufficient documentation

## 2022-10-22 NOTE — Progress Notes (Signed)
[facility]  [phone] [phone]  I connected with [name] on 10/22/22 at  3:45 PM EST by my chart video and verified that I am speaking with the correct person using two identifiers.   I discussed the limitations, risks, security and privacy concerns of performing an evaluation and management service by telemedicine and the availability of in-person appointments. I also discussed with the patient that there may be a patient responsible charge related to this service. The patient expressed understanding and agreed to proceed.   Other persons participating in the visit and their role in the encounter: None  Patient's location: Home Provider's location: Clinic  Chief Complaint: Discuss labs   ID: [name] OB: [birth year]  MR#: [id number]  ZRA#:[id number]  Patient Care Team: Cletis Athens, MD as PCP - General (Internal Medicine)  REFERRING PROVIDER: Dr. Lavera Guise  REASON FOR REFERRAL: Leukocytosis  HPI: [name] is a 51 y.o. female with pmh of brain aneurysm s/p ICH, seizure, hypertension, stroke, migraine was referred to hematology for leukocytosis  Patient had intracerebral hemorrhage and seizure in 2009.  Since then her WBC has been elevated ranging mainly between 11-16,000.  [place] 9000 and lymphocyte 5.6.  MCV 78.  Hemoglobin and platelets are normal.  Patient was admitted on 09/08/2022 for seizure when she was bitten by an insect at the work when she developed shortness of breath and seizure episode.  Workup was negative.  ANA negative.  B12 188.  ESR, CRP normal.  Pathologist smear review showed minimally left shifted maturation and few atypical reactive lymphocytes.  No increase in circulating blast.  Interval history- Patient was seen today via MyChart video to discuss labs. She has headache for few days and has reached out to her primary for medication.  Otherwise she has been feeling well.  REVIEW OF SYSTEMS:    ROS  As per HPI. Otherwise, a complete review of systems is negative.  PAST MEDICAL HISTORY: Past Medical History:  Diagnosis Date   Acute respiratory failure ([place])    Brain aneurysm    Common migraine with intractable migraine 03/03/2017   Headache    Hemiplegic migraine 10/07/2015   Late effects of cerebral ischemic stroke 02/09/2018   Left hemiparesis   Primary hypertension 12/06/2020   Seizures ([place])     PAST SURGICAL HISTORY: Past Surgical History:  Procedure Laterality Date   ABDOMINAL HYSTERECTOMY     COLONOSCOPY WITH PROPOFOL N/A 09/01/2022   Procedure: COLONOSCOPY WITH PROPOFOL;  Surgeon: Jonathon Bellows, MD;  Location: [facility];  Service: Gastroenterology;  Laterality: N/A;    FAMILY HISTORY: Family History  Problem Relation Age of Onset   Stroke Mother    Hypertension Father    Diabetes Father    Migraines Sister    Migraines Brother    Migraines Sister    Migraines Sister    Migraines Sister    Migraines Sister    Migraines Sister    Hypertension Paternal Aunt    Diabetes Paternal Aunt    Stroke Paternal Aunt    Hypertension Paternal Uncle    Diabetes Paternal Uncle    Stroke Paternal Uncle    Hypertension Maternal Grandmother    Aneurysm Paternal Grandmother    Diabetes Paternal Grandmother    Hypertension Paternal Grandfather    Cancer Paternal Grandfather    Seizures Cousin     HEALTH MAINTENANCE: Social History   Tobacco Use   Smoking status: Every Day    Packs/day: 0.50  Types: Cigarettes   Smokeless tobacco: Never  Substance Use Topics   Alcohol use: Yes    Alcohol/week: 0.0 standard drinks of alcohol    Comment: occasionally   Drug use: No     Allergies  Allergen Reactions   Amoxicillin Anaphylaxis   Fish Allergy Anaphylaxis   Morphine And Related Anaphylaxis   Penicillins Anaphylaxis    Has patient had a PCN reaction causing immediate rash, facial/tongue/throat swelling, SOB or lightheadedness with hypotension:  Yes Has patient had a PCN reaction causing severe rash involving mucus membranes or skin necrosis: Yes Has patient had a PCN reaction that required hospitalization Yes Has patient had a PCN reaction occurring within the last 10 years: Yes If all of the above answers are "NO", then may proceed with Cephalosporin use.    Rocephin [Ceftriaxone Sodium In Dextrose] Anaphylaxis   Shellfish Allergy Anaphylaxis and Itching   Tylenol [Acetaminophen] Anaphylaxis   Topamax [Topiramate] Nausea And Vomiting   Tramadol Other (See Comments)    Unknown per SO    Current Outpatient Medications  Medication Sig Dispense Refill   albuterol (VENTOLIN HFA) 108 (90 Base) MCG/ACT inhaler Inhale 2 puffs into the lungs every 6 (six) hours as needed for wheezing or shortness of breath. 3 each 3   ibuprofen (ADVIL) 200 MG tablet Take 1 tablet (200 mg total) by mouth as needed (pain). 90 tablet 3   levETIRAcetam (KEPPRA XR) 750 MG 24 hr tablet Take 3 tablets (2,250 mg total) by mouth at bedtime. 270 tablet 4   lisinopril (ZESTRIL) 10 MG tablet Take 1 tablet (10 mg total) by mouth daily. 90 tablet 1   Rimegepant Sulfate (NURTEC) 75 MG TBDP Take 75 mg by mouth every other day. 15 tablet 6   No current facility-administered medications for this visit.    OBJECTIVE: There were no vitals filed for this visit.    There is no height or weight on file to calculate BMI.      Physical exam not performed   LAB RESULTS:  Lab Results  Component Value Date   NA 144 09/10/2022   K 3.5 09/10/2022   CL 115 (H) 09/10/2022   CO2 22 09/10/2022   GLUCOSE 103 (H) 09/10/2022   BUN 14 09/10/2022   CREATININE 0.59 09/10/2022   CALCIUM 8.8 (L) 09/10/2022   PROT 6.7 09/09/2022   ALBUMIN 3.4 (L) 09/09/2022   AST 19 09/09/2022   ALT 18 09/09/2022   ALKPHOS 78 09/09/2022   BILITOT 0.4 09/09/2022   GFRNONAA >60 09/10/2022   GFRAA >60 06/02/2020    Lab Results  Component Value Date   WBC 14.5 (H) 10/12/2022   NEUTROABS  8.3 (H) 10/12/2022   HGB 12.7 10/12/2022   HCT 39.8 10/12/2022   MCV 81.4 10/12/2022   PLT 315 10/12/2022    Lab Results  Component Value Date   TIBC 402 10/12/2022   FERRITIN 19 10/12/2022   IRONPCTSAT 12 10/12/2022     STUDIES: No results found.  ASSESSMENT AND PLAN:   ROSALBA TOTTY is a 51 y.o. female with pmh of  brain aneurysm s/p ICH, seizure, hypertension, stroke, migraine was referred to hematology for leukocytosis  # Leukocytosis -Likely reactive in nature from smoking and other medical comorbidities  -Present since 2009.  Currently neutrophilia and intermittent lymphocytosis.  -Pathologist smear review minimally left shifted maturation and few atypical reactive lymphocytes.  No increase in circulating blast. -ESR and CRP was normal.  She smokes half pack a day.  Patient is asymptomatic. Flow cytometry was negative.  -No further workup indicated at this time.  Patient can continue to follow with her primary for annual CBC with differential.  She can be referred back to hematology if leukocytosis worsens or and or patient develops concerning symptoms for malignancy.  # Microcytosis -Resolved.  Iron panel normal.   No orders of the defined types were placed in this encounter.  RTC as needed  CC Dr. Lavera Guise- thank you for your referral.  Patient expressed understanding and was in agreement with this plan. She also understands that She can call clinic at any time with any questions, concerns, or complaints.   I spent a total of 45 minutes reviewing chart data, face-to-face evaluation with the patient, counseling and coordination of care as detailed above.  Jane Canary, MD   10/22/2022 3:45 PM

## 2022-10-28 ENCOUNTER — Ambulatory Visit: Payer: Commercial Managed Care - PPO

## 2022-11-02 ENCOUNTER — Ambulatory Visit: Payer: Commercial Managed Care - PPO

## 2022-11-04 ENCOUNTER — Ambulatory Visit: Payer: Commercial Managed Care - PPO

## 2022-11-09 ENCOUNTER — Ambulatory Visit: Payer: Commercial Managed Care - PPO

## 2022-11-11 ENCOUNTER — Ambulatory Visit: Payer: Commercial Managed Care - PPO

## 2022-11-18 ENCOUNTER — Ambulatory Visit: Payer: Commercial Managed Care - PPO

## 2022-11-19 ENCOUNTER — Telehealth: Payer: Self-pay | Admitting: Diagnostic Neuroimaging

## 2022-11-19 NOTE — Telephone Encounter (Signed)
Please advise patient Rx was sent [place] [place] on 10/06/22 for 6 month supply, receipt as confirmed by pharmacy.

## 2022-11-19 NOTE — Telephone Encounter (Signed)
Pt states her pharmacy is telling her they are having difficulty in filling her Rimegepant Sulfate (NURTEC) 75 MG TBDP , pt is asking for a call on this.  Pt states they pharmacy told her they have tried reaching out to the office on this with no success.

## 2022-11-19 NOTE — Telephone Encounter (Signed)
Please submit urgent PA for nurtec

## 2022-11-19 NOTE — Telephone Encounter (Signed)
Called pt and she called me back after she talked to the pharmacy. Stated the pharmacy told her she needed PA for Rimegepant Sulfate (NURTEC) 75 MG TBDP. Pt was told the pharmacy faxed a requested over on 10/06/22 but never heard back.

## 2022-11-23 ENCOUNTER — Ambulatory Visit: Payer: Commercial Managed Care - PPO

## 2022-11-24 ENCOUNTER — Other Ambulatory Visit (HOSPITAL_COMMUNITY): Payer: Self-pay

## 2022-11-24 NOTE — Telephone Encounter (Signed)
Pharmacy Patient Advocate Encounter   Received notification from [place] that prior authorization for Nurtec 75MG  is required/requested.   PA submitted on 11/24/2022 to (ins) [employer] via CoverMyMeds Key [id number] Status is pending

## 2022-11-25 ENCOUNTER — Ambulatory Visit: Payer: Commercial Managed Care - PPO

## 2022-11-30 ENCOUNTER — Ambulatory Visit: Payer: Commercial Managed Care - PPO

## 2022-11-30 ENCOUNTER — Other Ambulatory Visit (HOSPITAL_COMMUNITY): Payer: Self-pay

## 2022-11-30 NOTE — Telephone Encounter (Signed)
Pharmacy Patient Advocate Encounter   Received notification from [facility] that prior authorization for Nurtec is required/requested.  PA submitted on 11/30/2022 to (ins) [employer] via CoverMyMeds Key [id number] Status is pending Had to resubmit PA.

## 2022-12-01 ENCOUNTER — Other Ambulatory Visit (HOSPITAL_COMMUNITY): Payer: Self-pay

## 2022-12-01 NOTE — Telephone Encounter (Signed)
Thank  you , pharmacy notified

## 2022-12-01 NOTE — Telephone Encounter (Signed)
Pharmacy Patient Advocate Encounter  Prior Authorization for Nurtec 75MG  has been approved.    PA#  PA Case ID: [id number] Effective dates: 11/30/2022 through 11/30/2023

## 2022-12-02 ENCOUNTER — Ambulatory Visit: Payer: Commercial Managed Care - PPO

## 2022-12-07 ENCOUNTER — Ambulatory Visit: Payer: Commercial Managed Care - PPO

## 2022-12-09 ENCOUNTER — Ambulatory Visit: Payer: Commercial Managed Care - PPO

## 2022-12-14 ENCOUNTER — Ambulatory Visit: Payer: Commercial Managed Care - PPO

## 2022-12-16 ENCOUNTER — Ambulatory Visit: Payer: Commercial Managed Care - PPO

## 2022-12-21 ENCOUNTER — Ambulatory Visit: Payer: Commercial Managed Care - PPO

## 2022-12-23 ENCOUNTER — Ambulatory Visit: Payer: Commercial Managed Care - PPO

## 2022-12-28 ENCOUNTER — Ambulatory Visit: Payer: Commercial Managed Care - PPO

## 2022-12-30 ENCOUNTER — Ambulatory Visit
Admission: RE | Admit: 2022-12-30 | Discharge: 2022-12-30 | Disposition: A | Discharge status: Home / Self Care | Payer: Commercial Managed Care - PPO | Source: Ambulatory Visit | Attending: Internal Medicine | Admitting: Internal Medicine

## 2022-12-30 ENCOUNTER — Ambulatory Visit: Payer: Commercial Managed Care - PPO

## 2022-12-30 DIAGNOSIS — Z1231 Encounter for screening mammogram for malignant neoplasm of breast: Secondary | ICD-10-CM | POA: Insufficient documentation

## 2023-01-04 ENCOUNTER — Ambulatory Visit: Payer: Commercial Managed Care - PPO

## 2023-01-06 ENCOUNTER — Ambulatory Visit: Payer: Commercial Managed Care - PPO

## 2023-01-11 ENCOUNTER — Ambulatory Visit: Payer: Commercial Managed Care - PPO

## 2023-01-13 ENCOUNTER — Ambulatory Visit: Payer: Commercial Managed Care - PPO

## 2023-02-26 ENCOUNTER — Emergency Department: Payer: Commercial Managed Care - PPO

## 2023-02-26 ENCOUNTER — Inpatient Hospital Stay
Admission: EM | Admit: 2023-02-26 | Discharge: 2023-02-28 | DRG: 101 | Disposition: A | Discharge status: Home / Self Care | Payer: Commercial Managed Care - PPO | Attending: Internal Medicine | Admitting: Internal Medicine

## 2023-02-26 ENCOUNTER — Ambulatory Visit: Payer: Commercial Managed Care - PPO

## 2023-02-26 DIAGNOSIS — Z8673 Personal history of transient ischemic attack (TIA), and cerebral infarction without residual deficits: Secondary | ICD-10-CM

## 2023-02-26 DIAGNOSIS — Z79899 Other long term (current) drug therapy: Secondary | ICD-10-CM

## 2023-02-26 DIAGNOSIS — D72829 Elevated white blood cell count, unspecified: Secondary | ICD-10-CM | POA: Diagnosis not present

## 2023-02-26 DIAGNOSIS — E669 Obesity, unspecified: Secondary | ICD-10-CM | POA: Diagnosis present

## 2023-02-26 DIAGNOSIS — Z720 Tobacco use: Secondary | ICD-10-CM | POA: Diagnosis present

## 2023-02-26 DIAGNOSIS — Z880 Allergy status to penicillin: Secondary | ICD-10-CM

## 2023-02-26 DIAGNOSIS — I10 Essential (primary) hypertension: Secondary | ICD-10-CM | POA: Diagnosis present

## 2023-02-26 DIAGNOSIS — G9389 Other specified disorders of brain: Secondary | ICD-10-CM | POA: Diagnosis present

## 2023-02-26 DIAGNOSIS — R569 Unspecified convulsions: Secondary | ICD-10-CM | POA: Diagnosis not present

## 2023-02-26 DIAGNOSIS — G40901 Epilepsy, unspecified, not intractable, with status epilepticus: Principal | ICD-10-CM | POA: Diagnosis present

## 2023-02-26 DIAGNOSIS — R059 Cough, unspecified: Secondary | ICD-10-CM | POA: Diagnosis present

## 2023-02-26 DIAGNOSIS — T426X6A Underdosing of other antiepileptic and sedative-hypnotic drugs, initial encounter: Secondary | ICD-10-CM | POA: Diagnosis present

## 2023-02-26 DIAGNOSIS — Z91128 Patient's intentional underdosing of medication regimen for other reason: Secondary | ICD-10-CM

## 2023-02-26 DIAGNOSIS — F17210 Nicotine dependence, cigarettes, uncomplicated: Secondary | ICD-10-CM | POA: Diagnosis present

## 2023-02-26 DIAGNOSIS — Z833 Family history of diabetes mellitus: Secondary | ICD-10-CM

## 2023-02-26 DIAGNOSIS — G43409 Hemiplegic migraine, not intractable, without status migrainosus: Secondary | ICD-10-CM | POA: Diagnosis present

## 2023-02-26 DIAGNOSIS — Z8249 Family history of ischemic heart disease and other diseases of the circulatory system: Secondary | ICD-10-CM

## 2023-02-26 DIAGNOSIS — I69354 Hemiplegia and hemiparesis following cerebral infarction affecting left non-dominant side: Secondary | ICD-10-CM

## 2023-02-26 DIAGNOSIS — Z803 Family history of malignant neoplasm of breast: Secondary | ICD-10-CM

## 2023-02-26 DIAGNOSIS — Z823 Family history of stroke: Secondary | ICD-10-CM

## 2023-02-26 DIAGNOSIS — Z6838 Body mass index (BMI) 38.0-38.9, adult: Secondary | ICD-10-CM

## 2023-02-26 DIAGNOSIS — Z90710 Acquired absence of both cervix and uterus: Secondary | ICD-10-CM

## 2023-02-26 LAB — COMPREHENSIVE METABOLIC PANEL
ALT: 19 U/L (ref 0–44)
AST: 22 U/L (ref 15–41)
Albumin: 3.8 g/dL (ref 3.5–5.0)
Alkaline Phosphatase: 83 U/L (ref 38–126)
Anion gap: 9 (ref 5–15)
BUN: 12 mg/dL (ref 6–20)
CO2: 26 mmol/L (ref 22–32)
Calcium: 9.4 mg/dL (ref 8.9–10.3)
Chloride: 108 mmol/L (ref 98–111)
Creatinine, Ser: 0.72 mg/dL (ref 0.44–1.00)
GFR, Estimated: >60 mL/min (ref >60)
Glucose, Bld: 107 mg/dL — ABNORMAL HIGH (ref 70–99)
Potassium: 3.7 mmol/L (ref 3.5–5.1)
Sodium: 143 mmol/L (ref 135–145)
Total Bilirubin: 0.2 mg/dL — ABNORMAL LOW (ref 0.3–1.2)
Total Protein: 7.5 g/dL (ref 6.5–8.1)

## 2023-02-26 LAB — URINALYSIS, ROUTINE W REFLEX MICROSCOPIC
Bilirubin Urine: NEGATIVE
Glucose, UA: NEGATIVE mg/dL
Ketones, ur: NEGATIVE mg/dL
Nitrite: NEGATIVE
Protein, ur: NEGATIVE mg/dL
Specific Gravity, Urine: 1.008 (ref 1.005–1.030)
pH: 5 (ref 5.0–8.0)

## 2023-02-26 LAB — URINE DRUG SCREEN, QUALITATIVE (ARMC ONLY)
Amphetamines, Ur Screen: NOT DETECTED
Barbiturates, Ur Screen: NOT DETECTED
Benzodiazepine, Ur Scrn: NOT DETECTED
Cannabinoid 50 Ng, Ur ~~LOC~~: NOT DETECTED
Cocaine Metabolite,Ur ~~LOC~~: NOT DETECTED
MDMA (Ecstasy)Ur Screen: NOT DETECTED
Methadone Scn, Ur: NOT DETECTED
Opiate, Ur Screen: NOT DETECTED
Phencyclidine (PCP) Ur S: NOT DETECTED
Tricyclic, Ur Screen: NOT DETECTED

## 2023-02-26 LAB — CBC WITH DIFFERENTIAL/PLATELET
Abs Immature Granulocytes: 0.04 10*3/uL (ref 0.00–0.07)
Basophils Absolute: 0.1 10*3/uL (ref 0.0–0.1)
Basophils Relative: 0 %
Eosinophils Absolute: 0.2 10*3/uL (ref 0.0–0.5)
Eosinophils Relative: 2 %
HCT: 41.8 % (ref 36.0–46.0)
Hemoglobin: 13.1 g/dL (ref 12.0–15.0)
Immature Granulocytes: 0 %
Lymphocytes Relative: 34 %
Lymphs Abs: 4.2 10*3/uL — ABNORMAL HIGH (ref 0.7–4.0)
MCH: 26 pg (ref 26.0–34.0)
MCHC: 31.3 g/dL (ref 30.0–36.0)
MCV: 82.9 fL (ref 80.0–100.0)
Monocytes Absolute: 0.6 10*3/uL (ref 0.1–1.0)
Monocytes Relative: 5 %
Neutro Abs: 7.4 10*3/uL (ref 1.7–7.7)
Neutrophils Relative %: 59 %
Platelets: 375 10*3/uL (ref 150–400)
RBC: 5.04 MIL/uL (ref 3.87–5.11)
RDW: 14.8 % (ref 11.5–15.5)
WBC: 12.5 10*3/uL — ABNORMAL HIGH (ref 4.0–10.5)
nRBC: 0 % (ref 0.0–0.2)

## 2023-02-26 LAB — GLUCOSE, CAPILLARY: Glucose-Capillary: 107 mg/dL — ABNORMAL HIGH (ref 70–99)

## 2023-02-26 LAB — PREGNANCY, URINE: Preg Test, Ur: NEGATIVE

## 2023-02-26 LAB — CK: Total CK: 151 U/L (ref 38–234)

## 2023-02-26 MED ORDER — LEVETIRACETAM ER 750 MG PO TB24
2250.0000 mg | ORAL_TABLET | Freq: Every day | ORAL | Status: DC
  Administered 2023-02-26: 2250 mg via ORAL
  Filled 2023-02-26: qty 3

## 2023-02-26 MED ORDER — HYDRALAZINE HCL 20 MG/ML IJ SOLN: 5.0000 mg | INTRAMUSCULAR | Status: DC | PRN

## 2023-02-26 MED ORDER — SODIUM CHLORIDE 0.9 % IV SOLN: 4000.0000 mg | Freq: Once | INTRAVENOUS | Status: DC

## 2023-02-26 MED ORDER — LORAZEPAM 2 MG/ML IJ SOLN
2.0000 mg | INTRAMUSCULAR | Status: DC | PRN
  Administered 2023-02-26 – 2023-02-27 (×2): 2 mg via INTRAVENOUS
  Filled 2023-02-26 (×5): qty 1

## 2023-02-26 MED ORDER — ONDANSETRON HCL 4 MG/2ML IJ SOLN
4.0000 mg | Freq: Three times a day (TID) | INTRAMUSCULAR | Status: DC | PRN
  Administered 2023-02-27: 4 mg via INTRAVENOUS
  Filled 2023-02-26: qty 2

## 2023-02-26 MED ORDER — LEVETIRACETAM IN NACL 1000 MG/100ML IV SOLN
1000.0000 mg | Freq: Once | INTRAVENOUS | Status: AC
  Administered 2023-02-26: 1000 mg via INTRAVENOUS
  Filled 2023-02-26: qty 100

## 2023-02-26 MED ORDER — ENOXAPARIN SODIUM 60 MG/0.6ML IJ SOSY
0.5000 mg/kg | PREFILLED_SYRINGE | INTRAMUSCULAR | Status: DC
  Administered 2023-02-26 – 2023-02-27 (×2): 52.5 mg via SUBCUTANEOUS
  Filled 2023-02-26 (×2): qty 0.6

## 2023-02-26 MED ORDER — IBUPROFEN 400 MG PO TABS
200.0000 mg | ORAL_TABLET | Freq: Four times a day (QID) | ORAL | Status: DC | PRN
  Administered 2023-02-26 – 2023-02-27 (×2): 200 mg via ORAL
  Filled 2023-02-26 (×2): qty 1

## 2023-02-26 MED ORDER — LORAZEPAM 2 MG/ML IJ SOLN: 2.0000 mg | Freq: Once | INTRAMUSCULAR | Status: AC

## 2023-02-26 MED ORDER — RIMEGEPANT SULFATE 75 MG PO TBDP: 75.0000 mg | ORAL_TABLET | ORAL | Status: DC

## 2023-02-26 MED ORDER — NICOTINE 21 MG/24HR TD PT24
21.0000 mg | MEDICATED_PATCH | Freq: Every day | TRANSDERMAL | Status: DC
  Administered 2023-02-26 – 2023-02-28 (×3): 21 mg via TRANSDERMAL
  Filled 2023-02-26 (×3): qty 1

## 2023-02-26 MED ORDER — LORAZEPAM 2 MG/ML IJ SOLN
INTRAMUSCULAR | Status: AC
  Administered 2023-02-26: 2 mg via INTRAVENOUS
  Filled 2023-02-26: qty 2

## 2023-02-26 MED ORDER — LEVETIRACETAM IN NACL 1000 MG/100ML IV SOLN
1000.0000 mg | Freq: Once | INTRAVENOUS | Status: AC
  Administered 2023-02-26: 1000 mg via INTRAVENOUS

## 2023-02-26 MED ORDER — ORAL CARE MOUTH RINSE: 15.0000 mL | OROMUCOSAL | Status: DC | PRN

## 2023-02-26 MED ORDER — LISINOPRIL 10 MG PO TABS
10.0000 mg | ORAL_TABLET | Freq: Every day | ORAL | Status: DC
  Administered 2023-02-27 – 2023-02-28 (×2): 10 mg via ORAL
  Filled 2023-02-26 (×2): qty 1

## 2023-02-26 MED ORDER — LORAZEPAM 2 MG/ML IJ SOLN
2.0000 mg | Freq: Once | INTRAMUSCULAR | Status: AC
  Administered 2023-02-26: 2 mg via INTRAVENOUS

## 2023-02-26 MED ORDER — SODIUM CHLORIDE 0.9 % IV BOLUS
1000.0000 mL | Freq: Once | INTRAVENOUS | Status: AC
  Administered 2023-02-26: 1000 mL via INTRAVENOUS

## 2023-02-26 NOTE — ED Notes (Signed)
Assisted pt to ambulate to bathroom.

## 2023-02-26 NOTE — Procedures (Signed)
Patient Name: [name]  MRN: [id number]  Epilepsy Attending: Charlsie Quest  Referring Physician/Provider: Concha Se, MD  Date: 02/26/2023  Duration: 25.19 mins  Patient history: 52 y.o. female with history of seizures who comes in with concerns for seizure activity. EEG to evaluate for seizure  Level of alertness: Asleep  AEDs during EEG study: Ativan  Technical aspects: This EEG study was done with scalp electrodes positioned according to the 10-20 International system of electrode placement. Electrical activity was reviewed with band pass filter of 1-70Hz , sensitivity of 7 uV/mm, display speed of 7mm/sec with a 60Hz  notched filter applied as appropriate. EEG data were recorded continuously and digitally stored.  Video monitoring was available and reviewed as appropriate.  Description: Sleep was characterized by vertex waves, sleep spindles (12 to 14 Hz), maximal frontocentral region.Hyperventilation and photic stimulation were not performed.     IMPRESSION: This study during sleep only is within normal limits. No seizures or epileptiform discharges were seen throughout the recording.  A normal interictal EEG does not exclude the diagnosis of epilepsy.  Dabney Dever Annabelle Harman

## 2023-02-26 NOTE — H&P (Signed)
History and Physical    [name]:[id number] DOB: Jul 15, 1971 DOA: 02/26/2023  Referring MD/NP/PA:   PCP: Corky Downs, MD   Patient coming from:  The patient is coming from home.     Chief Complaint: seizure  HPI: [name] is a 52 y.o. female with medical history significant of seizure, hypertension, stroke, hemiplegic migraine, brain aneurysm, who presents with seizure.  Per her husband at the bedside, patient has history of seizure,  and is on Keppra, but the patient has not completely compliant to taking Keppra.  She missed some doses intermittently.  It appears that she is on 2250 mg of Keppara at bedtime. She developed seizure today.  Patient had 2 seizures at home and 3 seizures in car that were tonic-clonic, and not coming back to baseline. Patient was given 2.5 of IV Versed by EMS, 1g of keppra x 4 and 2 mg of Ativan x 2 doses in ED. Her seizure has resolved in ED. When I saw patient in ED, patient is very drowsy, but arousable, oriented x 3 when aroused.  Patient moves all extremities.  She has mild dry cough, denies chest pain, short of breath, nausea, vomiting, diarrhea.  Denies symptoms of UTI.  She moves all extremities.  No facial droop or slurred speech.   Data reviewed independently and ED Course: pt was found to have WBC 12.5, negative UDS, negative pregnancy test, CK level 151, GFR> 60, temperature normal, blood pressure 127/73, heart rate 89, RR 18, oxygen saturation 100% on room air.  CT head negative.  CT of C-spine negative for acute injury, but showed degenerative disc disease.  Patient is placed in PCU for observation.  Dr. Otelia Limes of neurology is consulted.   EKG: I have personally reviewed.  Sinus rhythm, low voltage, QTc 471, nonspecific T wave change.   Review of Systems:   General: no fevers, chills, no body weight gain, has fatigue HEENT: no blurry vision, hearing changes or sore throat Respiratory: no dyspnea, has dry coughing, no  wheezing CV: no chest pain, no palpitations GI: no nausea, vomiting, abdominal pain, diarrhea, constipation GU: no dysuria, burning on urination, increased urinary frequency, hematuria  Ext: no leg edema Neuro: no unilateral weakness, numbness, or tingling, no vision change or hearing loss. Has seizure Skin: no rash, no skin tear. MSK: No muscle spasm, no deformity, no limitation of range of movement in spin Heme: No easy bruising.  Travel history: No recent long distant travel.   Allergy:  Allergies  Allergen Reactions   Amoxicillin Anaphylaxis   Fish Allergy Anaphylaxis   Morphine And Related Anaphylaxis   Penicillins Anaphylaxis    Has patient had a PCN reaction causing immediate rash, facial/tongue/throat swelling, SOB or lightheadedness with hypotension: Yes Has patient had a PCN reaction causing severe rash involving mucus membranes or skin necrosis: Yes Has patient had a PCN reaction that required hospitalization Yes Has patient had a PCN reaction occurring within the last 10 years: Yes If all of the above answers are "NO", then may proceed with Cephalosporin use.    Rocephin [Ceftriaxone Sodium In Dextrose] Anaphylaxis   Shellfish Allergy Anaphylaxis and Itching   Tylenol [Acetaminophen] Anaphylaxis   Topamax [Topiramate] Nausea And Vomiting   Tramadol Other (See Comments)    Unknown per SO    Past Medical History:  Diagnosis Date   Acute respiratory failure (HCC)    Brain aneurysm    Common migraine with intractable migraine 03/03/2017   Headache  Hemiplegic migraine 10/07/2015   Late effects of cerebral ischemic stroke 02/09/2018   Left hemiparesis   Primary hypertension 12/06/2020   Seizures (HCC)     Past Surgical History:  Procedure Laterality Date   ABDOMINAL HYSTERECTOMY     COLONOSCOPY WITH PROPOFOL N/A 09/01/2022   Procedure: COLONOSCOPY WITH PROPOFOL;  Surgeon: Wyline Mood, MD;  Location: [facility] ENDOSCOPY;  Service: Gastroenterology;  Laterality:  N/A;    Social History:  reports that she has been smoking cigarettes. She has been smoking an average of .5 packs per day. She has never used smokeless tobacco. She reports current alcohol use. She reports that she does not use drugs.  Family History:  Family History  Problem Relation Age of Onset   Stroke Mother    Hypertension Father    Diabetes Father    Migraines Sister    Migraines Sister    Migraines Sister    Migraines Sister    Migraines Sister    Migraines Sister    Breast cancer Paternal Aunt 23   Hypertension Paternal Aunt    Diabetes Paternal Aunt    Stroke Paternal Aunt    Hypertension Paternal Uncle    Diabetes Paternal Uncle    Stroke Paternal Uncle    Hypertension Maternal Grandmother    Aneurysm Paternal Grandmother    Diabetes Paternal Grandmother    Hypertension Paternal Grandfather    Cancer Paternal Grandfather    Seizures Cousin    Migraines Brother      Prior to Admission medications   Medication Sig Start Date End Date Taking? Authorizing Provider  albuterol (VENTOLIN HFA) 108 (90 Base) MCG/ACT inhaler Inhale 2 puffs into the lungs every 6 (six) hours as needed for wheezing or shortness of breath. 07/27/22  Yes Masoud, Renda Rolls, MD  ibuprofen (ADVIL) 200 MG tablet Take 1 tablet (200 mg total) by mouth as needed (pain). 07/27/22  Yes Masoud, Renda Rolls, MD  Rimegepant Sulfate (NURTEC) 75 MG TBDP Take 75 mg by mouth every other day. 10/06/22  Yes Penumalli, Glenford Bayley, MD  levETIRAcetam (KEPPRA XR) 750 MG 24 hr tablet Take 3 tablets (2,250 mg total) by mouth at bedtime. 10/06/22 12/30/23  Penumalli, Glenford Bayley, MD  lisinopril (ZESTRIL) 10 MG tablet Take 1 tablet (10 mg total) by mouth daily. 09/21/22   Corky Downs, MD    Physical Exam: Vitals:   02/26/23 0900 02/26/23 0930 02/26/23 1205  BP: 116/73 127/73   Pulse: 82 89   Resp: 18 17   Temp: 98.2 F (36.8 C)    TempSrc: Oral    SpO2: 100% 100%   Weight:   105.4 kg   General: Not in acute  distress HEENT:       Eyes: PERRL, EOMI, no scleral icterus.       ENT: No discharge from the ears and nose, no pharynx injection, no tonsillar enlargement.        Neck: No JVD, no bruit, no mass felt. Heme: No neck lymph node enlargement. Cardiac: S1/S2, RRR, No murmurs, No gallops or rubs. Respiratory: No rales, wheezing, rhonchi or rubs. GI: Soft, nondistended, nontender, no rebound pain, no organomegaly, BS present. GU: No hematuria Ext: No pitting leg edema bilaterally. 1+DP/PT pulse bilaterally. Musculoskeletal: No joint deformities, No joint redness or warmth, no limitation of ROM in spin. Skin: No rashes.  Neuro: Patient is very drowsy, and in postictal status, arousable, oriented X3, cranial nerves II-XII grossly intact, moves all extremities Psych: Patient is not psychotic, no suicidal or hemocidal  ideation.  Labs on Admission: I have personally reviewed following labs and imaging studies  CBC: Recent Labs  Lab 02/26/23 0926  WBC 12.5*  NEUTROABS 7.4  HGB 13.1  HCT 41.8  MCV 82.9  PLT 375   Basic Metabolic Panel: Recent Labs  Lab 02/26/23 0926  NA 143  K 3.7  CL 108  CO2 26  GLUCOSE 107*  BUN 12  CREATININE 0.72  CALCIUM 9.4   GFR: Estimated Creatinine Clearance: 100.3 mL/min (by C-G formula based on SCr of 0.72 mg/dL). Liver Function Tests: Recent Labs  Lab 02/26/23 0926  AST 22  ALT 19  ALKPHOS 83  BILITOT 0.2*  PROT 7.5  ALBUMIN 3.8   No results for input(s): "LIPASE", "AMYLASE" in the last 168 hours. No results for input(s): "AMMONIA" in the last 168 hours. Coagulation Profile: No results for input(s): "INR", "PROTIME" in the last 168 hours. Cardiac Enzymes: Recent Labs  Lab 02/26/23 0926  CKTOTAL 151   BNP (last 3 results) No results for input(s): "PROBNP" in the last 8760 hours. HbA1C: No results for input(s): "HGBA1C" in the last 72 hours. CBG: No results for input(s): "GLUCAP" in the last 168 hours. Lipid Profile: No results  for input(s): "CHOL", "HDL", "LDLCALC", "TRIG", "CHOLHDL", "LDLDIRECT" in the last 72 hours. Thyroid Function Tests: No results for input(s): "TSH", "T4TOTAL", "FREET4", "T3FREE", "THYROIDAB" in the last 72 hours. Anemia Panel: No results for input(s): "VITAMINB12", "FOLATE", "FERRITIN", "TIBC", "IRON", "RETICCTPCT" in the last 72 hours. Urine analysis:    Component Value Date/Time   COLORURINE YELLOW (A) 02/26/2023 0945   APPEARANCEUR HAZY (A) 02/26/2023 0945   APPEARANCEUR Clear 08/29/2014 1556   LABSPEC 1.008 02/26/2023 0945   LABSPEC 1.012 08/29/2014 1556   PHURINE 5.0 02/26/2023 0945   GLUCOSEU NEGATIVE 02/26/2023 0945   GLUCOSEU Negative 08/29/2014 1556   HGBUR LARGE (A) 02/26/2023 0945   BILIRUBINUR NEGATIVE 02/26/2023 0945   BILIRUBINUR Negative 08/29/2014 1556   KETONESUR NEGATIVE 02/26/2023 0945   PROTEINUR NEGATIVE 02/26/2023 0945   UROBILINOGEN 1.0 03/18/2008 1923   NITRITE NEGATIVE 02/26/2023 0945   LEUKOCYTESUR TRACE (A) 02/26/2023 0945   LEUKOCYTESUR Negative 08/29/2014 1556   Sepsis Labs: @LABRCNTIP (procalcitonin:4,lacticidven:4) )No results found for this or any previous visit (from the past 240 hour(s)).   Radiological Exams on Admission: EEG adult  Result Date: 02/26/2023 Charlsie Quest, MD     02/26/2023  1:50 PM Patient Name: MANPREET RIGGENBACH MRN: [id number] Epilepsy Attending: Charlsie Quest Referring Physician/Provider: Concha Se, MD Date: 02/26/2023 Duration: 25.19 mins Patient history: 52 y.o. female with history of seizures who comes in with concerns for seizure activity. EEG to evaluate for seizure Level of alertness: Asleep AEDs during EEG study: Ativan Technical aspects: This EEG study was done with scalp electrodes positioned according to the 10-20 International system of electrode placement. Electrical activity was reviewed with band pass filter of 1-70Hz , sensitivity of 7 uV/mm, display speed of 15mm/sec with a 60Hz  notched filter applied as  appropriate. EEG data were recorded continuously and digitally stored.  Video monitoring was available and reviewed as appropriate. Description: Sleep was characterized by vertex waves, sleep spindles (12 to 14 Hz), maximal frontocentral region.Hyperventilation and photic stimulation were not performed.   IMPRESSION: This study during sleep only is within normal limits. No seizures or epileptiform discharges were seen throughout the recording. A normal interictal EEG does not exclude the diagnosis of epilepsy. Charlsie Quest   CT Cervical Spine Wo Contrast  Result Date: 02/26/2023  CLINICAL DATA:  52 year old female status post witnessed seizure activity. History of medial right parietal lobe encephalomalacia and hemosiderin. EXAM: CT CERVICAL SPINE WITHOUT CONTRAST TECHNIQUE: Multidetector CT imaging of the cervical spine was performed without intravenous contrast. Multiplanar CT image reconstructions were also generated. RADIATION DOSE REDUCTION: This exam was performed according to the departmental dose-optimization program which includes automated exposure control, adjustment of the mA and/or kV according to patient size and/or use of iterative reconstruction technique. COMPARISON:  Head CT today.  Cervical spine CT 12/19/2011. FINDINGS: Alignment: Chronic straightening, mild reversal of cervical lordosis today. Cervicothoracic junction alignment is within normal limits. Bilateral posterior element alignment is within normal limits. Skull base and vertebrae: Bone mineralization is within normal limits. Visualized skull base is intact. No atlanto-occipital dissociation. C1 and C2 appear intact and aligned. No acute osseous abnormality identified. Soft tissues and spinal canal: No prevertebral fluid or swelling. No visible canal hematoma. Negative visible noncontrast neck soft tissues. Disc levels: Mild for age cervical spine degeneration. No CT evidence of cervical spinal stenosis. Upper chest: Negative.  IMPRESSION: 1. No acute traumatic injury identified in the cervical spine. 2. Mild for age cervical spine degeneration. Electronically Signed   By: Odessa Fleming M.D.   On: 02/26/2023 10:47   CT HEAD WO CONTRAST ( )  Result Date: 02/26/2023 CLINICAL DATA:  52 year old female status post witnessed seizure activity. History of medial right parietal lobe encephalomalacia and hemosiderin. EXAM: CT HEAD WITHOUT CONTRAST TECHNIQUE: Contiguous axial images were obtained from the base of the skull through the vertex without intravenous contrast. RADIATION DOSE REDUCTION: This exam was performed according to the departmental dose-optimization program which includes automated exposure control, adjustment of the mA and/or kV according to patient size and/or use of iterative reconstruction technique. COMPARISON:  Brain MRI 01/19/2021, Head CT 09/08/2022. FINDINGS: Brain: Medial right parietal lobe encephalomalacia remains relatively subtle by CT (coronal image 44). Background brain volume remains normal. No superimposed No midline shift, ventriculomegaly, mass effect, evidence of mass lesion, intracranial hemorrhage or evidence of cortically based acute infarction. Gray-white differentiation remains within normal limits except for the encephalomalacia. Vascular: No suspicious intracranial vascular hyperdensity. Skull: Intact, negative. Sinuses/Orbits: Visualized paranasal sinuses and mastoids are stable and well aerated. Other: Visualized orbits and scalp soft tissues are within normal limits. IMPRESSION: No acute intracranial abnormality. Stable non contrast CT appearance of the brain, negative aside from the chronic medial right parietal lobe encephalomalacia. Electronically Signed   By: Odessa Fleming M.D.   On: 02/26/2023 10:45   DG Chest Portable 1 View  Result Date: 02/26/2023 CLINICAL DATA:  Seizures. EXAM: PORTABLE CHEST 1 VIEW COMPARISON:  09/08/2022 and prior studies. FINDINGS: Cardiac silhouette is normal in size. Normal  mediastinal and hilar contours. Clear lungs.  No pleural effusion or pneumothorax. Skeletal structures are grossly intact. IMPRESSION: No active disease. Electronically Signed   By: Amie Portland M.D.   On: 02/26/2023 10:10      Assessment/Plan Principal Problem:   Seizure (HCC) Active Problems:   Hemiplegic migraine   Hx of cerebral infarction   Leukocytosis   Tobacco abuse   Obesity (BMI 30-39.9)   Assessment and Plan:  Seizure [facility]): Patient's recurrent seizure is likely due to medication noncompliance.  CT head negative.  Patient was loaded with total of 4 g of Keppra and given 4 mg of Ativan in the ED.  Consulted Dr. Otelia Limes of neuro  -Placed on PCU for observation -EEG -Frequent neurocheck -Seizure precaution -As needed Ativan for seizure -pending Keppra  level -Continue home Keppra 2250 mg daily  Hemiplegic migraine -Nurtec 75 mg every other day  Hx of cerebral infarction: Patient is not taking aspirin or statin currently -Observe closely  Leukocytosis: WBC 12.5.  No fever.  Urinalysis showed hazy appearance, trace amount of leukocyte, rare bacteria, WBC 6-10, but with squamous cell contamination.  Patient denies symptoms of UTI. -Will not start antibiotics  Tobacco abuse -Nicotine patch  Obesity (BMI 30-39.9): Body weight 105.4 kg, BMI 38.67 -When pt's mental status improves, will need to encourage pt to lose weight, and take healthy diet and do more exercise.     DVT ppx:  SQ Lovenox  Code Status: Full code  Family Communication:    Yes, patient's husband   at bed side.     Disposition Plan:  Anticipate discharge back to previous environment  Consults called:  Dr. Otelia Limes of neuro  Admission status and Level of care: Progressive:  for obs    Dispo: The patient is from: Home              Anticipated d/c is to: Home              Anticipated d/c date is: 1 day              Patient currently is not medically stable to d/c.    Severity of  Illness:  The appropriate patient status for this patient is OBSERVATION. Observation status is judged to be reasonable and necessary in order to provide the required intensity of service to ensure the patient's safety. The patient's presenting symptoms, physical exam findings, and initial radiographic and laboratory data in the context of their medical condition is felt to place them at decreased risk for further clinical deterioration. Furthermore, it is anticipated that the patient will be medically stable for discharge from the hospital within 2 midnights of admission.        Date of Service 02/26/2023    Lorretta Harp [facility]   If 7PM-7AM, please contact night-coverage [email or web address] 02/26/2023, 2:35 PM

## 2023-02-26 NOTE — Consult Note (Addendum)
NEURO HOSPITALIST CONSULT NOTE   Requesting physician: Dr. Fuller Plan  Reason for Consult: Status epilepticus, now resolved  History obtained from:  Husband and Chart     HPI:                                                                                                                                          [name] is an 52 y.o. female with a PMHx of acute respiratory failure, brain aneurysm, migraine headaches including intractable migraine and hemiplegic migraine, stroke with left hemiparesis, HTN and seizures (on Keppra) who presented to the ED this morning in status epilepticus. She had 3 GTC seizures in her car without returning to baseline. EMS was called and on their arrival she had 2 additional witnessed GTC seizures, lasting about 30 seconds each, without an intervening return to baseline. She was administered 2.5 mg of IV Versed by EMS. Her CBG was 103 and BP 153/89. She was postictal on arrival to the ED. She reported a headache on initial interview.   Of note, the patient has missed many of her recent doses of Keppra in the context of long-term semicompliance, according to her husband. On review of records when she was admitted back in November 2023 for seizures it appears that she had a prescription for 2250 mg Keppra XR at bedtime.   EEG in the ED while patient was asleep was normal.   Past Medical History:  Diagnosis Date   Acute respiratory failure (HCC)    Brain aneurysm    Common migraine with intractable migraine 03/03/2017   Headache    Hemiplegic migraine 10/07/2015   Late effects of cerebral ischemic stroke 02/09/2018   Left hemiparesis   Primary hypertension 12/06/2020   Seizures (HCC)     Past Surgical History:  Procedure Laterality Date   ABDOMINAL HYSTERECTOMY     COLONOSCOPY WITH PROPOFOL N/A 09/01/2022   Procedure: COLONOSCOPY WITH PROPOFOL;  Surgeon: Wyline Mood, MD;  Location: [facility] ENDOSCOPY;  Service: Gastroenterology;   Laterality: N/A;    Family History  Problem Relation Age of Onset   Stroke Mother    Hypertension Father    Diabetes Father    Migraines Sister    Migraines Sister    Migraines Sister    Migraines Sister    Migraines Sister    Migraines Sister    Breast cancer Paternal Aunt 66   Hypertension Paternal Aunt    Diabetes Paternal Aunt    Stroke Paternal Aunt    Hypertension Paternal Uncle    Diabetes Paternal Uncle    Stroke Paternal Uncle    Hypertension Maternal Grandmother    Aneurysm Paternal Grandmother    Diabetes Paternal Grandmother    Hypertension Paternal Grandfather    Cancer Paternal Grandfather  Seizures Cousin    Migraines Brother              Social History:  reports that she has been smoking cigarettes. She has been smoking an average of .5 packs per day. She has never used smokeless tobacco. She reports current alcohol use. She reports that she does not use drugs.  Allergies  Allergen Reactions   Amoxicillin Anaphylaxis   Fish Allergy Anaphylaxis   Morphine And Related Anaphylaxis   Penicillins Anaphylaxis    Has patient had a PCN reaction causing immediate rash, facial/tongue/throat swelling, SOB or lightheadedness with hypotension: Yes Has patient had a PCN reaction causing severe rash involving mucus membranes or skin necrosis: Yes Has patient had a PCN reaction that required hospitalization Yes Has patient had a PCN reaction occurring within the last 10 years: Yes If all of the above answers are "NO", then may proceed with Cephalosporin use.    Rocephin [Ceftriaxone Sodium In Dextrose] Anaphylaxis   Shellfish Allergy Anaphylaxis and Itching   Tylenol [Acetaminophen] Anaphylaxis   Topamax [Topiramate] Nausea And Vomiting   Tramadol Other (See Comments)    Unknown per SO    MEDICATIONS:                                                                                                                     No current facility-administered medications on  file prior to encounter.   Current Outpatient Medications on File Prior to Encounter  Medication Sig Dispense Refill   ibuprofen (ADVIL) 200 MG tablet Take 1 tablet (200 mg total) by mouth as needed (pain). 90 tablet 3   levETIRAcetam (KEPPRA XR) 750 MG 24 hr tablet Take 3 tablets (2,250 mg total) by mouth at bedtime. 270 tablet 4   lisinopril (ZESTRIL) 10 MG tablet Take 1 tablet (10 mg total) by mouth daily. 90 tablet 1   Rimegepant Sulfate (NURTEC) 75 MG TBDP Take 75 mg by mouth every other day. 15 tablet 6   albuterol (VENTOLIN HFA) 108 (90 Base) MCG/ACT inhaler Inhale 2 puffs into the lungs every 6 (six) hours as needed for wheezing or shortness of breath. (Patient not taking: Reported on 02/26/2023) 3 each 3  Note: Poorly compliant with Keppra  Scheduled:  enoxaparin (LOVENOX) injection  0.5 mg/kg Subcutaneous Q24H   [START ON 02/27/2023] lisinopril  10 mg Oral Daily   nicotine  21 mg Transdermal Daily   [START ON 02/27/2023] Rimegepant Sulfate  75 mg Oral QODAY   Continuous:   ROS:  Unable to obtain due to postictal state.    Blood pressure 127/73, pulse 89, temperature 98.2 F (36.8 C), temperature source Oral, resp. rate 17, SpO2 100 %.   General Examination:                                                                                                       Physical Exam HEENT- Blunt/AT   Lungs- Respirations unlabored Extremities- Warm and well-perfused  Neurological Examination Mental Status: Postictal lethargy. Speech is slow, sparse and hypophonic. Slowed mentation. Oriented to the year, city and state, but not to the day or month. No receptive or expressive aphasia noted in the context of her lethargy. Follows all simple commands. Dysarthria noted. Cranial Nerves: II: PERRL.  III,IV, VI: Slowly tracks to the right and left. No nystagmus. V:  Not cooperative VII: Grimace is without gross asymmetry VIII: Hearing intact to voice IX,X: Gag reflex deferred XI: Not cooperative XII: Midline tongue extension Motor: RUE: 5/5 LUE: 4+/5 RLE: 5/5 LLE: 4+/5 Sensory: FT grossly intact x 4. No extinction to DSS. Deep Tendon Reflexes: 1+ and symmetric bilateral brachioradialis and patellae Plantars: Right: downgoingLeft: downgoing Cerebellar: Not cooperative Gait: Unable to assess    Lab Results: Basic Metabolic Panel: Recent Labs  Lab 02/26/23 0926  NA 143  K 3.7  CL 108  CO2 26  GLUCOSE 107*  BUN 12  CREATININE 0.72  CALCIUM 9.4    CBC: Recent Labs  Lab 02/26/23 0926  WBC 12.5*  NEUTROABS 7.4  HGB 13.1  HCT 41.8  MCV 82.9  PLT 375    Cardiac Enzymes: Recent Labs  Lab 02/26/23 0926  CKTOTAL 151    Lipid Panel: No results for input(s): "CHOL", "TRIG", "HDL", "CHOLHDL", "VLDL", "LDLCALC" in the last 168 hours.  Imaging: CT Cervical Spine Wo Contrast  Result Date: 02/26/2023 CLINICAL DATA:  52 year old female status post witnessed seizure activity. History of medial right parietal lobe encephalomalacia and hemosiderin. EXAM: CT CERVICAL SPINE WITHOUT CONTRAST TECHNIQUE: Multidetector CT imaging of the cervical spine was performed without intravenous contrast. Multiplanar CT image reconstructions were also generated. RADIATION DOSE REDUCTION: This exam was performed according to the departmental dose-optimization program which includes automated exposure control, adjustment of the mA and/or kV according to patient size and/or use of iterative reconstruction technique. COMPARISON:  Head CT today.  Cervical spine CT 12/19/2011. FINDINGS: Alignment: Chronic straightening, mild reversal of cervical lordosis today. Cervicothoracic junction alignment is within normal limits. Bilateral posterior element alignment is within normal limits. Skull base and vertebrae: Bone mineralization is within normal limits. Visualized  skull base is intact. No atlanto-occipital dissociation. C1 and C2 appear intact and aligned. No acute osseous abnormality identified. Soft tissues and spinal canal: No prevertebral fluid or swelling. No visible canal hematoma. Negative visible noncontrast neck soft tissues. Disc levels: Mild for age cervical spine degeneration. No CT evidence of cervical spinal stenosis. Upper chest: Negative. IMPRESSION: 1. No acute traumatic injury identified in the cervical spine. 2. Mild for age cervical spine degeneration. Electronically Signed   By: Althea Grimmer.D.  On: 02/26/2023 10:47   CT HEAD WO CONTRAST ( )  Result Date: 02/26/2023 CLINICAL DATA:  52 year old female status post witnessed seizure activity. History of medial right parietal lobe encephalomalacia and hemosiderin. EXAM: CT HEAD WITHOUT CONTRAST TECHNIQUE: Contiguous axial images were obtained from the base of the skull through the vertex without intravenous contrast. RADIATION DOSE REDUCTION: This exam was performed according to the departmental dose-optimization program which includes automated exposure control, adjustment of the mA and/or kV according to patient size and/or use of iterative reconstruction technique. COMPARISON:  Brain MRI 01/19/2021, Head CT 09/08/2022. FINDINGS: Brain: Medial right parietal lobe encephalomalacia remains relatively subtle by CT (coronal image 44). Background brain volume remains normal. No superimposed No midline shift, ventriculomegaly, mass effect, evidence of mass lesion, intracranial hemorrhage or evidence of cortically based acute infarction. Gray-white differentiation remains within normal limits except for the encephalomalacia. Vascular: No suspicious intracranial vascular hyperdensity. Skull: Intact, negative. Sinuses/Orbits: Visualized paranasal sinuses and mastoids are stable and well aerated. Other: Visualized orbits and scalp soft tissues are within normal limits. IMPRESSION: No acute intracranial  abnormality. Stable non contrast CT appearance of the brain, negative aside from the chronic medial right parietal lobe encephalomalacia. Electronically Signed   By: Odessa Fleming M.D.   On: 02/26/2023 10:45   DG Chest Portable 1 View  Result Date: 02/26/2023 CLINICAL DATA:  Seizures. EXAM: PORTABLE CHEST 1 VIEW COMPARISON:  09/08/2022 and prior studies. FINDINGS: Cardiac silhouette is normal in size. Normal mediastinal and hilar contours. Clear lungs.  No pleural effusion or pneumothorax. Skeletal structures are grossly intact. IMPRESSION: No active disease. Electronically Signed   By: Amie Portland M.D.   On: 02/26/2023 10:10     Assessment: 52 year old female with a history of seizures and poor medication compliance presenting with status epilepticus, now resolved.  - Exam reveals postictal lethargy and mild Todd's paresis on the left.  - CT C-spine: No acute traumatic injury identified in the cervical spine. Mild for age cervical spine degeneration.  - CT head: Medial right parietal lobe encephalomalacia remains relatively subtle by CT (coronal image 44). Background brain volume remains normal. No superimposed hemorrhage or acute hypodensity.   EEG while asleep only: Normal.  - EKG: Sinus rhythm; Low voltage, precordial leads; RSR' in V1 or V2, right VCD or RVH - Presentation is most consistent with breakthrough seizures secondary to medication noncompliance, evolving to status epilepticus that resolved after administration of 2.5 mg IV Versed by EMS. Ativan 4 mg IV was administered in the ED after arrival.   Recommendations: - Has been loaded with 4000 mg IV Keppra - Continue Keppra XR at 2250 mg po qhs for now.  - Inpatient seizure precautions - Outpatient seizure precautions: Per [facility] statutes, patients with seizures are not allowed to drive until  they have been seizure-free for six months. Use caution when using heavy equipment or power tools. Avoid working on ladders or at  heights. Take showers instead of baths. Ensure the water temperature is not too high on the home water heater. Do not go swimming alone. When caring for infants or small children, sit down when holding, feeding, or changing them to minimize risk of injury to the child in the event you have a seizure. Also, Maintain good sleep hygiene. Avoid alcohol. - When patient is more awake, will need to interview her regarding her reasons for poor anticonvulsant compliance. If it is due to side effects, then call Neurology for further recommendations on switching to an  alternate anticonvulsant. If she is non-compliant due to poor organization, memory or lack of motivation, then will need to enlist husband's help to maintain compliance with her nightly Keppra dosing regimen. Transaminases are normal (total bilirubin is slightly low); valproic acid is therefore an option. Another option would be Lamictal. She is allergic to Topamax.    Electronically signed: Dr. Caryl Pina 02/26/2023, 11:11 AM

## 2023-02-26 NOTE — Progress Notes (Signed)
Eeg done 

## 2023-02-26 NOTE — ED Provider Notes (Signed)
[facility] Provider Note    Event Date/Time   First MD Initiated Contact with Patient 02/26/23 [phone]     (approximate)   History   Seizures   HPI  [name] is a 52 y.o. female with history of seizures who comes in with concerns for seizure activity.  Patient reports being on Keppra.  She reports intermittent dosing and not being compliant with it.  Patient reports that she takes it at nighttime only, and it is 3 tablets.  On review of records when she was admitted back in November 2023 for seizures it appears that she is on 2250 at bedtime.  Patient reportedly had 3 seizures in the car that were tonic-clonic not coming back to baseline.  When EMS got there she initially was not seizing but then had 2 witnessed seizing activity generalized tonic-clonic without return to baseline in between.  They both lasted about 30 seconds and aborted on their own but patient was given 2.5 of IV Versed..  On my evaluation patient is alert and oriented x 3 although does seem postictal moving all extremities.  She reports that she has not been taking her Keppra.  Unclear exactly why.  She does report a headache.   Physical Exam   Triage Vital Signs: ED Triage Vitals  Enc Vitals Group     BP      Pulse      Resp      Temp      Temp src      SpO2      Weight      Height      Head Circumference      Peak Flow      Pain Score      Pain Loc      Pain Edu?      Excl. in GC?     Most recent vital signs: Vitals:   02/26/23 0900 02/26/23 0930  BP: 116/73 127/73  Pulse: 82 89  Resp: 18 17  Temp: 98.2 F (36.8 C)   SpO2: 100% 100%     General: Awake, no distress.  CV:  Good peripheral perfusion.  Resp:  Normal effort.  Abd:  No distention.  Other:  No pronator drift noted, equal grip strength.  Does have some slight weakness in the left leg but still able to hold up both legs for 5 seconds..  Cranial nerves appear intact the patient does appear  drowsy.   ED Results / Procedures / Treatments   Labs (all labs ordered are listed, but only abnormal results are displayed) Labs Reviewed  CBC WITH DIFFERENTIAL/PLATELET  COMPREHENSIVE METABOLIC PANEL  URINALYSIS, ROUTINE W REFLEX MICROSCOPIC  CK  LEVETIRACETAM LEVEL     EKG  My interpretation of EKG:  Normal sinus rate of 85 without any ST elevation or T wave inversions, normal intervals  RADIOLOGY I have reviewed the xray personally and agree with radiology read   PROCEDURES:  Critical Care performed: Yes, see critical care procedure note(s)  .1-3 Lead EKG Interpretation  Performed by: Concha Se, MD Authorized by: Concha Se, MD     Interpretation: normal     ECG rate:  90   ECG rate assessment: normal     Rhythm: sinus rhythm     Ectopy: none     Conduction: normal   .Critical Care  Performed by: Concha Se, MD Authorized by: Concha Se, MD   Critical care provider statement:  Critical care time (minutes):  30   Critical care was necessary to treat or prevent imminent or life-threatening deterioration of the following conditions:  CNS failure or compromise   Critical care was time spent personally by me on the following activities:  Development of treatment plan with patient or surrogate, discussions with consultants, evaluation of patient's response to treatment, examination of patient, ordering and review of laboratory studies, ordering and review of radiographic studies, ordering and performing treatments and interventions, pulse oximetry, re-evaluation of patient's condition and review of old charts    MEDICATIONS ORDERED IN ED: Medications  LORazepam (ATIVAN) injection 2 mg (has no administration in time range)  sodium chloride 0.9 % bolus 1,000 mL (1,000 mLs Intravenous New Bag/Given 02/26/23 1035)  LORazepam (ATIVAN) injection 2 mg (2 mg Intravenous Given 02/26/23 0906)  levETIRAcetam (KEPPRA) IVPB 1000 mg/100 mL premix (0 mg Intravenous  Stopped 02/26/23 1003)    And  levETIRAcetam (KEPPRA) IVPB 1000 mg/100 mL premix (0 mg Intravenous Stopped 02/26/23 1024)    And  levETIRAcetam (KEPPRA) IVPB 1000 mg/100 mL premix (0 mg Intravenous Stopped 02/26/23 1024)    And  levETIRAcetam (KEPPRA) IVPB 1000 mg/100 mL premix (0 mg Intravenous Stopped 02/26/23 1143)  LORazepam (ATIVAN) injection 2 mg (2 mg Intravenous Given 02/26/23 0945)     IMPRESSION / MDM / ASSESSMENT AND PLAN / ED COURSE  I reviewed the triage vital signs and the nursing notes.   Patient's presentation is most consistent with acute presentation with potential threat to life or bodily function.   Most likely seizure secondary to medication noncompliance but labs ordered evaluate for Electra abdomens, AKI CT imaging ordered given she reports headache just to ensure no evidence of intracranial hemorrhage.  Nonfocal exam on first evaluation but patient is postictal.Slight weakness on the left but able to hold up for 5 seconds still.  Suspect mild TODds  does have prior h.o this. Will d/w neuro. Not TNK candidate given seizures and seems less likely stroke given multiple seizures off meds  9:18 AM patient had another witnessed tonic-clonic seizure by nurses last about 20 seconds.  No IV access and seizure of aborted prior to Ativan but patient was given 2 mg of IM Ativan.  Afterwards patient still following slight commands  This is concerning for status epilepticus given multiple seizure episodes without return to baseline will load with 4 g of IV Keppra-patient currently protecting airway no signs of additional seizure activity.  Will consult neuro   9:46 AM patient's family was in the room and reported that she had additional 2 seizures.  These were not videoed and not witnessed by staffing patient is still alert. Moving extremities and given additional 2 of IV Ativan at this time  Discussed with Dr Otelia Limes okay for admission here -getting a EEG. Prob mild Todds- does not need  MRI-  admit for seizures obs overnight- I reevaluated patient prior to admission and she is still following commands moving all extremities although does seem sleepy.  Do not feel that she is actively seizing.     The patient is on the cardiac monitor to evaluate for evidence of arrhythmia and/or significant heart rate changes.      FINAL CLINICAL IMPRESSION(S) / ED DIAGNOSES   Final diagnoses:  Seizure (HCC)  Status epilepticus (HCC)     Rx / DC Orders   ED Discharge Orders     None        Note:  This document was prepared using Dragon  voice recognition software and may include unintentional dictation errors.   Concha Se, MD 02/26/23 1201

## 2023-02-26 NOTE — Progress Notes (Signed)
       CROSS COVER NOTE  NAME: [name] MRN: [id number] DOB : 1970-11-27    HPI/Events of Note   Report:seizure activity on arrival to 2A unit. Bedside nurse said she had 5-10 second arm and leg shaking. Abated with prn ativan adminstered  On review of chart:Admitted with seizures likely from medication non compliance Neurology following    Assessment and  Interventions   Assessment: Cbg 107. Vitas stable 124/69 HR 95 rr 20 Sedated/post ictal  Plan: Continuous sat monitoring until fully awake Continue seizure precautions Notify me if not awake enough to take 2200 dose deppra      Donnie Mesa NP [facility]

## 2023-02-26 NOTE — ED Triage Notes (Signed)
Pt to ED via EMS for seizure activity. Pt had 3 in car before EMS arrival per family and 2 witnessed seizures with EMS. Pt is medication compliant but has missed some recent doses of Keppra. Pt is postictal but able to follow commands and answer questions. Pt denies recent falls.   2.5 mg Versed IN CBG 103 153/89

## 2023-02-27 DIAGNOSIS — D72829 Elevated white blood cell count, unspecified: Secondary | ICD-10-CM | POA: Diagnosis present

## 2023-02-27 DIAGNOSIS — G43409 Hemiplegic migraine, not intractable, without status migrainosus: Secondary | ICD-10-CM | POA: Diagnosis not present

## 2023-02-27 DIAGNOSIS — Z90710 Acquired absence of both cervix and uterus: Secondary | ICD-10-CM | POA: Diagnosis not present

## 2023-02-27 DIAGNOSIS — G9389 Other specified disorders of brain: Secondary | ICD-10-CM | POA: Diagnosis present

## 2023-02-27 DIAGNOSIS — Z6838 Body mass index (BMI) 38.0-38.9, adult: Secondary | ICD-10-CM | POA: Diagnosis not present

## 2023-02-27 DIAGNOSIS — R569 Unspecified convulsions: Secondary | ICD-10-CM | POA: Diagnosis present

## 2023-02-27 DIAGNOSIS — Z8673 Personal history of transient ischemic attack (TIA), and cerebral infarction without residual deficits: Secondary | ICD-10-CM | POA: Diagnosis not present

## 2023-02-27 DIAGNOSIS — R059 Cough, unspecified: Secondary | ICD-10-CM | POA: Diagnosis present

## 2023-02-27 DIAGNOSIS — T426X6A Underdosing of other antiepileptic and sedative-hypnotic drugs, initial encounter: Secondary | ICD-10-CM | POA: Diagnosis present

## 2023-02-27 DIAGNOSIS — Z823 Family history of stroke: Secondary | ICD-10-CM | POA: Diagnosis not present

## 2023-02-27 DIAGNOSIS — Z8249 Family history of ischemic heart disease and other diseases of the circulatory system: Secondary | ICD-10-CM | POA: Diagnosis not present

## 2023-02-27 DIAGNOSIS — Z803 Family history of malignant neoplasm of breast: Secondary | ICD-10-CM | POA: Diagnosis not present

## 2023-02-27 DIAGNOSIS — G40901 Epilepsy, unspecified, not intractable, with status epilepticus: Secondary | ICD-10-CM | POA: Diagnosis present

## 2023-02-27 DIAGNOSIS — E669 Obesity, unspecified: Secondary | ICD-10-CM | POA: Diagnosis present

## 2023-02-27 DIAGNOSIS — Z79899 Other long term (current) drug therapy: Secondary | ICD-10-CM | POA: Diagnosis not present

## 2023-02-27 DIAGNOSIS — Z91128 Patient's intentional underdosing of medication regimen for other reason: Secondary | ICD-10-CM | POA: Diagnosis not present

## 2023-02-27 DIAGNOSIS — Z880 Allergy status to penicillin: Secondary | ICD-10-CM | POA: Diagnosis not present

## 2023-02-27 DIAGNOSIS — F17210 Nicotine dependence, cigarettes, uncomplicated: Secondary | ICD-10-CM | POA: Diagnosis present

## 2023-02-27 DIAGNOSIS — I10 Essential (primary) hypertension: Secondary | ICD-10-CM | POA: Diagnosis present

## 2023-02-27 DIAGNOSIS — Z833 Family history of diabetes mellitus: Secondary | ICD-10-CM | POA: Diagnosis not present

## 2023-02-27 DIAGNOSIS — I69354 Hemiplegia and hemiparesis following cerebral infarction affecting left non-dominant side: Secondary | ICD-10-CM | POA: Diagnosis not present

## 2023-02-27 LAB — CBC
HCT: 37.5 % (ref 36.0–46.0)
Hemoglobin: 11.8 g/dL — ABNORMAL LOW (ref 12.0–15.0)
MCH: 25.9 pg — ABNORMAL LOW (ref 26.0–34.0)
MCHC: 31.5 g/dL (ref 30.0–36.0)
MCV: 82.4 fL (ref 80.0–100.0)
Platelets: 320 10*3/uL (ref 150–400)
RBC: 4.55 MIL/uL (ref 3.87–5.11)
RDW: 14.8 % (ref 11.5–15.5)
WBC: 16.5 10*3/uL — ABNORMAL HIGH (ref 4.0–10.5)
nRBC: 0 % (ref 0.0–0.2)

## 2023-02-27 MED ORDER — VALPROATE SODIUM 100 MG/ML IV SOLN
500.0000 mg | Freq: Three times a day (TID) | INTRAVENOUS | Status: DC
  Administered 2023-02-27: 500 mg via INTRAVENOUS
  Filled 2023-02-27 (×3): qty 5

## 2023-02-27 MED ORDER — VALPROATE SODIUM 100 MG/ML IV SOLN
2100.0000 mg | Freq: Once | INTRAVENOUS | Status: AC
  Administered 2023-02-27: 2100 mg via INTRAVENOUS
  Filled 2023-02-27: qty 21

## 2023-02-27 NOTE — Progress Notes (Signed)
Subjective: The patient had 2 breakthrough seizures after waking up this morning, each GTC and lasting for about 30 seconds. She is now postictal.   Objective: Current vital signs: BP 125/72 (BP Location: Right Arm)   Pulse 89   Temp 97.8 F (36.6 C)   Resp 16   Wt 105.4 kg   SpO2 100%   BMI 38.67 kg/m  Vital signs in last 24 hours: Temp:  [97.6 F (36.4 C)-98.2 F (36.8 C)] 97.8 F (36.6 C) (05/04 1031) Pulse Rate:  [80-95] 89 (05/04 1031) Resp:  [16-20] 16 (05/04 1031) BP: (100-125)/(48-81) 125/72 (05/04 1031) SpO2:  [96 %-100 %] 100 % (05/04 1031) Weight:  [105.4 kg] 105.4 kg (05/03 1205)  Intake/Output from previous day: 05/03 0701 - 05/04 0700 In: 480 [P.O.:480] Out: -  Intake/Output this shift: No intake/output data recorded. Nutritional status:  Diet Order             Diet heart healthy/carb modified Fluid consistency: Thin  Diet effective now                  HEENT: [place]/AT Lungs: Respirations unlabored   Neurologic Exam: Ment: Postictal lethargy. Will answer questions about how she is feeling. Poorly cooperative with exam.  CN: EOMI. Face symmetric. Phonation intact.  Motor: Does not participate in exam Sensory: Does not participate in exam  Lab Results: Results for orders placed or performed during the hospital encounter of 02/26/23 (from the past 48 hour(s))  CBC with Differential     Status: Abnormal   Collection Time: 02/26/23  9:26 AM  Result Value Ref Range   WBC 12.5 (H) 4.0 - 10.5 K/uL   RBC 5.04 3.87 - 5.11 MIL/uL   Hemoglobin 13.1 12.0 - 15.0 g/dL   HCT 16.1 09.6 - 04.5 %   MCV 82.9 80.0 - 100.0 fL   MCH 26.0 26.0 - 34.0 pg   MCHC 31.3 30.0 - 36.0 g/dL   RDW 40.9 81.1 - 91.4 %   Platelets 375 150 - 400 K/uL   nRBC 0.0 0.0 - 0.2 %   Neutrophils Relative % 59 %   Neutro Abs 7.4 1.7 - 7.7 K/uL   Lymphocytes Relative 34 %   Lymphs Abs 4.2 (H) 0.7 - 4.0 K/uL   Monocytes Relative 5 %   Monocytes Absolute 0.6 0.1 - 1.0 K/uL    Eosinophils Relative 2 %   Eosinophils Absolute 0.2 0.0 - 0.5 K/uL   Basophils Relative 0 %   Basophils Absolute 0.1 0.0 - 0.1 K/uL   Immature Granulocytes 0 %   Abs Immature Granulocytes 0.04 0.00 - 0.07 K/uL    Comment: Performed at [facility], [address] [place], [place] [address]  Comprehensive metabolic panel     Status: Abnormal   Collection Time: 02/26/23  9:26 AM  Result Value Ref Range   Sodium 143 135 - 145 mmol/L   Potassium 3.7 3.5 - 5.1 mmol/L   Chloride 108 98 - 111 mmol/L   CO2 26 22 - 32 mmol/L   Glucose, Bld 107 (H) 70 - 99 mg/dL    Comment: Glucose reference range applies only to samples taken after fasting for at least 8 hours.   BUN 12 6 - 20 mg/dL   Creatinine, Ser 6.21 0.44 - 1.00 mg/dL   Calcium 9.4 8.9 - 30.8 mg/dL   Total Protein 7.5 6.5 - 8.1 g/dL   Albumin 3.8 3.5 - 5.0 g/dL   AST 22 15 -  41 U/L   ALT 19 0 - 44 U/L   Alkaline Phosphatase 83 38 - 126 U/L   Total Bilirubin 0.2 (L) 0.3 - 1.2 mg/dL   GFR, Estimated >32 >44 mL/min    Comment: (NOTE) Calculated using the CKD-EPI Creatinine Equation (2021)    Anion gap 9 5 - 15    Comment: Performed at [facility], [address] [place], [place] [address]  CK     Status: None   Collection Time: 02/26/23  9:26 AM  Result Value Ref Range   Total CK 151 38 - 234 U/L    Comment: Performed at [facility], [address] [place], [place] [address]  Urinalysis, Routine w reflex microscopic -Urine, Clean Catch     Status: Abnormal   Collection Time: 02/26/23  9:45 AM  Result Value Ref Range   Color, Urine YELLOW (A) YELLOW   APPearance HAZY (A) CLEAR   Specific Gravity, Urine 1.008 1.005 - 1.030   pH 5.0 5.0 - 8.0   Glucose, UA NEGATIVE NEGATIVE mg/dL   Hgb urine dipstick LARGE (A) NEGATIVE   Bilirubin Urine NEGATIVE NEGATIVE   Ketones, ur NEGATIVE NEGATIVE mg/dL   Protein, ur NEGATIVE NEGATIVE mg/dL   Nitrite NEGATIVE NEGATIVE   Leukocytes,Ua TRACE (A) NEGATIVE    RBC / HPF 6-10 0 - 5 RBC/hpf   WBC, UA 6-10 0 - 5 WBC/hpf   Bacteria, UA RARE (A) NONE SEEN   Squamous Epithelial / HPF 11-20 0 - 5 /HPF   Mucus PRESENT     Comment: Performed at [facility], [address] [place], [place] [address]  Urine Drug Screen, Qualitative ([facility] only)     Status: None   Collection Time: 02/26/23  9:51 AM  Result Value Ref Range   Tricyclic, Ur Screen NONE DETECTED NONE DETECTED   Amphetamines, Ur Screen NONE DETECTED NONE DETECTED   MDMA (Ecstasy)Ur Screen NONE DETECTED NONE DETECTED   Cocaine Metabolite,Ur [place] NONE DETECTED NONE DETECTED   Opiate, Ur Screen NONE DETECTED NONE DETECTED   Phencyclidine (PCP) Ur S NONE DETECTED NONE DETECTED   Cannabinoid 50 Ng, Ur [place] NONE DETECTED NONE DETECTED   Barbiturates, Ur Screen NONE DETECTED NONE DETECTED   Benzodiazepine, Ur Scrn NONE DETECTED NONE DETECTED   Methadone Scn, Ur NONE DETECTED NONE DETECTED    Comment: (NOTE) Tricyclics + metabolites, urine    Cutoff 1000 ng/mL Amphetamines + metabolites, urine  Cutoff 1000 ng/mL MDMA (Ecstasy), urine              Cutoff 500 ng/mL Cocaine Metabolite, urine          Cutoff 300 ng/mL Opiate + metabolites, urine        Cutoff 300 ng/mL Phencyclidine (PCP), urine         Cutoff 25 ng/mL Cannabinoid, urine                 Cutoff 50 ng/mL Barbiturates + metabolites, urine  Cutoff 200 ng/mL Benzodiazepine, urine              Cutoff 200 ng/mL Methadone, urine                   Cutoff 300 ng/mL  The urine drug screen provides only a preliminary, unconfirmed analytical test result and should not be used for non-medical purposes. Clinical consideration and professional judgment should be applied to any positive drug screen result due to possible interfering substances. A more specific alternate  chemical method must be used in order to obtain a confirmed analytical result. Gas chromatography / mass spectrometry (GC/MS) is the preferred confirm atory  method. Performed at [facility], [address] [place], [place] [address]   Pregnancy, urine     Status: None   Collection Time: 02/26/23  9:51 AM  Result Value Ref Range   Preg Test, Ur NEGATIVE NEGATIVE    Comment: Performed at [facility], [address] [place], [place] [address]  Glucose, capillary     Status: Abnormal   Collection Time: 02/26/23  9:01 PM  Result Value Ref Range   Glucose-Capillary 107 (H) 70 - 99 mg/dL    Comment: Glucose reference range applies only to samples taken after fasting for at least 8 hours.  CBC     Status: Abnormal   Collection Time: 02/27/23  4:02 AM  Result Value Ref Range   WBC 16.5 (H) 4.0 - 10.5 K/uL   RBC 4.55 3.87 - 5.11 MIL/uL   Hemoglobin 11.8 (L) 12.0 - 15.0 g/dL   HCT 09.8 11.9 - 14.7 %   MCV 82.4 80.0 - 100.0 fL   MCH 25.9 (L) 26.0 - 34.0 pg   MCHC 31.5 30.0 - 36.0 g/dL   RDW 82.9 56.2 - 13.0 %   Platelets 320 150 - 400 K/uL   nRBC 0.0 0.0 - 0.2 %    Comment: Performed at [facility], [address] [place], [place] [address]    No results found for this or any previous visit (from the past 240 hour(s)).  Lipid Panel No results for input(s): "CHOL", "TRIG", "HDL", "CHOLHDL", "VLDL", "LDLCALC" in the last 72 hours.  Studies/Results: EEG adult  Result Date: 02/26/2023 Charlsie Quest, MD     02/26/2023  1:50 PM Patient Name: [name] MRN: [id number] Epilepsy Attending: Charlsie Quest Referring Physician/Provider: Concha Se, MD Date: 02/26/2023 Duration: 25.19 mins Patient history: 52 y.o. female with history of seizures who comes in with concerns for seizure activity. EEG to evaluate for seizure Level of alertness: Asleep AEDs during EEG study: Ativan Technical aspects: This EEG study was done with scalp electrodes positioned according to the 10-20 International system of electrode placement. Electrical activity was reviewed with band pass filter of 1-70Hz , sensitivity of 7 uV/mm, display  speed of 4mm/sec with a 60Hz  notched filter applied as appropriate. EEG data were recorded continuously and digitally stored.  Video monitoring was available and reviewed as appropriate. Description: Sleep was characterized by vertex waves, sleep spindles (12 to 14 Hz), maximal frontocentral region.Hyperventilation and photic stimulation were not performed.   IMPRESSION: This study during sleep only is within normal limits. No seizures or epileptiform discharges were seen throughout the recording. A normal interictal EEG does not exclude the diagnosis of epilepsy. Charlsie Quest   CT Cervical Spine Wo Contrast  Result Date: 02/26/2023 CLINICAL DATA:  52 year old female status post witnessed seizure activity. History of medial right parietal lobe encephalomalacia and hemosiderin. EXAM: CT CERVICAL SPINE WITHOUT CONTRAST TECHNIQUE: Multidetector CT imaging of the cervical spine was performed without intravenous contrast. Multiplanar CT image reconstructions were also generated. RADIATION DOSE REDUCTION: This exam was performed according to the departmental dose-optimization program which includes automated exposure control, adjustment of the mA and/or kV according to patient size and/or use of iterative reconstruction technique. COMPARISON:  Head CT today.  Cervical spine CT 12/19/2011. FINDINGS: Alignment: Chronic straightening, mild reversal of cervical lordosis today. Cervicothoracic junction alignment is within  normal limits. Bilateral posterior element alignment is within normal limits. Skull base and vertebrae: Bone mineralization is within normal limits. Visualized skull base is intact. No atlanto-occipital dissociation. C1 and C2 appear intact and aligned. No acute osseous abnormality identified. Soft tissues and spinal canal: No prevertebral fluid or swelling. No visible canal hematoma. Negative visible noncontrast neck soft tissues. Disc levels: Mild for age cervical spine degeneration. No CT evidence  of cervical spinal stenosis. Upper chest: Negative. IMPRESSION: 1. No acute traumatic injury identified in the cervical spine. 2. Mild for age cervical spine degeneration. Electronically Signed   By: Odessa Fleming M.D.   On: 02/26/2023 10:47   CT HEAD WO CONTRAST ( )  Result Date: 02/26/2023 CLINICAL DATA:  52 year old female status post witnessed seizure activity. History of medial right parietal lobe encephalomalacia and hemosiderin. EXAM: CT HEAD WITHOUT CONTRAST TECHNIQUE: Contiguous axial images were obtained from the base of the skull through the vertex without intravenous contrast. RADIATION DOSE REDUCTION: This exam was performed according to the departmental dose-optimization program which includes automated exposure control, adjustment of the mA and/or kV according to patient size and/or use of iterative reconstruction technique. COMPARISON:  Brain MRI 01/19/2021, Head CT 09/08/2022. FINDINGS: Brain: Medial right parietal lobe encephalomalacia remains relatively subtle by CT (coronal image 44). Background brain volume remains normal. No superimposed No midline shift, ventriculomegaly, mass effect, evidence of mass lesion, intracranial hemorrhage or evidence of cortically based acute infarction. Gray-white differentiation remains within normal limits except for the encephalomalacia. Vascular: No suspicious intracranial vascular hyperdensity. Skull: Intact, negative. Sinuses/Orbits: Visualized paranasal sinuses and mastoids are stable and well aerated. Other: Visualized orbits and scalp soft tissues are within normal limits. IMPRESSION: No acute intracranial abnormality. Stable non contrast CT appearance of the brain, negative aside from the chronic medial right parietal lobe encephalomalacia. Electronically Signed   By: Odessa Fleming M.D.   On: 02/26/2023 10:45   DG Chest Portable 1 View  Result Date: 02/26/2023 CLINICAL DATA:  Seizures. EXAM: PORTABLE CHEST 1 VIEW COMPARISON:  09/08/2022 and prior studies.  FINDINGS: Cardiac silhouette is normal in size. Normal mediastinal and hilar contours. Clear lungs.  No pleural effusion or pneumothorax. Skeletal structures are grossly intact. IMPRESSION: No active disease. Electronically Signed   By: Amie Portland M.D.   On: 02/26/2023 10:10    Medications: Scheduled:  enoxaparin (LOVENOX) injection  0.5 mg/kg Subcutaneous Q24H   levETIRAcetam  2,250 mg Oral QHS   lisinopril  10 mg Oral Daily   nicotine  21 mg Transdermal Daily   Rimegepant Sulfate  75 mg Oral QODAY   Assessment: 52 year old female with a history of seizures and poor medication compliance who presented yesterday with status epilepticus, which resolved with benzodiazepines and IV Keppra load.  - Exam today reveals postictal lethargy. The patient is not cooperative with exam other than answering questions.  - CT head: Medial right parietal lobe encephalomalacia remains relatively subtle by CT (coronal image 44). Background brain volume remains normal. No superimposed hemorrhage or acute hypodensity.  - EEG while asleep only: Normal.  - Presentation is most consistent with breakthrough seizures secondary to medication noncompliance, evolving to status epilepticus that resolved after administration of 2.5 mg IV Versed by EMS. Ativan 4 mg IV was administered in the ED after arrival.  - The patient states this morning that she has not been taking her Keppra due to side effect of drowsiness, which prevents her from working at her job effectively. Keppra has not been effective at  controlling her seizures this admission. She previously has taken Depakote for migraine prophylaxis and is willing to be switched from Keppra to valproic acid for her anticonvulsant regimen. She was counseled that she should not become pregnant while on this medication; she states that she has no plans to become pregnant. She states that she will be compliant with the TID valproic acid dosing. Husband also agrees with the plan. She  is awake, alert and attentive enough to provide informed consent.    Recommendations: - Load valproic acid 20 mg/kg now (ordered).  - Continue scheduled valproic acid at 5 mg/kg IV TID (ordered IV for now) - Discontinuing Keppra - Inpatient seizure precautions - Outpatient seizure precautions: Per [facility] statutes, patients with seizures are not allowed to drive until  they have been seizure-free for six months. Use caution when using heavy equipment or power tools. Avoid working on ladders or at heights. Take showers instead of baths. Ensure the water temperature is not too high on the home water heater. Do not go swimming alone. When caring for infants or small children, sit down when holding, feeding, or changing them to minimize risk of injury to the child in the event you have a seizure. Also, Maintain good sleep hygiene. Avoid alcohol.    LOS: 0 days   @Electronically  signed: Dr. Caryl Pina 02/27/2023  12:05 PM

## 2023-02-27 NOTE — Progress Notes (Signed)
Patient arrived to unit at approximately 2040 and transferred herself from stretcher to bed. At approximately 2046, patient experienced ~10-15 second seizure where she became unresponsive and had arm and leg tremors. On-call provider notified and 2mg  ativan administered. Vital signs stable and blood glucose 107. Patient oriented, but drowsy.

## 2023-02-27 NOTE — Progress Notes (Signed)
  Progress Note   Patient: [name] ZOX:[id number] DOB: 1971-10-18 DOA: 02/26/2023     0 DOS: the patient was seen and examined on 02/27/2023   Brief hospital course: CHANAE SYBESMA is a 52 y.o. female with medical history significant of seizure, hypertension, stroke, hemiplegic migraine, brain aneurysm, who presents with seizure  Patient has not been compliant with her seizure treatment, after arriving the hospital, she was loaded with Keppra, seen by neurology, EEG did not show any seizure activity. Patient had an additional 2 episodes of seizure in the morning of 5/4.  She was drowsy postictal.   Principal Problem:   Seizure (HCC) Active Problems:   Hemiplegic migraine   Hx of cerebral infarction   Leukocytosis   Tobacco abuse   Obesity (BMI 30-39.9)   Assessment and Plan: Seizure [facility]): \ Patient is noncompliant with treatment.  She has been loaded with Keppra through IV, continued on home dose Keppra.  Patient had additional 2 episodes of seizure this morning, Dr. Otelia Limes is aware, needed additional seizure medicine. Patient is still sleepy today, not able to discharge.   Hemiplegic migraine Nurtec 75 mg every other day   Hx of cerebral infarction: Patient is not taking aspirin or statin currently Currently patient does not have recurrence of stroke.  Will continue to follow.   Tobacco abuse Advised to quit.   Obesity (BMI 30-39.9): Body weight 105.4 kg, BMI 38.67 Diet and exercise will be encouraged.         Subjective:  Patient had 2 episodes of seizure earlier this morning, received Ativan.  Still drowsy.  Physical Exam: Vitals:   02/26/23 2315 02/27/23 0537 02/27/23 0824 02/27/23 1031  BP: (!) 102/48 122/73 124/81 125/72  Pulse: 94 80 92 89  Resp: 18 18 16 16   Temp: 98 F (36.7 C) 97.6 F (36.4 C) 97.6 F (36.4 C) 97.8 F (36.6 C)  TempSrc:      SpO2: 99% 99% 100% 100%  Weight:       General exam: Appears calm and comfortable,  obese. Respiratory system: Clear to auscultation. Respiratory effort normal. Cardiovascular system: S1 & S2 heard, RRR. No JVD, murmurs, rubs, gallops or clicks. No pedal edema. Gastrointestinal system: Abdomen is nondistended, soft and nontender. No organomegaly or masses felt. Normal bowel sounds heard. Central nervous system: Drowsy and oriented x3. No focal neurological deficits. Extremities: Symmetric 5 x 5 power. Skin: No rashes, lesions or ulcers Psychiatry: Judgement and insight appear normal. Mood & affect appropriate.    Data Reviewed:  CT head results lab results reviewed.  Family Communication: Husband updated at the bedside.  Disposition: Status is: Observation      Time spent: 35 minutes  Author: Marrion Coy, MD 02/27/2023 11:14 AM  For on call review [email or web address].

## 2023-02-27 NOTE — Plan of Care (Signed)

## 2023-02-27 NOTE — Progress Notes (Signed)
10:33 am patient had another seizure husband and family member at bedside, unresponsive with jerking of arms and legs for about 30 seconds, ativan 2mg  was given IV. MD notified. Vitals were stable. Vomited with little blood on saliva zofran IV was given.

## 2023-02-27 NOTE — Hospital Course (Signed)
[name] is a 52 y.o. female with medical history significant of seizure, hypertension, stroke, hemiplegic migraine, brain aneurysm, who presents with seizure  Patient has not been compliant with her seizure treatment, after arriving the hospital, she was loaded with Keppra, seen by neurology, EEG did not show any seizure activity. Patient had an additional 2 episodes of seizure in the morning of 5/4.  She was drowsy postictal.

## 2023-02-27 NOTE — Progress Notes (Signed)
About 8:20 am Patient had 30 seconds seizure of arms and legs tremors, vitals normal, seen by MD seizure stopped advised not to give ativan. Unable to pullout ativan in pyxis power was out.

## 2023-02-28 DIAGNOSIS — E669 Obesity, unspecified: Secondary | ICD-10-CM

## 2023-02-28 MED ORDER — DIVALPROEX SODIUM 500 MG PO DR TAB: 500.0000 mg | DELAYED_RELEASE_TABLET | Freq: Three times a day (TID) | ORAL | 0 refills | Status: DC

## 2023-02-28 MED ORDER — DIVALPROEX SODIUM 500 MG PO DR TAB
500.0000 mg | DELAYED_RELEASE_TABLET | Freq: Three times a day (TID) | ORAL | Status: DC
  Administered 2023-02-28 (×2): 500 mg via ORAL
  Filled 2023-02-28 (×2): qty 1

## 2023-02-28 NOTE — Progress Notes (Signed)
Subjective: No seizures since starting valproic acid. Awake and cooperative this morning.   Objective: Current vital signs: BP 118/68 (BP Location: Right Arm)   Pulse 81   Temp 97.8 F (36.6 C) (Oral)   Resp 18   Wt 105.4 kg   SpO2 100%   BMI 38.67 kg/m  Vital signs in last 24 hours: Temp:  [97.7 F (36.5 C)-98.4 F (36.9 C)] 97.8 F (36.6 C) (05/05 0734) Pulse Rate:  [75-89] 81 (05/05 0734) Resp:  [16-22] 18 (05/05 0734) BP: (102-125)/(52-72) 118/68 (05/05 0734) SpO2:  [95 %-100 %] 100 % (05/05 0734)  Intake/Output from previous day: 05/04 0701 - 05/05 0700 In: 431 [P.O.:360; IV Piggyback:71] Out: -  Intake/Output this shift: No intake/output data recorded. Nutritional status:  Diet Order             Diet heart healthy/carb modified Fluid consistency: Thin  Diet effective now                  HEENT: [place]/AT Lungs: Respirations unlabored Ext: No edema  Neurologic Exam: Ment: Awake with mildly decreased level of alertness. Oriented to the year, day, city and state but not the month ("April"). Speech is slightly slowed but fluent and nondysarthric with intact comprehension.  CN: Fixates and tracks normally. Face symmetric. Phonation intact.  Motor: 5/5 BUE 5/5 RLE.  4/5 LLE with movement compromised by hip pain.  Sensory: Reacts to touch x 4 Cerebellar: Intact FNF bilaterally  Lab Results: Results for orders placed or performed during the hospital encounter of 02/26/23 (from the past 48 hour(s))  CBC with Differential     Status: Abnormal   Collection Time: 02/26/23  9:26 AM  Result Value Ref Range   WBC 12.5 (H) 4.0 - 10.5 K/uL   RBC 5.04 3.87 - 5.11 MIL/uL   Hemoglobin 13.1 12.0 - 15.0 g/dL   HCT 16.1 09.6 - 04.5 %   MCV 82.9 80.0 - 100.0 fL   MCH 26.0 26.0 - 34.0 pg   MCHC 31.3 30.0 - 36.0 g/dL   RDW 40.9 81.1 - 91.4 %   Platelets 375 150 - 400 K/uL   nRBC 0.0 0.0 - 0.2 %   Neutrophils Relative % 59 %   Neutro Abs 7.4 1.7 - 7.7 K/uL   Lymphocytes  Relative 34 %   Lymphs Abs 4.2 (H) 0.7 - 4.0 K/uL   Monocytes Relative 5 %   Monocytes Absolute 0.6 0.1 - 1.0 K/uL   Eosinophils Relative 2 %   Eosinophils Absolute 0.2 0.0 - 0.5 K/uL   Basophils Relative 0 %   Basophils Absolute 0.1 0.0 - 0.1 K/uL   Immature Granulocytes 0 %   Abs Immature Granulocytes 0.04 0.00 - 0.07 K/uL    Comment: Performed at [facility], [address] [place], [place] [address]  Comprehensive metabolic panel     Status: Abnormal   Collection Time: 02/26/23  9:26 AM  Result Value Ref Range   Sodium 143 135 - 145 mmol/L   Potassium 3.7 3.5 - 5.1 mmol/L   Chloride 108 98 - 111 mmol/L   CO2 26 22 - 32 mmol/L   Glucose, Bld 107 (H) 70 - 99 mg/dL    Comment: Glucose reference range applies only to samples taken after fasting for at least 8 hours.   BUN 12 6 - 20 mg/dL   Creatinine, Ser 6.21 0.44 - 1.00 mg/dL   Calcium 9.4 8.9 - 30.8 mg/dL   Total Protein 7.5  6.5 - 8.1 g/dL   Albumin 3.8 3.5 - 5.0 g/dL   AST 22 15 - 41 U/L   ALT 19 0 - 44 U/L   Alkaline Phosphatase 83 38 - 126 U/L   Total Bilirubin 0.2 (L) 0.3 - 1.2 mg/dL   GFR, Estimated >16 >10 mL/min    Comment: (NOTE) Calculated using the CKD-EPI Creatinine Equation (2021)    Anion gap 9 5 - 15    Comment: Performed at [facility], [address] [place], [place] [address]  CK     Status: None   Collection Time: 02/26/23  9:26 AM  Result Value Ref Range   Total CK 151 38 - 234 U/L    Comment: Performed at [facility], [address] [place], [place] [address]  Urinalysis, Routine w reflex microscopic -Urine, Clean Catch     Status: Abnormal   Collection Time: 02/26/23  9:45 AM  Result Value Ref Range   Color, Urine YELLOW (A) YELLOW   APPearance HAZY (A) CLEAR   Specific Gravity, Urine 1.008 1.005 - 1.030   pH 5.0 5.0 - 8.0   Glucose, UA NEGATIVE NEGATIVE mg/dL   Hgb urine dipstick LARGE (A) NEGATIVE   Bilirubin Urine NEGATIVE NEGATIVE   Ketones, ur NEGATIVE  NEGATIVE mg/dL   Protein, ur NEGATIVE NEGATIVE mg/dL   Nitrite NEGATIVE NEGATIVE   Leukocytes,Ua TRACE (A) NEGATIVE   RBC / HPF 6-10 0 - 5 RBC/hpf   WBC, UA 6-10 0 - 5 WBC/hpf   Bacteria, UA RARE (A) NONE SEEN   Squamous Epithelial / HPF 11-20 0 - 5 /HPF   Mucus PRESENT     Comment: Performed at [facility], [address] [place], [place] [address]  Urine Drug Screen, Qualitative ([facility] only)     Status: None   Collection Time: 02/26/23  9:51 AM  Result Value Ref Range   Tricyclic, Ur Screen NONE DETECTED NONE DETECTED   Amphetamines, Ur Screen NONE DETECTED NONE DETECTED   MDMA (Ecstasy)Ur Screen NONE DETECTED NONE DETECTED   Cocaine Metabolite,Ur [place] NONE DETECTED NONE DETECTED   Opiate, Ur Screen NONE DETECTED NONE DETECTED   Phencyclidine (PCP) Ur S NONE DETECTED NONE DETECTED   Cannabinoid 50 Ng, Ur [place] NONE DETECTED NONE DETECTED   Barbiturates, Ur Screen NONE DETECTED NONE DETECTED   Benzodiazepine, Ur Scrn NONE DETECTED NONE DETECTED   Methadone Scn, Ur NONE DETECTED NONE DETECTED    Comment: (NOTE) Tricyclics + metabolites, urine    Cutoff 1000 ng/mL Amphetamines + metabolites, urine  Cutoff 1000 ng/mL MDMA (Ecstasy), urine              Cutoff 500 ng/mL Cocaine Metabolite, urine          Cutoff 300 ng/mL Opiate + metabolites, urine        Cutoff 300 ng/mL Phencyclidine (PCP), urine         Cutoff 25 ng/mL Cannabinoid, urine                 Cutoff 50 ng/mL Barbiturates + metabolites, urine  Cutoff 200 ng/mL Benzodiazepine, urine              Cutoff 200 ng/mL Methadone, urine                   Cutoff 300 ng/mL  The urine drug screen provides only a preliminary, unconfirmed analytical test result and should not be used for non-medical purposes. Clinical consideration and professional judgment  should be applied to any positive drug screen result due to possible interfering substances. A more specific alternate chemical method must be used in order to obtain a  confirmed analytical result. Gas chromatography / mass spectrometry (GC/MS) is the preferred confirm atory method. Performed at [facility], [address] [place], [place] [address]   Pregnancy, urine     Status: None   Collection Time: 02/26/23  9:51 AM  Result Value Ref Range   Preg Test, Ur NEGATIVE NEGATIVE    Comment: Performed at [facility], [address] [place], [place] [address]  Glucose, capillary     Status: Abnormal   Collection Time: 02/26/23  9:01 PM  Result Value Ref Range   Glucose-Capillary 107 (H) 70 - 99 mg/dL    Comment: Glucose reference range applies only to samples taken after fasting for at least 8 hours.  CBC     Status: Abnormal   Collection Time: 02/27/23  4:02 AM  Result Value Ref Range   WBC 16.5 (H) 4.0 - 10.5 K/uL   RBC 4.55 3.87 - 5.11 MIL/uL   Hemoglobin 11.8 (L) 12.0 - 15.0 g/dL   HCT 09.8 11.9 - 14.7 %   MCV 82.4 80.0 - 100.0 fL   MCH 25.9 (L) 26.0 - 34.0 pg   MCHC 31.5 30.0 - 36.0 g/dL   RDW 82.9 56.2 - 13.0 %   Platelets 320 150 - 400 K/uL   nRBC 0.0 0.0 - 0.2 %    Comment: Performed at [facility], [address] [place], [place] [address]    No results found for this or any previous visit (from the past 240 hour(s)).  Lipid Panel No results for input(s): "CHOL", "TRIG", "HDL", "CHOLHDL", "VLDL", "LDLCALC" in the last 72 hours.  Studies/Results: EEG adult  Result Date: 02/26/2023 Charlsie Quest, MD     02/26/2023  1:50 PM Patient Name: ELYCIA ROTHSCHILD MRN: [id number] Epilepsy Attending: Charlsie Quest Referring Physician/Provider: Concha Se, MD Date: 02/26/2023 Duration: 25.19 mins Patient history: 52 y.o. female with history of seizures who comes in with concerns for seizure activity. EEG to evaluate for seizure Level of alertness: Asleep AEDs during EEG study: Ativan Technical aspects: This EEG study was done with scalp electrodes positioned according to the 10-20 International system of electrode  placement. Electrical activity was reviewed with band pass filter of 1-70Hz , sensitivity of 7 uV/mm, display speed of 22mm/sec with a 60Hz  notched filter applied as appropriate. EEG data were recorded continuously and digitally stored.  Video monitoring was available and reviewed as appropriate. Description: Sleep was characterized by vertex waves, sleep spindles (12 to 14 Hz), maximal frontocentral region.Hyperventilation and photic stimulation were not performed.   IMPRESSION: This study during sleep only is within normal limits. No seizures or epileptiform discharges were seen throughout the recording. A normal interictal EEG does not exclude the diagnosis of epilepsy. Charlsie Quest   CT Cervical Spine Wo Contrast  Result Date: 02/26/2023 CLINICAL DATA:  52 year old female status post witnessed seizure activity. History of medial right parietal lobe encephalomalacia and hemosiderin. EXAM: CT CERVICAL SPINE WITHOUT CONTRAST TECHNIQUE: Multidetector CT imaging of the cervical spine was performed without intravenous contrast. Multiplanar CT image reconstructions were also generated. RADIATION DOSE REDUCTION: This exam was performed according to the departmental dose-optimization program which includes automated exposure control, adjustment of the mA and/or kV according to patient size and/or use of iterative reconstruction technique. COMPARISON:  Head CT today.  Cervical  spine CT 12/19/2011. FINDINGS: Alignment: Chronic straightening, mild reversal of cervical lordosis today. Cervicothoracic junction alignment is within normal limits. Bilateral posterior element alignment is within normal limits. Skull base and vertebrae: Bone mineralization is within normal limits. Visualized skull base is intact. No atlanto-occipital dissociation. C1 and C2 appear intact and aligned. No acute osseous abnormality identified. Soft tissues and spinal canal: No prevertebral fluid or swelling. No visible canal hematoma. Negative  visible noncontrast neck soft tissues. Disc levels: Mild for age cervical spine degeneration. No CT evidence of cervical spinal stenosis. Upper chest: Negative. IMPRESSION: 1. No acute traumatic injury identified in the cervical spine. 2. Mild for age cervical spine degeneration. Electronically Signed   By: Odessa Fleming M.D.   On: 02/26/2023 10:47   CT HEAD WO CONTRAST ( )  Result Date: 02/26/2023 CLINICAL DATA:  52 year old female status post witnessed seizure activity. History of medial right parietal lobe encephalomalacia and hemosiderin. EXAM: CT HEAD WITHOUT CONTRAST TECHNIQUE: Contiguous axial images were obtained from the base of the skull through the vertex without intravenous contrast. RADIATION DOSE REDUCTION: This exam was performed according to the departmental dose-optimization program which includes automated exposure control, adjustment of the mA and/or kV according to patient size and/or use of iterative reconstruction technique. COMPARISON:  Brain MRI 01/19/2021, Head CT 09/08/2022. FINDINGS: Brain: Medial right parietal lobe encephalomalacia remains relatively subtle by CT (coronal image 44). Background brain volume remains normal. No superimposed No midline shift, ventriculomegaly, mass effect, evidence of mass lesion, intracranial hemorrhage or evidence of cortically based acute infarction. Gray-white differentiation remains within normal limits except for the encephalomalacia. Vascular: No suspicious intracranial vascular hyperdensity. Skull: Intact, negative. Sinuses/Orbits: Visualized paranasal sinuses and mastoids are stable and well aerated. Other: Visualized orbits and scalp soft tissues are within normal limits. IMPRESSION: No acute intracranial abnormality. Stable non contrast CT appearance of the brain, negative aside from the chronic medial right parietal lobe encephalomalacia. Electronically Signed   By: Odessa Fleming M.D.   On: 02/26/2023 10:45   DG Chest Portable 1 View  Result Date:  02/26/2023 CLINICAL DATA:  Seizures. EXAM: PORTABLE CHEST 1 VIEW COMPARISON:  09/08/2022 and prior studies. FINDINGS: Cardiac silhouette is normal in size. Normal mediastinal and hilar contours. Clear lungs.  No pleural effusion or pneumothorax. Skeletal structures are grossly intact. IMPRESSION: No active disease. Electronically Signed   By: Amie Portland M.D.   On: 02/26/2023 10:10    Medications: Scheduled:  enoxaparin (LOVENOX) injection  0.5 mg/kg Subcutaneous Q24H   lisinopril  10 mg Oral Daily   nicotine  21 mg Transdermal Daily   Rimegepant Sulfate  75 mg Oral QODAY   Continuous:  valproate sodium 500 mg (02/27/23 2201)    Assessment: 52 year old female with a history of seizures and poor medication compliance who presented on Friday with status epilepticus, which resolved with benzodiazepines and IV Keppra load.  - Exam today reveals resolution of postictal state noted on prior exams.   - CT head: Medial right parietal lobe encephalomalacia remains relatively subtle by CT (coronal image 44). Background brain volume remains normal. No superimposed hemorrhage or acute hypodensity.  - EEG while asleep only: Normal.  - Presentation is most consistent with breakthrough seizures secondary to medication noncompliance, evolving to status epilepticus that resolved after administration of 2.5 mg IV Versed by EMS. Ativan 4 mg IV was administered in the ED after arrival.  - The patient has not been taking her Keppra due to side effect of drowsiness, which prevents  her from working at her job effectively. Keppra has not been effective at controlling her seizures this admission. She previously has taken Depakote for migraine prophylaxis and is willing to be switched from Keppra to valproic acid for her anticonvulsant regimen. She was counseled that she should not become pregnant while on this medication; she states that she has no plans to become pregnant. She states that she will be compliant with the  TID valproic acid dosing. Husband also agrees with the plan. She is awake, alert and attentive enough to provide informed consent.     Recommendations: - Started on valproic acid yesterday. 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 She states that she intends to remain compliant with the TID valproic acid dosing regimen.  - Continue scheduled valproic acid at 500 mg TID. Switching to PO today. - Inpatient seizure precautions - Outpatient seizure precautions: Per [facility] statutes, patients with seizures are not allowed to drive until  they have been seizure-free for six months. Use caution when using heavy equipment or power tools. Avoid working on ladders or at heights. Take showers instead of baths. Ensure the water temperature is not too high on the home water heater. Do not go swimming alone. When caring for infants or small children, sit down when holding, feeding, or changing them to minimize risk of injury to the child in the event you have a seizure. Also, Maintain good sleep hygiene. Avoid alcohol.  - Most likely will be discharged today.  - Will need outpatient Neurology follow up in 2-4 weeks.    LOS: 1 day   @Electronically  signed: Dr. Caryl Pina 02/28/2023  8:24 AM

## 2023-02-28 NOTE — Progress Notes (Signed)
There was second assess for a PIV access. Attempted on Rt. Wrist area which was unsuccessful. Patient has unsuitable veins for PIV access by [place].  Dr. Chipper Herb didn't want to put in the Midline or PICC for anti-seizure medication and change from IVPB to po medication. Informed patient's RN regarding this matter. HS [employer]

## 2023-02-28 NOTE — Discharge Summary (Signed)
Physician Discharge Summary   Patient: [name] MRN: [id number] DOB: 1971/10/23  Admit date:     02/26/2023  Discharge date: 02/28/23  Discharge Physician: Marrion Coy   PCP: Corky Downs, MD   Recommendations at discharge:   Follow-up with PCP in 1 week Check Depakote level at next office visit. Follow-up with neurology in 1 months. Do not operate machinery or drive for 6 months until free from seizure.  Discharge Diagnoses: Principal Problem:   Seizure [facility]) Active Problems:   Hemiplegic migraine   Hx of cerebral infarction   Leukocytosis   Tobacco abuse   Obesity (BMI 30-39.9)  Resolved Problems:   * No resolved hospital problems. *  Hospital Course: [name] is a 52 y.o. female with medical history significant of seizure, hypertension, stroke, hemiplegic migraine, brain aneurysm, who presents with seizure  Patient has not been compliant with her seizure treatment, after arriving the hospital, she was loaded with Keppra, seen by neurology, EEG did not show any seizure activity. Patient had an additional 2 episodes of seizure in the morning of 5/4.  She was drowsy postictal. Patient is seen by neurology, medication was changed to Depakote and loaded with IV.  Then continued with oral.  Currently patient did not have any additional seizures since yesterday.  Discussed with neurology, medically stable to be discharged.  Assessment and Plan: Seizure [facility]): \ As above, condition has improved.  Patient is medically stable to be discharged.   Hemiplegic migraine Nurtec 75 mg every other day   Hx of cerebral infarction: Patient is not taking aspirin or statin currently Currently patient does not have recurrence of stroke.    Tobacco abuse Advised to quit.   Obesity (BMI 30-39.9): Body weight 105.4 kg, BMI 38.67 Diet and exercise will be encouraged.        Consultants: Neurology Procedures performed: None  Disposition: Home Diet recommendation:   Discharge Diet Orders (From admission, onward)     Start     Ordered   02/28/23 0000  Diet - low sodium heart healthy        02/28/23 1326           Cardiac diet DISCHARGE MEDICATION: Allergies as of 02/28/2023       Reactions   Amoxicillin Anaphylaxis   Fish Allergy Anaphylaxis   Morphine And Related Anaphylaxis   Penicillins Anaphylaxis   Has patient had a PCN reaction causing immediate rash, facial/tongue/throat swelling, SOB or lightheadedness with hypotension: Yes Has patient had a PCN reaction causing severe rash involving mucus membranes or skin necrosis: Yes Has patient had a PCN reaction that required hospitalization Yes Has patient had a PCN reaction occurring within the last 10 years: Yes If all of the above answers are "NO", then may proceed with Cephalosporin use.   Rocephin [ceftriaxone Sodium In Dextrose] Anaphylaxis   Shellfish Allergy Anaphylaxis, Itching   Tylenol [acetaminophen] Anaphylaxis   Topamax [topiramate] Nausea And Vomiting   Tramadol Other (See Comments)   Unknown per SO        Medication List     STOP taking these medications    levETIRAcetam 750 MG 24 hr tablet Commonly known as: KEPPRA XR       TAKE these medications    albuterol 108 (90 Base) MCG/ACT inhaler Commonly known as: VENTOLIN HFA Inhale 2 puffs into the lungs every 6 (six) hours as needed for wheezing or shortness of breath.   divalproex 500 MG DR tablet Commonly known as: DEPAKOTE  Take 1 tablet (500 mg total) by mouth every 8 (eight) hours.   ibuprofen 200 MG tablet Commonly known as: ADVIL Take 1 tablet (200 mg total) by mouth as needed (pain).   lisinopril 10 MG tablet Commonly known as: ZESTRIL Take 1 tablet (10 mg total) by mouth daily.   Nurtec 75 MG Tbdp Generic drug: Rimegepant Sulfate Take 75 mg by mouth every other day.        Follow-up Information     Corky Downs, MD Follow up in 1 week(s).   Specialties: Internal Medicine,  Cardiology Contact information: [address] [place] [address] [phone]         [facility] Follow up in 1 month(s).   Contact information: [address] [place] [address] [phone]               Discharge Exam: Ceasar Mons Weights   Mar 10, 2023 1205  Weight: 105.4 kg   General exam: Appears calm and comfortable, obese. Respiratory system: Clear to auscultation. Respiratory effort normal. Cardiovascular system: S1 & S2 heard, RRR. No JVD, murmurs, rubs, gallops or clicks. No pedal edema. Gastrointestinal system: Abdomen is nondistended, soft and nontender. No organomegaly or masses felt. Normal bowel sounds heard. Central nervous system: Alert and oriented. No focal neurological deficits. Extremities: Symmetric 5 x 5 power. Skin: No rashes, lesions or ulcers Psychiatry: Judgement and insight appear normal. Mood & affect appropriate.    Condition at discharge: good  The results of significant diagnostics from this hospitalization (including imaging, microbiology, ancillary and laboratory) are listed below for reference.   Imaging Studies: EEG adult  Result Date: Mar 10, 2023 Charlsie Quest, MD     03-10-2023  1:50 PM Patient Name: [name] MRN: [id number] Epilepsy Attending: Charlsie Quest Referring Physician/Provider: Concha Se, MD Date: 03/10/23 Duration: 25.19 mins Patient history: 52 y.o. female with history of seizures who comes in with concerns for seizure activity. EEG to evaluate for seizure Level of alertness: Asleep AEDs during EEG study: Ativan Technical aspects: This EEG study was done with scalp electrodes positioned according to the 10-20 International system of electrode placement. Electrical activity was reviewed with band pass filter of 1-70Hz , sensitivity of 7 uV/mm, display speed of 22mm/sec with a 60Hz  notched filter applied as appropriate. EEG data were recorded continuously and digitally  stored.  Video monitoring was available and reviewed as appropriate. Description: Sleep was characterized by vertex waves, sleep spindles (12 to 14 Hz), maximal frontocentral region.Hyperventilation and photic stimulation were not performed.   IMPRESSION: This study during sleep only is within normal limits. No seizures or epileptiform discharges were seen throughout the recording. A normal interictal EEG does not exclude the diagnosis of epilepsy. Charlsie Quest   CT Cervical Spine Wo Contrast  Result Date: 03/10/23 CLINICAL DATA:  52 year old female status post witnessed seizure activity. History of medial right parietal lobe encephalomalacia and hemosiderin. EXAM: CT CERVICAL SPINE WITHOUT CONTRAST TECHNIQUE: Multidetector CT imaging of the cervical spine was performed without intravenous contrast. Multiplanar CT image reconstructions were also generated. RADIATION DOSE REDUCTION: This exam was performed according to the departmental dose-optimization program which includes automated exposure control, adjustment of the mA and/or kV according to patient size and/or use of iterative reconstruction technique. COMPARISON:  Head CT today.  Cervical spine CT 12/19/2011. FINDINGS: Alignment: Chronic straightening, mild reversal of cervical lordosis today. Cervicothoracic junction alignment is within normal limits. Bilateral posterior element alignment is within normal limits. Skull base and  vertebrae: Bone mineralization is within normal limits. Visualized skull base is intact. No atlanto-occipital dissociation. C1 and C2 appear intact and aligned. No acute osseous abnormality identified. Soft tissues and spinal canal: No prevertebral fluid or swelling. No visible canal hematoma. Negative visible noncontrast neck soft tissues. Disc levels: Mild for age cervical spine degeneration. No CT evidence of cervical spinal stenosis. Upper chest: Negative. IMPRESSION: 1. No acute traumatic injury identified in the cervical  spine. 2. Mild for age cervical spine degeneration. Electronically Signed   By: Odessa Fleming M.D.   On: 02/26/2023 10:47   CT HEAD WO CONTRAST ( )  Result Date: 02/26/2023 CLINICAL DATA:  52 year old female status post witnessed seizure activity. History of medial right parietal lobe encephalomalacia and hemosiderin. EXAM: CT HEAD WITHOUT CONTRAST TECHNIQUE: Contiguous axial images were obtained from the base of the skull through the vertex without intravenous contrast. RADIATION DOSE REDUCTION: This exam was performed according to the departmental dose-optimization program which includes automated exposure control, adjustment of the mA and/or kV according to patient size and/or use of iterative reconstruction technique. COMPARISON:  Brain MRI 01/19/2021, Head CT 09/08/2022. FINDINGS: Brain: Medial right parietal lobe encephalomalacia remains relatively subtle by CT (coronal image 44). Background brain volume remains normal. No superimposed No midline shift, ventriculomegaly, mass effect, evidence of mass lesion, intracranial hemorrhage or evidence of cortically based acute infarction. Gray-white differentiation remains within normal limits except for the encephalomalacia. Vascular: No suspicious intracranial vascular hyperdensity. Skull: Intact, negative. Sinuses/Orbits: Visualized paranasal sinuses and mastoids are stable and well aerated. Other: Visualized orbits and scalp soft tissues are within normal limits. IMPRESSION: No acute intracranial abnormality. Stable non contrast CT appearance of the brain, negative aside from the chronic medial right parietal lobe encephalomalacia. Electronically Signed   By: Odessa Fleming M.D.   On: 02/26/2023 10:45   DG Chest Portable 1 View  Result Date: 02/26/2023 CLINICAL DATA:  Seizures. EXAM: PORTABLE CHEST 1 VIEW COMPARISON:  09/08/2022 and prior studies. FINDINGS: Cardiac silhouette is normal in size. Normal mediastinal and hilar contours. Clear lungs.  No pleural effusion or  pneumothorax. Skeletal structures are grossly intact. IMPRESSION: No active disease. Electronically Signed   By: Amie Portland M.D.   On: 02/26/2023 10:10    Microbiology: Results for orders placed or performed during the hospital encounter of 09/08/22  Culture, blood (Routine X 2) w Reflex to ID Panel     Status: None   Collection Time: 09/08/22  6:55 PM   Specimen: BLOOD  Result Value Ref Range Status   Specimen Description BLOOD RIGHT ANTECUBITAL  Final   Special Requests   Final    BOTTLES DRAWN AEROBIC AND ANAEROBIC Blood Culture adequate volume   Culture   Final    NO GROWTH 5 DAYS Performed at [facility], [address], [place], [place] [address]    Report Status 09/13/2022 FINAL  Final  Urine Culture     Status: None   Collection Time: 09/08/22  9:56 PM   Specimen: Urine, Random  Result Value Ref Range Status   Specimen Description   Final    URINE, RANDOM Performed at [facility], [address] [place], [place] [address]    Special Requests   Final    NONE Performed at [facility], [address] [place], [place] [address]    Culture   Final    NO GROWTH Performed at [facility], [address], [place], [place] [address]    Report  Status 09/10/2022 FINAL  Final    Labs: CBC: Recent Labs  Lab 02/26/23 0926 02/27/23 0402  WBC 12.5* 16.5*  NEUTROABS 7.4  --   HGB 13.1 11.8*  HCT 41.8 37.5  MCV 82.9 82.4  PLT 375 320   Basic Metabolic Panel: Recent Labs  Lab 02/26/23 0926  NA 143  K 3.7  CL 108  CO2 26  GLUCOSE 107*  BUN 12  CREATININE 0.72  CALCIUM 9.4   Liver Function Tests: Recent Labs  Lab 02/26/23 0926  AST 22  ALT 19  ALKPHOS 83  BILITOT 0.2*  PROT 7.5  ALBUMIN 3.8   CBG: Recent Labs  Lab 02/26/23 2101  GLUCAP 107*    Discharge time spent: greater than 30 minutes.  Signed: Marrion Coy, MD [facility] 02/28/2023

## 2023-02-28 NOTE — Discharge Instructions (Signed)
Do not drive or operate machinery until 6 months free of seizure. Follow-up with PCP in 1 week. Follow-up with neurology in 1 month.

## 2023-03-01 LAB — LEVETIRACETAM LEVEL: Levetiracetam Lvl: 10.4 ug/mL (ref 10.0–40.0)

## 2023-04-21 ENCOUNTER — Encounter: Payer: Self-pay | Admitting: Adult Health

## 2023-04-21 ENCOUNTER — Ambulatory Visit: Payer: Commercial Managed Care - PPO | Admitting: Adult Health

## 2023-04-21 VITALS — BP 123/72 | HR 73

## 2023-04-21 DIAGNOSIS — G43E11 Chronic migraine with aura, intractable, with status migrainosus: Secondary | ICD-10-CM

## 2023-04-21 DIAGNOSIS — R569 Unspecified convulsions: Secondary | ICD-10-CM | POA: Diagnosis not present

## 2023-04-21 DIAGNOSIS — Z5181 Encounter for therapeutic drug level monitoring: Secondary | ICD-10-CM | POA: Diagnosis not present

## 2023-04-21 MED ORDER — DIVALPROEX SODIUM ER 500 MG PO TB24: 1000.0000 mg | ORAL_TABLET | Freq: Every day | ORAL | 3 refills | Status: DC
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
## 2023-04-21 NOTE — Patient Instructions (Addendum)
Your Plan:  Will switch Depakote for DR to ER. You will continue taking 2 tablets at bedtime Blood work today  If neck/ head discomfort worsens then let us know If your symptoms worsen or you develop new symptoms please let us know.   Thank you for coming to see [place] at [facility]. I hope we have been able to provide you high quality care today.  You may receive a patient satisfaction survey over the next few weeks. We would appreciate your feedback and comments so that we may continue to improve ourselves and the health of our patients.

## 2023-06-06 ENCOUNTER — Emergency Department: Payer: Commercial Managed Care - PPO

## 2023-06-06 ENCOUNTER — Emergency Department
Admission: EM | Admit: 2023-06-06 | Discharge: 2023-06-06 | Disposition: A | Discharge status: Home / Self Care | Payer: Commercial Managed Care - PPO | Source: Home / Self Care | Attending: Emergency Medicine | Admitting: Emergency Medicine

## 2023-06-06 DIAGNOSIS — Z79899 Other long term (current) drug therapy: Secondary | ICD-10-CM | POA: Diagnosis not present

## 2023-06-06 DIAGNOSIS — R569 Unspecified convulsions: Secondary | ICD-10-CM | POA: Diagnosis present

## 2023-06-06 DIAGNOSIS — I10 Essential (primary) hypertension: Secondary | ICD-10-CM | POA: Diagnosis not present

## 2023-06-06 DIAGNOSIS — F445 Conversion disorder with seizures or convulsions: Secondary | ICD-10-CM | POA: Diagnosis not present

## 2023-06-06 LAB — CBC WITH DIFFERENTIAL/PLATELET
Abs Immature Granulocytes: 0.06 10*3/uL (ref 0.00–0.07)
Basophils Absolute: 0.1 10*3/uL (ref 0.0–0.1)
Basophils Relative: 1 %
Eosinophils Absolute: 0.1 10*3/uL (ref 0.0–0.5)
Eosinophils Relative: 1 %
HCT: 40.2 % (ref 36.0–46.0)
Hemoglobin: 12.8 g/dL (ref 12.0–15.0)
Immature Granulocytes: 0 %
Lymphocytes Relative: 27 %
Lymphs Abs: 3.8 10*3/uL (ref 0.7–4.0)
MCH: 26.6 pg (ref 26.0–34.0)
MCHC: 31.8 g/dL (ref 30.0–36.0)
MCV: 83.6 fL (ref 80.0–100.0)
Monocytes Absolute: 0.6 10*3/uL (ref 0.1–1.0)
Monocytes Relative: 4 %
Neutro Abs: 9.6 10*3/uL — ABNORMAL HIGH (ref 1.7–7.7)
Neutrophils Relative %: 67 %
Platelets: 320 10*3/uL (ref 150–400)
RBC: 4.81 MIL/uL (ref 3.87–5.11)
RDW: 14.6 % (ref 11.5–15.5)
WBC: 14.3 10*3/uL — ABNORMAL HIGH (ref 4.0–10.5)
nRBC: 0 % (ref 0.0–0.2)

## 2023-06-06 LAB — COMPREHENSIVE METABOLIC PANEL
ALT: 20 U/L (ref 0–44)
AST: 23 U/L (ref 15–41)
Albumin: 3.8 g/dL (ref 3.5–5.0)
Alkaline Phosphatase: 78 U/L (ref 38–126)
Anion gap: 12 (ref 5–15)
BUN: 15 mg/dL (ref 6–20)
CO2: 20 mmol/L — ABNORMAL LOW (ref 22–32)
Calcium: 9 mg/dL (ref 8.9–10.3)
Chloride: 111 mmol/L (ref 98–111)
Creatinine, Ser: 0.78 mg/dL (ref 0.44–1.00)
GFR, Estimated: >60 mL/min (ref >60)
Glucose, Bld: 113 mg/dL — ABNORMAL HIGH (ref 70–99)
Potassium: 2.8 mmol/L — ABNORMAL LOW (ref 3.5–5.1)
Sodium: 143 mmol/L (ref 135–145)
Total Bilirubin: 0.6 mg/dL (ref 0.3–1.2)
Total Protein: 7.4 g/dL (ref 6.5–8.1)

## 2023-06-06 LAB — VALPROIC ACID LEVEL: Valproic Acid Lvl: <10 ug/mL — ABNORMAL LOW (ref 50.0–100.0)

## 2023-06-06 LAB — MAGNESIUM: Magnesium: 2 mg/dL (ref 1.7–2.4)

## 2023-06-06 LAB — ETHANOL: Alcohol, Ethyl (B): 44 mg/dL — ABNORMAL HIGH (ref <10)

## 2023-06-06 LAB — CBG MONITORING, ED: Glucose-Capillary: 107 mg/dL — ABNORMAL HIGH (ref 70–99)

## 2023-06-06 MED ORDER — METOCLOPRAMIDE HCL 5 MG/ML IJ SOLN
10.0000 mg | Freq: Once | INTRAMUSCULAR | Status: AC
  Administered 2023-06-06: 10 mg via INTRAVENOUS
  Filled 2023-06-06: qty 2

## 2023-06-06 MED ORDER — SODIUM CHLORIDE 0.9 % IV SOLN: INTRAVENOUS | Status: DC

## 2023-06-06 MED ORDER — DIVALPROEX SODIUM ER 500 MG PO TB24: 1000.0000 mg | ORAL_TABLET | Freq: Every day | ORAL | 3 refills | Status: DC

## 2023-06-06 MED ORDER — VALPROATE SODIUM 100 MG/ML IV SOLN
500.0000 mg | Freq: Once | INTRAVENOUS | Status: AC
  Administered 2023-06-06: 500 mg via INTRAVENOUS
  Filled 2023-06-06: qty 5

## 2023-06-06 MED ORDER — LEVETIRACETAM IN NACL 1000 MG/100ML IV SOLN
1000.0000 mg | Freq: Once | INTRAVENOUS | Status: AC
  Administered 2023-06-06: 1000 mg via INTRAVENOUS
  Filled 2023-06-06: qty 100

## 2023-06-06 MED ORDER — SODIUM CHLORIDE 0.9 % IV BOLUS
1000.0000 mL | Freq: Once | INTRAVENOUS | Status: AC
  Administered 2023-06-06: 1000 mL via INTRAVENOUS

## 2023-06-06 MED ORDER — POTASSIUM CHLORIDE 10 MEQ/100ML IV SOLN
10.0000 meq | INTRAVENOUS | Status: AC
  Administered 2023-06-06 (×2): 10 meq via INTRAVENOUS
  Filled 2023-06-06 (×2): qty 100

## 2023-06-06 NOTE — ED Provider Notes (Signed)
-----------------------------------------   9:13 AM on 06/06/2023 ----------------------------------------- Patient has been able to get up use the bathroom.  States she is ready to go home.  Will discharge the patient.   Minna Antis, MD 06/06/23 [phone]

## 2023-06-06 NOTE — ED Triage Notes (Signed)
Patient BIB [facility] from home for seizures. Patients family reports patient had 2 seizures at home and that she hit her head on something in the bathroom. EMS report that patient had 3 seizure with them each lasting 10-15 seconds long. EMS gave 2.5 versed IM at 0314.  Patient has a history of seizures. Daughter reports  patient was on a vent about a month ago for seizures.  BG 137.   During triage patient had a seizure  that lasted 10 second.Not postictal after. MD present. Versed 2.5 given IM by EMS.

## 2023-06-06 NOTE — ED Provider Notes (Signed)
[facility] Provider Note    Event Date/Time   First MD Initiated Contact with Patient 06/06/23 [phone]     (approximate)   History   Seizures   HPI  [name] is a 52 y.o. female with history of epileptic seizures, nonepileptic seizures, medical noncompliance, migraines, ischemic CVA with left-sided hemiparesis who presents emergency department EMS for multiple witnessed seizures at home and with EMS.  Patient's family and EMS reports no postictal state, incontinence or tongue biting.  EMS did give 2.5 mg IM Versed and route but patient continued to have episodes.  She reports noncompliance with her Depakote.  Family at bedside states that these episodes today are consistent with her pseudoseizures.  Patient complaining of headache which is normal for her after a seizure.   History provided by patient, family, EMS.    Past Medical History:  Diagnosis Date   Acute respiratory failure (HCC)    Brain aneurysm    Common migraine with intractable migraine 03/03/2017   Headache    Hemiplegic migraine 10/07/2015   Late effects of cerebral ischemic stroke 02/09/2018   Left hemiparesis   Primary hypertension 12/06/2020   Seizures (HCC)     Past Surgical History:  Procedure Laterality Date   ABDOMINAL HYSTERECTOMY     COLONOSCOPY WITH PROPOFOL N/A 09/01/2022   Procedure: COLONOSCOPY WITH PROPOFOL;  Surgeon: Wyline Mood, MD;  Location: [facility] ENDOSCOPY;  Service: Gastroenterology;  Laterality: N/A;    MEDICATIONS:  Prior to Admission medications   Medication Sig Start Date End Date Taking? Authorizing Provider  albuterol (VENTOLIN HFA) 108 (90 Base) MCG/ACT inhaler Inhale 2 puffs into the lungs every 6 (six) hours as needed for wheezing or shortness of breath. Patient not taking: Reported on 02/26/2023 07/27/22   Corky Downs, MD  divalproex (DEPAKOTE ER) 500 MG 24 hr tablet Take 2 tablets (1,000 mg total) by mouth daily. 04/21/23   Butch Penny, NP   ibuprofen (ADVIL) 200 MG tablet Take 1 tablet (200 mg total) by mouth as needed (pain). 07/27/22   Corky Downs, MD  lisinopril (ZESTRIL) 10 MG tablet Take 1 tablet (10 mg total) by mouth daily. 09/21/22   Corky Downs, MD  metoprolol succinate (TOPROL-XL) 50 MG 24 hr tablet Take 50 mg by mouth daily. 03/12/23   [provider]  Rimegepant Sulfate (NURTEC) 75 MG TBDP Take 75 mg by mouth every other day. 10/06/22   Suanne Marker, MD    Physical Exam   Triage Vital Signs: ED Triage Vitals  Encounter Vitals Group     BP 06/06/23 0324 (!) 124/90     Systolic BP Percentile --      Diastolic BP Percentile --      Pulse Rate 06/06/23 0324 93     Resp 06/06/23 0324 19     Temp 06/06/23 0335 98.4 F (36.9 C)     Temp Source 06/06/23 0335 Axillary     SpO2 06/06/23 0324 99 %     Weight 06/06/23 0336 232 lb 5.8 oz (105.4 kg)     Height 06/06/23 0336 5\' 5"  (1.651 m)     Head Circumference --      Peak Flow --      Pain Score --      Pain Loc --      Pain Education --      Exclude from Growth Chart --     Most recent vital signs: Vitals:   06/06/23 0719 06/06/23 0730  BP:  (!) 95/54  Pulse:  73  Resp: 18   Temp: 97.8 F (36.6 C)   SpO2:  100%    CONSTITUTIONAL: Alert, responds appropriately to questions.  Soft-spoken but will answer questions appropriately.  Alert and oriented x 3. HEAD: Normocephalic, atraumatic EYES: Conjunctivae clear, pupils appear equal, sclera nonicteric ENT: normal nose; moist mucous membranes NECK: Supple, normal ROM CARD: RRR; S1 and S2 appreciated RESP: Normal chest excursion without splinting or tachypnea; breath sounds clear and equal bilaterally; no wheezes, no rhonchi, no rales, no hypoxia or respiratory distress, speaking full sentences ABD/GI: Non-distended; soft, non-tender, no rebound, no guarding, no peritoneal signs BACK: The back appears normal EXT: Normal ROM in all joints; no deformity noted, no edema SKIN: Normal color  for age and race; warm; no rash on exposed skin NEURO: Moves all extremities equally, normal speech, normal sensation, normal grip strength, no facial asymmetry  PSYCH: The patient's mood and manner are appropriate.   ED Results / Procedures / Treatments   LABS: (all labs ordered are listed, but only abnormal results are displayed) Labs Reviewed  COMPREHENSIVE METABOLIC PANEL - Abnormal; Notable for the following components:      Result Value   Potassium 2.8 (*)    CO2 20 (*)    Glucose, Bld 113 (*)    All other components within normal limits  CBC WITH DIFFERENTIAL/PLATELET - Abnormal; Notable for the following components:   WBC 14.3 (*)    Neutro Abs 9.6 (*)    All other components within normal limits  ETHANOL - Abnormal; Notable for the following components:   Alcohol, Ethyl (B) 44 (*)    All other components within normal limits  VALPROIC ACID LEVEL - Abnormal; Notable for the following components:   Valproic Acid Lvl <10 (*)    All other components within normal limits  CBG MONITORING, ED - Abnormal; Notable for the following components:   Glucose-Capillary 107 (*)    All other components within normal limits  MAGNESIUM  URINALYSIS, ROUTINE W REFLEX MICROSCOPIC  URINE DRUG SCREEN, QUALITATIVE [facility] ONLY)     EKG:  EKG Interpretation Date/Time:  Sunday June 06 2023 04:25:42 EDT Ventricular Rate:  87 PR Interval:  176 QRS Duration:  106 QT Interval:  402 QTC Calculation: 484 R Axis:   3  Text Interpretation: Sinus rhythm Confirmed by Rochele Raring [phone]) on 06/06/2023 4:33:11 AM         RADIOLOGY: My personal review and interpretation of imaging: CT head unremarkable.  I have personally reviewed all radiology reports.   CT HEAD WO CONTRAST ( )  Result Date: 06/06/2023 CLINICAL DATA:  52 year old female status post 2 seizures at home. Struck head in bathroom. EXAM: CT HEAD WITHOUT CONTRAST TECHNIQUE: Contiguous axial images were obtained from the base  of the skull through the vertex without intravenous contrast. RADIATION DOSE REDUCTION: This exam was performed according to the departmental dose-optimization program which includes automated exposure control, adjustment of the mA and/or kV according to patient size and/or use of iterative reconstruction technique. COMPARISON:  Brain MRI 12/30/2020.  Head CT 02/26/2023 and earlier. FINDINGS: Brain: Chronic splenium and right parietal lobe hemosiderin and encephalomalacia is more apparent by MRI. Mild chronic splenium volume loss by CT (series 5, image 28). Background brain volume remains normal. No midline shift, ventriculomegaly, mass effect, evidence of mass lesion, intracranial hemorrhage or evidence of cortically based acute infarction. Gray-white matter differentiation is within normal limits throughout the brain. *CRASH* that outside of the  chronic encephalomalacia gray-white matter differentiation appears stable and normal. Vascular: No suspicious intracranial vascular hyperdensity. Skull: No fracture identified. Sinuses/Orbits: Visualized paranasal sinuses and mastoids are stable and well aerated. Other: No discrete orbit or scalp soft tissue injury identified. IMPRESSION: 1. No acute intracranial abnormality or acute traumatic injury identified. 2. Chronic splenium and right parietal lobe encephalomalacia with hemosiderin. Otherwise stable and negative noncontrast CT appearance of the brain. Electronically Signed   By: Odessa Fleming M.D.   On: 06/06/2023 04:33     PROCEDURES:  Critical Care performed: No    .1-3 Lead EKG Interpretation  Performed by: Aleisha Paone, Layla Maw, DO Authorized by: Xaviera Flaten, Layla Maw, DO     Interpretation: normal     ECG rate:  73   ECG rate assessment: normal     Rhythm: sinus rhythm     Ectopy: none     Conduction: normal       IMPRESSION / MDM / ASSESSMENT AND PLAN / ED COURSE  I reviewed the triage vital signs and the nursing notes.    Patient here with what  appears to be pseudoseizures.  She will have shaking of her legs that then goes into violent shaking of her head that lasts for about 10 to 15 seconds.  She immediately comes back to and is able to talk.  Family at bedside states that this is consistent with her pseudoseizure history.  The patient is on the cardiac monitor to evaluate for evidence of arrhythmia and/or significant heart rate changes.   DIFFERENTIAL DIAGNOSIS (includes but not limited to):   Pseudoseizures, anxiety, depression, no signs of status epilepticus   Patient's presentation is most consistent with severe exacerbation of chronic illness.   PLAN: Will obtain labs, urine.  Will load with antiepileptics given she does have a true history of epileptic seizures although these episodes today are consistent with nonepileptic seizures.  Will continue to monitor.  Blood glucose normal with EMS.   MEDICATIONS GIVEN IN ED: Medications  sodium chloride 0.9 % bolus 1,000 mL (0 mLs Intravenous Stopped 06/06/23 0455)    And  0.9 %  sodium chloride infusion ( Intravenous New Bag/Given 06/06/23 0457)  levETIRAcetam (KEPPRA) IVPB 1000 mg/100 mL premix (0 mg Intravenous Stopped 06/06/23 0359)  metoCLOPramide (REGLAN) injection 10 mg (10 mg Intravenous Given 06/06/23 0346)  valproate (DEPACON) 500 mg in dextrose 5 % 50 mL IVPB (0 mg Intravenous Stopped 06/06/23 0652)  potassium chloride 10 mEq in 100 mL IVPB (0 mEq Intravenous Stopped 06/06/23 0717)     ED COURSE: Patient continued to have these episodes and required another dose of 2.5 mg of IM Versed to help keep her calm.  She did receive IV Keppra as well as a loading dose of IV Depacon given her Depakote level is 0.  Her blood glucose today is normal.  Potassium slightly low at 2.8.  Will give IV replacement.  No EKG changes.  Will add on magnesium level.  Alcohol level is 44.  Patient has a leukocytosis but this appears chronic for her.   7:45 AM  Magnesium level normal.  Patient  continues to be stable and has not had any episodes for several hours per family.  She is still drowsy but arousable.  Will discharge once more awake with her daughters.  They are comfortable with this plan.  Signed out to oncoming EDP.   CONSULTS: None   OUTSIDE RECORDS REVIEWED: Reviewed previous neurology notes.       FINAL CLINICAL  IMPRESSION(S) / ED DIAGNOSES   Final diagnoses:  Psychogenic nonepileptic seizure     Rx / DC Orders   ED Discharge Orders          Ordered    divalproex (DEPAKOTE ER) 500 MG 24 hr tablet  Daily        06/06/23 0745             Note:  This document was prepared using Dragon voice recognition software and may include unintentional dictation errors.   Damien Batty, Layla Maw, DO 06/06/23 [phone]

## 2023-06-06 NOTE — ED Notes (Signed)
CT reports " patient had two seizures." Provider notified

## 2023-06-06 NOTE — Discharge Instructions (Signed)
Please take your Depakote as prescribed.  Please follow-up with your neurologist.

## 2023-07-07 ENCOUNTER — Telehealth: Payer: Self-pay | Admitting: *Deleted

## 2023-07-07 DIAGNOSIS — Z5181 Encounter for therapeutic drug level monitoring: Secondary | ICD-10-CM

## 2023-07-07 NOTE — Telephone Encounter (Signed)
I tried to call the patient but her voicemail was full.  I was able to reach her husband [name] (on [place]) and I asked him about the blood work.  He states he will give her a call and see.  I did ask for a call back and also provided the lab hours for this week. I also know the patient's voicemail was full. He verbalized appreciation for the call.

## 2023-07-07 NOTE — Telephone Encounter (Signed)
-----   Message from Butch Penny sent at 07/05/2023 11:33 AM EDT ----- Can we check with patient to see if she plans to get blood work.  This was ordered during her office visit and was not collected ----- Message ----- From: SYSTEM Sent: 04/26/2023  12:16 AM EDT To: Butch Penny, NP

## 2023-07-09 ENCOUNTER — Other Ambulatory Visit (INDEPENDENT_AMBULATORY_CARE_PROVIDER_SITE_OTHER): Payer: Self-pay

## 2023-07-09 DIAGNOSIS — Z0289 Encounter for other administrative examinations: Secondary | ICD-10-CM

## 2023-07-13 ENCOUNTER — Encounter: Payer: Self-pay | Admitting: Emergency Medicine

## 2023-07-13 ENCOUNTER — Other Ambulatory Visit: Payer: Self-pay

## 2023-07-13 ENCOUNTER — Emergency Department
Admission: EM | Admit: 2023-07-13 | Discharge: 2023-07-13 | Disposition: A | Discharge status: Home / Self Care | Payer: Commercial Managed Care - PPO | Attending: Emergency Medicine | Admitting: Emergency Medicine

## 2023-07-13 DIAGNOSIS — J069 Acute upper respiratory infection, unspecified: Secondary | ICD-10-CM | POA: Insufficient documentation

## 2023-07-13 DIAGNOSIS — Z20822 Contact with and (suspected) exposure to covid-19: Secondary | ICD-10-CM | POA: Diagnosis not present

## 2023-07-13 DIAGNOSIS — R059 Cough, unspecified: Secondary | ICD-10-CM | POA: Diagnosis present

## 2023-07-13 LAB — SARS CORONAVIRUS 2 BY RT PCR: SARS Coronavirus 2 by RT PCR: NEGATIVE

## 2023-07-13 NOTE — ED Notes (Signed)
Patient discharged to home per MD order. Patient in stable condition, and deemed medically cleared by ED provider for discharge. Discharge instructions reviewed with patient/family using "Teach Back"; verbalized understanding of medication education and administration, and information about follow-up care. Denies further concerns. ° °

## 2023-07-13 NOTE — Discharge Instructions (Signed)
Follow-up with your primary care provider if any continued problems or concerns.  Take ibuprofen as needed for fever, body aches, headache.  Increase fluids to stay hydrated.

## 2023-07-13 NOTE — ED Notes (Signed)
See triage note  Presents with body aches ,headache and some n/v  Sx's started yesterday  Afebrile on arrival

## 2023-07-13 NOTE — ED Triage Notes (Signed)
Pt here with a cough, sneezing, and chills x1 week. 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 Pt ambulatory to triage.

## 2023-07-13 NOTE — ED Provider Notes (Signed)
[facility] Provider Note    Event Date/Time   First MD Initiated Contact with Patient 07/13/23 [phone]     (approximate)   History   Cough and Chills   HPI  [name] is a 52 y.o. female presents to the ED with complaint of cough, sneezing, chills for 1 week.  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  Tested twice with negative results.  Patient has history of epilepsy, prediabetes, headaches, CVA, hypertension and neck pain.     Physical Exam   Triage Vital Signs: ED Triage Vitals  Encounter Vitals Group     BP 07/13/23 0850 130/75     Systolic BP Percentile --      Diastolic BP Percentile --      Pulse Rate 07/13/23 0849 76     Resp 07/13/23 0849 18     Temp 07/13/23 0849 97.8 F (36.6 C)     Temp Source 07/13/23 0849 Oral     SpO2 07/13/23 0849 98 %     Weight 07/13/23 0849 232 lb 5.8 oz (105.4 kg)     Height 07/13/23 0849 5\' 5"  (1.651 m)     Head Circumference --      Peak Flow --      Pain Score 07/13/23 0849 10     Pain Loc --      Pain Education --      Exclude from Growth Chart --     Most recent vital signs: Vitals:   07/13/23 0849 07/13/23 0850  BP:  130/75  Pulse: 76   Resp: 18   Temp: 97.8 F (36.6 C)   SpO2: 98%      General: Awake, no distress.  CV:  Good peripheral perfusion.  Heart rate and rate and rhythm. Resp:  Normal effort.  Lungs are clear bilaterally. Abd:  No distention.  Other:     ED Results / Procedures / Treatments   Labs (all labs ordered are listed, but only abnormal results are displayed) Labs Reviewed  SARS CORONAVIRUS 2 BY RT PCR     EKG  Vent. rate 73 BPM PR interval 160 ms QRS duration 92 ms QT/QTcB 396/436 ms P-R-T axes 34 13 12 Normal sinus rhythm Normal ECG When compared with ECG of 06-Jun-2023 04:25,   PROCEDURES:  Critical Care performed:   Procedures   MEDICATIONS ORDERED IN ED: Medications - No data to  display   IMPRESSION / MDM / ASSESSMENT AND PLAN / ED COURSE  I reviewed the triage vital signs and the nursing notes.   Differential diagnosis includes, but is not limited to, COVID, viral illness, upper respiratory infection.  52 year old female presents to the ED with complaint of cough, congestion and feeling feverish.  Patient works in a healthcare setting.  COVID test was negative and patient was made aware.  She was encouraged to drink fluids and take ibuprofen as needed for symptoms.  A note was given for her to remain out of work for several days and to follow-up with her PCP if any continued problems.      Patient's presentation is most consistent with acute complicated illness / injury requiring diagnostic workup.  FINAL CLINICAL IMPRESSION(S) / ED DIAGNOSES   Final diagnoses:  Viral URI with cough     Rx / DC Orders   ED Discharge Orders     None        Note:  This  document was prepared using Conservation officer, historic buildings and may include unintentional dictation errors.   Tommi Rumps, PA-C 07/13/23 1103    Sharman Cheek, MD 07/13/23 [phone]

## 2023-07-13 NOTE — Telephone Encounter (Signed)
I was getting ready to follow back up on this but I see the patient is in the ER right now.  I looked further and it appears as though she did have a valproic acid level checked by another provider in August after she was seen here.

## 2023-07-14 NOTE — Telephone Encounter (Signed)
I spoke with the patient.  When labs were taken at the hospital back in August for seizure, she admitted to missing some doses because she was sick and vomiting but she said she was still taking it some.  She reports that since then, she has taken it every day, twice daily.  She states that she came by the office last Friday but lab was unable to get blood even after hydrating. She is currently sick with an upper respiratory virus but we discussed her coming in maybe next week when she feels better for a redraw.

## 2023-07-15 NOTE — Telephone Encounter (Signed)
That's fine

## 2023-07-15 NOTE — Addendum Note (Signed)
Addended by: Bertram Savin on: 07/15/2023 10:56 AM   Modules accepted: Orders

## 2023-07-15 NOTE — Telephone Encounter (Signed)
Spoke with patient. She normally takes her AM dose around 4 before she goes to work at 530 and then she takes it again at 5 pm after she gets out of work which is around 3. The patient will come in for labs next week around 4-430 for a trough level. She isn't quite sure which day she can come (Monday-Thursday).    Lab order placed

## 2023-10-28 ENCOUNTER — Other Ambulatory Visit: Payer: Self-pay | Admitting: Diagnostic Neuroimaging

## 2023-11-01 ENCOUNTER — Ambulatory Visit: Payer: Commercial Managed Care - PPO | Admitting: Diagnostic Neuroimaging

## 2023-11-01 ENCOUNTER — Encounter: Payer: Self-pay | Admitting: Diagnostic Neuroimaging

## 2023-11-01 VITALS — BP 133/82 | HR 82 | Ht 64.0 in | Wt 219.8 lb

## 2023-11-01 DIAGNOSIS — G40909 Epilepsy, unspecified, not intractable, without status epilepticus: Secondary | ICD-10-CM

## 2023-11-01 DIAGNOSIS — G43E11 Chronic migraine with aura, intractable, with status migrainosus: Secondary | ICD-10-CM | POA: Diagnosis not present

## 2023-11-01 MED ORDER — NURTEC 75 MG PO TBDP: 75.0000 mg | ORAL_TABLET | Freq: Every day | ORAL | 6 refills | Status: AC | PRN

## 2023-11-01 MED ORDER — DIVALPROEX SODIUM ER 500 MG PO TB24: 1000.0000 mg | ORAL_TABLET | Freq: Every day | ORAL | 3 refills | Status: AC

## 2023-11-01 MED ORDER — QULIPTA 60 MG PO TABS: 60.0000 mg | ORAL_TABLET | Freq: Every day | ORAL | 12 refills | Status: AC

## 2023-11-01 NOTE — Patient Instructions (Signed)
 SEIZURE DISORDER (due to prior intracerebral hemorrhage in 2009; last seizure 06/06/23)  - continue divalproex  1000mg  daily  - According to [place] law, you can not drive unless you are seizure / syncope free for at least 6 months and under physician's care.   - Please maintain precautions. Do not participate in activities where a loss of awareness could harm you or someone else. No swimming alone, no tub bathing, no hot tubs, no driving, no operating motorized vehicles (cars, ATVs, motocycles, etc), lawnmowers, power tools or firearms. No standing at heights, such as rooftops, ladders or stairs. Avoid hot objects such as stoves, heaters, open fires. Wear a helmet when riding a bicycle, scooter, skateboard, etc. and avoid areas of traffic. Set your water heater to 120 degrees or less.   MIGRAINE WITH AURA (chronic migraine; > 25 days per month) - start qulipta  60mg  daily for prevention - change nurtec to as needed for migraine rescue - meds tried --> failed ajovy , zonegran , botox , topiramate ; cannot take triptan due to intracerebral hemorrhage
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
## 2023-12-02 ENCOUNTER — Other Ambulatory Visit (HOSPITAL_COMMUNITY): Payer: Self-pay

## 2023-12-02 ENCOUNTER — Telehealth: Payer: Self-pay

## 2023-12-02 NOTE — Telephone Encounter (Signed)
*  [facility]  Pharmacy Patient Advocate Encounter  Received notification from [facility] that Prior Authorization for Nurtec 75MG  dispersible tablets  has been APPROVED from 12/02/2023 to 12/01/2024. Unable to obtain price due to refill too soon rejection, last fill date 11/25/2023 next available fill date02/07/2024   PA #/Case ID/Reference #: [id number]

## 2024-05-16 ENCOUNTER — Telehealth: Payer: Commercial Managed Care - PPO | Admitting: Adult Health
